# Patient Record
Sex: Female | Born: 1945
Health system: Southern US, Community
[De-identification: ages and names within clinical notes are randomized; demographics above are authoritative.]

## PROBLEM LIST (undated history)

## (undated) DIAGNOSIS — H04129 Dry eye syndrome of unspecified lacrimal gland: Secondary | ICD-10-CM

## (undated) DIAGNOSIS — E063 Autoimmune thyroiditis: Secondary | ICD-10-CM

## (undated) DIAGNOSIS — M19012 Primary osteoarthritis, left shoulder: Secondary | ICD-10-CM

## (undated) DIAGNOSIS — T8859XA Other complications of anesthesia, initial encounter: Secondary | ICD-10-CM

## (undated) DIAGNOSIS — Z5189 Encounter for other specified aftercare: Secondary | ICD-10-CM

## (undated) DIAGNOSIS — Q211 Atrial septal defect: Secondary | ICD-10-CM

## (undated) DIAGNOSIS — Z8679 Personal history of other diseases of the circulatory system: Secondary | ICD-10-CM

## (undated) DIAGNOSIS — F329 Major depressive disorder, single episode, unspecified: Secondary | ICD-10-CM

## (undated) DIAGNOSIS — H04123 Dry eye syndrome of bilateral lacrimal glands: Secondary | ICD-10-CM

## (undated) DIAGNOSIS — K5730 Diverticulosis of large intestine without perforation or abscess without bleeding: Secondary | ICD-10-CM

## (undated) DIAGNOSIS — F411 Generalized anxiety disorder: Secondary | ICD-10-CM

## (undated) DIAGNOSIS — Q219 Congenital malformation of cardiac septum, unspecified: Secondary | ICD-10-CM

## (undated) DIAGNOSIS — M5010 Cervical disc disorder with radiculopathy, unspecified cervical region: Secondary | ICD-10-CM

## (undated) DIAGNOSIS — Z973 Presence of spectacles and contact lenses: Secondary | ICD-10-CM

## (undated) DIAGNOSIS — J3089 Other allergic rhinitis: Secondary | ICD-10-CM

## (undated) DIAGNOSIS — Q2112 Patent foramen ovale: Secondary | ICD-10-CM

## (undated) DIAGNOSIS — R6889 Other general symptoms and signs: Principal | ICD-10-CM

## (undated) DIAGNOSIS — M7542 Impingement syndrome of left shoulder: Secondary | ICD-10-CM

## (undated) DIAGNOSIS — K635 Polyp of colon: Secondary | ICD-10-CM

## (undated) DIAGNOSIS — R21 Rash and other nonspecific skin eruption: Secondary | ICD-10-CM

## (undated) DIAGNOSIS — R7303 Prediabetes: Secondary | ICD-10-CM

## (undated) DIAGNOSIS — T7840XA Allergy, unspecified, initial encounter: Secondary | ICD-10-CM

## (undated) DIAGNOSIS — E039 Hypothyroidism, unspecified: Secondary | ICD-10-CM

## (undated) DIAGNOSIS — M75102 Unspecified rotator cuff tear or rupture of left shoulder, not specified as traumatic: Secondary | ICD-10-CM

## (undated) DIAGNOSIS — J45909 Unspecified asthma, uncomplicated: Secondary | ICD-10-CM

## (undated) DIAGNOSIS — E161 Other hypoglycemia: Secondary | ICD-10-CM

## (undated) DIAGNOSIS — M797 Fibromyalgia: Secondary | ICD-10-CM

## (undated) DIAGNOSIS — G43809 Other migraine, not intractable, without status migrainosus: Secondary | ICD-10-CM

## (undated) DIAGNOSIS — E785 Hyperlipidemia, unspecified: Secondary | ICD-10-CM

## (undated) DIAGNOSIS — M419 Scoliosis, unspecified: Secondary | ICD-10-CM

## (undated) DIAGNOSIS — M25812 Other specified joint disorders, left shoulder: Secondary | ICD-10-CM

## (undated) DIAGNOSIS — F32A Depression, unspecified: Secondary | ICD-10-CM

## (undated) DIAGNOSIS — I10 Essential (primary) hypertension: Secondary | ICD-10-CM

## (undated) DIAGNOSIS — K219 Gastro-esophageal reflux disease without esophagitis: Secondary | ICD-10-CM

## (undated) DIAGNOSIS — Z8711 Personal history of peptic ulcer disease: Secondary | ICD-10-CM

## (undated) DIAGNOSIS — G8929 Other chronic pain: Secondary | ICD-10-CM

## (undated) DIAGNOSIS — F988 Other specified behavioral and emotional disorders with onset usually occurring in childhood and adolescence: Secondary | ICD-10-CM

## (undated) DIAGNOSIS — K439 Ventral hernia without obstruction or gangrene: Secondary | ICD-10-CM

## (undated) HISTORY — PX: APPENDECTOMY: SHX54

## (undated) HISTORY — PX: CHOLECYSTECTOMY: SHX55

## (undated) HISTORY — DX: Encounter for other specified aftercare: Z51.89

## (undated) HISTORY — PX: KNEE ARTHROSCOPY W/ ACL RECONSTRUCTION: SHX1858

## (undated) HISTORY — DX: Fibromyalgia: M79.7

## (undated) HISTORY — DX: Essential (primary) hypertension: I10

## (undated) HISTORY — PX: OTHER SURGICAL HISTORY: SHX169

## (undated) HISTORY — PX: COLONOSCOPY: SHX174

## (undated) HISTORY — DX: Unspecified asthma, uncomplicated: J45.909

## (undated) HISTORY — DX: Allergy, unspecified, initial encounter: T78.40XA

## (undated) HISTORY — PX: UPPER GASTROINTESTINAL ENDOSCOPY: SHX188

## (undated) HISTORY — DX: Hyperlipidemia, unspecified: E78.5

## (undated) HISTORY — DX: Scoliosis, unspecified: M41.9

## (undated) HISTORY — DX: Dry eye syndrome of unspecified lacrimal gland: H04.129

## (undated) HISTORY — PX: JOINT REPLACEMENT: SHX530

## (undated) HISTORY — DX: Other general symptoms and signs: R68.89

## (undated) HISTORY — DX: Diverticulosis of large intestine without perforation or abscess without bleeding: K57.30

## (undated) HISTORY — DX: Gastro-esophageal reflux disease without esophagitis: K21.9

## (undated) HISTORY — PX: ABDOMINAL HYSTERECTOMY: SHX81

## (undated) HISTORY — DX: Polyp of colon: K63.5

---

## 1986-04-11 HISTORY — PX: TOTAL ABDOMINAL HYSTERECTOMY W/ BILATERAL SALPINGOOPHORECTOMY: SHX83

## 1990-04-11 HISTORY — PX: CHOLECYSTECTOMY, LAPAROSCOPIC: SHX56

## 2001-04-11 HISTORY — PX: KNEE ARTHROSCOPY W/ ACL RECONSTRUCTION: SHX1858

## 2004-03-25 ENCOUNTER — Ambulatory Visit: Payer: Self-pay | Admitting: Gastroenterology

## 2004-03-26 ENCOUNTER — Ambulatory Visit: Payer: Self-pay | Admitting: Gastroenterology

## 2004-03-29 ENCOUNTER — Ambulatory Visit: Payer: Self-pay | Admitting: Gastroenterology

## 2004-04-28 ENCOUNTER — Ambulatory Visit (HOSPITAL_COMMUNITY): Admission: RE | Admit: 2004-04-28 | Discharge: 2004-04-28 | Payer: Self-pay | Admitting: "Endocrinology

## 2004-11-08 ENCOUNTER — Other Ambulatory Visit: Admission: RE | Admit: 2004-11-08 | Discharge: 2004-11-08 | Payer: Self-pay | Admitting: Obstetrics and Gynecology

## 2004-11-22 ENCOUNTER — Encounter: Admission: RE | Admit: 2004-11-22 | Discharge: 2004-11-22 | Payer: Self-pay | Admitting: Obstetrics and Gynecology

## 2005-04-28 ENCOUNTER — Emergency Department (HOSPITAL_COMMUNITY): Admission: EM | Admit: 2005-04-28 | Discharge: 2005-04-28 | Payer: Self-pay | Admitting: Family Medicine

## 2005-08-26 ENCOUNTER — Encounter: Admission: RE | Admit: 2005-08-26 | Discharge: 2005-08-26 | Payer: Self-pay | Admitting: Neurology

## 2005-11-24 ENCOUNTER — Other Ambulatory Visit: Admission: RE | Admit: 2005-11-24 | Discharge: 2005-11-24 | Payer: Self-pay | Admitting: Obstetrics and Gynecology

## 2005-11-25 ENCOUNTER — Encounter: Admission: RE | Admit: 2005-11-25 | Discharge: 2005-11-25 | Payer: Self-pay | Admitting: Neurology

## 2005-12-06 ENCOUNTER — Encounter: Admission: RE | Admit: 2005-12-06 | Discharge: 2005-12-06 | Payer: Self-pay | Admitting: "Endocrinology

## 2005-12-15 ENCOUNTER — Encounter: Admission: RE | Admit: 2005-12-15 | Discharge: 2005-12-15 | Payer: Self-pay | Admitting: "Endocrinology

## 2006-02-11 ENCOUNTER — Encounter: Admission: RE | Admit: 2006-02-11 | Discharge: 2006-02-11 | Payer: Self-pay | Admitting: Orthopedic Surgery

## 2006-04-17 ENCOUNTER — Ambulatory Visit (HOSPITAL_BASED_OUTPATIENT_CLINIC_OR_DEPARTMENT_OTHER): Admission: RE | Admit: 2006-04-17 | Discharge: 2006-04-17 | Payer: Self-pay | Admitting: Orthopedic Surgery

## 2006-04-17 HISTORY — PX: KNEE ARTHROSCOPY W/ MENISCAL REPAIR: SHX1877

## 2006-04-25 ENCOUNTER — Ambulatory Visit: Payer: Self-pay

## 2006-08-17 ENCOUNTER — Ambulatory Visit: Payer: Self-pay | Admitting: Internal Medicine

## 2006-12-07 ENCOUNTER — Other Ambulatory Visit: Admission: RE | Admit: 2006-12-07 | Discharge: 2006-12-07 | Payer: Self-pay | Admitting: Obstetrics and Gynecology

## 2007-02-14 ENCOUNTER — Encounter: Admission: RE | Admit: 2007-02-14 | Discharge: 2007-02-14 | Payer: Self-pay | Admitting: "Endocrinology

## 2007-08-30 ENCOUNTER — Encounter: Admission: RE | Admit: 2007-08-30 | Discharge: 2007-08-30 | Payer: Self-pay | Admitting: Endocrinology

## 2007-10-26 ENCOUNTER — Ambulatory Visit: Payer: Self-pay | Admitting: Internal Medicine

## 2007-10-29 ENCOUNTER — Encounter: Admission: RE | Admit: 2007-10-29 | Discharge: 2007-10-29 | Payer: Self-pay | Admitting: "Endocrinology

## 2007-11-08 ENCOUNTER — Telehealth: Payer: Self-pay | Admitting: Internal Medicine

## 2007-11-09 ENCOUNTER — Encounter: Payer: Self-pay | Admitting: Internal Medicine

## 2007-11-09 ENCOUNTER — Ambulatory Visit: Payer: Self-pay | Admitting: Internal Medicine

## 2007-11-09 DIAGNOSIS — K5730 Diverticulosis of large intestine without perforation or abscess without bleeding: Secondary | ICD-10-CM

## 2007-11-09 DIAGNOSIS — K635 Polyp of colon: Secondary | ICD-10-CM

## 2007-11-09 HISTORY — DX: Diverticulosis of large intestine without perforation or abscess without bleeding: K57.30

## 2007-11-09 HISTORY — DX: Polyp of colon: K63.5

## 2007-11-18 ENCOUNTER — Encounter: Payer: Self-pay | Admitting: Internal Medicine

## 2007-12-11 ENCOUNTER — Other Ambulatory Visit: Admission: RE | Admit: 2007-12-11 | Discharge: 2007-12-11 | Payer: Self-pay | Admitting: Obstetrics and Gynecology

## 2008-03-27 ENCOUNTER — Ambulatory Visit: Payer: Self-pay | Admitting: Sports Medicine

## 2008-03-27 DIAGNOSIS — Q6650 Congenital pes planus, unspecified foot: Secondary | ICD-10-CM | POA: Insufficient documentation

## 2008-03-27 DIAGNOSIS — M21619 Bunion of unspecified foot: Secondary | ICD-10-CM | POA: Insufficient documentation

## 2008-03-27 DIAGNOSIS — M1710 Unilateral primary osteoarthritis, unspecified knee: Secondary | ICD-10-CM | POA: Insufficient documentation

## 2008-03-27 DIAGNOSIS — M79609 Pain in unspecified limb: Secondary | ICD-10-CM | POA: Insufficient documentation

## 2008-03-27 DIAGNOSIS — IMO0002 Reserved for concepts with insufficient information to code with codable children: Secondary | ICD-10-CM

## 2008-03-27 DIAGNOSIS — M2170 Unequal limb length (acquired), unspecified site: Secondary | ICD-10-CM | POA: Insufficient documentation

## 2008-03-27 DIAGNOSIS — IMO0001 Reserved for inherently not codable concepts without codable children: Secondary | ICD-10-CM | POA: Insufficient documentation

## 2008-03-28 ENCOUNTER — Encounter: Payer: Self-pay | Admitting: Sports Medicine

## 2008-07-01 ENCOUNTER — Encounter: Payer: Self-pay | Admitting: Internal Medicine

## 2008-07-02 ENCOUNTER — Encounter: Payer: Self-pay | Admitting: Internal Medicine

## 2008-08-15 ENCOUNTER — Encounter (HOSPITAL_COMMUNITY): Admission: RE | Admit: 2008-08-15 | Discharge: 2008-11-12 | Payer: Self-pay | Admitting: Internal Medicine

## 2008-08-19 ENCOUNTER — Encounter: Admission: RE | Admit: 2008-08-19 | Discharge: 2008-08-19 | Payer: Self-pay | Admitting: Orthopedic Surgery

## 2008-08-22 ENCOUNTER — Encounter: Payer: Self-pay | Admitting: Internal Medicine

## 2008-08-30 ENCOUNTER — Encounter: Admission: RE | Admit: 2008-08-30 | Discharge: 2008-08-30 | Payer: Self-pay | Admitting: Neurological Surgery

## 2008-11-28 ENCOUNTER — Encounter: Payer: Self-pay | Admitting: Internal Medicine

## 2008-12-04 ENCOUNTER — Encounter: Payer: Self-pay | Admitting: Internal Medicine

## 2008-12-19 ENCOUNTER — Encounter: Admission: RE | Admit: 2008-12-19 | Discharge: 2008-12-19 | Payer: Self-pay | Admitting: "Endocrinology

## 2008-12-22 ENCOUNTER — Inpatient Hospital Stay (HOSPITAL_COMMUNITY): Admission: RE | Admit: 2008-12-22 | Discharge: 2008-12-25 | Payer: Self-pay | Admitting: Orthopedic Surgery

## 2008-12-22 ENCOUNTER — Ambulatory Visit: Payer: Self-pay | Admitting: Vascular Surgery

## 2008-12-22 HISTORY — PX: TOTAL KNEE ARTHROPLASTY: SHX125

## 2009-04-11 HISTORY — PX: OTHER SURGICAL HISTORY: SHX169

## 2009-04-18 ENCOUNTER — Encounter: Admission: RE | Admit: 2009-04-18 | Discharge: 2009-04-18 | Payer: Self-pay | Admitting: Orthopedic Surgery

## 2009-04-23 ENCOUNTER — Other Ambulatory Visit: Admission: RE | Admit: 2009-04-23 | Discharge: 2009-04-23 | Payer: Self-pay | Admitting: Obstetrics and Gynecology

## 2009-05-04 ENCOUNTER — Ambulatory Visit (HOSPITAL_COMMUNITY)
Admission: RE | Admit: 2009-05-04 | Discharge: 2009-05-04 | Payer: Self-pay | Admitting: Physical Medicine & Rehabilitation

## 2009-05-27 ENCOUNTER — Encounter: Admission: RE | Admit: 2009-05-27 | Discharge: 2009-07-03 | Payer: Self-pay | Admitting: Orthopedic Surgery

## 2009-06-02 ENCOUNTER — Encounter: Admission: RE | Admit: 2009-06-02 | Discharge: 2009-06-02 | Payer: Self-pay | Admitting: Neurological Surgery

## 2009-06-05 ENCOUNTER — Ambulatory Visit (HOSPITAL_BASED_OUTPATIENT_CLINIC_OR_DEPARTMENT_OTHER): Admission: RE | Admit: 2009-06-05 | Discharge: 2009-06-05 | Payer: Self-pay | Admitting: Orthopedic Surgery

## 2009-06-05 HISTORY — PX: SHOULDER ARTHROSCOPY WITH ROTATOR CUFF REPAIR: SHX5685

## 2009-06-10 ENCOUNTER — Emergency Department (HOSPITAL_COMMUNITY): Admission: EM | Admit: 2009-06-10 | Discharge: 2009-06-10 | Payer: Self-pay | Admitting: Emergency Medicine

## 2009-11-10 ENCOUNTER — Encounter: Payer: Self-pay | Admitting: Internal Medicine

## 2009-12-24 ENCOUNTER — Encounter: Admission: RE | Admit: 2009-12-24 | Discharge: 2009-12-24 | Payer: Self-pay | Admitting: Endocrinology

## 2010-04-29 ENCOUNTER — Encounter (HOSPITAL_COMMUNITY)
Admission: RE | Admit: 2010-04-29 | Discharge: 2010-05-11 | Payer: Self-pay | Source: Home / Self Care | Attending: Interventional Cardiology | Admitting: Interventional Cardiology

## 2010-05-02 ENCOUNTER — Encounter: Payer: Self-pay | Admitting: Obstetrics and Gynecology

## 2010-05-03 ENCOUNTER — Other Ambulatory Visit: Payer: Self-pay | Admitting: Obstetrics and Gynecology

## 2010-05-03 ENCOUNTER — Other Ambulatory Visit
Admission: RE | Admit: 2010-05-03 | Discharge: 2010-05-03 | Payer: Self-pay | Source: Home / Self Care | Admitting: Obstetrics and Gynecology

## 2010-05-11 NOTE — Letter (Signed)
Summary: [facility] Endocrinology & Diabetes  [facility] Endocrinology & Diabetes   Imported By: Sherian Rein 11/26/2009 09:45:52  _____________________________________________________________________  External Attachment:    Type:   Image     Comment:   External Document

## 2010-06-30 LAB — POCT HEMOGLOBIN-HEMACUE: Hemoglobin: 12 g/dL (ref 12.0–15.0)

## 2010-07-05 LAB — DIFFERENTIAL
Basophils Absolute: 0 10*3/uL (ref 0.0–0.1)
Basophils Relative: 0 % (ref 0–1)
Eosinophils Absolute: 0 10*3/uL (ref 0.0–0.7)
Eosinophils Relative: 1 % (ref 0–5)
Lymphocytes Relative: 16 % (ref 12–46)
Lymphs Abs: 1.4 10*3/uL (ref 0.7–4.0)
Monocytes Absolute: 0.6 10*3/uL (ref 0.1–1.0)
Monocytes Relative: 7 % (ref 3–12)
Neutro Abs: 6.6 10*3/uL (ref 1.7–7.7)
Neutrophils Relative %: 76 % (ref 43–77)

## 2010-07-05 LAB — CBC
HCT: 37.3 % (ref 36.0–46.0)
Hemoglobin: 12.6 g/dL (ref 12.0–15.0)
MCHC: 33.9 g/dL (ref 30.0–36.0)
MCV: 87 fL (ref 78.0–100.0)
Platelets: 394 10*3/uL (ref 150–400)
RBC: 4.29 MIL/uL (ref 3.87–5.11)
RDW: 14.2 % (ref 11.5–15.5)
WBC: 8.7 10*3/uL (ref 4.0–10.5)

## 2010-07-05 LAB — COMPREHENSIVE METABOLIC PANEL
ALT: 14 U/L (ref 0–35)
AST: 16 U/L (ref 0–37)
Albumin: 3.3 g/dL — ABNORMAL LOW (ref 3.5–5.2)
Alkaline Phosphatase: 67 U/L (ref 39–117)
BUN: 13 mg/dL (ref 6–23)
CO2: 28 mEq/L (ref 19–32)
Calcium: 9.1 mg/dL (ref 8.4–10.5)
Chloride: 98 mEq/L (ref 96–112)
Creatinine, Ser: 0.75 mg/dL (ref 0.4–1.2)
GFR calc Af Amer: >60 mL/min (ref >60)
GFR calc non Af Amer: >60 mL/min (ref >60)
Glucose, Bld: 119 mg/dL — ABNORMAL HIGH (ref 70–99)
Potassium: 3.8 mEq/L (ref 3.5–5.1)
Sodium: 134 mEq/L — ABNORMAL LOW (ref 135–145)
Total Bilirubin: 0.4 mg/dL (ref 0.3–1.2)
Total Protein: 7.6 g/dL (ref 6.0–8.3)

## 2010-07-05 LAB — URINALYSIS, ROUTINE W REFLEX MICROSCOPIC
Glucose, UA: NEGATIVE mg/dL
Hgb urine dipstick: NEGATIVE
Ketones, ur: 15 mg/dL — AB
Nitrite: NEGATIVE
Protein, ur: NEGATIVE mg/dL
Specific Gravity, Urine: 1.037 — ABNORMAL HIGH (ref 1.005–1.030)
Urobilinogen, UA: 1 mg/dL (ref 0.0–1.0)
pH: 5 (ref 5.0–8.0)

## 2010-07-05 LAB — URINE MICROSCOPIC-ADD ON

## 2010-07-05 LAB — LIPASE, BLOOD: Lipase: 24 U/L (ref 11–59)

## 2010-07-16 LAB — BASIC METABOLIC PANEL
BUN: 7 mg/dL (ref 6–23)
BUN: 5 mg/dL — ABNORMAL LOW (ref 6–23)
BUN: 9 mg/dL (ref 6–23)
CO2: 27 mEq/L (ref 19–32)
CO2: 29 mEq/L (ref 19–32)
CO2: 29 mEq/L (ref 19–32)
Calcium: 7.9 mg/dL — ABNORMAL LOW (ref 8.4–10.5)
Calcium: 6.8 mg/dL — ABNORMAL LOW (ref 8.4–10.5)
Calcium: 7.6 mg/dL — ABNORMAL LOW (ref 8.4–10.5)
Chloride: 103 mEq/L (ref 96–112)
Chloride: 105 mEq/L (ref 96–112)
Chloride: 98 mEq/L (ref 96–112)
Creatinine, Ser: 0.66 mg/dL (ref 0.4–1.2)
Creatinine, Ser: 0.66 mg/dL (ref 0.4–1.2)
Creatinine, Ser: 0.76 mg/dL (ref 0.4–1.2)
GFR calc Af Amer: >60 mL/min (ref >60)
GFR calc Af Amer: >60 mL/min (ref >60)
GFR calc Af Amer: >60 mL/min (ref >60)
GFR calc non Af Amer: >60 mL/min (ref >60)
GFR calc non Af Amer: >60 mL/min (ref >60)
GFR calc non Af Amer: >60 mL/min (ref >60)
Glucose, Bld: 127 mg/dL — ABNORMAL HIGH (ref 70–99)
Glucose, Bld: 117 mg/dL — ABNORMAL HIGH (ref 70–99)
Glucose, Bld: 137 mg/dL — ABNORMAL HIGH (ref 70–99)
Potassium: 3.9 mEq/L (ref 3.5–5.1)
Potassium: 3.4 mEq/L — ABNORMAL LOW (ref 3.5–5.1)
Potassium: 4.5 mEq/L (ref 3.5–5.1)
Sodium: 135 mEq/L (ref 135–145)
Sodium: 132 mEq/L — ABNORMAL LOW (ref 135–145)
Sodium: 137 mEq/L (ref 135–145)

## 2010-07-16 LAB — DIFFERENTIAL
Basophils Absolute: 0 10*3/uL (ref 0.0–0.1)
Basophils Absolute: 0 10*3/uL (ref 0.0–0.1)
Basophils Absolute: 0.1 10*3/uL (ref 0.0–0.1)
Basophils Absolute: 0.1 10*3/uL (ref 0.0–0.1)
Basophils Relative: 0 % (ref 0–1)
Basophils Relative: 0 % (ref 0–1)
Basophils Relative: 0 % (ref 0–1)
Basophils Relative: 1 % (ref 0–1)
Eosinophils Absolute: 0.1 10*3/uL (ref 0.0–0.7)
Eosinophils Absolute: 0 10*3/uL (ref 0.0–0.7)
Eosinophils Absolute: 0 10*3/uL (ref 0.0–0.7)
Eosinophils Absolute: 0.2 10*3/uL (ref 0.0–0.7)
Eosinophils Relative: 1 % (ref 0–5)
Eosinophils Relative: 0 % (ref 0–5)
Eosinophils Relative: 0 % (ref 0–5)
Eosinophils Relative: 2 % (ref 0–5)
Lymphocytes Relative: 20 % (ref 12–46)
Lymphocytes Relative: 18 % (ref 12–46)
Lymphocytes Relative: 26 % (ref 12–46)
Lymphocytes Relative: 30 % (ref 12–46)
Lymphs Abs: 2.2 10*3/uL (ref 0.7–4.0)
Lymphs Abs: 2.1 10*3/uL (ref 0.7–4.0)
Lymphs Abs: 2.4 10*3/uL (ref 0.7–4.0)
Lymphs Abs: 3 10*3/uL (ref 0.7–4.0)
Monocytes Absolute: 0.6 10*3/uL (ref 0.1–1.0)
Monocytes Absolute: 0.8 10*3/uL (ref 0.1–1.0)
Monocytes Absolute: 0.9 10*3/uL (ref 0.1–1.0)
Monocytes Absolute: 0.9 10*3/uL (ref 0.1–1.0)
Monocytes Relative: 5 % (ref 3–12)
Monocytes Relative: 7 % (ref 3–12)
Monocytes Relative: 8 % (ref 3–12)
Monocytes Relative: 9 % (ref 3–12)
Neutro Abs: 8.2 10*3/uL — ABNORMAL HIGH (ref 1.7–7.7)
Neutro Abs: 6 10*3/uL (ref 1.7–7.7)
Neutro Abs: 6.1 10*3/uL (ref 1.7–7.7)
Neutro Abs: 9 10*3/uL — ABNORMAL HIGH (ref 1.7–7.7)
Neutrophils Relative %: 74 % (ref 43–77)
Neutrophils Relative %: 60 % (ref 43–77)
Neutrophils Relative %: 65 % (ref 43–77)
Neutrophils Relative %: 75 % (ref 43–77)

## 2010-07-16 LAB — GLUCOSE, CAPILLARY
Glucose-Capillary: 121 mg/dL — ABNORMAL HIGH (ref 70–99)
Glucose-Capillary: 138 mg/dL — ABNORMAL HIGH (ref 70–99)
Glucose-Capillary: 139 mg/dL — ABNORMAL HIGH (ref 70–99)
Glucose-Capillary: 145 mg/dL — ABNORMAL HIGH (ref 70–99)

## 2010-07-16 LAB — CBC
HCT: 26.8 % — ABNORMAL LOW (ref 36.0–46.0)
HCT: 21.8 % — ABNORMAL LOW (ref 36.0–46.0)
HCT: 29.1 % — ABNORMAL LOW (ref 36.0–46.0)
HCT: 39.8 % (ref 36.0–46.0)
Hemoglobin: 9.2 g/dL — ABNORMAL LOW (ref 12.0–15.0)
Hemoglobin: 10 g/dL — ABNORMAL LOW (ref 12.0–15.0)
Hemoglobin: 13.4 g/dL (ref 12.0–15.0)
Hemoglobin: 7.4 g/dL — CL (ref 12.0–15.0)
MCHC: 34.4 g/dL (ref 30.0–36.0)
MCHC: 33.6 g/dL (ref 30.0–36.0)
MCHC: 34.1 g/dL (ref 30.0–36.0)
MCHC: 34.3 g/dL (ref 30.0–36.0)
MCV: 88.8 fL (ref 78.0–100.0)
MCV: 87.5 fL (ref 78.0–100.0)
MCV: 88.3 fL (ref 78.0–100.0)
MCV: 89.5 fL (ref 78.0–100.0)
Platelets: 179 10*3/uL (ref 150–400)
Platelets: 181 10*3/uL (ref 150–400)
Platelets: 197 10*3/uL (ref 150–400)
Platelets: 390 10*3/uL (ref 150–400)
RBC: 3.02 MIL/uL — ABNORMAL LOW (ref 3.87–5.11)
RBC: 2.46 MIL/uL — ABNORMAL LOW (ref 3.87–5.11)
RBC: 3.32 MIL/uL — ABNORMAL LOW (ref 3.87–5.11)
RBC: 4.44 MIL/uL (ref 3.87–5.11)
RDW: 14.6 % (ref 11.5–15.5)
RDW: 13.4 % (ref 11.5–15.5)
RDW: 13.7 % (ref 11.5–15.5)
RDW: 14 % (ref 11.5–15.5)
WBC: 11.1 10*3/uL — ABNORMAL HIGH (ref 4.0–10.5)
WBC: 10.1 10*3/uL (ref 4.0–10.5)
WBC: 12.1 10*3/uL — ABNORMAL HIGH (ref 4.0–10.5)
WBC: 9.3 10*3/uL (ref 4.0–10.5)

## 2010-07-16 LAB — POCT I-STAT 4, (NA,K, GLUC, HGB,HCT)
Glucose, Bld: 159 mg/dL — ABNORMAL HIGH (ref 70–99)
HCT: 27 % — ABNORMAL LOW (ref 36.0–46.0)
Hemoglobin: 9.2 g/dL — ABNORMAL LOW (ref 12.0–15.0)
Potassium: 4 mEq/L (ref 3.5–5.1)
Sodium: 136 mEq/L (ref 135–145)

## 2010-07-16 LAB — COMPREHENSIVE METABOLIC PANEL
ALT: 18 U/L (ref 0–35)
AST: 26 U/L (ref 0–37)
Albumin: 4.3 g/dL (ref 3.5–5.2)
Alkaline Phosphatase: 52 U/L (ref 39–117)
BUN: 22 mg/dL (ref 6–23)
CO2: 29 mEq/L (ref 19–32)
Calcium: 9.9 mg/dL (ref 8.4–10.5)
Chloride: 101 mEq/L (ref 96–112)
Creatinine, Ser: 0.9 mg/dL (ref 0.4–1.2)
GFR calc Af Amer: >60 mL/min (ref >60)
GFR calc non Af Amer: >60 mL/min (ref >60)
Glucose, Bld: 92 mg/dL (ref 70–99)
Potassium: 4.1 mEq/L (ref 3.5–5.1)
Sodium: 138 mEq/L (ref 135–145)
Total Bilirubin: 0.7 mg/dL (ref 0.3–1.2)
Total Protein: 7.5 g/dL (ref 6.0–8.3)

## 2010-07-16 LAB — URINE CULTURE: Colony Count: 50000

## 2010-07-16 LAB — URINALYSIS, ROUTINE W REFLEX MICROSCOPIC
Bilirubin Urine: NEGATIVE
Glucose, UA: NEGATIVE mg/dL
Hgb urine dipstick: NEGATIVE
Ketones, ur: NEGATIVE mg/dL
Nitrite: NEGATIVE
Protein, ur: NEGATIVE mg/dL
Specific Gravity, Urine: 1.013 (ref 1.005–1.030)
Urobilinogen, UA: 0.2 mg/dL (ref 0.0–1.0)
pH: 5.5 (ref 5.0–8.0)

## 2010-07-16 LAB — PREPARE RBC (CROSSMATCH)

## 2010-07-16 LAB — PROTIME-INR
INR: 2 — ABNORMAL HIGH (ref 0.00–1.49)
INR: 0.9 (ref 0.00–1.49)
INR: 1.2 (ref 0.00–1.49)
INR: 2.1 — ABNORMAL HIGH (ref 0.00–1.49)
Prothrombin Time: 22.9 seconds — ABNORMAL HIGH (ref 11.6–15.2)
Prothrombin Time: 12.1 seconds (ref 11.6–15.2)
Prothrombin Time: 15.3 seconds — ABNORMAL HIGH (ref 11.6–15.2)
Prothrombin Time: 23.7 seconds — ABNORMAL HIGH (ref 11.6–15.2)

## 2010-07-16 LAB — TYPE AND SCREEN
ABO/RH(D): O POS
Antibody Screen: NEGATIVE

## 2010-07-16 LAB — APTT: aPTT: 28 seconds (ref 24–37)

## 2010-07-16 LAB — ABO/RH: ABO/RH(D): O POS

## 2010-08-24 NOTE — Assessment & Plan Note (Signed)
[facility]                         GASTROENTEROLOGY OFFICE NOTE   NAME:BRENNANShanay, Woolman                     MRN:          [id number]  DATE:08/17/2006                            DOB:          04-Feb-1946    Patient is self-referred.   REASON FOR CONSULTATION:  Epigastric discomfort and nausea.   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  I know her personally  in her assistance with my daughter.  She presents now regarding the  above-stated issues.  Her problems date back at least five years.  She  was evaluated by Dr. Sheryn Bison in our office, March 25, 2004,  for nausea, epigastric pain, gas, bloating and constipation.  I have  reviewed that encounter.  At that time, testing for celiac disease  returned normal.  An abdominal ultrasound was normal, post-  cholecystectomy.  Upper endoscopy revealed a small sliding hiatal hernia  only.  Biopsies of the normal-appearing gastric mucosa revealed  histologic changes of chronic gastritis with numerous Helicobacter  pylori.  For this, she was treated with PrevPak.  Patient reports to me  a remote history of gastric polyps in either 1999 or 2000.  She has had  chronic intermittent problems with constipation, which persists.  She  describes her stools as dry with a dog-food-like consistency.   She carries the diagnoses of adult attention deficit disorder,  dyslipidemia, and fibromyalgia.  In short, she has had chronic nausea  for years.  This seems to be worse at the end of the day.  No vomiting,  though she does have difficulty swallowing vitamins, which may lead to  regurgitation.  She has been under a great deal of stress at work and  feels this may be a factor.  She did have transient  weight-loss of 8-10  pounds, though has been able to gain back 6 pounds more recently.  There  is a significant component of bloating.  Her symptoms have been  refractory to multiple courses of proton pump inhibitor therapy,  including Nexium.  No fevers, jaundice, melena or hematochezia.  No  diarrhea.   She is on a number of medications, some of which she has been on long-  term.  More recently, Daytrana patch.   PAST MEDICAL HISTORY:  1. Adult ADD.  2. Fibromyalgia.  3. Osteoarthritis.   PAST SURGICAL HISTORY:  1. Cholecystectomy.  2. Hysterectomy.  3. Tubal ligation.  4. Appendectomy.  5. Right-knee arthroscopy this past January.   ALLERGIES:  Multiple allergies or drug intolerances listed, including  MORPHINE, FENTANYL, PRILOSEC, DEMEROL, PERCOCET, CODEINE, WELLBUTRIN,  LAMICTAL and TOPAMAX.   CURRENT MEDICATIONS:  1. Premarin 0.3 mg p.o. daily.  2. Pravachol 40 mg p.o. daily.  3. Tricor 145 mg p.o. daily.  4. Trazodone 150 mg p.o. q.h.s.  5. Tramadol 50 mg 4-6 per day.  6. Aspirin 81 mg daily.  7. Nexium 40 mg daily.  8. Daytrana patch  15 mg daily, as well as 10 mg p.r.n.  9. Restasis b.i.d.  10.Mobic.  11.Synthroid 25 micrograms daily.  12.Gaviscon p.r.n.  13.Tylenol Arthritis p.r.n.  14.Imitrex p.r.n.   FAMILY HISTORY:  As previously outlined, is remarkable for colon cancer  in two grand-uncles, as well as colon polyps in another, as well as her  father.  Also prostate cancer in an uncle and ovarian cancer in her  mother.   SOCIAL HISTORY:  Patient is married to Dr. Molli Knock, with whom  she lives.  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  She  does not smoke and uses alcohol socially.   PHYSICAL EXAM:  Well-appearing female, in no acute distress.  Blood  pressure is 102/64, heart rate 70 and regular, weight is 132.4 pounds  (no significant change from December  2005).  HEENT:  Sclerae are anicteric, conjunctivae are pink.  Oral mucosa is  intact.  There is no adenopathy.  Thyroid is normal.  LUNGS:  Clear.  ABDOMEN:  Soft, without significant tenderness.  There is no mass, no  organomegaly.  Good bowel sounds heard, no succussion splash.  EXTREMITIES:  Without edema.   IMPRESSION:  1. Chronic nausea and epigastric discomfort:  This is almost certainly      functional or possibly medication-related.  In terms of her      medications, Tramadol would be the most likely offender.  In terms      of functional causes, she is certainly under a great deal of      stress.  She did inquire about the possibility of Helicobacter      pylori contributing.  I explained that Helicobacter as a cause for      chronic nausea is quite questionable.  Furthermore, with her prior      course of treatment, she has approximately 95-plus percent chance      of bacterium eradication.  2. Chronic constipation, ongoing, possibly exacerbated by medications.      Again, Tramadol most likely.  3. History of adult ADD.  4. Work-related stress with possible anxiety and depression.  5. History of fibromyalgia.   RECOMMENDATIONS:  1. Consider alternative medication to Tramadol.  2. Encouraged to work with her psychiatrist regarding issues of stress      and possible anxiety with depression.  3. Schedule colonoscopy, given remote history of colon polyps and      family history of colorectal neoplasia.  4. Schedule upper endoscopy concurrently to evaluate nausea and      epigastric discomfort; as well, assure eradication of Helicobacter      pylori.  The nature of both procedures, as well as the risks and      alternatives, have been reviewed.  She understood and agreed to      proceed.  Patient will call back at her convenience, after she      knows her schedule, regarding the scheduling of her examinations. 5. Obtain outside laboratories from earlier this year for  review.   ADDENDUM:  Outside laboratories, obtained April 13, 2006, revealed a  normal CBC, sedimentation rate, comprehensive metabolic panel, and  thyroid testing.     Wilhemina Bonito. Marina Goodell, MD     JNP/MedQ  DD: 08/25/2006  DT: 08/25/2006  Job #: [id number]   cc:   Veverly Fells. [place], M.D.  David Stall, M.D.

## 2010-08-25 ENCOUNTER — Other Ambulatory Visit (HOSPITAL_COMMUNITY): Payer: Self-pay | Admitting: Neurological Surgery

## 2010-08-25 DIAGNOSIS — M4306 Spondylolysis, lumbar region: Secondary | ICD-10-CM

## 2010-08-27 NOTE — Op Note (Signed)
Rita Gregory            ACCOUNT NO.:  [id number]   MEDICAL RECORD NO.:  [id number]          PATIENT TYPE:  AMB   LOCATION:  DSC                          FACILITY:  [facility]   PHYSICIAN:  Robert A. Thurston Hole, M.D. DATE OF BIRTH:  1946/02/05   DATE OF PROCEDURE:  04/17/2006  DATE OF DISCHARGE:                               OPERATIVE REPORT   PREOPERATIVE DIAGNOSES:  1. Right knee medial and lateral meniscal tears.  2. Right knee medial compartment degenerative joint disease.  3. Right knee anterior cruciate ligament graft tear/deficiency.   POSTOPERATIVE DIAGNOSES:  1. Right knee medial and lateral meniscal tears.  2. Right knee medial compartment degenerative joint disease.  3. Right knee anterior cruciate ligament graft tear/deficiency.   PROCEDURES:  1. Right knee examination under anesthesia, followed by arthroscopic      partial medial and lateral meniscectomies.  2. Right knee chondroplasty with partial synovectomy.   SURGEON:  Elana Alm. Thurston Hole, M.D.   ASSISTANT:  Julien Girt, P.A.   ANESTHESIA:  Local with MAC.   OPERATIVE TIME:  30 minutes.   COMPLICATIONS:  None.   INDICATIONS FOR PROCEDURES:  Rita Gregory is a 65 year old woman, who  has had significant right knee pain for the past  6 to 9 months,  increasing in nature.  She had undergone an ACL reconstruction  approximately 4 years ago.  She has had persistent pain, more so than  instability, and exams and MRI have revealed varus degenerative changes,  as well as meniscal tearing and possible ACL graft tear.  She is now to  undergo arthroscopy.   DESCRIPTION:  Rita Gregory was brought to the operating room on April 17, 2006, after a knee block was placed in the holding room by  Anesthesia.  She was placed on the operating table in a supine position.  Her right knee was examined.  She had a full range of motion.  She had  2+ Lachman.  The knee was stable to varus, valgus and posterior stress,  with normal patellar tracking.  The right leg was prepped using sterile  DuraPrep and draped using sterile technique.  Originally, through an  anterolateral portal, the arthroscope with a pump attached was placed,  and through an anteromedial portal an arthroscopic probe was placed.  On  initial inspection of the medial compartment, she had 50% grade 4, and  the rest grade 3 chondromalacia and DJD, which was debrided.  She had a  medial meniscal remnant, of which 50% remained and another 50% of this  was torn, and this was resected back to a stable rim.  The intercondylar  notch was inspected, and her ACL graft showed only minimal fibers still  in position, with 4 to 5 mm of anterior laxity, and this was partially  debrided.  Her PCL was intact and stable.  Her lateral compartment  showed minimal degenerative changes, only grade 1 to 2 chondromalacia.  The lateral meniscus showed a partial tear of 15% of the posterolateral  corner, which was resected back to a stable rim.  The patellofemoral  joint showed mostly grade 1 and  2 chondromalacia, 10 to 15% grade 3  changes, and this was debrided.  The patella tracked normally.  Moderate  synovitis of the medial and lateral gutters was debrided, otherwise,  they were free of pathology.  After this was done, it was felt that all  pathology had been satisfactorily addressed.  The instruments were  removed.  The portals were closed with 3-0 nylon suture and injected  with 0.25% Marcaine with epinephrine and 4 mg of morphine.  Sterile  dressings were applied and the patient awakened and taken to the  recovery room in stable condition.   FOLLOWUP CARE:  Rita Gregory will be followed as an outpatient on Lortab  for pain, and I will see her back in the office in a week for sutures  out and followup.      Robert A. Thurston Hole, M.D.  Electronically Signed     RAW/MEDQ  D:  04/17/2006  T:  04/17/2006  Job:  [id number]

## 2010-10-08 ENCOUNTER — Other Ambulatory Visit (HOSPITAL_COMMUNITY): Payer: Self-pay

## 2010-10-08 ENCOUNTER — Inpatient Hospital Stay (HOSPITAL_COMMUNITY): Admission: RE | Admit: 2010-10-08 | Payer: Self-pay | Source: Ambulatory Visit

## 2011-04-20 DIAGNOSIS — F068 Other specified mental disorders due to known physiological condition: Secondary | ICD-10-CM | POA: Diagnosis not present

## 2011-04-20 DIAGNOSIS — F329 Major depressive disorder, single episode, unspecified: Secondary | ICD-10-CM | POA: Diagnosis not present

## 2011-04-20 DIAGNOSIS — F3289 Other specified depressive episodes: Secondary | ICD-10-CM | POA: Diagnosis not present

## 2011-04-20 DIAGNOSIS — IMO0001 Reserved for inherently not codable concepts without codable children: Secondary | ICD-10-CM | POA: Diagnosis not present

## 2011-04-20 DIAGNOSIS — F909 Attention-deficit hyperactivity disorder, unspecified type: Secondary | ICD-10-CM | POA: Diagnosis not present

## 2011-05-26 ENCOUNTER — Other Ambulatory Visit: Payer: Self-pay | Admitting: Obstetrics and Gynecology

## 2011-05-26 ENCOUNTER — Other Ambulatory Visit (HOSPITAL_COMMUNITY)
Admission: RE | Admit: 2011-05-26 | Discharge: 2011-05-26 | Disposition: A | Discharge status: Home / Self Care | Payer: 59 | Source: Ambulatory Visit | Attending: Obstetrics and Gynecology | Admitting: Obstetrics and Gynecology

## 2011-05-26 DIAGNOSIS — R6882 Decreased libido: Secondary | ICD-10-CM | POA: Diagnosis not present

## 2011-05-26 DIAGNOSIS — Z01419 Encounter for gynecological examination (general) (routine) without abnormal findings: Secondary | ICD-10-CM | POA: Diagnosis not present

## 2011-05-26 DIAGNOSIS — N951 Menopausal and female climacteric states: Secondary | ICD-10-CM | POA: Diagnosis not present

## 2011-06-15 DIAGNOSIS — L570 Actinic keratosis: Secondary | ICD-10-CM | POA: Diagnosis not present

## 2011-06-15 DIAGNOSIS — L820 Inflamed seborrheic keratosis: Secondary | ICD-10-CM | POA: Diagnosis not present

## 2011-06-15 DIAGNOSIS — D239 Other benign neoplasm of skin, unspecified: Secondary | ICD-10-CM | POA: Diagnosis not present

## 2011-08-03 ENCOUNTER — Other Ambulatory Visit: Payer: Self-pay | Admitting: "Endocrinology

## 2011-08-03 DIAGNOSIS — R5381 Other malaise: Secondary | ICD-10-CM | POA: Diagnosis not present

## 2011-08-03 DIAGNOSIS — E782 Mixed hyperlipidemia: Secondary | ICD-10-CM | POA: Diagnosis not present

## 2011-08-03 DIAGNOSIS — R7309 Other abnormal glucose: Secondary | ICD-10-CM | POA: Diagnosis not present

## 2011-08-03 DIAGNOSIS — R29898 Other symptoms and signs involving the musculoskeletal system: Secondary | ICD-10-CM | POA: Diagnosis not present

## 2011-08-03 DIAGNOSIS — M6281 Muscle weakness (generalized): Secondary | ICD-10-CM | POA: Diagnosis not present

## 2011-08-03 DIAGNOSIS — E038 Other specified hypothyroidism: Secondary | ICD-10-CM | POA: Diagnosis not present

## 2011-08-05 DIAGNOSIS — E785 Hyperlipidemia, unspecified: Secondary | ICD-10-CM | POA: Diagnosis not present

## 2011-08-05 DIAGNOSIS — R5382 Chronic fatigue, unspecified: Secondary | ICD-10-CM | POA: Diagnosis not present

## 2011-08-05 DIAGNOSIS — G894 Chronic pain syndrome: Secondary | ICD-10-CM | POA: Diagnosis not present

## 2011-08-05 DIAGNOSIS — I10 Essential (primary) hypertension: Secondary | ICD-10-CM | POA: Diagnosis not present

## 2011-08-05 DIAGNOSIS — E559 Vitamin D deficiency, unspecified: Secondary | ICD-10-CM | POA: Diagnosis not present

## 2011-08-05 DIAGNOSIS — R7309 Other abnormal glucose: Secondary | ICD-10-CM | POA: Diagnosis not present

## 2011-08-05 DIAGNOSIS — G9332 Myalgic encephalomyelitis/chronic fatigue syndrome: Secondary | ICD-10-CM | POA: Diagnosis not present

## 2011-08-05 DIAGNOSIS — E039 Hypothyroidism, unspecified: Secondary | ICD-10-CM | POA: Diagnosis not present

## 2011-08-10 LAB — MYASTHENIA GRAVIS PANEL 2
Acetylcholine Rec Binding: <0.3 nmol/L
Acetylcholine Rec Mod Ab: 17 %
Aceytlcholine Rec Bloc Ab: <15 % of inhibition (ref <15)

## 2011-09-01 ENCOUNTER — Other Ambulatory Visit: Payer: Self-pay | Admitting: Endocrinology

## 2011-09-01 DIAGNOSIS — Z1231 Encounter for screening mammogram for malignant neoplasm of breast: Secondary | ICD-10-CM

## 2011-09-09 ENCOUNTER — Ambulatory Visit
Admission: RE | Admit: 2011-09-09 | Discharge: 2011-09-09 | Disposition: A | Discharge status: Home / Self Care | Payer: 59 | Source: Ambulatory Visit | Attending: Endocrinology | Admitting: Endocrinology

## 2011-09-09 DIAGNOSIS — Z1231 Encounter for screening mammogram for malignant neoplasm of breast: Secondary | ICD-10-CM

## 2011-09-15 DIAGNOSIS — M47817 Spondylosis without myelopathy or radiculopathy, lumbosacral region: Secondary | ICD-10-CM | POA: Diagnosis not present

## 2011-09-16 ENCOUNTER — Other Ambulatory Visit (HOSPITAL_COMMUNITY): Payer: Self-pay | Admitting: Neurological Surgery

## 2011-09-16 ENCOUNTER — Other Ambulatory Visit: Payer: Self-pay | Admitting: Neurological Surgery

## 2011-09-16 DIAGNOSIS — M542 Cervicalgia: Secondary | ICD-10-CM

## 2011-09-16 DIAGNOSIS — M47816 Spondylosis without myelopathy or radiculopathy, lumbar region: Secondary | ICD-10-CM

## 2011-09-16 DIAGNOSIS — M546 Pain in thoracic spine: Secondary | ICD-10-CM

## 2011-09-26 ENCOUNTER — Encounter (HOSPITAL_COMMUNITY): Payer: Self-pay | Admitting: Pharmacy Technician

## 2011-09-28 ENCOUNTER — Ambulatory Visit (HOSPITAL_COMMUNITY)
Admission: RE | Admit: 2011-09-28 | Discharge: 2011-09-28 | Disposition: A | Discharge status: Home / Self Care | Payer: 59 | Source: Ambulatory Visit | Attending: Neurological Surgery | Admitting: Neurological Surgery

## 2011-09-28 DIAGNOSIS — M4120 Other idiopathic scoliosis, site unspecified: Secondary | ICD-10-CM | POA: Diagnosis not present

## 2011-09-28 DIAGNOSIS — M549 Dorsalgia, unspecified: Secondary | ICD-10-CM | POA: Diagnosis not present

## 2011-09-28 DIAGNOSIS — M51379 Other intervertebral disc degeneration, lumbosacral region without mention of lumbar back pain or lower extremity pain: Secondary | ICD-10-CM | POA: Insufficient documentation

## 2011-09-28 DIAGNOSIS — M5137 Other intervertebral disc degeneration, lumbosacral region: Secondary | ICD-10-CM | POA: Insufficient documentation

## 2011-09-28 DIAGNOSIS — M48061 Spinal stenosis, lumbar region without neurogenic claudication: Secondary | ICD-10-CM | POA: Diagnosis not present

## 2011-09-28 DIAGNOSIS — M47816 Spondylosis without myelopathy or radiculopathy, lumbar region: Secondary | ICD-10-CM

## 2011-09-28 DIAGNOSIS — M47817 Spondylosis without myelopathy or radiculopathy, lumbosacral region: Secondary | ICD-10-CM | POA: Insufficient documentation

## 2011-09-28 DIAGNOSIS — IMO0002 Reserved for concepts with insufficient information to code with codable children: Secondary | ICD-10-CM | POA: Diagnosis not present

## 2011-09-28 DIAGNOSIS — M546 Pain in thoracic spine: Secondary | ICD-10-CM

## 2011-09-28 DIAGNOSIS — M47812 Spondylosis without myelopathy or radiculopathy, cervical region: Secondary | ICD-10-CM | POA: Diagnosis not present

## 2011-09-28 DIAGNOSIS — M5030 Other cervical disc degeneration, unspecified cervical region: Secondary | ICD-10-CM | POA: Diagnosis not present

## 2011-09-28 DIAGNOSIS — M542 Cervicalgia: Secondary | ICD-10-CM

## 2011-09-28 DIAGNOSIS — M5124 Other intervertebral disc displacement, thoracic region: Secondary | ICD-10-CM | POA: Diagnosis not present

## 2011-09-28 DIAGNOSIS — M25519 Pain in unspecified shoulder: Secondary | ICD-10-CM | POA: Diagnosis not present

## 2011-09-28 LAB — GLUCOSE, CAPILLARY: Glucose-Capillary: 99 mg/dL (ref 70–99)

## 2011-09-28 MED ORDER — IOHEXOL 300 MG/ML  SOLN
10.0000 mL | Freq: Once | INTRAMUSCULAR | Status: AC | PRN
  Administered 2011-09-28: 10 mL via INTRATHECAL

## 2011-09-28 MED ORDER — HYDROCODONE-ACETAMINOPHEN 5-325 MG PO TABS
ORAL_TABLET | ORAL | Status: AC
  Administered 2011-09-28: 1
  Filled 2011-09-28: qty 1

## 2011-09-28 MED ORDER — ONDANSETRON HCL 4 MG/2ML IJ SOLN: 4.0000 mg | Freq: Four times a day (QID) | INTRAMUSCULAR | Status: DC | PRN

## 2011-09-28 MED ORDER — HYDROCODONE-ACETAMINOPHEN 5-325 MG PO TABS
1.0000 | ORAL_TABLET | ORAL | Status: DC | PRN
  Administered 2011-09-28: 1 via ORAL

## 2011-09-28 MED ORDER — DIAZEPAM 5 MG PO TABS
10.0000 mg | ORAL_TABLET | Freq: Once | ORAL | Status: AC
  Administered 2011-09-28: 10 mg via ORAL

## 2011-09-28 MED ORDER — DIAZEPAM 5 MG PO TABS
ORAL_TABLET | ORAL | Status: AC
  Administered 2011-09-28: 10 mg via ORAL
  Filled 2011-09-28: qty 2

## 2011-09-28 MED ORDER — HYDROCODONE-ACETAMINOPHEN 5-325 MG PO TABS
ORAL_TABLET | ORAL | Status: AC
  Filled 2011-09-28: qty 1

## 2011-09-28 NOTE — Progress Notes (Signed)
Myelogram and medication discharge instructions given.  Patient and spouse voice understanding.

## 2011-09-28 NOTE — Procedures (Signed)
Pre op Dx: Idiopathic scoliosis with myelopathy and radiculopathy, cervical and lumbar stenosis Post op Dx: Idiopathic scoliosis with myelopathy and radiculopathy cervical and lumbar stenosis Procedure: Total myelogram Surgeon: Danielle Dess Puncture level: L2-3 Fluid color: Clear colorless Injection: 9 cc iohexol 300 Findings: Idiopathic lumbar scoliosis with lateral recess stenosis at multiple levels. Cervical portion to follow. CT scan to follow.

## 2011-09-28 NOTE — Discharge Instructions (Signed)

## 2011-10-19 DIAGNOSIS — M4120 Other idiopathic scoliosis, site unspecified: Secondary | ICD-10-CM | POA: Diagnosis not present

## 2011-10-24 DIAGNOSIS — M545 Low back pain, unspecified: Secondary | ICD-10-CM | POA: Diagnosis not present

## 2011-10-24 DIAGNOSIS — M5430 Sciatica, unspecified side: Secondary | ICD-10-CM | POA: Diagnosis not present

## 2011-10-25 ENCOUNTER — Other Ambulatory Visit: Payer: Self-pay | Admitting: *Deleted

## 2011-10-25 DIAGNOSIS — M1990 Unspecified osteoarthritis, unspecified site: Secondary | ICD-10-CM

## 2011-10-25 DIAGNOSIS — E038 Other specified hypothyroidism: Secondary | ICD-10-CM

## 2011-10-25 LAB — CK: Total CK: 69 U/L (ref 7–177)

## 2011-10-25 LAB — T3, FREE: T3, Free: 2.7 pg/mL (ref 2.3–4.2)

## 2011-10-28 DIAGNOSIS — E039 Hypothyroidism, unspecified: Secondary | ICD-10-CM | POA: Diagnosis not present

## 2011-10-28 DIAGNOSIS — E785 Hyperlipidemia, unspecified: Secondary | ICD-10-CM | POA: Diagnosis not present

## 2011-10-28 DIAGNOSIS — G894 Chronic pain syndrome: Secondary | ICD-10-CM | POA: Diagnosis not present

## 2011-10-28 DIAGNOSIS — E559 Vitamin D deficiency, unspecified: Secondary | ICD-10-CM | POA: Diagnosis not present

## 2011-10-28 DIAGNOSIS — I10 Essential (primary) hypertension: Secondary | ICD-10-CM | POA: Diagnosis not present

## 2011-10-28 DIAGNOSIS — G9332 Myalgic encephalomyelitis/chronic fatigue syndrome: Secondary | ICD-10-CM | POA: Diagnosis not present

## 2011-10-28 DIAGNOSIS — R5382 Chronic fatigue, unspecified: Secondary | ICD-10-CM | POA: Diagnosis not present

## 2011-10-28 DIAGNOSIS — R7309 Other abnormal glucose: Secondary | ICD-10-CM | POA: Diagnosis not present

## 2011-11-02 DIAGNOSIS — IMO0001 Reserved for inherently not codable concepts without codable children: Secondary | ICD-10-CM | POA: Diagnosis not present

## 2011-11-02 DIAGNOSIS — F068 Other specified mental disorders due to known physiological condition: Secondary | ICD-10-CM | POA: Diagnosis not present

## 2011-11-02 DIAGNOSIS — F909 Attention-deficit hyperactivity disorder, unspecified type: Secondary | ICD-10-CM | POA: Diagnosis not present

## 2011-11-02 DIAGNOSIS — F3289 Other specified depressive episodes: Secondary | ICD-10-CM | POA: Diagnosis not present

## 2011-11-02 DIAGNOSIS — F329 Major depressive disorder, single episode, unspecified: Secondary | ICD-10-CM | POA: Diagnosis not present

## 2011-11-04 DIAGNOSIS — M5430 Sciatica, unspecified side: Secondary | ICD-10-CM | POA: Diagnosis not present

## 2011-11-04 DIAGNOSIS — M545 Low back pain, unspecified: Secondary | ICD-10-CM | POA: Diagnosis not present

## 2011-11-11 DIAGNOSIS — M545 Low back pain, unspecified: Secondary | ICD-10-CM | POA: Diagnosis not present

## 2011-11-11 DIAGNOSIS — M5430 Sciatica, unspecified side: Secondary | ICD-10-CM | POA: Diagnosis not present

## 2011-12-14 DIAGNOSIS — M545 Low back pain, unspecified: Secondary | ICD-10-CM | POA: Diagnosis not present

## 2011-12-14 DIAGNOSIS — M5430 Sciatica, unspecified side: Secondary | ICD-10-CM | POA: Diagnosis not present

## 2011-12-21 DIAGNOSIS — L819 Disorder of pigmentation, unspecified: Secondary | ICD-10-CM | POA: Diagnosis not present

## 2011-12-21 DIAGNOSIS — D239 Other benign neoplasm of skin, unspecified: Secondary | ICD-10-CM | POA: Diagnosis not present

## 2011-12-22 DIAGNOSIS — M545 Low back pain, unspecified: Secondary | ICD-10-CM | POA: Diagnosis not present

## 2011-12-22 DIAGNOSIS — M5430 Sciatica, unspecified side: Secondary | ICD-10-CM | POA: Diagnosis not present

## 2012-02-07 DIAGNOSIS — M545 Low back pain, unspecified: Secondary | ICD-10-CM | POA: Diagnosis not present

## 2012-02-07 DIAGNOSIS — M5430 Sciatica, unspecified side: Secondary | ICD-10-CM | POA: Diagnosis not present

## 2012-02-10 ENCOUNTER — Telehealth: Payer: Self-pay | Admitting: Internal Medicine

## 2012-02-10 NOTE — Telephone Encounter (Signed)
Pt is having problems with nausea, reflux, and epigastric pain. Saw Dr. Marina Goodell in the past and had an EGD done in 2009. Pt is requesting to be seen, she returns from out of town Tuesday night. Pt scheduled to see Mike Gip PA Wednesday 02/15/12@10am . Pts husband will notify her of appt date and time.

## 2012-02-14 ENCOUNTER — Encounter: Payer: Self-pay | Admitting: *Deleted

## 2012-02-15 ENCOUNTER — Ambulatory Visit (INDEPENDENT_AMBULATORY_CARE_PROVIDER_SITE_OTHER): Payer: 59 | Admitting: Physician Assistant

## 2012-02-15 ENCOUNTER — Encounter: Payer: Self-pay | Admitting: Physician Assistant

## 2012-02-15 VITALS — Ht 59.75 in | Wt 154.6 lb

## 2012-02-15 DIAGNOSIS — R1013 Epigastric pain: Secondary | ICD-10-CM

## 2012-02-15 DIAGNOSIS — K5730 Diverticulosis of large intestine without perforation or abscess without bleeding: Secondary | ICD-10-CM

## 2012-02-15 DIAGNOSIS — K219 Gastro-esophageal reflux disease without esophagitis: Secondary | ICD-10-CM | POA: Diagnosis not present

## 2012-02-15 DIAGNOSIS — Z860100 Personal history of colon polyps, unspecified: Secondary | ICD-10-CM

## 2012-02-15 DIAGNOSIS — R110 Nausea: Secondary | ICD-10-CM | POA: Diagnosis not present

## 2012-02-15 DIAGNOSIS — K5790 Diverticulosis of intestine, part unspecified, without perforation or abscess without bleeding: Secondary | ICD-10-CM

## 2012-02-15 MED ORDER — SUCRALFATE 1 GM/10ML PO SUSP: ORAL | Status: DC

## 2012-02-15 NOTE — Patient Instructions (Addendum)
Per patient request, patient will call us back when she is able to schedule an upper endoscopy with Dr. Marina Goodell   We have sent the following medication to your pharmacy for you to pick up at your convenience: Carafate. Please take three times a day between meals   Please stay on your Prevacid one capsule twice daily   Per patient request would like to get blood drawn where she works with [employer]. Orders given.

## 2012-02-16 ENCOUNTER — Telehealth: Payer: Self-pay | Admitting: Internal Medicine

## 2012-02-16 ENCOUNTER — Other Ambulatory Visit: Payer: Self-pay | Admitting: *Deleted

## 2012-02-16 ENCOUNTER — Encounter: Payer: Self-pay | Admitting: Physician Assistant

## 2012-02-16 DIAGNOSIS — K219 Gastro-esophageal reflux disease without esophagitis: Secondary | ICD-10-CM | POA: Insufficient documentation

## 2012-02-16 DIAGNOSIS — K5790 Diverticulosis of intestine, part unspecified, without perforation or abscess without bleeding: Secondary | ICD-10-CM | POA: Insufficient documentation

## 2012-02-16 DIAGNOSIS — Z860100 Personal history of colon polyps, unspecified: Secondary | ICD-10-CM | POA: Insufficient documentation

## 2012-02-16 MED ORDER — ONDANSETRON HCL 4 MG PO TABS: ORAL_TABLET | ORAL | Status: DC

## 2012-02-16 NOTE — Telephone Encounter (Signed)
Called Cathlyn Parsons to review the case before I call the pt; he will let me know.

## 2012-02-16 NOTE — Telephone Encounter (Signed)
No. We will keep the case in the [facility]. Let her know that we have an anesthesia specialist now working with [place] now and they can address her allergy issues. If, after that review with the anesthesia specialist, propofol is not an option for some reason (however for her I think it is better), then we can use the same agents as we didn't previously. We have the option to do either.

## 2012-02-16 NOTE — Telephone Encounter (Signed)
Pt saw Mike Gip, PA yesterday and it was decided she will have an EGD with Propofol with Dr Marina Goodell. She has many allergies and is scheduled with Propofol. Pt is going out of the country from 03/12/12 thru 03/26/12 so we are limited on days to schedule her. She wanted to be done prior to her trip and stated she would have it done by any doc here, but I explained our policy of changing providers. As of now,  she is scheduled for 03/27/12 in the [facility]. We discussed her allergies and the order for propofol and pt states she is uncomfortable having it done in the [facility] " without a crash cart'. She states before Dr Marina Goodell used Versed and a little Benadryl and she did fine; pt is allergic to Fentanyl. Dr Marina Goodell, would you like for me to move this to the hospital and if so, next week? Thanks,

## 2012-02-16 NOTE — Progress Notes (Signed)
Agree with initial assessment and plans 

## 2012-02-16 NOTE — Progress Notes (Signed)
Subjective:    Patient ID: Rita Gregory, female    DOB: 09-10-1945, 66 y.o.   MRN: [id number]  HPI Rita Gregory is a pleasant 66 year old female known to Dr. Marina Goodell with history of GERD. She underwent upper endoscopy in 2009 which was negative. She also had colonoscopy in July of 2009 with finding of a 12 mm hyperplastic polyp and diverticulosis. She is to have a 5 year followup. This time she comes in with complaints of 1 month history of subxiphoid burning and pressure as well as some epigastric discomfort and nausea. She says she had started having these symptoms but then after that had an intestinal virus which was associated with nausea and no abdominal discomfort diarrhea and vertigo. Without illness her nausea became worse as did her reflux symptoms. She says she had 2 out of town on a trip last week and her symptoms were terrible. She's been on Nexium 40 mg by mouth daily long-term, she added Zantac twice daily but even with that she was having ongoing burning and discomfort all day long as well as nausea. Her husband who is a physician, switched her to Prevacid 30 mg twice daily she is also been taking the Zantac. She says with that over a couple of days she started feeling a little bit better. This week she is still having problems says that she is hurting constantly and feels worse with an empty stomach. Also complains of abdominal bloating. She's not had any documented fever chills, no diarrhea melena or hematochezia. She's not been on any NSAIDs and no recent antibiotics. He does state that she has been under an increased amount of stress at that may be contributing. She carries a diagnosis of fibromyalgia, scoliosis and is status post cholecystectomy, appendectomy and hysterectomy.    Review of Systems  Constitutional: Positive for appetite change.  HENT: Negative.   Eyes: Negative.   Respiratory: Negative.   Cardiovascular: Negative.   Gastrointestinal: Positive for nausea and abdominal  pain.  Genitourinary: Negative.   Musculoskeletal: Positive for back pain.  Skin: Negative.   Neurological: Negative.   Hematological: Negative.   Psychiatric/Behavioral: Negative.    Outpatient Prescriptions Prior to Visit  Medication Sig Dispense Refill  . acetaminophen (TYLENOL) 500 MG tablet Take 500-1,000 mg by mouth 2 (two) times daily as needed. Take 1000 MG at bedtime and take 500 MG in the morning if needed for fibromyalgia.      . cycloSPORINE (RESTASIS) 0.05 % ophthalmic emulsion Place 1 drop into both eyes 2 (two) times daily.      Marland Kitchen escitalopram (LEXAPRO) 10 MG tablet Take 10 mg by mouth 2 (two) times daily.      Marland Kitchen estrogens, conjugated, (PREMARIN) 0.3 MG tablet Take 0.3 mg by mouth daily.      Marland Kitchen levothyroxine (SYNTHROID, LEVOTHROID) 25 MCG tablet Take 25 mcg by mouth at bedtime.      Marland Kitchen lisinopril (PRINIVIL,ZESTRIL) 2.5 MG tablet Take 1.25 mg by mouth daily.      . methylphenidate (DAYTRANA) 10 mg/9hr Place 1 patch onto the skin as needed. Take if extra is needed. Wear patch for 9 hours only each day      . methylphenidate (DAYTRANA) 15 mg/9hr Place 1 patch onto the skin daily. wear patch for 9 hours only each day      . pravastatin (PRAVACHOL) 40 MG tablet Take 40 mg by mouth at bedtime.      . pyridOXINE (VITAMIN B-6) 100 MG tablet Take 200 mg by mouth every  other day. Rotate with 200 MG      . testosterone (ANDROGEL) 50 MG/5GM GEL Place 5 g onto the skin daily.      . traZODone (DESYREL) 50 MG tablet Take 75-100 mg by mouth at bedtime.      Marland Kitchen esomeprazole (NEXIUM) 40 MG capsule Take 40 mg by mouth at bedtime.       . pyridOXINE (VITAMIN B-6) 100 MG tablet Take 100 mg by mouth every other day.       Allergies  Allergen Reactions  . Adhesive (Tape)   . Bupropion Hcl   . Codeine   . Concerta (Methylphenidate)   . Demerol (Meperidine) Nausea And Vomiting  . Dextromethorphan   . Fentanyl   . Guaifenesin & Derivatives   . Lipitor (Atorvastatin)   . Lovaza (Omega-3-Acid  Ethyl Esters)   . Lyrica (Pregabalin)   . Morphine And Related   . Naltrexone   . Nickel   . Oxycodone-Acetaminophen Nausea And Vomiting  . Percodan (Oxycodone-Aspirin) Nausea And Vomiting  . Prilosec (Omeprazole)   . Tegaderm Ag Mesh (Silver)   . Topamax (Topiramate)   . Valium (Diazepam)     Small doses are OK but large doses are not. Long-term use causes depression.   . Versed (Midazolam)   . Lamotrigine Rash  . Percocet (Oxycodone-Acetaminophen) Rash   Patient Active Problem List  Diagnosis  . ARTHRITIS, RIGHT KNEE  . BUNIONS, BILATERAL  . FIBROMYALGIA  . FOOT PAIN, BILATERAL  . UNEQUAL LEG LENGTH  . CONGENITAL PES PLANUS  . GERD (gastroesophageal reflux disease)  . Diverticulosis  . Personal history of colonic polyps   History  Substance Use Topics  . Smoking status: Passive Smoke Exposure - Never Smoker  . Smokeless tobacco: Not on file     Comment: Flight attentent for 7 years. [id number] Passive smoker  . Alcohol Use: Yes     Comment: Social- wine       Objective:   Physical Exam well-developed white female in no acute distress, pleasant. HEENT; nontraumatic normocephalic EOMI PERRLA sclera anicteric, Neck; supple no JVD, Cardiovascular; regular rate and rhythm with S1-S2 no murmur or gallop, Pulmonary; clear bilaterally, Abdomen; soft she is tender in the epigastrium no guarding, or rebound, no palpable mass or hepatosplenomegaly bowel sounds are present, Rectal; exam not Done, Ext; no clubbing cyanosis or edema skin warm and dry, Psych; mood and affect normal and appropriate.        Assessment & Plan:  #37 66 year old white female with chronic GERD, status post cholecystectomy now presenting with 2-3 week history of persistent subxiphoid and epigastric burning pain associated with nausea and abdominal bloating, currently refractory to twice daily PPI therapy. Rule out possible bile gastritis, peptic ulcer disease. #2 fibromyalgia #3 history of hyperplastic  colon polyps-last colonoscopy July 2009 due for followup in 2014 #4 ADD  Plan; continue Prevacid 30 mg by mouth twice daily Add Carafate suspension 1 g between meals and at bedtime CBC ,CMET,and CRP Schedule for upper endoscopy with Dr. Marina Goodell, procedure discussed in detail with the patient and she is agreeable to proceed. Patient has multiple medication allergies including allergy to fentanyl. Her procedure is scheduled with propofol.

## 2012-02-20 ENCOUNTER — Other Ambulatory Visit: Payer: Self-pay | Admitting: *Deleted

## 2012-02-20 ENCOUNTER — Telehealth: Payer: Self-pay | Admitting: *Deleted

## 2012-02-20 ENCOUNTER — Encounter: Payer: Self-pay | Admitting: *Deleted

## 2012-02-20 DIAGNOSIS — K219 Gastro-esophageal reflux disease without esophagitis: Secondary | ICD-10-CM

## 2012-02-20 DIAGNOSIS — R110 Nausea: Secondary | ICD-10-CM

## 2012-02-20 NOTE — Telephone Encounter (Signed)
Opened in error

## 2012-02-20 NOTE — Telephone Encounter (Signed)
Spoke with pt who states she is sick with a temp of 101 and doesn't think she will be well by 02/23/12. She asked that we keep the 03/27/12 appt even it's at [facility]

## 2012-02-21 ENCOUNTER — Encounter (HOSPITAL_COMMUNITY): Payer: Self-pay | Admitting: *Deleted

## 2012-02-21 ENCOUNTER — Other Ambulatory Visit: Payer: Self-pay | Admitting: *Deleted

## 2012-02-21 DIAGNOSIS — R110 Nausea: Secondary | ICD-10-CM

## 2012-02-21 DIAGNOSIS — K219 Gastro-esophageal reflux disease without esophagitis: Secondary | ICD-10-CM

## 2012-02-21 NOTE — Progress Notes (Signed)
02-21-12 Instructed with teach back method used. Pt. To Fax medication allergy list with reactions on 02-22-12(multiple medication allergies). Rita Gregory

## 2012-02-22 ENCOUNTER — Encounter (HOSPITAL_COMMUNITY): Payer: Self-pay | Admitting: Pharmacy Technician

## 2012-02-22 ENCOUNTER — Telehealth: Payer: Self-pay | Admitting: *Deleted

## 2012-02-22 NOTE — Progress Notes (Signed)
lov note and ekg  dr Verdis Prime cardi0logy 03-21-2010 on chart. Stress test 04-29-2010 mc on chart

## 2012-02-22 NOTE — Telephone Encounter (Signed)
Informed pt we heard from Dr Marina Goodell and he can do her procedure tomorrow. Gave pt prep instructions, diet and NPO status for the EGD; pt stated understanding.

## 2012-02-23 ENCOUNTER — Ambulatory Visit (HOSPITAL_COMMUNITY): Payer: 59 | Admitting: Anesthesiology

## 2012-02-23 ENCOUNTER — Encounter (HOSPITAL_COMMUNITY)
Admission: RE | Disposition: A | Discharge status: Home / Self Care | Payer: Self-pay | Source: Ambulatory Visit | Attending: Internal Medicine

## 2012-02-23 ENCOUNTER — Encounter (HOSPITAL_COMMUNITY): Payer: Self-pay | Admitting: Anesthesiology

## 2012-02-23 ENCOUNTER — Ambulatory Visit (HOSPITAL_COMMUNITY)
Admission: RE | Admit: 2012-02-23 | Discharge: 2012-02-23 | Disposition: A | Discharge status: Home / Self Care | Payer: 59 | Source: Ambulatory Visit | Attending: Internal Medicine | Admitting: Internal Medicine

## 2012-02-23 ENCOUNTER — Encounter (HOSPITAL_COMMUNITY): Payer: Self-pay | Admitting: Gastroenterology

## 2012-02-23 ENCOUNTER — Ambulatory Visit (HOSPITAL_COMMUNITY): Admission: RE | Admit: 2012-02-23 | Payer: 59 | Source: Ambulatory Visit | Admitting: Internal Medicine

## 2012-02-23 ENCOUNTER — Encounter (HOSPITAL_COMMUNITY): Admission: RE | Payer: Self-pay | Source: Ambulatory Visit

## 2012-02-23 DIAGNOSIS — R110 Nausea: Secondary | ICD-10-CM

## 2012-02-23 DIAGNOSIS — R112 Nausea with vomiting, unspecified: Secondary | ICD-10-CM | POA: Insufficient documentation

## 2012-02-23 DIAGNOSIS — K219 Gastro-esophageal reflux disease without esophagitis: Secondary | ICD-10-CM | POA: Diagnosis not present

## 2012-02-23 DIAGNOSIS — IMO0001 Reserved for inherently not codable concepts without codable children: Secondary | ICD-10-CM | POA: Insufficient documentation

## 2012-02-23 DIAGNOSIS — K449 Diaphragmatic hernia without obstruction or gangrene: Secondary | ICD-10-CM | POA: Insufficient documentation

## 2012-02-23 DIAGNOSIS — R1013 Epigastric pain: Secondary | ICD-10-CM | POA: Insufficient documentation

## 2012-02-23 DIAGNOSIS — I10 Essential (primary) hypertension: Secondary | ICD-10-CM | POA: Insufficient documentation

## 2012-02-23 HISTORY — DX: Atrial septal defect: Q21.1

## 2012-02-23 HISTORY — DX: Other chronic pain: G89.29

## 2012-02-23 HISTORY — PX: ESOPHAGOGASTRODUODENOSCOPY: SHX5428

## 2012-02-23 HISTORY — DX: Patent foramen ovale: Q21.12

## 2012-02-23 SURGERY — EGD (ESOPHAGOGASTRODUODENOSCOPY)
Anesthesia: Monitor Anesthesia Care

## 2012-02-23 MED ORDER — LACTATED RINGERS IV SOLN
INTRAVENOUS | Status: DC
  Administered 2012-02-23: 1000 mL via INTRAVENOUS

## 2012-02-23 MED ORDER — BUTAMBEN-TETRACAINE-BENZOCAINE 2-2-14 % EX AERO
INHALATION_SPRAY | CUTANEOUS | Status: DC | PRN
  Administered 2012-02-23: 1 via TOPICAL

## 2012-02-23 MED ORDER — SODIUM CHLORIDE 0.9 % IV SOLN: INTRAVENOUS | Status: DC

## 2012-02-23 MED ORDER — PROPOFOL 10 MG/ML IV EMUL
INTRAVENOUS | Status: DC | PRN
  Administered 2012-02-23: 140 ug/kg/min via INTRAVENOUS

## 2012-02-23 NOTE — H&P (View-Only) (Signed)
Agree with initial assessment and plans 

## 2012-02-23 NOTE — Op Note (Signed)
[facility] [address] [place], [address]   ENDOSCOPY PROCEDURE REPORT  PATIENT: [name], [name]  MR#: [id number] BIRTHDATE: [birth year] , 66  yrs. old GENDER: Female ENDOSCOPIST: Roxy Cedar, MD REFERRED BY:  .  Self / Office PROCEDURE DATE:  02/23/2012 PROCEDURE:  EGD, diagnostic ASA CLASS:     Class II INDICATIONS:  epigastric pain. MEDICATIONS: MAC sedation, administered by CRNA and See Anesthesia Report. TOPICAL ANESTHETIC: Cetacaine Spray  DESCRIPTION OF PROCEDURE: After the risks benefits and alternatives of the procedure were thoroughly explained, informed consent was obtained.  The Pentax Gastroscope [id number] endoscope was introduced through the mouth and advanced to the second portion of the duodenum. Without limitations.  The instrument was slowly withdrawn as the mucosa was fully examined.      The upper, middle and distal third of the esophagus were carefully inspected and no abnormalities were noted.  The z-line was well seen at the GEJ.  The endoscope was pushed into the fundus which was normal including a retroflexed view.  The antrum, gastric body, first and second part of the duodenum were unremarkable. Retroflexed views revealed a hiatal hernia.     The scope was then withdrawn from the patient and the procedure completed.  COMPLICATIONS: There were no complications. ENDOSCOPIC IMPRESSION: 1. Normal EGD  RECOMMENDATIONS: 1. continue current medications for GERD  REPEAT EXAM:  eSigned:  Roxy Cedar, MD 02/23/2012 3:13 PM   CC:The Patient and Hali Marry, MD

## 2012-02-23 NOTE — Interval H&P Note (Signed)
History and Physical Interval Note:  02/23/2012 2:15 PM  Rita Gregory  has presented today for surgery, with the diagnosis of nausea vomiting  The various methods of treatment have been discussed with the patient and family. After consideration of risks, benefits and other options for treatment, the patient has consented to  Procedure(s) (LRB) with comments: ESOPHAGOGASTRODUODENOSCOPY (EGD) (N/A) as a surgical intervention .  The patient's history has been reviewed, patient examined, no change in status, stable for surgery.  I have reviewed the patient's chart and labs.  Questions were answered to the patient's satisfaction.    Recently seen in the office. Symptoms somewhat better on Carafate. Still with significant burning discomfort. Significant stress. Did not have laboratories performed. Plan upper endoscopy to further evaluate pain and rule out organic basis.The nature of the procedure, as well as the risks, benefits, and alternatives were carefully and thoroughly reviewed with the patient. Ample time for discussion and questions allowed. The patient understood, was satisfied, and agreed to proceed.    Wilhemina Bonito. Eda Keys., M.D. [facility] Division of Gastroenterology

## 2012-02-23 NOTE — Anesthesia Preprocedure Evaluation (Addendum)
Anesthesia Evaluation  Patient identified by MRN, date of birth, ID band Patient awake    Reviewed: Allergy & Precautions, H&P , NPO status , Patient's Chart, lab work & pertinent test results  Airway Mallampati: I TM Distance: >3 FB Neck ROM: Full    Dental  (+) Dental Advisory Given and Teeth Intact   Pulmonary neg pulmonary ROS,  breath sounds clear to auscultation  Pulmonary exam normal       Cardiovascular hypertension, Pt. on medications Rhythm:Regular Rate:Normal  PFO   Neuro/Psych  Neuromuscular disease negative psych ROS   GI/Hepatic Neg liver ROS, GERD-  ,  Endo/Other  negative endocrine ROS  Renal/GU negative Renal ROS     Musculoskeletal  (+) Fibromyalgia -  Abdominal   Peds  Hematology negative hematology ROS (+)   Anesthesia Other Findings   Reproductive/Obstetrics                         Anesthesia Physical Anesthesia Plan  ASA: II  Anesthesia Plan: MAC   Post-op Pain Management:    Induction: Intravenous  Airway Management Planned: Simple Face Mask  Additional Equipment:   Intra-op Plan:   Post-operative Plan:   Informed Consent: I have reviewed the patients History and Physical, chart, labs and discussed the procedure including the risks, benefits and alternatives for the proposed anesthesia with the patient or authorized representative who has indicated his/her understanding and acceptance.   Dental advisory given  Plan Discussed with: CRNA  Anesthesia Plan Comments: (Pt with hx of PFO)        Anesthesia Quick Evaluation

## 2012-02-23 NOTE — Transfer of Care (Signed)
Immediate Anesthesia Transfer of Care Note  Patient: Rita Gregory  Procedure(s) Performed: Procedure(s) (LRB) with comments: ESOPHAGOGASTRODUODENOSCOPY (EGD) (N/A)  Patient Location: PACU and Endoscopy Unit  Anesthesia Type:MAC  Level of Consciousness: oriented, sedated and patient cooperative  Airway & Oxygen Therapy: Patient Spontanous Breathing and Patient connected to nasal cannula oxygen  Post-op Assessment: Report given to PACU RN, Post -op Vital signs reviewed and stable and Patient moving all extremities  Post vital signs: Reviewed and stable  Complications: No apparent anesthesia complications

## 2012-02-23 NOTE — Transfer of Care (Signed)
Immediate Anesthesia Transfer of Care Note  Patient: Rita Gregory  Procedure(s) Performed: Procedure(s) (LRB) with comments: ESOPHAGOGASTRODUODENOSCOPY (EGD) (N/A)  Patient Location: PACU and Endoscopy Unit  Anesthesia Type:MAC  Level of Consciousness: oriented, sedated and patient cooperative  Airway & Oxygen Therapy: Patient Spontanous Breathing and Patient connected to nasal cannula oxygen  Post-op Assessment: Report given to PACU RN and Post -op Vital signs reviewed and stable  Post vital signs: Reviewed and stable  Complications: No apparent anesthesia complications

## 2012-02-24 ENCOUNTER — Encounter (HOSPITAL_COMMUNITY): Payer: Self-pay | Admitting: Internal Medicine

## 2012-02-29 DIAGNOSIS — M545 Low back pain, unspecified: Secondary | ICD-10-CM | POA: Diagnosis not present

## 2012-02-29 DIAGNOSIS — M5430 Sciatica, unspecified side: Secondary | ICD-10-CM | POA: Diagnosis not present

## 2012-03-06 DIAGNOSIS — M545 Low back pain, unspecified: Secondary | ICD-10-CM | POA: Diagnosis not present

## 2012-03-06 DIAGNOSIS — M5430 Sciatica, unspecified side: Secondary | ICD-10-CM | POA: Diagnosis not present

## 2012-03-27 ENCOUNTER — Encounter: Payer: 59 | Admitting: Internal Medicine

## 2012-04-10 ENCOUNTER — Encounter (HOSPITAL_COMMUNITY): Payer: Self-pay | Admitting: Family Medicine

## 2012-04-10 ENCOUNTER — Emergency Department (HOSPITAL_COMMUNITY): Payer: No Typology Code available for payment source

## 2012-04-10 ENCOUNTER — Emergency Department (HOSPITAL_COMMUNITY)
Admission: EM | Admit: 2012-04-10 | Discharge: 2012-04-11 | Disposition: A | Discharge status: Home / Self Care | Payer: No Typology Code available for payment source | Attending: Emergency Medicine | Admitting: Emergency Medicine

## 2012-04-10 DIAGNOSIS — K219 Gastro-esophageal reflux disease without esophagitis: Secondary | ICD-10-CM | POA: Insufficient documentation

## 2012-04-10 DIAGNOSIS — S3981XA Other specified injuries of abdomen, initial encounter: Secondary | ICD-10-CM | POA: Insufficient documentation

## 2012-04-10 DIAGNOSIS — G8929 Other chronic pain: Secondary | ICD-10-CM | POA: Insufficient documentation

## 2012-04-10 DIAGNOSIS — S239XXA Sprain of unspecified parts of thorax, initial encounter: Secondary | ICD-10-CM | POA: Insufficient documentation

## 2012-04-10 DIAGNOSIS — Y9389 Activity, other specified: Secondary | ICD-10-CM | POA: Insufficient documentation

## 2012-04-10 DIAGNOSIS — S335XXA Sprain of ligaments of lumbar spine, initial encounter: Secondary | ICD-10-CM | POA: Insufficient documentation

## 2012-04-10 DIAGNOSIS — Z860100 Personal history of colon polyps, unspecified: Secondary | ICD-10-CM | POA: Insufficient documentation

## 2012-04-10 DIAGNOSIS — R404 Transient alteration of awareness: Secondary | ICD-10-CM | POA: Insufficient documentation

## 2012-04-10 DIAGNOSIS — R209 Unspecified disturbances of skin sensation: Secondary | ICD-10-CM | POA: Insufficient documentation

## 2012-04-10 DIAGNOSIS — S139XXA Sprain of joints and ligaments of unspecified parts of neck, initial encounter: Secondary | ICD-10-CM | POA: Insufficient documentation

## 2012-04-10 DIAGNOSIS — Z8739 Personal history of other diseases of the musculoskeletal system and connective tissue: Secondary | ICD-10-CM | POA: Insufficient documentation

## 2012-04-10 DIAGNOSIS — S3991XA Unspecified injury of abdomen, initial encounter: Secondary | ICD-10-CM

## 2012-04-10 DIAGNOSIS — Z90710 Acquired absence of both cervix and uterus: Secondary | ICD-10-CM | POA: Insufficient documentation

## 2012-04-10 DIAGNOSIS — I10 Essential (primary) hypertension: Secondary | ICD-10-CM | POA: Insufficient documentation

## 2012-04-10 DIAGNOSIS — S161XXA Strain of muscle, fascia and tendon at neck level, initial encounter: Secondary | ICD-10-CM

## 2012-04-10 DIAGNOSIS — S20219A Contusion of unspecified front wall of thorax, initial encounter: Secondary | ICD-10-CM

## 2012-04-10 DIAGNOSIS — Q211 Atrial septal defect: Secondary | ICD-10-CM | POA: Insufficient documentation

## 2012-04-10 DIAGNOSIS — S39012A Strain of muscle, fascia and tendon of lower back, initial encounter: Secondary | ICD-10-CM

## 2012-04-10 DIAGNOSIS — Y92410 Unspecified street and highway as the place of occurrence of the external cause: Secondary | ICD-10-CM | POA: Insufficient documentation

## 2012-04-10 DIAGNOSIS — Z79899 Other long term (current) drug therapy: Secondary | ICD-10-CM | POA: Insufficient documentation

## 2012-04-10 DIAGNOSIS — S8990XA Unspecified injury of unspecified lower leg, initial encounter: Secondary | ICD-10-CM | POA: Insufficient documentation

## 2012-04-10 DIAGNOSIS — Q2111 Secundum atrial septal defect: Secondary | ICD-10-CM | POA: Insufficient documentation

## 2012-04-10 DIAGNOSIS — S46912A Strain of unspecified muscle, fascia and tendon at shoulder and upper arm level, left arm, initial encounter: Secondary | ICD-10-CM

## 2012-04-10 DIAGNOSIS — S4490XA Injury of unspecified nerve at shoulder and upper arm level, unspecified arm, initial encounter: Secondary | ICD-10-CM | POA: Insufficient documentation

## 2012-04-10 DIAGNOSIS — Z9089 Acquired absence of other organs: Secondary | ICD-10-CM | POA: Insufficient documentation

## 2012-04-10 DIAGNOSIS — IMO0002 Reserved for concepts with insufficient information to code with codable children: Secondary | ICD-10-CM | POA: Insufficient documentation

## 2012-04-10 DIAGNOSIS — M4120 Other idiopathic scoliosis, site unspecified: Secondary | ICD-10-CM | POA: Insufficient documentation

## 2012-04-10 LAB — CBC WITH DIFFERENTIAL/PLATELET
Basophils Absolute: 0 10*3/uL (ref 0.0–0.1)
Basophils Relative: 1 % (ref 0–1)
Eosinophils Absolute: 0.1 10*3/uL (ref 0.0–0.7)
Eosinophils Relative: 1 % (ref 0–5)
HCT: 40.6 % (ref 36.0–46.0)
Hemoglobin: 13 g/dL (ref 12.0–15.0)
Lymphocytes Relative: 36 % (ref 12–46)
Lymphs Abs: 3 10*3/uL (ref 0.7–4.0)
MCH: 28.4 pg (ref 26.0–34.0)
MCHC: 32 g/dL (ref 30.0–36.0)
MCV: 88.8 fL (ref 78.0–100.0)
Monocytes Absolute: 0.5 10*3/uL (ref 0.1–1.0)
Monocytes Relative: 6 % (ref 3–12)
Neutro Abs: 4.7 10*3/uL (ref 1.7–7.7)
Neutrophils Relative %: 56 % (ref 43–77)
Platelets: 340 10*3/uL (ref 150–400)
RBC: 4.57 MIL/uL (ref 3.87–5.11)
RDW: 13.9 % (ref 11.5–15.5)
WBC: 8.4 10*3/uL (ref 4.0–10.5)

## 2012-04-10 LAB — COMPREHENSIVE METABOLIC PANEL
ALT: 26 U/L (ref 0–35)
AST: 32 U/L (ref 0–37)
Albumin: 4 g/dL (ref 3.5–5.2)
Alkaline Phosphatase: 51 U/L (ref 39–117)
BUN: 23 mg/dL (ref 6–23)
CO2: 31 mEq/L (ref 19–32)
Calcium: 10 mg/dL (ref 8.4–10.5)
Chloride: 100 mEq/L (ref 96–112)
Creatinine, Ser: 0.95 mg/dL (ref 0.50–1.10)
GFR calc Af Amer: 70 mL/min — ABNORMAL LOW (ref >90)
GFR calc non Af Amer: 61 mL/min — ABNORMAL LOW (ref >90)
Glucose, Bld: 104 mg/dL — ABNORMAL HIGH (ref 70–99)
Potassium: 3.7 mEq/L (ref 3.5–5.1)
Sodium: 140 mEq/L (ref 135–145)
Total Bilirubin: 0.2 mg/dL — ABNORMAL LOW (ref 0.3–1.2)
Total Protein: 7.5 g/dL (ref 6.0–8.3)

## 2012-04-10 LAB — TYPE AND SCREEN
ABO/RH(D): O POS
Antibody Screen: NEGATIVE

## 2012-04-10 MED ORDER — ACETAMINOPHEN 325 MG PO TABS
650.0000 mg | ORAL_TABLET | Freq: Once | ORAL | Status: AC
  Administered 2012-04-10: 650 mg via ORAL
  Filled 2012-04-10: qty 2

## 2012-04-10 MED ORDER — SODIUM CHLORIDE 0.9 % IV SOLN
INTRAVENOUS | Status: DC
  Administered 2012-04-10: 23:00:00 via INTRAVENOUS

## 2012-04-10 MED ORDER — SODIUM CHLORIDE 0.9 % IV BOLUS (SEPSIS)
500.0000 mL | Freq: Once | INTRAVENOUS | Status: AC
  Administered 2012-04-10: 500 mL via INTRAVENOUS
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
## 2012-04-11 ENCOUNTER — Emergency Department (HOSPITAL_COMMUNITY): Payer: No Typology Code available for payment source

## 2012-04-11 ENCOUNTER — Encounter (HOSPITAL_COMMUNITY): Payer: Self-pay | Admitting: Radiology

## 2012-04-11 DIAGNOSIS — M25519 Pain in unspecified shoulder: Secondary | ICD-10-CM | POA: Diagnosis not present

## 2012-04-11 DIAGNOSIS — S4980XA Other specified injuries of shoulder and upper arm, unspecified arm, initial encounter: Secondary | ICD-10-CM | POA: Diagnosis not present

## 2012-04-11 DIAGNOSIS — S46909A Unspecified injury of unspecified muscle, fascia and tendon at shoulder and upper arm level, unspecified arm, initial encounter: Secondary | ICD-10-CM | POA: Diagnosis not present

## 2012-04-11 LAB — URINALYSIS, ROUTINE W REFLEX MICROSCOPIC
Bilirubin Urine: NEGATIVE
Glucose, UA: NEGATIVE mg/dL
Hgb urine dipstick: NEGATIVE
Ketones, ur: NEGATIVE mg/dL
Leukocytes, UA: NEGATIVE
Nitrite: NEGATIVE
Protein, ur: NEGATIVE mg/dL
Specific Gravity, Urine: 1.024 (ref 1.005–1.030)
Urobilinogen, UA: 0.2 mg/dL (ref 0.0–1.0)
pH: 7 (ref 5.0–8.0)

## 2012-04-11 MED ORDER — IOHEXOL 300 MG/ML  SOLN
100.0000 mL | Freq: Once | INTRAMUSCULAR | Status: AC | PRN
  Administered 2012-04-11: 100 mL via INTRAVENOUS

## 2012-04-11 MED ORDER — HYDROCODONE-ACETAMINOPHEN 5-325 MG PO TABS: 2.0000 | ORAL_TABLET | Freq: Four times a day (QID) | ORAL | Status: DC | PRN
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
## 2012-04-17 DIAGNOSIS — Z96659 Presence of unspecified artificial knee joint: Secondary | ICD-10-CM | POA: Diagnosis not present

## 2012-04-17 DIAGNOSIS — Z471 Aftercare following joint replacement surgery: Secondary | ICD-10-CM | POA: Diagnosis not present

## 2012-04-18 DIAGNOSIS — L259 Unspecified contact dermatitis, unspecified cause: Secondary | ICD-10-CM | POA: Diagnosis not present

## 2012-05-15 ENCOUNTER — Other Ambulatory Visit: Payer: Self-pay | Admitting: Orthopedic Surgery

## 2012-05-15 DIAGNOSIS — S43429A Sprain of unspecified rotator cuff capsule, initial encounter: Secondary | ICD-10-CM | POA: Diagnosis not present

## 2012-05-15 DIAGNOSIS — M25512 Pain in left shoulder: Secondary | ICD-10-CM

## 2012-05-22 DIAGNOSIS — F3289 Other specified depressive episodes: Secondary | ICD-10-CM | POA: Diagnosis not present

## 2012-05-22 DIAGNOSIS — IMO0001 Reserved for inherently not codable concepts without codable children: Secondary | ICD-10-CM | POA: Diagnosis not present

## 2012-05-22 DIAGNOSIS — F329 Major depressive disorder, single episode, unspecified: Secondary | ICD-10-CM | POA: Diagnosis not present

## 2012-05-22 DIAGNOSIS — F068 Other specified mental disorders due to known physiological condition: Secondary | ICD-10-CM | POA: Diagnosis not present

## 2012-05-22 DIAGNOSIS — F909 Attention-deficit hyperactivity disorder, unspecified type: Secondary | ICD-10-CM | POA: Diagnosis not present

## 2012-05-24 ENCOUNTER — Other Ambulatory Visit: Payer: 59

## 2012-05-28 DIAGNOSIS — M4120 Other idiopathic scoliosis, site unspecified: Secondary | ICD-10-CM | POA: Diagnosis not present

## 2012-06-15 ENCOUNTER — Other Ambulatory Visit: Payer: 59

## 2012-07-10 DIAGNOSIS — E785 Hyperlipidemia, unspecified: Secondary | ICD-10-CM | POA: Diagnosis not present

## 2012-07-10 DIAGNOSIS — E559 Vitamin D deficiency, unspecified: Secondary | ICD-10-CM | POA: Diagnosis not present

## 2012-07-10 DIAGNOSIS — R5382 Chronic fatigue, unspecified: Secondary | ICD-10-CM | POA: Diagnosis not present

## 2012-07-10 DIAGNOSIS — G9332 Myalgic encephalomyelitis/chronic fatigue syndrome: Secondary | ICD-10-CM | POA: Diagnosis not present

## 2012-07-10 DIAGNOSIS — I10 Essential (primary) hypertension: Secondary | ICD-10-CM | POA: Diagnosis not present

## 2012-07-10 DIAGNOSIS — R7309 Other abnormal glucose: Secondary | ICD-10-CM | POA: Diagnosis not present

## 2012-07-10 DIAGNOSIS — E039 Hypothyroidism, unspecified: Secondary | ICD-10-CM | POA: Diagnosis not present

## 2012-07-13 ENCOUNTER — Ambulatory Visit
Admission: RE | Admit: 2012-07-13 | Discharge: 2012-07-13 | Disposition: A | Discharge status: Home / Self Care | Payer: 59 | Source: Ambulatory Visit | Attending: Orthopedic Surgery | Admitting: Orthopedic Surgery

## 2012-07-13 DIAGNOSIS — G9332 Myalgic encephalomyelitis/chronic fatigue syndrome: Secondary | ICD-10-CM | POA: Diagnosis not present

## 2012-07-13 DIAGNOSIS — R5382 Chronic fatigue, unspecified: Secondary | ICD-10-CM | POA: Diagnosis not present

## 2012-07-13 DIAGNOSIS — M25512 Pain in left shoulder: Secondary | ICD-10-CM

## 2012-07-13 DIAGNOSIS — R7309 Other abnormal glucose: Secondary | ICD-10-CM | POA: Diagnosis not present

## 2012-07-13 DIAGNOSIS — E559 Vitamin D deficiency, unspecified: Secondary | ICD-10-CM | POA: Diagnosis not present

## 2012-07-13 DIAGNOSIS — G894 Chronic pain syndrome: Secondary | ICD-10-CM | POA: Diagnosis not present

## 2012-07-13 DIAGNOSIS — I10 Essential (primary) hypertension: Secondary | ICD-10-CM | POA: Diagnosis not present

## 2012-07-13 DIAGNOSIS — E785 Hyperlipidemia, unspecified: Secondary | ICD-10-CM | POA: Diagnosis not present

## 2012-07-13 DIAGNOSIS — E039 Hypothyroidism, unspecified: Secondary | ICD-10-CM | POA: Diagnosis not present

## 2012-07-19 ENCOUNTER — Other Ambulatory Visit: Payer: Self-pay | Admitting: *Deleted

## 2012-09-12 DIAGNOSIS — IMO0002 Reserved for concepts with insufficient information to code with codable children: Secondary | ICD-10-CM | POA: Diagnosis not present

## 2012-09-12 DIAGNOSIS — M545 Low back pain, unspecified: Secondary | ICD-10-CM | POA: Diagnosis not present

## 2012-09-27 DIAGNOSIS — M545 Low back pain, unspecified: Secondary | ICD-10-CM | POA: Diagnosis not present

## 2012-09-27 DIAGNOSIS — M5430 Sciatica, unspecified side: Secondary | ICD-10-CM | POA: Diagnosis not present

## 2012-10-01 ENCOUNTER — Other Ambulatory Visit: Payer: Self-pay | Admitting: Obstetrics and Gynecology

## 2012-10-01 ENCOUNTER — Other Ambulatory Visit (HOSPITAL_COMMUNITY)
Admission: RE | Admit: 2012-10-01 | Discharge: 2012-10-01 | Disposition: A | Discharge status: Home / Self Care | Payer: 59 | Source: Ambulatory Visit | Attending: Obstetrics and Gynecology | Admitting: Obstetrics and Gynecology

## 2012-10-01 DIAGNOSIS — Z01419 Encounter for gynecological examination (general) (routine) without abnormal findings: Secondary | ICD-10-CM | POA: Insufficient documentation

## 2012-10-01 DIAGNOSIS — Z1151 Encounter for screening for human papillomavirus (HPV): Secondary | ICD-10-CM | POA: Insufficient documentation

## 2012-10-04 ENCOUNTER — Other Ambulatory Visit: Payer: Self-pay | Admitting: "Endocrinology

## 2012-10-04 DIAGNOSIS — I10 Essential (primary) hypertension: Secondary | ICD-10-CM | POA: Diagnosis not present

## 2012-10-04 DIAGNOSIS — E785 Hyperlipidemia, unspecified: Secondary | ICD-10-CM | POA: Diagnosis not present

## 2012-10-04 DIAGNOSIS — R7309 Other abnormal glucose: Secondary | ICD-10-CM | POA: Diagnosis not present

## 2012-10-04 DIAGNOSIS — E039 Hypothyroidism, unspecified: Secondary | ICD-10-CM | POA: Diagnosis not present

## 2012-10-05 DIAGNOSIS — R5382 Chronic fatigue, unspecified: Secondary | ICD-10-CM | POA: Diagnosis not present

## 2012-10-05 DIAGNOSIS — E039 Hypothyroidism, unspecified: Secondary | ICD-10-CM | POA: Diagnosis not present

## 2012-10-05 DIAGNOSIS — I10 Essential (primary) hypertension: Secondary | ICD-10-CM | POA: Diagnosis not present

## 2012-10-05 DIAGNOSIS — G894 Chronic pain syndrome: Secondary | ICD-10-CM | POA: Diagnosis not present

## 2012-10-05 DIAGNOSIS — E785 Hyperlipidemia, unspecified: Secondary | ICD-10-CM | POA: Diagnosis not present

## 2012-10-05 DIAGNOSIS — R7309 Other abnormal glucose: Secondary | ICD-10-CM | POA: Diagnosis not present

## 2012-10-05 DIAGNOSIS — E559 Vitamin D deficiency, unspecified: Secondary | ICD-10-CM | POA: Diagnosis not present

## 2012-10-05 DIAGNOSIS — G9332 Myalgic encephalomyelitis/chronic fatigue syndrome: Secondary | ICD-10-CM | POA: Diagnosis not present

## 2012-10-24 ENCOUNTER — Encounter: Payer: Self-pay | Admitting: Internal Medicine

## 2013-01-10 DIAGNOSIS — I10 Essential (primary) hypertension: Secondary | ICD-10-CM | POA: Diagnosis not present

## 2013-01-10 DIAGNOSIS — E785 Hyperlipidemia, unspecified: Secondary | ICD-10-CM | POA: Diagnosis not present

## 2013-01-10 DIAGNOSIS — R5382 Chronic fatigue, unspecified: Secondary | ICD-10-CM | POA: Diagnosis not present

## 2013-01-10 DIAGNOSIS — G9332 Myalgic encephalomyelitis/chronic fatigue syndrome: Secondary | ICD-10-CM | POA: Diagnosis not present

## 2013-01-10 DIAGNOSIS — E039 Hypothyroidism, unspecified: Secondary | ICD-10-CM | POA: Diagnosis not present

## 2013-01-10 DIAGNOSIS — R7309 Other abnormal glucose: Secondary | ICD-10-CM | POA: Diagnosis not present

## 2013-01-10 DIAGNOSIS — E559 Vitamin D deficiency, unspecified: Secondary | ICD-10-CM | POA: Diagnosis not present

## 2013-01-11 DIAGNOSIS — R5381 Other malaise: Secondary | ICD-10-CM | POA: Diagnosis not present

## 2013-01-11 DIAGNOSIS — E039 Hypothyroidism, unspecified: Secondary | ICD-10-CM | POA: Diagnosis not present

## 2013-01-11 DIAGNOSIS — E785 Hyperlipidemia, unspecified: Secondary | ICD-10-CM | POA: Diagnosis not present

## 2013-01-11 DIAGNOSIS — G894 Chronic pain syndrome: Secondary | ICD-10-CM | POA: Diagnosis not present

## 2013-01-11 DIAGNOSIS — R7309 Other abnormal glucose: Secondary | ICD-10-CM | POA: Diagnosis not present

## 2013-01-11 DIAGNOSIS — I10 Essential (primary) hypertension: Secondary | ICD-10-CM | POA: Diagnosis not present

## 2013-01-11 DIAGNOSIS — E559 Vitamin D deficiency, unspecified: Secondary | ICD-10-CM | POA: Diagnosis not present

## 2013-01-15 DIAGNOSIS — M4120 Other idiopathic scoliosis, site unspecified: Secondary | ICD-10-CM | POA: Diagnosis not present

## 2013-02-13 ENCOUNTER — Encounter: Payer: Self-pay | Admitting: Neurology

## 2013-02-13 ENCOUNTER — Ambulatory Visit (INDEPENDENT_AMBULATORY_CARE_PROVIDER_SITE_OTHER): Payer: 59 | Admitting: Neurology

## 2013-02-13 ENCOUNTER — Encounter (INDEPENDENT_AMBULATORY_CARE_PROVIDER_SITE_OTHER): Payer: Self-pay

## 2013-02-13 VITALS — BP 108/75 | HR 75 | Resp 16 | Ht 59.0 in | Wt 159.0 lb

## 2013-02-13 DIAGNOSIS — R569 Unspecified convulsions: Secondary | ICD-10-CM

## 2013-02-13 DIAGNOSIS — H539 Unspecified visual disturbance: Secondary | ICD-10-CM

## 2013-02-13 DIAGNOSIS — IMO0001 Reserved for inherently not codable concepts without codable children: Secondary | ICD-10-CM

## 2013-02-13 HISTORY — DX: Reserved for inherently not codable concepts without codable children: IMO0001

## 2013-02-13 MED ORDER — GABAPENTIN 100 MG PO CAPS: 100.0000 mg | ORAL_CAPSULE | Freq: Two times a day (BID) | ORAL | Status: DC

## 2013-02-13 NOTE — Progress Notes (Addendum)
[facility]  Provider:  Melvyn Novas, M D  Referring Provider: Altheimer, Veverly Fells, MD Primary Care Physician:  Junious Silk, MD  Chief Complaint  Patient presents with  . Advice Only    Weekness    HPI:  Rita Gregory is a 67 y.o. female  Is seen here as a referral from her endocrinologist , Dr. Leslie Dales , for  unexplained weakness.  She has been  seen since 2006 by Dr. Hassie Bruce  at the [facility] in [place] . He has diagnosed her with fibromyalgia and found this is a possible cause for the weakness.   The patient described very paroxysmal,  episodic "waves ' of weakness, of becoming paralyzed, "floppy".  Spells  Were infrequent in the past, but at times progressing to a near collapse.  She has not found any triggers or precipitating factor , as she does not feel lightheaded , nauseated or dizzy she has no aura - and neither  associated pain or pressure sensation.  The pressure sensation is described as rising from the lower back and descending through the pelvis, hips and thighs and down to the feet. This last  a minute long,  The area of L4-5  Central lower back is the originating locus . Per her description, there is always progression- the pressure sensation will not stay "there" . She  states that she  Experienced  about 10-15 minutes of "post- ictum", and  it takes this long to be able to stand up and walk . She may have even taken longer to feel fully restored to her strength.  During the time that it takes to restore her she has not is that her full peripheral vision capacity- vision  is blurred -but it is not a tunneled vision or a scotoma that she describes. She has never lost awareness.  The patient has no associated change in appetite, sleep habits, she was diagnosed as prediabetic already in 2002 but for the first time had an elevated HbA1c and 2008 after any surgery. She has noticed that most of the spells happened in the  morning often not exclusively,  2 of the  6 spells happened when she left the shower.   Her hypo-thyroidism is well treated. She has undergone a myelogram with Dr. Danielle Dess revealed a lateral right bending scoliosis, no stenosis.  She has a remote history of migrainous headaches, with aura.   On occasion she had vertigo, and experienced peripheral vision changes with that. This was thought to reflect a migraine as well.  She lives in chronic pain , Intensity 5 out of 10 level, any day, any time of day and attributed this pain to fibromyalgia for which she follows a specialist.  She is on Methylphenidate patches ( Ritalin) for " energy"  and feels this helps with pain, too.    Review of Systems: Out of a complete 14 system review, the patient complains of only the following symptoms, and all other reviewed systems are negative.  type A personality, fibromyalgia, ADD.  History   Social History  . Marital Status: Married    Spouse Name: Casimiro Needle    Number of Children: N/A  . Years of Education: N/A   Occupational History  . RN  [employer]   Social History Main Topics  . Smoking status: Passive Smoke Exposure - Never Smoker  . Smokeless tobacco: Not on file     Comment: Flight attentent for 7 years. [id number] Passive smoker  .  Alcohol Use: .5 - 1.5 oz/week    1-3 drink(s) per week     Comment: Social- wine  . Drug Use: No  . Sexual Activity: Yes    Partners: Male   Other Topics Concern  . Not on file   Social History Narrative   Husband is Dr. Molli Knock     Family History  Problem Relation Age of Onset  . Ovarian cancer Mother     passed at age 62  . Heart disease Father   . Kidney disease Father     Kidney cancer  . Stomach cancer Paternal Uncle   . Diabetes type II Father     passed at 28 yrs old  . Colon cancer Father     cancereous polyps  . Bipolar disorder Sister     Osteopenia  . Bipolar disorder Son     Past Medical History  Diagnosis Date  .  Diverticulosis of colon (without mention of hemorrhage) 11/09/2007  . Colon polyp 11/09/2007    Hyperplastic   . Esophageal reflux   . Fibromyalgia   . Hypertension   . Kyphoscoliosis     40 degree bend ro right side  . Chronic pain     Dr. Danielle Dess follows  . Patent foramen ovale     hx.- Dr. Rexene Edison. Smith,cardiology- follows  . Migraine     Past Surgical History  Procedure Laterality Date  . Appendectomy    . Cholecystectomy    . Abdominal hysterectomy    . Joint replacement      RTKA '05  . Knee arthroscopy w/ acl reconstruction      Rt. Knee  . Dry eyes      uses Restasis daily  . Cesarean section      x2  . Right rotator cuff  2011    sx  . Esophagogastroduodenoscopy  02/23/2012    Procedure: ESOPHAGOGASTRODUODENOSCOPY (EGD);  Surgeon: Hilarie Fredrickson, MD;  Location: [facility];  Service: Endoscopy;  Laterality: N/A;    Current Outpatient Prescriptions  Medication Sig Dispense Refill  . acetaminophen (TYLENOL) 500 MG tablet Take 500-1,000 mg by mouth 2 (two) times daily as needed. Take 1000 MG at bedtime and take 500 MG in the morning if needed for fibromyalgia.      Marland Kitchen albuterol (PROVENTIL HFA;VENTOLIN HFA) 108 (90 BASE) MCG/ACT inhaler Inhale 2 puffs into the lungs every 6 (six) hours as needed. For shortness of breath      . Choline Fenofibrate (TRILIPIX) 135 MG capsule Take 135 mg by mouth at bedtime.      . cycloSPORINE (RESTASIS) 0.05 % ophthalmic emulsion Place 1 drop into both eyes 2 (two) times daily.      Marland Kitchen escitalopram (LEXAPRO) 10 MG tablet Take 10 mg by mouth 2 (two) times daily.      Marland Kitchen estrogens, conjugated, (PREMARIN) 0.3 MG tablet Take 0.3 mg by mouth daily.      . lansoprazole (PREVACID) 30 MG capsule Take 30 mg by mouth 2 (two) times daily before a meal.      . levothyroxine (SYNTHROID, LEVOTHROID) 25 MCG tablet Take 25 mcg by mouth at bedtime.      Marland Kitchen lisinopril (PRINIVIL,ZESTRIL) 2.5 MG tablet Take 1.25 mg by mouth every morning.       . methylphenidate  (DAYTRANA) 10 mg/9hr Place 1 patch onto the skin daily as needed (15mg  daily/10 mg as a prn). Take if extra if needed for ADHD. Wear patch for 9 hours only each  day      . methylphenidate (DAYTRANA) 15 mg/9hr Place 1 patch onto the skin daily. wear patch for 9 hours only each day      . pravastatin (PRAVACHOL) 40 MG tablet Take 40 mg by mouth at bedtime.      . pyridOXINE (VITAMIN B-6) 100 MG tablet Take 200 mg by mouth every other day. Rotate with 200 MG      . ranitidine (ZANTAC) 150 MG capsule Take 150 mg by mouth 2 (two) times daily as needed. For GERD      . testosterone (ANDROGEL) 50 MG/5GM GEL Place 5 g onto the skin daily. 3 times a week to skin area      . traZODone (DESYREL) 50 MG tablet Take 75-100 mg by mouth at bedtime.      Marland Kitchen EPINEPHrine (EPIPEN 2-PAK) 0.3 mg/0.3 mL DEVI Inject 0.3 mg into the muscle once. To medications, touch, or inhaltions      . ketoconazole (NIZORAL) 2 % cream Apply 1 application topically 2 (two) times daily.       No current facility-administered medications for this visit.    Allergies as of 02/13/2013 - Review Complete 02/13/2013  Allergen Reaction Noted  . Fentanyl Anaphylaxis 03/27/2008  . Morphine and related Anaphylaxis 09/26/2011  . Bupropion hcl  03/27/2008  . Codeine Nausea And Vomiting 03/27/2008  . Demerol [meperidine] Nausea And Vomiting 09/26/2011  . Dextromethorphan Other (See Comments) 09/26/2011  . Guaifenesin & derivatives  09/26/2011  . Lipitor [atorvastatin]  09/26/2011  . Lovaza [omega-3-acid ethyl esters] Diarrhea 09/26/2011  . Lyrica [pregabalin]  09/26/2011  . Naltrexone  09/26/2011  . Percodan [oxycodone-aspirin] Nausea And Vomiting 09/26/2011  . Prilosec [omeprazole]  09/26/2011  . Topamax [topiramate]  09/26/2011  . Valium [diazepam]  09/26/2011  . Versed [midazolam]  09/26/2011  . Adhesive [tape] Rash 09/26/2011  . Concerta [methylphenidate] Palpitations 09/26/2011  . Lamotrigine Rash 03/27/2008  . Nickel Rash  09/26/2011  . Oxycodone-acetaminophen Nausea And Vomiting and Rash 03/27/2008  . Percocet [oxycodone-acetaminophen] Nausea And Vomiting and Rash 09/26/2011  . Tegaderm ag mesh [silver] Rash 09/26/2011    Vitals: BP 108/75  Pulse 75  Resp 16  Ht 4\' 11"  (1.499 m)  Wt 159 lb (72.122 kg)  BMI 32.10 kg/m2 Last Weight:  Wt Readings from Last 1 Encounters:  02/13/13 159 lb (72.122 kg)   Last Height:   Ht Readings from Last 1 Encounters:  02/13/13 4\' 11"  (1.499 m)    Physical exam:  General: The patient is awake, alert and appears not in acute distress. The patient is well groomed. Head: Normocephalic, atraumatic. Neck is supple. Mallampati 2 , neck circumference: 13.25  Cardiovascular:  Regular rate and rhythm , without  murmurs or carotid bruit, and without distended neck veins.  Respiratory: Lungs are clear to auscultation. Skin:  Without evidence of edema, or rash Trunk:This patient  has a normal posture.  Neurologic exam : The patient is awake and alert, oriented to place and time.  Memory subjective  described as intact.  There is a normal attention span & concentration ability during our visit interview.  Speech is fluent without dysarthria, dysphonia or aphasia. Mood and affect are appropriate.  Cranial nerves: Pupils are equal and briskly reactive to light. Funduscopic exam without   evidence of pallor or edema. Extraocular movements  in vertical and horizontal planes intact and without nystagmus.  Visual fields by finger perimetry are intact. Hearing to finger rub intact.  Facial sensation intact to fine  touch. Facial motor strength is symmetric and tongue and uvula move midline.  Motor exam:    Normal tone and normal muscle bulk and symmetric normal strength in all extremities.  Sensory:  Fine touch, pinprick and vibration were tested in all extremities. Proprioception is decreased , while vibration is preserved.  Coordination: Rapid alternating movements in the  fingers/hands is tested and normal. Finger-to-nose maneuver with evidence of  dysmetria. No drift.   Gait and station: Patient walks without assistive device , but limping, her right leg rotated a little outwards, the pelvis is tilted slightly , higher on the left.   Strength within normal limits. Stance is stable and normal.  Tandem gait is fragmented, she is very unsteady .  Romberg testing is normal.  Deep tendon reflexes: in the upper and lower extremities are attenuated , but symmetric - Babinski maneuver downgoing.   Assessment:  After physical and neurologic examination, review of extensive laboratory studies,   neurophysiology testing and pre-existing records, assessment is that of a possible complicated Migraine, AURA without headaches to follow.  The second possibility is a seizure type. This would fit her experience of fatigue following the spells.   I explained my diagnosis and answered the patient's questions about MS, vascular disease, spinal diseases.      Plan:  Treatment plan and additional workup :  I suggest an antiepileptic medication , which has the potential to treat both - migraines and seizures.  EEG will not yield a high chance of abnormal results,   MRI brain with and without contrast ordered to establish a baseline and evaluate for hippocampal changes, frontal lobe changes .  BMET added later - 02-28-13 .

## 2013-02-21 DIAGNOSIS — M4120 Other idiopathic scoliosis, site unspecified: Secondary | ICD-10-CM | POA: Diagnosis not present

## 2013-02-28 ENCOUNTER — Telehealth: Payer: Self-pay | Admitting: Neurology

## 2013-02-28 NOTE — Addendum Note (Signed)
Addended by: Melvyn Novas on: 02/28/2013 11:12 AM   Modules accepted: Orders

## 2013-02-28 NOTE — Telephone Encounter (Signed)
Gave message to Dr Vickey Huger to put order in for labs(creatine level ck) before MRI, Called Tasha to inform

## 2013-03-04 ENCOUNTER — Ambulatory Visit (HOSPITAL_COMMUNITY): Admission: RE | Admit: 2013-03-04 | Payer: 59 | Source: Ambulatory Visit

## 2013-03-15 ENCOUNTER — Ambulatory Visit (HOSPITAL_COMMUNITY)
Admission: RE | Admit: 2013-03-15 | Discharge: 2013-03-15 | Disposition: A | Discharge status: Home / Self Care | Payer: 59 | Source: Ambulatory Visit | Attending: Neurology | Admitting: Neurology

## 2013-03-15 DIAGNOSIS — R5381 Other malaise: Secondary | ICD-10-CM | POA: Diagnosis not present

## 2013-03-15 DIAGNOSIS — R42 Dizziness and giddiness: Secondary | ICD-10-CM | POA: Diagnosis not present

## 2013-03-15 DIAGNOSIS — H539 Unspecified visual disturbance: Secondary | ICD-10-CM

## 2013-03-15 DIAGNOSIS — IMO0001 Reserved for inherently not codable concepts without codable children: Secondary | ICD-10-CM

## 2013-03-15 DIAGNOSIS — R5383 Other fatigue: Secondary | ICD-10-CM | POA: Insufficient documentation

## 2013-03-15 LAB — CREATININE, SERUM
Creatinine, Ser: 0.9 mg/dL (ref 0.50–1.10)
GFR calc Af Amer: 74 mL/min — ABNORMAL LOW (ref >90)
GFR calc non Af Amer: 64 mL/min — ABNORMAL LOW (ref >90)

## 2013-03-15 LAB — BUN: BUN: 23 mg/dL (ref 6–23)

## 2013-03-15 MED ORDER — GADOBENATE DIMEGLUMINE 529 MG/ML IV SOLN
15.0000 mL | Freq: Once | INTRAVENOUS | Status: AC
  Administered 2013-03-15: 15 mL via INTRAVENOUS

## 2013-03-18 ENCOUNTER — Telehealth: Payer: Self-pay | Admitting: Neurology

## 2013-03-18 NOTE — Telephone Encounter (Signed)
Dear Mrs. Savin, your recent creatinine clearance and BUN have been in normal range, but the estimated renal clearance appeared lower than desired.   I thank you for undergoing the MRI study with gadolinium , this will be your last with contrast agent for a long time. Please note that you have a normal unchanged MRI brain image , according to the neuro - radiologist.  No lesions suggestive of CADASIL or MS. !!

## 2013-03-20 NOTE — Progress Notes (Signed)
Quick Note:  Spoke to patient and relayed information, but she had already had MRI. She would like Dr. Vickey Huger to know that she has not had another incident since she was seen. Also she hasn't started the Neurontin yet because she was away and then in the office seeing patient's, she has allergies to medicines so will start over the holidays. Patient requested labs and MRI report, which I will mail.  ______

## 2013-03-29 ENCOUNTER — Ambulatory Visit: Payer: 59 | Admitting: Nurse Practitioner

## 2013-05-09 ENCOUNTER — Other Ambulatory Visit: Payer: Self-pay

## 2013-05-09 DIAGNOSIS — Z1231 Encounter for screening mammogram for malignant neoplasm of breast: Secondary | ICD-10-CM

## 2013-05-10 DIAGNOSIS — D235 Other benign neoplasm of skin of trunk: Secondary | ICD-10-CM | POA: Diagnosis not present

## 2013-05-10 DIAGNOSIS — R21 Rash and other nonspecific skin eruption: Secondary | ICD-10-CM | POA: Diagnosis not present

## 2013-05-10 DIAGNOSIS — D239 Other benign neoplasm of skin, unspecified: Secondary | ICD-10-CM | POA: Diagnosis not present

## 2013-05-10 DIAGNOSIS — L820 Inflamed seborrheic keratosis: Secondary | ICD-10-CM | POA: Diagnosis not present

## 2013-05-10 DIAGNOSIS — L819 Disorder of pigmentation, unspecified: Secondary | ICD-10-CM | POA: Diagnosis not present

## 2013-05-10 DIAGNOSIS — L821 Other seborrheic keratosis: Secondary | ICD-10-CM | POA: Diagnosis not present

## 2013-05-13 DIAGNOSIS — H04129 Dry eye syndrome of unspecified lacrimal gland: Secondary | ICD-10-CM | POA: Diagnosis not present

## 2013-05-13 DIAGNOSIS — H5310 Unspecified subjective visual disturbances: Secondary | ICD-10-CM | POA: Diagnosis not present

## 2013-05-13 DIAGNOSIS — H3530 Unspecified macular degeneration: Secondary | ICD-10-CM | POA: Diagnosis not present

## 2013-06-07 ENCOUNTER — Ambulatory Visit: Payer: 59

## 2013-07-07 ENCOUNTER — Encounter: Payer: Self-pay | Admitting: *Deleted

## 2013-09-18 ENCOUNTER — Encounter: Payer: Self-pay | Admitting: Internal Medicine

## 2013-09-24 ENCOUNTER — Ambulatory Visit
Admission: RE | Admit: 2013-09-24 | Discharge: 2013-09-24 | Disposition: A | Discharge status: Home / Self Care | Payer: 59 | Source: Ambulatory Visit

## 2013-09-24 DIAGNOSIS — Z1231 Encounter for screening mammogram for malignant neoplasm of breast: Secondary | ICD-10-CM

## 2013-09-25 ENCOUNTER — Ambulatory Visit: Payer: 59

## 2013-09-25 DIAGNOSIS — IMO0001 Reserved for inherently not codable concepts without codable children: Secondary | ICD-10-CM | POA: Diagnosis not present

## 2013-09-25 DIAGNOSIS — F988 Other specified behavioral and emotional disorders with onset usually occurring in childhood and adolescence: Secondary | ICD-10-CM | POA: Diagnosis not present

## 2013-09-25 DIAGNOSIS — F909 Attention-deficit hyperactivity disorder, unspecified type: Secondary | ICD-10-CM | POA: Diagnosis not present

## 2013-09-25 DIAGNOSIS — F329 Major depressive disorder, single episode, unspecified: Secondary | ICD-10-CM | POA: Diagnosis not present

## 2013-09-25 DIAGNOSIS — F068 Other specified mental disorders due to known physiological condition: Secondary | ICD-10-CM | POA: Diagnosis not present

## 2013-09-25 DIAGNOSIS — F3289 Other specified depressive episodes: Secondary | ICD-10-CM | POA: Diagnosis not present

## 2013-09-27 DIAGNOSIS — I10 Essential (primary) hypertension: Secondary | ICD-10-CM | POA: Diagnosis not present

## 2013-09-27 DIAGNOSIS — E039 Hypothyroidism, unspecified: Secondary | ICD-10-CM | POA: Diagnosis not present

## 2013-09-27 DIAGNOSIS — G894 Chronic pain syndrome: Secondary | ICD-10-CM | POA: Diagnosis not present

## 2013-09-27 DIAGNOSIS — R7309 Other abnormal glucose: Secondary | ICD-10-CM | POA: Diagnosis not present

## 2013-09-27 DIAGNOSIS — E559 Vitamin D deficiency, unspecified: Secondary | ICD-10-CM | POA: Diagnosis not present

## 2013-09-27 DIAGNOSIS — R5381 Other malaise: Secondary | ICD-10-CM | POA: Diagnosis not present

## 2013-09-27 DIAGNOSIS — E785 Hyperlipidemia, unspecified: Secondary | ICD-10-CM | POA: Diagnosis not present

## 2013-09-27 DIAGNOSIS — R5383 Other fatigue: Secondary | ICD-10-CM | POA: Diagnosis not present

## 2014-01-03 DIAGNOSIS — I10 Essential (primary) hypertension: Secondary | ICD-10-CM | POA: Diagnosis not present

## 2014-01-03 DIAGNOSIS — E559 Vitamin D deficiency, unspecified: Secondary | ICD-10-CM | POA: Diagnosis not present

## 2014-01-03 DIAGNOSIS — R5383 Other fatigue: Secondary | ICD-10-CM | POA: Diagnosis not present

## 2014-01-03 DIAGNOSIS — E039 Hypothyroidism, unspecified: Secondary | ICD-10-CM | POA: Diagnosis not present

## 2014-01-03 DIAGNOSIS — R7309 Other abnormal glucose: Secondary | ICD-10-CM | POA: Diagnosis not present

## 2014-01-03 DIAGNOSIS — G894 Chronic pain syndrome: Secondary | ICD-10-CM | POA: Diagnosis not present

## 2014-01-03 DIAGNOSIS — E785 Hyperlipidemia, unspecified: Secondary | ICD-10-CM | POA: Diagnosis not present

## 2014-01-03 DIAGNOSIS — R5381 Other malaise: Secondary | ICD-10-CM | POA: Diagnosis not present

## 2014-01-08 DIAGNOSIS — E785 Hyperlipidemia, unspecified: Secondary | ICD-10-CM | POA: Diagnosis not present

## 2014-01-08 DIAGNOSIS — I10 Essential (primary) hypertension: Secondary | ICD-10-CM | POA: Diagnosis not present

## 2014-01-08 DIAGNOSIS — E039 Hypothyroidism, unspecified: Secondary | ICD-10-CM | POA: Diagnosis not present

## 2014-01-08 DIAGNOSIS — E559 Vitamin D deficiency, unspecified: Secondary | ICD-10-CM | POA: Diagnosis not present

## 2014-02-26 ENCOUNTER — Other Ambulatory Visit: Payer: Self-pay | Admitting: Obstetrics and Gynecology

## 2014-02-26 ENCOUNTER — Other Ambulatory Visit (HOSPITAL_COMMUNITY)
Admission: RE | Admit: 2014-02-26 | Discharge: 2014-02-26 | Disposition: A | Discharge status: Home / Self Care | Payer: 59 | Source: Ambulatory Visit | Attending: Obstetrics and Gynecology | Admitting: Obstetrics and Gynecology

## 2014-02-26 DIAGNOSIS — Z124 Encounter for screening for malignant neoplasm of cervix: Secondary | ICD-10-CM | POA: Insufficient documentation

## 2014-02-27 LAB — CYTOLOGY - PAP

## 2014-05-23 DIAGNOSIS — D2271 Melanocytic nevi of right lower limb, including hip: Secondary | ICD-10-CM | POA: Diagnosis not present

## 2014-05-23 DIAGNOSIS — D2272 Melanocytic nevi of left lower limb, including hip: Secondary | ICD-10-CM | POA: Diagnosis not present

## 2014-05-23 DIAGNOSIS — L821 Other seborrheic keratosis: Secondary | ICD-10-CM | POA: Diagnosis not present

## 2014-05-23 DIAGNOSIS — L814 Other melanin hyperpigmentation: Secondary | ICD-10-CM | POA: Diagnosis not present

## 2014-05-23 DIAGNOSIS — L820 Inflamed seborrheic keratosis: Secondary | ICD-10-CM | POA: Diagnosis not present

## 2014-05-23 DIAGNOSIS — D1801 Hemangioma of skin and subcutaneous tissue: Secondary | ICD-10-CM | POA: Diagnosis not present

## 2014-10-17 ENCOUNTER — Encounter: Payer: Self-pay | Admitting: Gastroenterology

## 2014-10-17 ENCOUNTER — Encounter: Payer: Self-pay | Admitting: Internal Medicine

## 2014-11-04 ENCOUNTER — Ambulatory Visit: Payer: 59 | Admitting: Physical Therapy

## 2014-11-18 ENCOUNTER — Ambulatory Visit: Payer: 59

## 2015-02-18 DIAGNOSIS — M533 Sacrococcygeal disorders, not elsewhere classified: Secondary | ICD-10-CM | POA: Diagnosis not present

## 2015-02-18 DIAGNOSIS — M5441 Lumbago with sciatica, right side: Secondary | ICD-10-CM | POA: Diagnosis not present

## 2015-02-18 DIAGNOSIS — M4120 Other idiopathic scoliosis, site unspecified: Secondary | ICD-10-CM | POA: Diagnosis not present

## 2015-02-18 DIAGNOSIS — G8929 Other chronic pain: Secondary | ICD-10-CM | POA: Diagnosis not present

## 2015-02-18 DIAGNOSIS — M797 Fibromyalgia: Secondary | ICD-10-CM | POA: Diagnosis not present

## 2015-03-03 ENCOUNTER — Other Ambulatory Visit (HOSPITAL_COMMUNITY)
Admission: RE | Admit: 2015-03-03 | Discharge: 2015-03-03 | Disposition: A | Discharge status: Home / Self Care | Payer: 59 | Source: Ambulatory Visit | Attending: Obstetrics and Gynecology | Admitting: Obstetrics and Gynecology

## 2015-03-03 ENCOUNTER — Other Ambulatory Visit: Payer: Self-pay | Admitting: Obstetrics and Gynecology

## 2015-03-03 DIAGNOSIS — Z01419 Encounter for gynecological examination (general) (routine) without abnormal findings: Secondary | ICD-10-CM | POA: Insufficient documentation

## 2015-03-09 ENCOUNTER — Other Ambulatory Visit: Payer: Self-pay | Admitting: Obstetrics and Gynecology

## 2015-03-09 DIAGNOSIS — N644 Mastodynia: Secondary | ICD-10-CM

## 2015-03-27 LAB — CYTOLOGY - PAP

## 2015-04-14 DIAGNOSIS — E2839 Other primary ovarian failure: Secondary | ICD-10-CM | POA: Diagnosis not present

## 2015-04-14 DIAGNOSIS — M8589 Other specified disorders of bone density and structure, multiple sites: Secondary | ICD-10-CM | POA: Diagnosis not present

## 2015-04-27 MED FILL — RESTASIS 0.05% EYE EMULSION: 0.05 | 30 days supply | Qty: 60 | Fill #0

## 2015-04-29 DIAGNOSIS — M797 Fibromyalgia: Secondary | ICD-10-CM | POA: Diagnosis not present

## 2015-04-29 DIAGNOSIS — M359 Systemic involvement of connective tissue, unspecified: Secondary | ICD-10-CM | POA: Diagnosis not present

## 2015-04-29 DIAGNOSIS — M791 Myalgia: Secondary | ICD-10-CM | POA: Diagnosis not present

## 2015-04-29 DIAGNOSIS — M2550 Pain in unspecified joint: Secondary | ICD-10-CM | POA: Diagnosis not present

## 2015-05-13 DIAGNOSIS — E782 Mixed hyperlipidemia: Secondary | ICD-10-CM | POA: Diagnosis not present

## 2015-05-13 DIAGNOSIS — R7302 Impaired glucose tolerance (oral): Secondary | ICD-10-CM | POA: Diagnosis not present

## 2015-05-13 DIAGNOSIS — E559 Vitamin D deficiency, unspecified: Secondary | ICD-10-CM | POA: Diagnosis not present

## 2015-05-13 DIAGNOSIS — E038 Other specified hypothyroidism: Secondary | ICD-10-CM | POA: Diagnosis not present

## 2015-05-15 DIAGNOSIS — E038 Other specified hypothyroidism: Secondary | ICD-10-CM | POA: Diagnosis not present

## 2015-05-15 DIAGNOSIS — E782 Mixed hyperlipidemia: Secondary | ICD-10-CM | POA: Diagnosis not present

## 2015-05-15 DIAGNOSIS — R7302 Impaired glucose tolerance (oral): Secondary | ICD-10-CM | POA: Diagnosis not present

## 2015-05-15 DIAGNOSIS — S29012A Strain of muscle and tendon of back wall of thorax, initial encounter: Secondary | ICD-10-CM | POA: Diagnosis not present

## 2015-05-15 DIAGNOSIS — I10 Essential (primary) hypertension: Secondary | ICD-10-CM | POA: Diagnosis not present

## 2015-05-15 DIAGNOSIS — G894 Chronic pain syndrome: Secondary | ICD-10-CM | POA: Diagnosis not present

## 2015-05-15 DIAGNOSIS — R5382 Chronic fatigue, unspecified: Secondary | ICD-10-CM | POA: Diagnosis not present

## 2015-05-15 DIAGNOSIS — E559 Vitamin D deficiency, unspecified: Secondary | ICD-10-CM | POA: Diagnosis not present

## 2015-05-15 MED FILL — SYNTHROID 25 MCG TABLET: 25 | 90 days supply | Qty: 90 | Fill #0

## 2015-05-19 MED FILL — DEPLIN-ALGAL OIL 15 MG CAP: 15-90.314 | 90 days supply | Qty: 90 | Fill #0

## 2015-06-12 ENCOUNTER — Inpatient Hospital Stay (HOSPITAL_COMMUNITY)
Admission: AC | Admit: 2015-06-12 | Discharge: 2015-06-22 | DRG: 454 | Disposition: A | Discharge status: Rehabilitation Facility | Payer: 59 | Source: Ambulatory Visit | Attending: Neurological Surgery | Admitting: Neurological Surgery

## 2015-06-12 DIAGNOSIS — G894 Chronic pain syndrome: Secondary | ICD-10-CM | POA: Insufficient documentation

## 2015-06-12 DIAGNOSIS — D72829 Elevated white blood cell count, unspecified: Secondary | ICD-10-CM | POA: Insufficient documentation

## 2015-06-12 DIAGNOSIS — K219 Gastro-esophageal reflux disease without esophagitis: Secondary | ICD-10-CM | POA: Diagnosis present

## 2015-06-12 DIAGNOSIS — I9581 Postprocedural hypotension: Secondary | ICD-10-CM

## 2015-06-12 DIAGNOSIS — K5900 Constipation, unspecified: Secondary | ICD-10-CM | POA: Diagnosis not present

## 2015-06-12 DIAGNOSIS — Z419 Encounter for procedure for purposes other than remedying health state, unspecified: Secondary | ICD-10-CM

## 2015-06-12 DIAGNOSIS — D62 Acute posthemorrhagic anemia: Secondary | ICD-10-CM | POA: Diagnosis not present

## 2015-06-12 DIAGNOSIS — I10 Essential (primary) hypertension: Secondary | ICD-10-CM | POA: Diagnosis not present

## 2015-06-12 DIAGNOSIS — E611 Iron deficiency: Secondary | ICD-10-CM | POA: Diagnosis not present

## 2015-06-12 DIAGNOSIS — F419 Anxiety disorder, unspecified: Secondary | ICD-10-CM | POA: Diagnosis not present

## 2015-06-12 DIAGNOSIS — T380X5A Adverse effect of glucocorticoids and synthetic analogues, initial encounter: Secondary | ICD-10-CM | POA: Diagnosis not present

## 2015-06-12 DIAGNOSIS — R34 Anuria and oliguria: Secondary | ICD-10-CM | POA: Diagnosis not present

## 2015-06-12 DIAGNOSIS — Q211 Atrial septal defect: Secondary | ICD-10-CM

## 2015-06-12 DIAGNOSIS — Z09 Encounter for follow-up examination after completed treatment for conditions other than malignant neoplasm: Secondary | ICD-10-CM

## 2015-06-12 DIAGNOSIS — M25551 Pain in right hip: Secondary | ICD-10-CM

## 2015-06-12 DIAGNOSIS — R0689 Other abnormalities of breathing: Secondary | ICD-10-CM | POA: Insufficient documentation

## 2015-06-12 DIAGNOSIS — M4126 Other idiopathic scoliosis, lumbar region: Secondary | ICD-10-CM | POA: Diagnosis not present

## 2015-06-12 DIAGNOSIS — M549 Dorsalgia, unspecified: Secondary | ICD-10-CM | POA: Diagnosis present

## 2015-06-12 DIAGNOSIS — R0600 Dyspnea, unspecified: Secondary | ICD-10-CM

## 2015-06-12 DIAGNOSIS — R5383 Other fatigue: Secondary | ICD-10-CM | POA: Insufficient documentation

## 2015-06-12 DIAGNOSIS — Z981 Arthrodesis status: Secondary | ICD-10-CM | POA: Insufficient documentation

## 2015-06-12 DIAGNOSIS — M4185 Other forms of scoliosis, thoracolumbar region: Secondary | ICD-10-CM | POA: Diagnosis not present

## 2015-06-12 DIAGNOSIS — R739 Hyperglycemia, unspecified: Secondary | ICD-10-CM

## 2015-06-12 DIAGNOSIS — M419 Scoliosis, unspecified: Secondary | ICD-10-CM | POA: Diagnosis present

## 2015-06-12 DIAGNOSIS — M4306 Spondylolysis, lumbar region: Secondary | ICD-10-CM | POA: Diagnosis not present

## 2015-06-12 DIAGNOSIS — M4186 Other forms of scoliosis, lumbar region: Principal | ICD-10-CM | POA: Diagnosis present

## 2015-06-12 DIAGNOSIS — Z01818 Encounter for other preprocedural examination: Secondary | ICD-10-CM

## 2015-06-12 DIAGNOSIS — G8918 Other acute postprocedural pain: Secondary | ICD-10-CM | POA: Insufficient documentation

## 2015-06-12 DIAGNOSIS — M797 Fibromyalgia: Secondary | ICD-10-CM | POA: Diagnosis present

## 2015-06-12 DIAGNOSIS — Z96651 Presence of right artificial knee joint: Secondary | ICD-10-CM | POA: Diagnosis present

## 2015-06-12 DIAGNOSIS — M5416 Radiculopathy, lumbar region: Secondary | ICD-10-CM | POA: Diagnosis not present

## 2015-06-12 DIAGNOSIS — Z7722 Contact with and (suspected) exposure to environmental tobacco smoke (acute) (chronic): Secondary | ICD-10-CM | POA: Diagnosis present

## 2015-06-12 DIAGNOSIS — M545 Low back pain: Secondary | ICD-10-CM | POA: Diagnosis not present

## 2015-06-12 DIAGNOSIS — Z79899 Other long term (current) drug therapy: Secondary | ICD-10-CM

## 2015-06-12 MED ORDER — MORPHINE SULFATE (PF) 2 MG/ML IV SOLN: 1.0000 mg | INTRAVENOUS | Status: DC | PRN

## 2015-06-12 MED ORDER — LEVOTHYROXINE SODIUM 25 MCG PO TABS
25.0000 ug | ORAL_TABLET | Freq: Every day | ORAL | Status: DC
  Administered 2015-06-12 – 2015-06-21 (×9): 25 ug via ORAL
  Filled 2015-06-12 (×11): qty 1

## 2015-06-12 MED ORDER — FLEET ENEMA 7-19 GM/118ML RE ENEM: 1.0000 | ENEMA | Freq: Once | RECTAL | Status: DC | PRN

## 2015-06-12 MED ORDER — KETOCONAZOLE 2 % EX CREA
1.0000 "application " | TOPICAL_CREAM | Freq: Two times a day (BID) | CUTANEOUS | Status: DC
  Administered 2015-06-13 – 2015-06-22 (×7): 1 via TOPICAL
  Filled 2015-06-12 (×2): qty 15

## 2015-06-12 MED ORDER — GABAPENTIN 100 MG PO CAPS
100.0000 mg | ORAL_CAPSULE | Freq: Two times a day (BID) | ORAL | Status: DC
  Administered 2015-06-12 – 2015-06-22 (×18): 100 mg via ORAL
  Filled 2015-06-12 (×21): qty 1

## 2015-06-12 MED ORDER — LISINOPRIL 2.5 MG PO TABS
1.2500 mg | ORAL_TABLET | Freq: Every morning | ORAL | Status: DC
  Administered 2015-06-13 – 2015-06-18 (×4): 1.25 mg via ORAL
  Filled 2015-06-12 (×3): qty 1

## 2015-06-12 MED ORDER — METHYLPHENIDATE 10 MG/9HR TD PTCH: 10.0000 mg | MEDICATED_PATCH | Freq: Every day | TRANSDERMAL | Status: DC | PRN

## 2015-06-12 MED ORDER — SENNA 8.6 MG PO TABS
1.0000 | ORAL_TABLET | Freq: Two times a day (BID) | ORAL | Status: DC
Start: 2015-06-12 — End: 2015-06-22
  Administered 2015-06-12 – 2015-06-22 (×18): 8.6 mg via ORAL
  Filled 2015-06-12 (×19): qty 1

## 2015-06-12 MED ORDER — ESTROGENS CONJUGATED 0.3 MG PO TABS
0.3000 mg | ORAL_TABLET | Freq: Every day | ORAL | Status: DC
  Administered 2015-06-13 – 2015-06-22 (×9): 0.3 mg via ORAL
  Filled 2015-06-12 (×19): qty 1

## 2015-06-12 MED ORDER — CYCLOSPORINE 0.05 % OP EMUL
1.0000 [drp] | Freq: Two times a day (BID) | OPHTHALMIC | Status: DC
  Administered 2015-06-13 – 2015-06-22 (×16): 1 [drp] via OPHTHALMIC
  Filled 2015-06-12 (×19): qty 1

## 2015-06-12 MED ORDER — ENOXAPARIN SODIUM 40 MG/0.4ML ~~LOC~~ SOLN
40.0000 mg | SUBCUTANEOUS | Status: DC
  Administered 2015-06-13 – 2015-06-18 (×5): 40 mg via SUBCUTANEOUS
  Filled 2015-06-12 (×6): qty 0.4

## 2015-06-12 MED ORDER — PRAVASTATIN SODIUM 40 MG PO TABS
40.0000 mg | ORAL_TABLET | Freq: Every day | ORAL | Status: DC
  Administered 2015-06-12 – 2015-06-21 (×9): 40 mg via ORAL
  Filled 2015-06-12 (×10): qty 1

## 2015-06-12 MED ORDER — FAMOTIDINE 20 MG PO TABS
20.0000 mg | ORAL_TABLET | Freq: Every day | ORAL | Status: DC
  Administered 2015-06-13 – 2015-06-21 (×8): 20 mg via ORAL
  Filled 2015-06-12 (×10): qty 1

## 2015-06-12 MED ORDER — PANTOPRAZOLE SODIUM 20 MG PO TBEC
20.0000 mg | DELAYED_RELEASE_TABLET | Freq: Every day | ORAL | Status: DC
  Administered 2015-06-13 – 2015-06-22 (×9): 20 mg via ORAL
  Filled 2015-06-12 (×19): qty 1

## 2015-06-12 MED ORDER — METHYLPHENIDATE 15 MG/9HR TD PTCH: 15.0000 mg | MEDICATED_PATCH | Freq: Every day | TRANSDERMAL | Status: DC

## 2015-06-12 MED ORDER — BISACODYL 10 MG RE SUPP
10.0000 mg | Freq: Every day | RECTAL | Status: DC | PRN
  Filled 2015-06-12: qty 1

## 2015-06-12 MED ORDER — DIAZEPAM 2 MG PO TABS
2.0000 mg | ORAL_TABLET | Freq: Four times a day (QID) | ORAL | Status: DC | PRN
  Administered 2015-06-12 – 2015-06-22 (×17): 2 mg via ORAL
  Filled 2015-06-12 (×19): qty 1

## 2015-06-12 MED ORDER — POLYETHYLENE GLYCOL 3350 17 G PO PACK: 17.0000 g | PACK | Freq: Every day | ORAL | Status: DC | PRN

## 2015-06-12 MED ORDER — ALUM & MAG HYDROXIDE-SIMETH 200-200-20 MG/5ML PO SUSP: 30.0000 mL | Freq: Four times a day (QID) | ORAL | Status: DC | PRN

## 2015-06-12 MED ORDER — ALBUTEROL SULFATE (2.5 MG/3ML) 0.083% IN NEBU: 3.0000 mL | INHALATION_SOLUTION | Freq: Four times a day (QID) | RESPIRATORY_TRACT | Status: DC | PRN

## 2015-06-12 MED ORDER — DOCUSATE SODIUM 100 MG PO CAPS
100.0000 mg | ORAL_CAPSULE | Freq: Two times a day (BID) | ORAL | Status: DC
  Administered 2015-06-12 – 2015-06-22 (×18): 100 mg via ORAL
  Filled 2015-06-12 (×19): qty 1

## 2015-06-12 MED ORDER — VITAMIN B-6 100 MG PO TABS
200.0000 mg | ORAL_TABLET | ORAL | Status: DC
  Administered 2015-06-13 – 2015-06-21 (×5): 200 mg via ORAL
  Filled 2015-06-12 (×10): qty 2

## 2015-06-12 MED ORDER — HYDROCODONE-ACETAMINOPHEN 5-325 MG PO TABS
1.0000 | ORAL_TABLET | ORAL | Status: DC | PRN
  Administered 2015-06-12 (×2): 1 via ORAL
  Administered 2015-06-13 – 2015-06-20 (×29): 2 via ORAL
  Filled 2015-06-12 (×2): qty 2
  Filled 2015-06-12: qty 1
  Filled 2015-06-12: qty 2
  Filled 2015-06-12: qty 1
  Filled 2015-06-12 (×23): qty 2
  Filled 2015-06-12: qty 1
  Filled 2015-06-12 (×3): qty 2

## 2015-06-12 MED ORDER — FENOFIBRATE 54 MG PO TABS
54.0000 mg | ORAL_TABLET | Freq: Every day | ORAL | Status: DC
  Administered 2015-06-13 – 2015-06-22 (×9): 54 mg via ORAL
  Filled 2015-06-12 (×19): qty 1

## 2015-06-12 MED ORDER — ESCITALOPRAM OXALATE 10 MG PO TABS
10.0000 mg | ORAL_TABLET | Freq: Two times a day (BID) | ORAL | Status: DC
  Administered 2015-06-12 – 2015-06-22 (×18): 10 mg via ORAL
  Filled 2015-06-12 (×19): qty 1

## 2015-06-12 MED ORDER — DIAZEPAM 5 MG/ML IJ SOLN
2.5000 mg | INTRAMUSCULAR | Status: DC | PRN
  Administered 2015-06-14 – 2015-06-17 (×10): 2.5 mg via INTRAVENOUS
  Filled 2015-06-12 (×10): qty 2

## 2015-06-12 NOTE — Progress Notes (Signed)
Patient arrived to 5M18 AAOx4 and in discomfort. Patient missing many of her medications in her chart. She is requesting ice and pain meds. Waiting on Dr. Ellene Route to arrive to see the patient. Will continue to monitor. Cleola Perryman, Rande Brunt, RN

## 2015-06-12 NOTE — H&P (Signed)
Rita Gregory is an 70 y.o. female.   Chief Complaint: Intractable back pain right hip and leg pain HPI: Patient is 70 year old individual whom I been following for degenerative scoliosis of lumbar spine for number of years now. She has had progressively deteriorating level of function but in the past week or 2 the pain has become severe and intractable. Her husband contacted me yesterday indicating that she was not able to get out of the bed very easily requiring help with simple activities of daily living. She had been scheduled for an epidural steroid injection which was performed this morning. After the injection she notes that she had very little relief is difficult to mobilize her out of bed to the chair. She is now being admitted to the hospital for further evaluation and treatment of her pain. An MRI will be ordered urgently.  Past Medical History  Diagnosis Date  . Diverticulosis of colon (without mention of hemorrhage) 11/09/2007  . Colon polyp 11/09/2007    Hyperplastic   . Esophageal reflux   . Fibromyalgia   . Hypertension   . Kyphoscoliosis     40 degree bend ro right side  . Chronic pain     Dr. Ellene Route follows  . Patent foramen ovale     hx.- Dr. Lemmie Evens. Smith,cardiology- follows  . Migraine   . Paroxysmal spells 02/13/2013    Past Surgical History  Procedure Laterality Date  . Appendectomy    . Cholecystectomy    . Abdominal hysterectomy    . Joint replacement      RTKA '05  . Knee arthroscopy w/ acl reconstruction      Rt. Knee  . Dry eyes      uses Restasis daily  . Cesarean section      x2  . Right rotator cuff  2011    sx  . Esophagogastroduodenoscopy  02/23/2012    Procedure: ESOPHAGOGASTRODUODENOSCOPY (EGD);  Surgeon: Irene Shipper, MD;  Location: [facility];  Service: Endoscopy;  Laterality: N/A;    Family History  Problem Relation Age of Onset  . Ovarian cancer Mother     passed at age 67  . Heart disease Father   . Kidney disease Father      Kidney cancer  . Stomach cancer Paternal Uncle   . Diabetes type II Father     passed at 67 yrs old  . Colon cancer Father     cancereous polyps  . Bipolar disorder Sister     Osteopenia  . Bipolar disorder Son    Social History:  reports that she has been passively smoking.  She does not have any smokeless tobacco history on file. She reports that she drinks about 0.5 - 1.5 oz of alcohol per week. She reports that she does not use illicit drugs.  Allergies:  Allergies  Allergen Reactions  . Fentanyl Anaphylaxis  . Morphine And Related Anaphylaxis    respiratory depression   . Bupropion Hcl     Dizzy, fog  . Codeine Nausea And Vomiting  . Demerol [Meperidine] Nausea And Vomiting  . Dextromethorphan Other (See Comments)    Unknown   . Guaifenesin & Derivatives     Other, unknown  . Lipitor [Atorvastatin]   . Lovaza [Omega-3-Acid Ethyl Esters] Diarrhea  . Lyrica [Pregabalin]     Fatigue and depression  . Naltrexone     depression  . Percodan [Oxycodone-Aspirin] Nausea And Vomiting  . Prilosec [Omeprazole]     Iliacs    .  Topamax [Topiramate]     disorientation   . Valium [Diazepam]     Small doses are OK but large doses are not. Long-term use causes depression.   . Versed [Midazolam]     In moderate in large doses shortness of breath, and respiratory depression   . Adhesive [Tape] Rash    Skin irritation   . Concerta [Methylphenidate] Palpitations  . Lamotrigine Rash  . Nickel Rash    Skin irritation   . Oxycodone-Acetaminophen Nausea And Vomiting and Rash    depression  . Percocet [Oxycodone-Acetaminophen] Nausea And Vomiting and Rash  . Tegaderm Ag Mesh [Silver] Rash    No prescriptions prior to admission    No results found for this or any previous visit (from the past 48 hour(s)). No results found.  Review of Systems  HENT: Negative.   Eyes: Negative.   Respiratory: Negative.   Cardiovascular: Negative.   Gastrointestinal: Negative.   Genitourinary:  Negative.   Musculoskeletal: Positive for back pain.  Skin: Negative.   Neurological: Positive for weakness.  Psychiatric/Behavioral: Negative.     There were no vitals taken for this visit. Physical Exam  Constitutional: She is oriented to person, place, and time. She appears well-developed and well-nourished.  HENT:  Head: Normocephalic and atraumatic.  Eyes: Conjunctivae and EOM are normal. Pupils are equal, round, and reactive to light.  Neck: Normal range of motion. Neck supple.  Cardiovascular: Normal rate and regular rhythm.   Respiratory: Effort normal and breath sounds normal.  GI: Soft. Bowel sounds are normal.  Musculoskeletal:  Wound moderate lumbar scoliotic curve observable with patient standing. Markedly tender throughout the lumbar spine. Positive straight leg raising on right at 15. Tibialis anterior and gastroc strength is intact bilaterally.  Neurological: She is alert and oriented to person, place, and time.  Cranial nerve and cerebellar exam is within the limits of normal. Absent deep 10 reflexes in the patellae and the Achilles are noted.  Skin: Skin is warm and dry.  Psychiatric: She has a normal mood and affect. Her behavior is normal. Judgment and thought content normal.     Assessment/Plan Severe intractable back pain with right lumbar radiculopathy. Patient has multiple allergies to various pain medications.  Parenteral pain control and obtain MRI of lumbar spine.  Earleen Newport, MD 06/12/2015, 7:42 PM

## 2015-06-12 NOTE — Progress Notes (Addendum)
Spoke with Dr. Christella Noa who updated patient's med list. Pt aware Dr. Ellene Route will not be back tonight. IV team attempted several times to get an IV but were unsuccessful. Will need to try again tomorrow morning. Patient refusing Lovenox this evening. MD is aware. Anivea Velasques, Rande Brunt, RN

## 2015-06-13 LAB — CBC
HCT: 43 % (ref 36.0–46.0)
Hemoglobin: 13.9 g/dL (ref 12.0–15.0)
MCH: 28.3 pg (ref 26.0–34.0)
MCHC: 32.3 g/dL (ref 30.0–36.0)
MCV: 87.6 fL (ref 78.0–100.0)
Platelets: 312 10*3/uL (ref 150–400)
RBC: 4.91 MIL/uL (ref 3.87–5.11)
RDW: 13.3 % (ref 11.5–15.5)
WBC: 8 10*3/uL (ref 4.0–10.5)

## 2015-06-13 LAB — CREATININE, SERUM
Creatinine, Ser: 0.55 mg/dL (ref 0.44–1.00)
GFR calc Af Amer: >60 mL/min (ref >60)
GFR calc non Af Amer: >60 mL/min (ref >60)

## 2015-06-13 MED ORDER — KETOROLAC TROMETHAMINE 15 MG/ML IJ SOLN
15.0000 mg | Freq: Four times a day (QID) | INTRAMUSCULAR | Status: AC | PRN
  Administered 2015-06-13 – 2015-06-14 (×3): 15 mg via INTRAVENOUS
  Filled 2015-06-13 (×3): qty 1

## 2015-06-13 NOTE — Progress Notes (Signed)
No issues overnight. Pt unable to go for MRI due to severe anxiety/claustraphobia. Cont to c/o severe back pain.  EXAM:  BP 137/84 mmHg  Pulse 72  Temp(Src) 97.1 F (36.2 C) (Oral)  Resp 16  SpO2 97%  Awake, alert, oriented  Speech fluent, appropriate  CN grossly intact  5/5 BUE/BLE   IMPRESSION:  70 y.o. female with degenerative scoliosis, intractable pain.  Anxiety/claustraophbia prevents MRI without anesthesia  PLAN: - Will get IV team for access - Anesthesia consult for MRI

## 2015-06-13 NOTE — Progress Notes (Signed)
During rounds, pt noted resting quietly. At this time, pt requests to use bedpan for voiding at this time. Pt verbalizes unable to use BSC d/t back pain radiates to legs. Ice pack given per requests. PT then decline bedpan and VS at this time until pain subside.RN offered to place SCDs, pt decline once again. NA at bedside. Will continue to monitor.

## 2015-06-13 NOTE — Progress Notes (Signed)
Patient decline MRI at this time verbalizing that she has to have an open MRI, and prefers to have her husband at bedside to for consult a this time if no open MRI available. Will consult MD and husband (if available) in AM.  Ave Filter, RN

## 2015-06-13 NOTE — Progress Notes (Signed)
SCDs at bedside, RN explained the function and SCDs. Pt declined. RN explained risks and benefits. PT still declined. Will continue to monitor.  Ave Filter, RN

## 2015-06-14 ENCOUNTER — Inpatient Hospital Stay (HOSPITAL_COMMUNITY): Payer: 59

## 2015-06-14 MED ORDER — WHITE PETROLATUM GEL
Status: AC
  Administered 2015-06-14: 22:00:00
  Filled 2015-06-14: qty 1

## 2015-06-14 MED ORDER — KETOROLAC TROMETHAMINE 15 MG/ML IJ SOLN
15.0000 mg | Freq: Four times a day (QID) | INTRAMUSCULAR | Status: AC | PRN
  Administered 2015-06-14 – 2015-06-16 (×5): 15 mg via INTRAVENOUS
  Filled 2015-06-14 (×5): qty 1

## 2015-06-14 NOTE — Progress Notes (Signed)
Patient has been getting ice packs filled via every hour and placed underneath her.....she stated that this is 50% of her pain control.  After putting patient on bedpan around 1am I noticed her skin was beat red, very cold to touch, and ice packs were on skin.  At this point she had a total of 5 ice packs underneath her.  I removed the ice packs and told her we would not replace them until the skin returned to normal color and was not cold to the touch.  We restarted the icepacks around 4:30pm and gave her two and placed them under the draw mat.

## 2015-06-14 NOTE — Progress Notes (Signed)
BP 134/78 mmHg  Pulse 61  Temp(Src) 98.1 F (36.7 C) (Oral)  Resp 18  SpO2 100% Alert and oriented x 4, speech is clear and fluent Moving all extremities well Has responded seemingly well to the toradol Awaiting mri No neurologic changes

## 2015-06-14 NOTE — Progress Notes (Signed)
I have had discussions with [name] pt's RN and the covering CRNA this am concerning Rita Gregory's MRI Lumbar with GA. Pt has been offered alternatives to GA by [employer], ie iv medications to help endure MRI. Pt is refusing our MRI ; is requesting an open MRI which we do not have in house.  Our larger bore has been explained to pt by [facility] AND pending husband's clearing for MRI safety if safe he is welcome to be in room with pt from MRI's end(as pt is requesting as well). Pt continues to request GA. Per [name] pt's husband is a Field seismologist and he went and viewed the Anesthesia schedule sat night and per him was told by the Anesthesiologist that they were called when needed. To avoid need for service recovery and ensure what was told to family happened I called and spoke with a CRNA this am. A discussion between today's Anesthesiologist and Dr Diamantina Monks took place as a result. Due to weekend staffing with Anesthesia being to accommodate emergent cases only this has been decided to beheld till Monday by these to physicians. Scheduling will be called Monday am to be placed to the anesthesia schedule by the MRI Dept. Pt's RN [name] aware. If we can be of further assistance to this pt prior to this please call [phone].

## 2015-06-14 NOTE — Progress Notes (Signed)
Pt awake at 0730 asking for prn pain medication. Advised pain medication not yet due. Was given at 0530 and updated patient white board on wall indicating next med due at 0930. Discussed with patient that pain control continues to be a priority issue.  Pt asking for more ice packs. Discussed pros and cons of cold therapy. Advised patient could refresh ice packs but will need another alternative between now and lunch. Pt agreeable. Wendee Copp

## 2015-06-15 ENCOUNTER — Inpatient Hospital Stay (HOSPITAL_COMMUNITY): Payer: 59

## 2015-06-15 ENCOUNTER — Encounter (HOSPITAL_COMMUNITY): Payer: Self-pay | Admitting: *Deleted

## 2015-06-15 ENCOUNTER — Encounter (HOSPITAL_COMMUNITY)
Admission: AC | Disposition: A | Discharge status: Rehabilitation Facility | Payer: Self-pay | Source: Ambulatory Visit | Attending: Neurological Surgery

## 2015-06-15 ENCOUNTER — Inpatient Hospital Stay (HOSPITAL_COMMUNITY): Payer: 59 | Admitting: Anesthesiology

## 2015-06-15 DIAGNOSIS — E611 Iron deficiency: Secondary | ICD-10-CM | POA: Diagnosis not present

## 2015-06-15 DIAGNOSIS — I10 Essential (primary) hypertension: Secondary | ICD-10-CM | POA: Diagnosis not present

## 2015-06-15 DIAGNOSIS — M4126 Other idiopathic scoliosis, lumbar region: Secondary | ICD-10-CM | POA: Diagnosis not present

## 2015-06-15 DIAGNOSIS — M545 Low back pain: Secondary | ICD-10-CM | POA: Diagnosis not present

## 2015-06-15 DIAGNOSIS — Z96651 Presence of right artificial knee joint: Secondary | ICD-10-CM | POA: Diagnosis present

## 2015-06-15 DIAGNOSIS — R34 Anuria and oliguria: Secondary | ICD-10-CM | POA: Diagnosis not present

## 2015-06-15 DIAGNOSIS — R5383 Other fatigue: Secondary | ICD-10-CM | POA: Diagnosis not present

## 2015-06-15 DIAGNOSIS — K5900 Constipation, unspecified: Secondary | ICD-10-CM | POA: Diagnosis not present

## 2015-06-15 DIAGNOSIS — E46 Unspecified protein-calorie malnutrition: Secondary | ICD-10-CM | POA: Diagnosis not present

## 2015-06-15 DIAGNOSIS — M549 Dorsalgia, unspecified: Secondary | ICD-10-CM | POA: Diagnosis not present

## 2015-06-15 DIAGNOSIS — R270 Ataxia, unspecified: Secondary | ICD-10-CM | POA: Diagnosis not present

## 2015-06-15 DIAGNOSIS — F419 Anxiety disorder, unspecified: Secondary | ICD-10-CM | POA: Diagnosis not present

## 2015-06-15 DIAGNOSIS — J9811 Atelectasis: Secondary | ICD-10-CM | POA: Diagnosis not present

## 2015-06-15 DIAGNOSIS — N390 Urinary tract infection, site not specified: Secondary | ICD-10-CM | POA: Diagnosis not present

## 2015-06-15 DIAGNOSIS — I9581 Postprocedural hypotension: Secondary | ICD-10-CM | POA: Diagnosis not present

## 2015-06-15 DIAGNOSIS — M25551 Pain in right hip: Secondary | ICD-10-CM | POA: Diagnosis not present

## 2015-06-15 DIAGNOSIS — M4806 Spinal stenosis, lumbar region: Secondary | ICD-10-CM | POA: Diagnosis not present

## 2015-06-15 DIAGNOSIS — M4324 Fusion of spine, thoracic region: Secondary | ICD-10-CM | POA: Diagnosis not present

## 2015-06-15 DIAGNOSIS — M4807 Spinal stenosis, lumbosacral region: Secondary | ICD-10-CM | POA: Diagnosis not present

## 2015-06-15 DIAGNOSIS — M4306 Spondylolysis, lumbar region: Secondary | ICD-10-CM | POA: Diagnosis not present

## 2015-06-15 DIAGNOSIS — K219 Gastro-esophageal reflux disease without esophagitis: Secondary | ICD-10-CM | POA: Diagnosis not present

## 2015-06-15 DIAGNOSIS — G894 Chronic pain syndrome: Secondary | ICD-10-CM | POA: Diagnosis not present

## 2015-06-15 DIAGNOSIS — Z79899 Other long term (current) drug therapy: Secondary | ICD-10-CM | POA: Diagnosis not present

## 2015-06-15 DIAGNOSIS — E8809 Other disorders of plasma-protein metabolism, not elsewhere classified: Secondary | ICD-10-CM | POA: Diagnosis not present

## 2015-06-15 DIAGNOSIS — Z981 Arthrodesis status: Secondary | ICD-10-CM | POA: Diagnosis not present

## 2015-06-15 DIAGNOSIS — M419 Scoliosis, unspecified: Secondary | ICD-10-CM | POA: Diagnosis not present

## 2015-06-15 DIAGNOSIS — Z01818 Encounter for other preprocedural examination: Secondary | ICD-10-CM | POA: Diagnosis not present

## 2015-06-15 DIAGNOSIS — Q211 Atrial septal defect: Secondary | ICD-10-CM | POA: Diagnosis not present

## 2015-06-15 DIAGNOSIS — R739 Hyperglycemia, unspecified: Secondary | ICD-10-CM | POA: Diagnosis not present

## 2015-06-15 DIAGNOSIS — G8929 Other chronic pain: Secondary | ICD-10-CM | POA: Diagnosis not present

## 2015-06-15 DIAGNOSIS — M4186 Other forms of scoliosis, lumbar region: Secondary | ICD-10-CM | POA: Diagnosis not present

## 2015-06-15 DIAGNOSIS — T380X5A Adverse effect of glucocorticoids and synthetic analogues, initial encounter: Secondary | ICD-10-CM | POA: Diagnosis not present

## 2015-06-15 DIAGNOSIS — R29898 Other symptoms and signs involving the musculoskeletal system: Secondary | ICD-10-CM | POA: Diagnosis not present

## 2015-06-15 DIAGNOSIS — K5903 Drug induced constipation: Secondary | ICD-10-CM | POA: Diagnosis not present

## 2015-06-15 DIAGNOSIS — M5416 Radiculopathy, lumbar region: Secondary | ICD-10-CM | POA: Diagnosis not present

## 2015-06-15 DIAGNOSIS — M797 Fibromyalgia: Secondary | ICD-10-CM | POA: Diagnosis not present

## 2015-06-15 DIAGNOSIS — Z7722 Contact with and (suspected) exposure to environmental tobacco smoke (acute) (chronic): Secondary | ICD-10-CM | POA: Diagnosis present

## 2015-06-15 DIAGNOSIS — N319 Neuromuscular dysfunction of bladder, unspecified: Secondary | ICD-10-CM | POA: Diagnosis not present

## 2015-06-15 DIAGNOSIS — D62 Acute posthemorrhagic anemia: Secondary | ICD-10-CM | POA: Diagnosis not present

## 2015-06-15 HISTORY — PX: RADIOLOGY WITH ANESTHESIA: SHX6223

## 2015-06-15 LAB — SURGICAL PCR SCREEN
MRSA, PCR: NEGATIVE
Staphylococcus aureus: NEGATIVE

## 2015-06-15 SURGERY — RADIOLOGY WITH ANESTHESIA
Anesthesia: General

## 2015-06-15 MED ORDER — LACTATED RINGERS IV SOLN
INTRAVENOUS | Status: DC
  Administered 2015-06-15: 13:00:00 via INTRAVENOUS

## 2015-06-15 MED ORDER — DIAZEPAM 5 MG/ML IJ SOLN
INTRAMUSCULAR | Status: AC
  Administered 2015-06-15: 2.5 mg via INTRAVENOUS
  Filled 2015-06-15: qty 2

## 2015-06-15 MED ORDER — HYDROCODONE-ACETAMINOPHEN 7.5-325 MG/15ML PO SOLN: 10.0000 mL | Freq: Four times a day (QID) | ORAL | Status: DC | PRN

## 2015-06-15 MED ORDER — HYDROCODONE-ACETAMINOPHEN 5-325 MG PO TABS
ORAL_TABLET | ORAL | Status: AC
  Administered 2015-06-15: 2 via ORAL
  Filled 2015-06-15: qty 2

## 2015-06-15 MED ORDER — HYDROCODONE-ACETAMINOPHEN 5-325 MG PO TABS
1.0000 | ORAL_TABLET | Freq: Once | ORAL | Status: AC
  Administered 2015-06-15: 2 via ORAL

## 2015-06-15 MED ORDER — KETOROLAC TROMETHAMINE 30 MG/ML IJ SOLN
INTRAMUSCULAR | Status: AC
  Administered 2015-06-15: 15 mg via INTRAVENOUS
  Filled 2015-06-15: qty 1

## 2015-06-15 MED ORDER — SODIUM CHLORIDE 0.9 % IV SOLN
INTRAVENOUS | Status: DC
  Administered 2015-06-16: 01:00:00 via INTRAVENOUS

## 2015-06-15 MED ORDER — DIAZEPAM 5 MG/ML IJ SOLN
2.5000 mg | Freq: Once | INTRAMUSCULAR | Status: AC
  Administered 2015-06-15: 2.5 mg via INTRAVENOUS

## 2015-06-15 MED ORDER — KETOROLAC TROMETHAMINE 30 MG/ML IJ SOLN
15.0000 mg | INTRAMUSCULAR | Status: AC
  Administered 2015-06-15 (×2): 15 mg via INTRAVENOUS

## 2015-06-15 MED ORDER — SODIUM CHLORIDE 0.9 % IV SOLN
0.0125 ug/kg/min | INTRAVENOUS | Status: DC
  Filled 2015-06-15: qty 2000

## 2015-06-15 NOTE — Progress Notes (Signed)
Patient ID: Rita Gregory, female   DOB: [birth year], 70 y.o.   MRN: [id number] Patient has been having significant and persistent pain in the right buttock hip and leg all weekend long. She would not tolerate a closed MRI with sedation. Pain management has been difficult because she has so much drug intolerance.  Today we obtained an MRI with general anesthesia. The study demonstrates that she has advancing scoliotic stenosis on the right side at L4-5 and L5-S1 and also at L1-L2 on the left side at L2-3 and L3-4 she has significant lateral recess stenosis. These problems appear chronic and worsening but no acute disc herniation is appreciated on the studies.  [name] has been having continued worsening over time with a deterioration that started some 6 months ago when I had seen her in August. Her husband notes that even before that she was having difficulties and was canceling trips and not getting out of the house nearly as much as she used to. This process seems to been evolving and worsening over the past number of months.  I believe that this time that if any surgical intervention is warranted it would require a decompression of the levels involved from T12 down to the sacrum. I discussed the nature of the surgery that we considered previously but given her poor functional status now I believe it's time to proceed with the surgical intervention.  I have requested that we do a CT scan of lumbar spine this evening in addition to a singular AP view of the pelvis. We'll plan on proceeding with surgery tomorrow. I will review the x-ray films this evening later.

## 2015-06-15 NOTE — Transfer of Care (Signed)
Immediate Anesthesia Transfer of Care Note  Patient: Rita Gregory  Procedure(s) Performed: Procedure(s): RADIOLOGY WITH ANESTHESIA (N/A)  Patient Location: PACU  Anesthesia Type:General  Level of Consciousness: awake, alert , oriented and patient cooperative  Airway & Oxygen Therapy: Patient Spontanous Breathing  Post-op Assessment: Report given to RN, Post -op Vital signs reviewed and stable, Patient moving all extremities and Patient moving all extremities X 4  Post vital signs: Reviewed and stable  Last Vitals:  Filed Vitals:   06/15/15 0911 06/15/15 1554  BP: 138/81   Pulse: 65   Temp: 36.7 C 36.5 C  Resp: 16     Complications: No apparent anesthesia complications

## 2015-06-15 NOTE — Anesthesia Preprocedure Evaluation (Addendum)
Anesthesia Evaluation  Patient identified by MRN, date of birth, ID band Patient awake    Reviewed: Allergy & Precautions, NPO status , Patient's Chart, lab work & pertinent test results  Airway Mallampati: I  TM Distance: >3 FB Neck ROM: Limited    Dental no notable dental hx. (+) Teeth Intact   Pulmonary asthma ,    Pulmonary exam normal breath sounds clear to auscultation       Cardiovascular hypertension, Pt. on medications Normal cardiovascular exam Rhythm:Regular Rate:Normal     Neuro/Psych  Headaches,  Neuromuscular disease negative psych ROS   GI/Hepatic Neg liver ROS, GERD  Medicated and Controlled,Diverticulosis   Endo/Other  Hypothyroidism   Renal/GU negative Renal ROS  negative genitourinary   Musculoskeletal  (+) Fibromyalgia -Intractable LBP Kyphoscoliosis   Abdominal   Peds  Hematology negative hematology ROS (+)   Anesthesia Other Findings   Reproductive/Obstetrics                            Anesthesia Physical Anesthesia Plan  ASA: III  Anesthesia Plan: General   Post-op Pain Management:    Induction: Intravenous  Airway Management Planned: LMA  Additional Equipment:   Intra-op Plan:   Post-operative Plan: Extubation in OR  Informed Consent: I have reviewed the patients History and Physical, chart, labs and discussed the procedure including the risks, benefits and alternatives for the proposed anesthesia with the patient or authorized representative who has indicated his/her understanding and acceptance.   Dental advisory given  Plan Discussed with: CRNA, Anesthesiologist and Surgeon  Anesthesia Plan Comments: (Remifentanyl drip for procedure)        Anesthesia Quick Evaluation

## 2015-06-16 ENCOUNTER — Observation Stay (HOSPITAL_COMMUNITY): Payer: 59 | Admitting: Certified Registered Nurse Anesthetist

## 2015-06-16 ENCOUNTER — Inpatient Hospital Stay (HOSPITAL_COMMUNITY): Payer: 59

## 2015-06-16 ENCOUNTER — Observation Stay (HOSPITAL_COMMUNITY): Payer: 59

## 2015-06-16 ENCOUNTER — Encounter (HOSPITAL_COMMUNITY): Payer: Self-pay | Admitting: Certified Registered Nurse Anesthetist

## 2015-06-16 ENCOUNTER — Encounter (HOSPITAL_COMMUNITY)
Admission: AC | Disposition: A | Discharge status: Rehabilitation Facility | Payer: Self-pay | Source: Ambulatory Visit | Attending: Neurological Surgery

## 2015-06-16 DIAGNOSIS — M4306 Spondylolysis, lumbar region: Secondary | ICD-10-CM | POA: Insufficient documentation

## 2015-06-16 HISTORY — PX: POSTERIOR LUMBAR FUSION 4 LEVEL: SHX6037

## 2015-06-16 HISTORY — PX: ANTERIOR LATERAL LUMBAR FUSION 4 LEVELS: SHX5552

## 2015-06-16 LAB — GLUCOSE, CAPILLARY: Glucose-Capillary: 212 mg/dL — ABNORMAL HIGH (ref 65–99)

## 2015-06-16 SURGERY — ANTERIOR LATERAL LUMBAR FUSION 4 LEVELS
Anesthesia: General | Site: Spine Lumbar

## 2015-06-16 MED ORDER — METHOCARBAMOL 1000 MG/10ML IJ SOLN
500.0000 mg | Freq: Four times a day (QID) | INTRAVENOUS | Status: DC | PRN
  Administered 2015-06-17: 500 mg via INTRAVENOUS
  Filled 2015-06-16 (×3): qty 5

## 2015-06-16 MED ORDER — SUGAMMADEX SODIUM 200 MG/2ML IV SOLN
INTRAVENOUS | Status: AC
  Filled 2015-06-16: qty 2

## 2015-06-16 MED ORDER — CEFAZOLIN SODIUM-DEXTROSE 2-3 GM-% IV SOLR
INTRAVENOUS | Status: DC | PRN
  Administered 2015-06-16 (×3): 2 g via INTRAVENOUS

## 2015-06-16 MED ORDER — LACTATED RINGERS IV SOLN
INTRAVENOUS | Status: DC | PRN
  Administered 2015-06-16 (×4): via INTRAVENOUS

## 2015-06-16 MED ORDER — ARTIFICIAL TEARS OP OINT
TOPICAL_OINTMENT | OPHTHALMIC | Status: DC | PRN
  Administered 2015-06-16: 1 via OPHTHALMIC

## 2015-06-16 MED ORDER — BUPIVACAINE HCL (PF) 0.5 % IJ SOLN
INTRAMUSCULAR | Status: DC | PRN
  Administered 2015-06-16: 18 mL
  Administered 2015-06-16: 4.5 mL
  Administered 2015-06-16: 5 mL

## 2015-06-16 MED ORDER — METHOCARBAMOL 500 MG PO TABS
500.0000 mg | ORAL_TABLET | Freq: Four times a day (QID) | ORAL | Status: DC | PRN
  Administered 2015-06-19 – 2015-06-22 (×6): 500 mg via ORAL
  Filled 2015-06-16 (×6): qty 1

## 2015-06-16 MED ORDER — SODIUM CHLORIDE 0.9 % IR SOLN
Status: DC | PRN
  Administered 2015-06-16: 500 mL

## 2015-06-16 MED ORDER — PROPOFOL 10 MG/ML IV BOLUS
INTRAVENOUS | Status: AC
  Filled 2015-06-16: qty 20

## 2015-06-16 MED ORDER — LACTATED RINGERS IV SOLN: INTRAVENOUS | Status: DC

## 2015-06-16 MED ORDER — ROCURONIUM BROMIDE 100 MG/10ML IV SOLN
INTRAVENOUS | Status: DC | PRN
  Administered 2015-06-16: 50 mg via INTRAVENOUS
  Administered 2015-06-16: 10 mg via INTRAVENOUS
  Administered 2015-06-16: 20 mg via INTRAVENOUS
  Administered 2015-06-16 (×5): 10 mg via INTRAVENOUS

## 2015-06-16 MED ORDER — SODIUM CHLORIDE 0.9 % IV SOLN: 250.0000 mL | INTRAVENOUS | Status: DC

## 2015-06-16 MED ORDER — LIDOCAINE HCL (CARDIAC) 20 MG/ML IV SOLN
INTRAVENOUS | Status: DC | PRN
  Administered 2015-06-16: 50 mg via INTRAVENOUS

## 2015-06-16 MED ORDER — MENTHOL 3 MG MT LOZG: 1.0000 | LOZENGE | OROMUCOSAL | Status: DC | PRN

## 2015-06-16 MED ORDER — FENTANYL CITRATE (PF) 250 MCG/5ML IJ SOLN
INTRAMUSCULAR | Status: AC
  Filled 2015-06-16: qty 5

## 2015-06-16 MED ORDER — LACTATED RINGERS IV SOLN
INTRAVENOUS | Status: DC | PRN
  Administered 2015-06-16: 10:00:00 via INTRAVENOUS

## 2015-06-16 MED ORDER — MAGNESIUM CITRATE PO SOLN
1.0000 | Freq: Once | ORAL | Status: DC | PRN
  Filled 2015-06-16: qty 296

## 2015-06-16 MED ORDER — ACETAMINOPHEN 325 MG PO TABS: 650.0000 mg | ORAL_TABLET | ORAL | Status: DC | PRN

## 2015-06-16 MED ORDER — PHENYLEPHRINE HCL 10 MG/ML IJ SOLN
10.0000 mg | INTRAVENOUS | Status: DC | PRN
  Administered 2015-06-16: 15 ug/min via INTRAVENOUS

## 2015-06-16 MED ORDER — ROCURONIUM BROMIDE 50 MG/5ML IV SOLN
INTRAVENOUS | Status: AC
  Filled 2015-06-16: qty 1

## 2015-06-16 MED ORDER — FENTANYL CITRATE (PF) 100 MCG/2ML IJ SOLN: 12.5000 ug | INTRAMUSCULAR | Status: DC | PRN

## 2015-06-16 MED ORDER — POLYETHYLENE GLYCOL 3350 17 G PO PACK: 17.0000 g | PACK | Freq: Every day | ORAL | Status: DC | PRN

## 2015-06-16 MED ORDER — THROMBIN 20000 UNITS EX SOLR
CUTANEOUS | Status: DC | PRN
  Administered 2015-06-16: 20 mL via TOPICAL

## 2015-06-16 MED ORDER — BISACODYL 10 MG RE SUPP: 10.0000 mg | Freq: Every day | RECTAL | Status: DC | PRN

## 2015-06-16 MED ORDER — PHENOL 1.4 % MT LIQD
1.0000 | OROMUCOSAL | Status: DC | PRN
Start: 2015-06-16 — End: 2015-06-22

## 2015-06-16 MED ORDER — KETOROLAC TROMETHAMINE 30 MG/ML IJ SOLN
INTRAMUSCULAR | Status: AC
  Filled 2015-06-16: qty 1

## 2015-06-16 MED ORDER — ALBUMIN HUMAN 5 % IV SOLN
INTRAVENOUS | Status: DC | PRN
  Administered 2015-06-16: 20:00:00 via INTRAVENOUS

## 2015-06-16 MED ORDER — 0.9 % SODIUM CHLORIDE (POUR BTL) OPTIME
TOPICAL | Status: DC | PRN
  Administered 2015-06-16 (×2): 1000 mL

## 2015-06-16 MED ORDER — DEXAMETHASONE SODIUM PHOSPHATE 10 MG/ML IJ SOLN
INTRAMUSCULAR | Status: DC | PRN
  Administered 2015-06-16: 10 mg via INTRAVENOUS

## 2015-06-16 MED ORDER — ONDANSETRON HCL 4 MG/2ML IJ SOLN
INTRAMUSCULAR | Status: AC
  Filled 2015-06-16: qty 2

## 2015-06-16 MED ORDER — LIDOCAINE-EPINEPHRINE 1 %-1:100000 IJ SOLN
INTRAMUSCULAR | Status: DC | PRN
  Administered 2015-06-16: 4.5 mL
  Administered 2015-06-16: 5 mL

## 2015-06-16 MED ORDER — ACETAMINOPHEN 650 MG RE SUPP: 650.0000 mg | RECTAL | Status: DC | PRN

## 2015-06-16 MED ORDER — FENTANYL CITRATE (PF) 100 MCG/2ML IJ SOLN
INTRAMUSCULAR | Status: DC | PRN
  Administered 2015-06-16: 100 ug via INTRAVENOUS
  Administered 2015-06-16 (×9): 50 ug via INTRAVENOUS

## 2015-06-16 MED ORDER — SODIUM CHLORIDE 0.9 % IV SOLN
INTRAVENOUS | Status: DC | PRN
  Administered 2015-06-16: 21:00:00 via INTRAVENOUS

## 2015-06-16 MED ORDER — SUCCINYLCHOLINE CHLORIDE 20 MG/ML IJ SOLN
INTRAMUSCULAR | Status: DC | PRN
  Administered 2015-06-16: 120 mg via INTRAVENOUS

## 2015-06-16 MED ORDER — EPHEDRINE SULFATE 50 MG/ML IJ SOLN
INTRAMUSCULAR | Status: DC | PRN
  Administered 2015-06-16: 10 mg via INTRAVENOUS
  Administered 2015-06-16: 5 mg via INTRAVENOUS
  Administered 2015-06-16: 10 mg via INTRAVENOUS
  Administered 2015-06-16: 5 mg via INTRAVENOUS

## 2015-06-16 MED ORDER — THROMBIN 5000 UNITS EX SOLR
CUTANEOUS | Status: DC | PRN
  Administered 2015-06-16 (×2): 5 mL via TOPICAL

## 2015-06-16 MED ORDER — FENTANYL CITRATE (PF) 100 MCG/2ML IJ SOLN
12.5000 ug | INTRAMUSCULAR | Status: DC | PRN
  Administered 2015-06-16 – 2015-06-17 (×2): 12.5 ug via INTRAVENOUS
  Filled 2015-06-16 (×2): qty 2

## 2015-06-16 MED ORDER — SODIUM CHLORIDE 0.9% FLUSH
3.0000 mL | Freq: Two times a day (BID) | INTRAVENOUS | Status: DC
  Administered 2015-06-16 – 2015-06-22 (×10): 3 mL via INTRAVENOUS

## 2015-06-16 MED ORDER — DEXAMETHASONE SODIUM PHOSPHATE 10 MG/ML IJ SOLN
INTRAMUSCULAR | Status: AC
  Filled 2015-06-16: qty 1

## 2015-06-16 MED ORDER — SODIUM CHLORIDE 0.9 % IV SOLN
INTRAVENOUS | Status: DC
  Administered 2015-06-16 – 2015-06-18 (×4): via INTRAVENOUS

## 2015-06-16 MED ORDER — SENNA 8.6 MG PO TABS
1.0000 | ORAL_TABLET | Freq: Two times a day (BID) | ORAL | Status: DC
Start: 2015-06-16 — End: 2015-06-16

## 2015-06-16 MED ORDER — ROCURONIUM BROMIDE 50 MG/5ML IV SOLN
INTRAVENOUS | Status: AC
  Filled 2015-06-16: qty 2

## 2015-06-16 MED ORDER — FENTANYL CITRATE (PF) 100 MCG/2ML IJ SOLN
50.0000 ug | INTRAMUSCULAR | Status: DC | PRN
  Administered 2015-06-16: 25 ug via INTRAVENOUS

## 2015-06-16 MED ORDER — FENTANYL CITRATE (PF) 100 MCG/2ML IJ SOLN
INTRAMUSCULAR | Status: AC
  Filled 2015-06-16: qty 2

## 2015-06-16 MED ORDER — HYDRALAZINE HCL 20 MG/ML IJ SOLN: 10.0000 mg | INTRAMUSCULAR | Status: DC | PRN

## 2015-06-16 MED ORDER — CEFAZOLIN SODIUM 1-5 GM-% IV SOLN
1.0000 g | Freq: Three times a day (TID) | INTRAVENOUS | Status: AC
  Administered 2015-06-17 (×2): 1 g via INTRAVENOUS
  Filled 2015-06-16 (×2): qty 50

## 2015-06-16 MED ORDER — ALUM & MAG HYDROXIDE-SIMETH 200-200-20 MG/5ML PO SUSP
30.0000 mL | Freq: Four times a day (QID) | ORAL | Status: DC | PRN
Start: 2015-06-16 — End: 2015-06-16

## 2015-06-16 MED ORDER — SODIUM CHLORIDE 0.9% FLUSH: 3.0000 mL | INTRAVENOUS | Status: DC | PRN

## 2015-06-16 MED ORDER — DOCUSATE SODIUM 100 MG PO CAPS: 100.0000 mg | ORAL_CAPSULE | Freq: Two times a day (BID) | ORAL | Status: DC

## 2015-06-16 MED ORDER — ONDANSETRON HCL 4 MG/2ML IJ SOLN
4.0000 mg | INTRAMUSCULAR | Status: DC | PRN
  Filled 2015-06-16: qty 2

## 2015-06-16 MED ORDER — ONDANSETRON HCL 4 MG/2ML IJ SOLN
INTRAMUSCULAR | Status: DC | PRN
  Administered 2015-06-16: 4 mg via INTRAVENOUS

## 2015-06-16 MED ORDER — CEFAZOLIN SODIUM-DEXTROSE 2-3 GM-% IV SOLR
INTRAVENOUS | Status: AC
  Filled 2015-06-16: qty 50

## 2015-06-16 MED ORDER — PROPOFOL 10 MG/ML IV BOLUS
INTRAVENOUS | Status: DC | PRN
  Administered 2015-06-16: 150 mg via INTRAVENOUS
  Administered 2015-06-16: 30 mg via INTRAVENOUS
  Administered 2015-06-16: 20 mg via INTRAVENOUS

## 2015-06-16 MED ORDER — SUGAMMADEX SODIUM 200 MG/2ML IV SOLN
INTRAVENOUS | Status: DC | PRN
  Administered 2015-06-16: 200 mg via INTRAVENOUS

## 2015-06-16 SURGICAL SUPPLY — 94 items
ADH SKN CLS APL DERMABOND .7 (GAUZE/BANDAGES/DRESSINGS) ×4
BAG DECANTER FOR FLEXI CONT (MISCELLANEOUS) ×3 IMPLANT
BLADE CLIPPER SURG (BLADE) IMPLANT
BONE CANC CHIPS 20CC PCAN1/4 (Bone Implant) ×3 IMPLANT
BONE MATRIX OSTEOCEL PRO MED (Bone Implant) ×9 IMPLANT
BUR MATCHSTICK NEURO 3.0 LAGG (BURR) ×3 IMPLANT
BUR ROUND FLUTED 5 RND (BURR) ×3 IMPLANT
CAGE COROENT MP 8X23 (Cage) ×3 IMPLANT
CANISTER SUCT 3000ML PPV (MISCELLANEOUS) ×3 IMPLANT
CHIPS CANC BONE 20CC PCAN1/4 (Bone Implant) ×2 IMPLANT
CONNECTOR RELINE 20MM OPEN OFF (Connector) ×6 IMPLANT
CONT SPEC 4OZ CLIKSEAL STRL BL (MISCELLANEOUS) ×3 IMPLANT
COVER BACK TABLE 24X17X13 BIG (DRAPES) IMPLANT
COVER BACK TABLE 60X90IN (DRAPES) ×3 IMPLANT
DECANTER SPIKE VIAL GLASS SM (MISCELLANEOUS) ×3 IMPLANT
DERMABOND ADVANCED (GAUZE/BANDAGES/DRESSINGS) ×2
DERMABOND ADVANCED .7 DNX12 (GAUZE/BANDAGES/DRESSINGS) ×4 IMPLANT
DEVICE DISSECT PLASMABLAD 3.0S (MISCELLANEOUS) ×2 IMPLANT
DIGITIZER BENDINI (MISCELLANEOUS) ×3 IMPLANT
DRAPE C-ARM 42X72 X-RAY (DRAPES) ×9 IMPLANT
DRAPE C-ARMOR (DRAPES) ×3 IMPLANT
DRAPE LAPAROTOMY 100X72X124 (DRAPES) ×6 IMPLANT
DRAPE POUCH INSTRU U-SHP 10X18 (DRAPES) ×6 IMPLANT
DRAPE PROXIMA HALF (DRAPES) ×3 IMPLANT
DRSG OPSITE POSTOP 4X10 (GAUZE/BANDAGES/DRESSINGS) ×3 IMPLANT
DRSG OPSITE POSTOP 4X6 (GAUZE/BANDAGES/DRESSINGS) ×3 IMPLANT
DURAPREP 26ML APPLICATOR (WOUND CARE) ×6 IMPLANT
ELECT REM PT RETURN 9FT ADLT (ELECTROSURGICAL) ×6
ELECTRODE REM PT RTRN 9FT ADLT (ELECTROSURGICAL) ×4 IMPLANT
EVACUATOR 3/16  PVC DRAIN (DRAIN) ×1
EVACUATOR 3/16 PVC DRAIN (DRAIN) ×2 IMPLANT
GAUZE SPONGE 4X4 16PLY XRAY LF (GAUZE/BANDAGES/DRESSINGS) ×3 IMPLANT
GLOVE BIOGEL PI IND STRL 7.0 (GLOVE) ×6 IMPLANT
GLOVE BIOGEL PI IND STRL 7.5 (GLOVE) ×4 IMPLANT
GLOVE BIOGEL PI IND STRL 8 (GLOVE) ×6 IMPLANT
GLOVE BIOGEL PI IND STRL 8.5 (GLOVE) ×4 IMPLANT
GLOVE BIOGEL PI INDICATOR 7.0 (GLOVE) ×3
GLOVE BIOGEL PI INDICATOR 7.5 (GLOVE) ×2
GLOVE BIOGEL PI INDICATOR 8 (GLOVE) ×3
GLOVE BIOGEL PI INDICATOR 8.5 (GLOVE) ×2
GLOVE ECLIPSE 7.5 STRL STRAW (GLOVE) ×12 IMPLANT
GLOVE ECLIPSE 8.5 STRL (GLOVE) ×12 IMPLANT
GLOVE ECLIPSE 9.0 STRL (GLOVE) ×3 IMPLANT
GLOVE EXAM NITRILE LRG STRL (GLOVE) IMPLANT
GLOVE EXAM NITRILE MD LF STRL (GLOVE) IMPLANT
GLOVE EXAM NITRILE XL STR (GLOVE) IMPLANT
GLOVE EXAM NITRILE XS STR PU (GLOVE) IMPLANT
GLOVE SURG SS PI 6.5 STRL IVOR (GLOVE) ×9 IMPLANT
GLOVE SURG SS PI 7.0 STRL IVOR (GLOVE) ×9 IMPLANT
GOWN STRL REUS W/ TWL LRG LVL3 (GOWN DISPOSABLE) IMPLANT
GOWN STRL REUS W/ TWL XL LVL3 (GOWN DISPOSABLE) ×6 IMPLANT
GOWN STRL REUS W/TWL 2XL LVL3 (GOWN DISPOSABLE) ×15 IMPLANT
GOWN STRL REUS W/TWL LRG LVL3 (GOWN DISPOSABLE)
GOWN STRL REUS W/TWL XL LVL3 (GOWN DISPOSABLE) ×9
HEMOSTAT POWDER KIT SURGIFOAM (HEMOSTASIS) ×6 IMPLANT
IMPL COROENT XL 8X18X50 (Intraocular Lens) ×4 IMPLANT
IMPL COROENT XL 8X8X45 (Intraocular Lens) ×2 IMPLANT
IMPLANT COROENT XL 8X18X50 (Intraocular Lens) ×6 IMPLANT
IMPLANT COROENT XL 8X8X45 (Intraocular Lens) ×3 IMPLANT
KIT BASIN OR (CUSTOM PROCEDURE TRAY) ×3 IMPLANT
KIT DILATOR XLIF 5 (KITS) ×2 IMPLANT
KIT INFUSE LRG II (Orthopedic Implant) ×3 IMPLANT
KIT ROOM TURNOVER OR (KITS) ×3 IMPLANT
KIT SURGICAL ACCESS MAXCESS 4 (KITS) ×3 IMPLANT
KIT XLIF (KITS) ×1
MILL MEDIUM DISP (BLADE) ×3 IMPLANT
MODULE NVM5 NEXT GEN EMG (NEEDLE) ×3 IMPLANT
MODULE POWER NUVASIVE (MISCELLANEOUS) ×2 IMPLANT
NEEDLE HYPO 25X1 1.5 SAFETY (NEEDLE) ×3 IMPLANT
NS IRRIG 1000ML POUR BTL (IV SOLUTION) ×3 IMPLANT
PACK LAMINECTOMY NEURO (CUSTOM PROCEDURE TRAY) ×6 IMPLANT
PAD ARMBOARD 7.5X6 YLW CONV (MISCELLANEOUS) ×24 IMPLANT
PAD SHARPS MAGNETIC DISPOSAL (MISCELLANEOUS) ×3 IMPLANT
PLASMABLADE 3.0S (MISCELLANEOUS) ×3
POWER MODULE NUVASIVE (MISCELLANEOUS) ×3
ROD RELINE-O 5.5X300 STRT NS (Rod) ×4 IMPLANT
ROD RELINE-O 5.5X300MM STRT (Rod) ×6 IMPLANT
SCREW LOCK RELINE CLOSED TULIP (Screw) ×48 IMPLANT
SCREW RELINE-O 7.5X80MM CLSD (Screw) ×6 IMPLANT
SCREW RELINE-O POLY 5.5X45MM (Screw) ×6 IMPLANT
SCREW RELINE-O POLY 6.5X40 (Screw) ×6 IMPLANT
SCREW RELINE-O POLY 6.5X45 (Screw) ×30 IMPLANT
SPONGE LAP 4X18 X RAY DECT (DISPOSABLE) IMPLANT
SPONGE SURGIFOAM ABS GEL 100 (HEMOSTASIS) ×3 IMPLANT
SUT VIC AB 1 CT1 18XBRD ANBCTR (SUTURE) ×8 IMPLANT
SUT VIC AB 1 CT1 8-18 (SUTURE) ×12
SUT VIC AB 2-0 CP2 18 (SUTURE) ×12 IMPLANT
SUT VIC AB 3-0 SH 8-18 (SUTURE) ×12 IMPLANT
TAPE CLOTH 3X10 TAN LF (GAUZE/BANDAGES/DRESSINGS) ×9 IMPLANT
TOWEL OR 17X24 6PK STRL BLUE (TOWEL DISPOSABLE) ×6 IMPLANT
TOWEL OR 17X26 10 PK STRL BLUE (TOWEL DISPOSABLE) ×12 IMPLANT
TRAP SPECIMEN MUCOUS 40CC (MISCELLANEOUS) ×3 IMPLANT
TRAY FOLEY W/METER SILVER 14FR (SET/KITS/TRAYS/PACK) ×3 IMPLANT
WATER STERILE IRR 1000ML POUR (IV SOLUTION) ×3 IMPLANT

## 2015-06-16 NOTE — Consult Note (Signed)
Name: Rita Gregory MRN: [id number] DOB: 1946-01-21    ADMISSION DATE:  06/12/2015 CONSULTATION DATE:  06/16/15  REFERRING MD :  Ellene Route  CHIEF COMPLAINT:  Post op management   HISTORY OF PRESENT ILLNESS: Pt is somnolent post anesthesia; therefore, this HPI is obtained from chart review.   Rita Gregory is a 70 y.o. female with a PMH as outlined below including progressive degenerative scoliosis for which she has been followed by Dr. Ellene Route for several years now.  She had been having progressive decline and deterioration in her level of function and in the past week or 2, had intractable pain.  Of note, she has multiple allergies to various narcotics making pain control difficult.  She had epidural steroid injection 06/12/15 without much relief.  After much discussion on various options, decision was made to take her to OR on 06/16/15 for decompression and fusion of L12, L2-3, L3-4, L4-5 right, posterior lateral arthrodesis from T12 to sacrum.  She was extubated post operatively and PCCM was asked to assist with management.  PAST MEDICAL HISTORY :   has a past medical history of Diverticulosis of colon (without mention of hemorrhage) (11/09/2007); Colon polyp (11/09/2007); Esophageal reflux; Fibromyalgia; Hypertension; Kyphoscoliosis; Chronic pain; Patent foramen ovale; Migraine; and Paroxysmal spells ([place]) (02/13/2013).  has past surgical history that includes Appendectomy; Cholecystectomy; Abdominal hysterectomy; Joint replacement; Knee arthroscopy w/ ACL reconstruction; dry eyes; Cesarean section; right rotator cuff (2011); Esophagogastroduodenoscopy (02/23/2012); and Radiology with anesthesia (N/A, 06/15/2015). Prior to Admission medications   Medication Sig Start Date End Date Taking? Authorizing Provider  acetaminophen (TYLENOL) 650 MG CR tablet Take 650 mg by mouth every 8 (eight) hours as needed for pain.   Yes Historical Provider, MD  albuterol (PROVENTIL HFA;VENTOLIN HFA) 108  (90 BASE) MCG/ACT inhaler Inhale 2 puffs into the lungs every 6 (six) hours as needed. For shortness of breath   Yes Historical Provider, MD  Choline Fenofibrate (TRILIPIX) 135 MG capsule Take 135 mg by mouth at bedtime.   Yes Historical Provider, MD  cycloSPORINE (RESTASIS) 0.05 % ophthalmic emulsion Place 1 drop into both eyes 2 (two) times daily.   Yes Historical Provider, MD  EPINEPHrine (EPIPEN 2-PAK) 0.3 mg/0.3 mL DEVI Inject 0.3 mg into the muscle once. To medications, touch, or inhaltions   Yes Historical Provider, MD  escitalopram (LEXAPRO) 10 MG tablet Take 10 mg by mouth 2 (two) times daily.   Yes Historical Provider, MD  estrogens, conjugated, (PREMARIN) 0.3 MG tablet Take 0.3 mg by mouth daily.   Yes Historical Provider, MD  gabapentin (NEURONTIN) 100 MG capsule Take 1 capsule (100 mg total) by mouth 2 (two) times daily. 02/13/13  Yes Carmen Dohmeier, MD  HYDROcodone-acetaminophen (NORCO/VICODIN) 5-325 MG tablet Take 1-2 tablets by mouth every 6 (six) hours as needed. pain 06/08/15  Yes Historical Provider, MD  ketoconazole (NIZORAL) 2 % cream Apply 1 application topically 2 (two) times daily as needed for irritation.    Yes Historical Provider, MD  L-Methylfolate-Algae (DEPLIN 15) 15-90.314 MG CAPS Take 1 capsule by mouth daily. 05/19/15  Yes Historical Provider, MD  lansoprazole (PREVACID) 30 MG capsule Take 30 mg by mouth 2 (two) times daily before a meal.   Yes Historical Provider, MD  levothyroxine (SYNTHROID, LEVOTHROID) 25 MCG tablet Take 25 mcg by mouth at bedtime.   Yes Historical Provider, MD  lisinopril (PRINIVIL,ZESTRIL) 2.5 MG tablet Take 1.25 mg by mouth every morning.    Yes Historical Provider, MD  methylphenidate [facility]) 10 mg/9hr Place 1  patch onto the skin daily as needed (15mg  daily/10 mg as a prn). Take if extra if needed for ADHD. Wear patch for 9 hours only each day   Yes Historical Provider, MD  methylphenidate [facility]) 15 mg/9hr Place 1 patch onto the skin daily.  wear patch for 9 hours only each day   Yes Historical Provider, MD  pravastatin (PRAVACHOL) 40 MG tablet Take 40 mg by mouth at bedtime.   Yes Historical Provider, MD  pyridOXINE (VITAMIN B-6) 100 MG tablet Take 100 mg by mouth daily.    Yes Historical Provider, MD  ranitidine (ZANTAC) 150 MG capsule Take 150 mg by mouth 2 (two) times daily as needed. For GERD   Yes Historical Provider, MD  testosterone (ANDROGEL) 50 MG/5GM GEL Place 5 g onto the skin See admin instructions. Three times weekly as needed for hormone replacement   Yes Historical Provider, MD  traZODone (DESYREL) 50 MG tablet Take 50 mg by mouth at bedtime.    Yes Historical Provider, MD   Allergies  Allergen Reactions  . Fentanyl Anaphylaxis  . Morphine And Related Anaphylaxis    respiratory depression   . Bupropion Hcl     Dizzy, fog  . Codeine Nausea And Vomiting  . Demerol [Meperidine] Nausea And Vomiting  . Dextromethorphan Other (See Comments)    Unknown   . Guaifenesin & Derivatives     Other, unknown  . Lipitor [Atorvastatin]   . Lovaza [Omega-3-Acid Ethyl Esters] Diarrhea  . Lyrica [Pregabalin]     Fatigue and depression  . Naltrexone     depression  . Percodan [Oxycodone-Aspirin] Nausea And Vomiting  . Prilosec [Omeprazole]     Iliacs    . Topamax [Topiramate]     disorientation   . Valium [Diazepam]     Small doses are OK but large doses are not. Long-term use causes depression.   . Versed [Midazolam]     In moderate in large doses shortness of breath, and respiratory depression   . Adhesive [Tape] Rash    Skin irritation   . Concerta [Methylphenidate] Palpitations  . Lamotrigine Rash  . Nickel Rash    Skin irritation   . Oxycodone-Acetaminophen Nausea And Vomiting and Rash    depression  . Percocet [Oxycodone-Acetaminophen] Nausea And Vomiting and Rash  . Tegaderm Ag Mesh [Silver] Rash    FAMILY HISTORY:  family history includes Bipolar disorder in her sister and son; Colon cancer in her  father; Diabetes type II in her father; Heart disease in her father; Kidney disease in her father; Ovarian cancer in her mother; Stomach cancer in her paternal uncle. SOCIAL HISTORY:  reports that she has been passively smoking.  She does not have any smokeless tobacco history on file. She reports that she drinks about 0.5 - 1.5 oz of alcohol per week. She reports that she does not use illicit drugs.  REVIEW OF SYSTEMS:  Unable to obtain as pt is somnolent post anesthesia.   SUBJECTIVE:  Pain is controlled.  VITAL SIGNS: Temp:  [97.6 F (36.4 C)-98.3 F (36.8 C)] 97.6 F (36.4 C) (03/07 2255) Pulse Rate:  [61-112] 94 (03/07 2255) Resp:  [7-18] 7 (03/07 2255) BP: (93-147)/(63-83) 106/81 mmHg (03/07 2255) SpO2:  [95 %-98 %] 98 % (03/07 2255) Weight:  [72.4 kg (159 lb 9.8 oz)] 72.4 kg (159 lb 9.8 oz) (03/07 2255)  PHYSICAL EXAMINATION: General: Adult female, resting in bed, in NAD. Neuro: Somnolent.  Opens eyes to voice, feels pain is controlled. HEENT: [place]/AT.  PERRL, sclerae anicteric. Cardiovascular: RRR, no M/R/G.  Lungs: Respirations even and unlabored.  CTA bilaterally, No W/R/R.  Abdomen: BS x 4, soft, NT/ND.  Musculoskeletal: No gross deformities, no edema.  Skin: Intact, warm, no rashes.     Recent Labs Lab 06/12/15 2239  CREATININE 0.55    Recent Labs Lab 06/12/15 2239  HGB 13.9  HCT 43.0  WBC 8.0  PLT 312   Dg Lumbar Spine 2-3 Views  06/16/2015  CLINICAL DATA:  T12-S1 lumbar fusion EXAM: DG C-ARM GT 120 MIN; LUMBAR SPINE - 2-3 VIEW FLUOROSCOPY TIME:  Fluoroscopy Time (in minutes and seconds): 1 minutes 15 seconds COMPARISON:  Lumbar spine CT dated 06/16/2015 FINDINGS: Intraoperative fluoroscopic images during T12-S1 posterior lumbar fusion. Two frontal and three lateral images were obtained, demonstrating surgical hardware in satisfactory position. IMPRESSION: Intraoperative fluoroscopic images, as above. Electronically Signed   By: Julian Hy M.D.   On:  06/16/2015 21:32   Dg Pelvis 1-2 Views  06/15/2015  CLINICAL DATA:  Preoperative examination.  Right hip pain. EXAM: PELVIS - 1-2 VIEW COMPARISON:  None. FINDINGS: There is no evidence of pelvic fracture or diastasis. No pelvic bone lesions are seen. Visualized hips appear intact. IMPRESSION: Negative. Electronically Signed   By: Lucienne Capers M.D.   On: 06/15/2015 21:21   Ct Lumbar Spine Wo Contrast  06/16/2015  CLINICAL DATA:  Initial evaluation for right hip pain. Patient scheduled for spinal surgery tomorrow. EXAM: CT LUMBAR SPINE WITHOUT CONTRAST TECHNIQUE: Multidetector CT imaging of the lumbar spine was performed without intravenous contrast administration. Multiplanar CT image reconstructions were also generated. COMPARISON:  Prior MRI of the lumbar spine performed on 06/15/2015. FINDINGS: Severe dextroscoliosis of the lumbar spine with apex at L3 again seen. Vertebral body heights are maintained without evidence of fracture. Trace retrolisthesis of L2 on L3. No other significant listhesis. Paraspinous soft tissues demonstrate no acute abnormality. Mild fatty atrophy within the posterior paraspinous musculature. Visualized visceral structures within normal limits. 13 mm cyst present within the lower pole left kidney. Cholecystectomy clips noted. Mild scattered atheromatous plaque within the infrarenal aorta. No aneurysm. No retroperitoneal adenopathy. T12-L1:  Mild disc bulge without significant stenosis. L1-2: Degenerative intervertebral disc space narrowing with disc desiccation. Reactive endplate sclerosis with prominent endplate anterior osteophytic spurring, predominantly at the right anterolateral aspect of the L2-3 disc space. Mild bilateral facet arthrosis, slightly worse on the right. Associated right lateral recess stenosis at this level better evaluated on previous MRI. L2-3: Degenerative intervertebral disc space narrowing. Reactive endplate changes, predominantly at the left aspect of the  L2-3 disc space. Bulky endplate osteophytic spurring from the superior endplate of L3, and to a lesser extent the inferior endplate of L2. Disc bulging, asymmetric to the left, better evaluated on prior MRI. Bilateral facet arthrosis, slightly worse on the left. Associated left lateral recess stenosis at this level better evaluated on prior MRI. L3-4: Degenerative intervertebral disc space narrowing with disc desiccation. Reactive endplate changes with endplate osteophytic spurring, predominantly at the left aspect of the L3-4 disc space. Disc bulging, asymmetric to the left, better evaluated on MRI. Left greater than right facet disease. Gas within the epidural space at this level likely degenerative in nature. Associated left lateral recess stenosis at this level better evaluated on prior MRI. L4-5: Degenerative disc bulge with disc desiccation. Moderate to severe right-sided facet disease. More moderate left-sided facet arthrosis. Broad-based disc protrusion better evaluated on prior MRI. Mild to moderate right lateral recess stenosis better seen on previous  MRI. Moderate right foraminal narrowing related to bony osteophytic spurring from the right L4-5 S at. L5-S1: Degenerative intervertebral disc space narrowing with reactive endplate changes. Prominent endplate osteophytic spurring at the right aspect of the L5-S1 disc space. Severe facet arthrosis on the right. Resultant moderate right foraminal narrowing P no significant canal stenosis. IMPRESSION: 1. Severe dextroscoliosis with apex at L3. 2. Multilevel degenerative intervertebral disc space narrowing, disc desiccation and reactive endplate sclerosis and osteophytosis, with facet disease as above. There is right-sided lateral recess stenosis at L1-2, L4-5, and L5-S1, with left lateral recess stenosis at L2-3 and L3-4. Moderate right foraminal stenosis at L4-5 and L5-S1. These stenoses are somewhat better appreciated on recent MRI from 06/15/2015. This study  will be used for surgical planning purposes. Electronically Signed   By: Jeannine Boga M.D.   On: 06/16/2015 03:27   Mr Lumbar Spine Wo Contrast  06/15/2015  CLINICAL DATA:  Severe intractable back pain with right lumbar radiculopathy. Scoliosis. EXAM: MRI LUMBAR SPINE WITHOUT CONTRAST TECHNIQUE: Multiplanar, multisequence MR imaging of the lumbar spine was performed. No intravenous contrast was administered. COMPARISON:  Lumbar spine radiographs 11/12/2014. FINDINGS: Normal signal is present in the conus medullaris which terminates at T12-L1. Severe rightward curvature of the lumbar spine is centered at L2-3. There is asymmetric left-sided endplate marrow change at L2-3 and right-sided endplate marrow change at L1-2. Vertebral body heights maintained. There is slight retrolisthesis at L2-3. AP alignment is otherwise anatomic. Limited imaging of the abdomen demonstrates a benign cyst in the right kidney posteriorly. No other focal lesions are evident. T12-L1:  Negative. L1-2: A broad-based disc protrusion is present. There is mild facet hypertrophy bilaterally. Mild foraminal narrowing is present on the right. L2-3: A broad-based disc protrusion is asymmetric to the left. This results in moderate left subarticular stenosis. Asymmetric left-sided facet hypertrophy contributes. Moderate to severe left foraminal narrowing is present. There is mild right foraminal stenosis. L3-4: A broad-based disc protrusion is asymmetric to the left. Mild to moderate left subarticular narrowing is present. Moderate left and mild right foraminal narrowing is present as well. L4-5: Moderate facet hypertrophy is present bilaterally. There is a broad-based disc protrusion. This results in mild to moderate subarticular and foraminal narrowing bilaterally, right greater than left. L5-S1: Asymmetric moderate facet hypertrophy is present. The central canal is patent. Moderate right foraminal stenosis is present. IMPRESSION: 1. Severe  dextro convex scoliosis is centered at L2-3. 2. Mild foraminal narrowing on the right at L1-2. 3. Moderate left subarticular and moderate to severe left foraminal narrowing at L2-3. 4. Mild to moderate left subarticular stenosis at L3-4. 5. Moderate left and mild right foraminal narrowing at L3-4. 6. Mild to moderate subarticular and foraminal narrowing bilaterally at L4-5 is worse on the right. 7. Moderate right foraminal stenosis at L5-S1. Electronically Signed   By: San Morelle M.D.   On: 06/15/2015 15:32   Dg C-arm Gt 120 Min  06/16/2015  CLINICAL DATA:  T12-S1 lumbar fusion EXAM: DG C-ARM GT 120 MIN; LUMBAR SPINE - 2-3 VIEW FLUOROSCOPY TIME:  Fluoroscopy Time (in minutes and seconds): 1 minutes 15 seconds COMPARISON:  Lumbar spine CT dated 06/16/2015 FINDINGS: Intraoperative fluoroscopic images during T12-S1 posterior lumbar fusion. Two frontal and three lateral images were obtained, demonstrating surgical hardware in satisfactory position. IMPRESSION: Intraoperative fluoroscopic images, as above. Electronically Signed   By: Julian Hy M.D.   On: 06/16/2015 21:32    STUDIES:  Lumbar MRI 03/06 > severe dextro convex scoliosis L2-3.  Mild foraminal narrowing on the right at L1-2.  Mod-severe left foraminal narrowing at L2-3.  Mild - mod left subarticular stenosis at L3-4.  Mod left and mild right foraminal narrowing at L3-4.  Mild - mod subarticular and foraminal narrowing bilaterally L4-5 worse on right.  Mod right foraminal stenosis at L5-S1.  SIGNIFICANT EVENTS  03/03 > admitted with intractable pain. 03/07 > to OR for decompression and fusion of L12, L2-3, L3-4, L4-5 right, posterior lateral arthrodesis from T12 to sacrum.   ASSESSMENT / PLAN:  Progressive degenerative scoliosis with intractable pain - s/p decompression and fusion of L12, L2-3, L3-4, L4-5 right, posterior lateral arthrodesis from T12 to sacrum 06/16/15. Fibromyalgia. Plan:  Post op care and pain control per  neurosurgery.  Esophageal reflux. Plan: Continue protonix, ranitidine.  Hypertension. Plan: Monitor hemodynamics. Hydralazine PRN.   Montey Hora, [facility] Pager: [phone]  or [phone] 06/16/2015, 11:25 PM

## 2015-06-16 NOTE — Care Management Note (Signed)
Case Management Note  Patient Details  Name: Rita Gregory MRN: [id number] Date of Birth: 12/20/1945  Subjective/Objective:                    Action/Plan: Patient presented with Intractable back pain right hip and leg pain.  Lives at home with spouse.  Patient completed an MRI under general anesthesia on 06/15/15.  Plan is for surgery today, 06/26/15.  Will follow for discharge needs pending post-op PT/OT evals and physician orders.  Expected Discharge Date:                  Expected Discharge Plan:     In-House Referral:     Discharge planning Services     Post Acute Care Choice:    Choice offered to:     DME Arranged:    DME Agency:     HH Arranged:    HH Agency:     Status of Service:  In process, will continue to follow  Medicare Important Message Given:    Date Medicare IM Given:    Medicare IM give by:    Date Additional Medicare IM Given:    Additional Medicare Important Message give by:     If discussed at [place] of Stay Meetings, dates discussed:    Additional Comments:  Rolm Baptise, RN 06/16/2015, 10:58 AM [phone]

## 2015-06-16 NOTE — Op Note (Signed)
Date of surgery: 06/16/2014 Preoperative diagnosis: Idiopathic scoliosis with degenerative features, right lumbar radiculopathy, intractable Postoperative diagnosis: Idiopathic scoliosis with degenerative features, right lumbar radiculopathy, intractable Procedure: Stage I: Anterolateral decompression and fusion with anterior interbody technique L12 L2-3 and L3-4, X lift spacer. Stage II posterior decompression of right sided L4 and L5 nerve roots with transforaminal interbody fusion L4-5 right. Segmental fixation from T12 to the ilium with pedicle screws and rods, posterior lateral arthrodesis from T12 to the sacrum with local autograft and allograft, infuse Surgeon: Kristeen Miss First assistant: Deri Fuelling M.D. Anesthesia: Gen. endotracheal Indications: Rita Gregory is a 70 year old individual who's had idiopathic scoliosis over the years she's developed degenerative features an increase in the curve to 40. Curve goes between L2 and L4. She has good sagittal balance her coronal balance is adequate also however she is now having severe right lumbar radicular pain. She's been advised regarding surgery.  Procedure: Patient was brought to the operating room supine on a stretcher. After the smooth induction of general endotracheal anesthesia and placement of EMG monitoring electrodes in the dermatomes from L1-S1 she was turned into the right lateral decubitus position. She is placed over the table break and with stable positioning the lateral aspects of the vertebrae were aligned to allow for fluoroscopic visualization of the interspaces at L1 to L2-3 L3-4 and T12-L1. The procedure was started after prepping and draping the patient in lateral position. This started L3-4. Initial attempts to place a probe and lateral body of L3 for more met with significant EMG activity. His then decided to start at L2-3 and here as of lateral vertebral body could be entered without difficulty. The retractor was placed  over the interspace and the shim was placed at the L2-3 space posteriorly. The psoas muscle was carefully resected from the lateral aspect of the vertebral body and then the disc space was entered using a 15 blade. A series of curettes and rongeurs were then passed through the disc space to remove a significant quantity of severely degenerated disc material then a series of respiratory is reviews to remove the endplate material. Then a series of sizers were used to size and interbody spacer which distracted the interspace. His found then 8 mm tall 18 mm anterior posterior by 50 mm spacer would fit best. This was filled with osseous cell and placed into the interspace. Attention was then turned to L3-4 again where this time using a different trajectory were able to enter and neck disc space. The L3 nerve root was seen running he has a posterior of placement. Here an 18 mm x 8 mm tall 50 mm spacer was placed into the interspace after cleaning the disc space out completely. At L1-2 similar process was achieved with placement of a 45 mm spacer across the L1-L2 disc space. We then attempted to place a spacer at the T12-L1 interspace however the ribs were significantly in the way and this made achieving a lateral access to this interspace very difficult. Is felt that we would be best to limit the fixation and fusion to the posterior aspect of the interspace at the T12-L1 level. With this the probes were removed the wounds were closed with 20 and 3-0 Vicryl and Dermabond was applied to the skin. Patient was then turned prone and the second part of the procedure was started. Allograft the back was then prepped with alcohol and DuraPrep and draped in a sterile fashion. A midline incision was created and carried down to the lumbar  dorsal fascia over the lower lumbar spine. Dissection was carried out to expose the spinous processes of the sacrum L5 L4 and L3. Dissection was carried out laterally to expose the transverse  processes and the sacral alar. These were decorticated and packed off for later use. On the right side hemilaminectomy of L4 was completed and this exposed the region of the facet. The inferior facet of L4 was removed in total. The lateral aspect of the dural tube was then decompressed using a 2 and 3 mm Kerrison punch. The L4 nerve root was decompressed in its pathway at the foramen. The disc space was noted be markedly degenerated it was isolated and then entered from the right side. A significant quantity of severely degenerated and desiccated disc material was removed. The endplate was prepared for fusion and a series of dilators were used to increase the size of the space to an 8 mm height with 4 of lordosis. A T lift spacer was then placed into this lateral aperture and additional bone graft was placed into the space also. Once this portion was completed the L5 nerve root was decompressed by performing hemilaminectomy of L5 and exposing the entire path of the pedicle around L5 and the L5 nerve root far into the foramen. The dissection was carried out laterally until it is felt the nerve root was well decompressed. Here the disc space was noted be severely degenerated and desiccated and though there was a slight amount of movement the disc space could not be entered at the L5-S1 space. It is felt at this point that a posterior lateral decompression and fusion would be the safest procedure to perform.  Next pedicle screws were placed starting at the sacrum placing 6.5 x 40 mm and S1 6.5 x 45 mm in L5 L4 L3 L2 and L1 each under fluoroscopic guidance. 5.5 x 45 mm screws were placed in T12. Next iliac screws were placed by decorticating these posterior superior iliac crest at its sacral prominence and removing a bit of bone in this region. 80 mm iliac screws were placed these were 7.5 mm diameter screws. Once these screws were placed SideArm connectors were placed on the iliac screws and then the bending was  used to create 2 rods to fix the back in the neutral position. The rods were then placed individually and locked in a neutral construct. Lateral gutters were then decorticated from the T12 level always down to the sacrum on the left and from T12-L4. The previously deep corticated transverse processes and alar on the left side with then packed with strips of infuse and auto and allograft. This was done from T 12 to S1 bilaterally. Hemostasis was then checked and soft tissues and the pads of the L4 and L5 nerve root were again checked to make sure that there are freeing clear. The system was torqued down in a neutral construct and final radiographs were obtained. In the wound was closed over large Hemovac drains were brought out through separate stab incisions. One Vicryl was used to close the lumbar dorsal fascia 2-0 Vicryl closed the subcutaneous tissues and 3-0 Vicryl closed subcuticular skin. Blood loss was estimated at approximately 850 mL and some 200 mL of Cell Saver blood was returned to the patient.

## 2015-06-16 NOTE — Transfer of Care (Deleted)
Immediate Anesthesia Transfer of Care Note  Patient: Rita Gregory  Procedure(s) Performed: Procedure(s) with comments: ANTERIOR LATERAL LUMBAR FUSION THORACIC TWELVE-LUMBAR FOUR (Left) - Thoracolumbar spine POSTERIOR LUMBAR FUSION LUMBAR FOUR-FIVE LUMBAR FIVE-SACRAL ONE (N/A)  Patient Location: PACU and ICU  Anesthesia Type:General  Level of Consciousness: awake, alert  and patient cooperative  Airway & Oxygen Therapy: Patient Spontanous Breathing and Patient connected to nasal cannula oxygen  Post-op Assessment: Report given to RN and Post -op Vital signs reviewed and stable  Post vital signs: Reviewed and stable  Last Vitals:  Filed Vitals:   06/16/15 0515 06/16/15 2207  BP: 147/83   Pulse: 65   Temp: 36.7 C 36.8 C  Resp: 18     Complications: No apparent anesthesia complications

## 2015-06-16 NOTE — Anesthesia Procedure Notes (Signed)
Procedure Name: Intubation Date/Time: 06/16/2015 12:16 PM Performed by: Tressia Miners LEFFEW Pre-anesthesia Checklist: Patient identified, Patient being monitored, Timeout performed, Emergency Drugs available and Suction available Patient Re-evaluated:Patient Re-evaluated prior to inductionOxygen Delivery Method: Circle System Utilized Preoxygenation: Pre-oxygenation with 100% oxygen Intubation Type: IV induction Ventilation: Mask ventilation without difficulty Laryngoscope Size: Mac and 3 Grade View: Grade I Tube type: Oral Tube size: 7.5 mm Number of attempts: 1 Airway Equipment and Method: Stylet Placement Confirmation: ETT inserted through vocal cords under direct vision,  positive ETCO2 and breath sounds checked- equal and bilateral Secured at: 20 cm Tube secured with: Tape Dental Injury: Teeth and Oropharynx as per pre-operative assessment

## 2015-06-16 NOTE — Anesthesia Postprocedure Evaluation (Signed)
Anesthesia Post Note  Patient: Rita Gregory  Procedure(s) Performed: Procedure(s) (LRB): RADIOLOGY WITH ANESTHESIA (N/A)  Patient location during evaluation: PACU Anesthesia Type: General Level of consciousness: awake and alert Pain management: pain level controlled Vital Signs Assessment: post-procedure vital signs reviewed and stable Respiratory status: spontaneous breathing, nonlabored ventilation, respiratory function stable and patient connected to nasal cannula oxygen Cardiovascular status: blood pressure returned to baseline and stable Postop Assessment: no signs of nausea or vomiting Anesthetic complications: no    Last Vitals:  Filed Vitals:   06/16/15 0103 06/16/15 0515  BP: 118/64 147/83  Pulse: 61 65  Temp: 36.7 C 36.7 C  Resp: 18 18    Last Pain:  Filed Vitals:   06/16/15 0732  PainSc: 8                  Keelen Quevedo S

## 2015-06-16 NOTE — Transfer of Care (Signed)
Immediate Anesthesia Transfer of Care Note  Patient: Rita Gregory  Procedure(s) Performed: Procedure(s) with comments: ANTERIOR LATERAL LUMBAR FUSION THORACIC TWELVE-LUMBAR FOUR (Left) - Thoracolumbar spine POSTERIOR LUMBAR FUSION LUMBAR FOUR-FIVE LUMBAR FIVE-SACRAL ONE (N/A)  Patient Location: PACU and ICU  Anesthesia Type:General  Level of Consciousness: awake, alert  and patient cooperative  Airway & Oxygen Therapy: Patient Spontanous Breathing and Patient connected to nasal cannula oxygen  Post-op Assessment: Report given to RN, Post -op Vital signs reviewed and stable and Patient moving all extremities X 4  Post vital signs: Reviewed and stable  Last Vitals:  Filed Vitals:   06/16/15 0515 06/16/15 2207  BP: 147/83   Pulse: 65   Temp: 36.7 C 36.8 C  Resp: 18     Complications: No apparent anesthesia complications

## 2015-06-16 NOTE — Anesthesia Preprocedure Evaluation (Addendum)
Anesthesia Evaluation  Patient identified by MRN, date of birth, ID band Patient awake    Reviewed: Allergy & Precautions, H&P , NPO status , Patient's Chart, lab work & pertinent test results  Airway Mallampati: II  TM Distance: >3 FB Neck ROM: full    Dental no notable dental hx. (+) Teeth Intact, Dental Advisory Given   Pulmonary asthma ,    Pulmonary exam normal breath sounds clear to auscultation       Cardiovascular Exercise Tolerance: Poor hypertension, Pt. on medications Normal cardiovascular exam Rhythm:regular Rate:Normal  Patent foramen ovale   Neuro/Psych  Headaches, Anxiety Depression scoliosis  Neuromuscular disease negative psych ROS   GI/Hepatic Neg liver ROS, GERD  Medicated and Controlled,Diverticulosis   Endo/Other  Hypothyroidism   Renal/GU negative Renal ROS  negative genitourinary   Musculoskeletal  (+) Fibromyalgia -Intractable LBP Kyphoscoliosis   Abdominal   Peds  Hematology negative hematology ROS (+)   Anesthesia Other Findings   Reproductive/Obstetrics negative OB ROS                            Anesthesia Physical Anesthesia Plan  ASA: III  Anesthesia Plan: General   Post-op Pain Management:    Induction: Intravenous  Airway Management Planned: Oral ETT  Additional Equipment:   Intra-op Plan:   Post-operative Plan: Extubation in OR  Informed Consent: I have reviewed the patients History and Physical, chart, labs and discussed the procedure including the risks, benefits and alternatives for the proposed anesthesia with the patient or authorized representative who has indicated his/her understanding and acceptance.   Dental Advisory Given  Plan Discussed with: CRNA and Surgeon  Anesthesia Plan Comments: ( A history of anaphylaxis with IV narcotics is documented in chart but from talking to the patient and her husband it sounds like the actual  event was respiratory depression.  Probably she is just sensitive to IV narcotics.  She takes oral narcotics without anaphylaxis.  It will be impossible to control her pain post op without narcotics and this was explained to her. We agreed that fentanyl would be the best choice with supplemental Toradol or IV tylenol.  We will be conservative with the IV fentanyl to avoid respiratory depression.   Leandrea Ackley MD)       Anesthesia Quick Evaluation

## 2015-06-17 ENCOUNTER — Observation Stay (HOSPITAL_COMMUNITY): Payer: 59

## 2015-06-17 ENCOUNTER — Encounter (HOSPITAL_COMMUNITY): Payer: Self-pay | Admitting: Neurological Surgery

## 2015-06-17 DIAGNOSIS — R0689 Other abnormalities of breathing: Secondary | ICD-10-CM | POA: Insufficient documentation

## 2015-06-17 DIAGNOSIS — I9581 Postprocedural hypotension: Secondary | ICD-10-CM | POA: Insufficient documentation

## 2015-06-17 DIAGNOSIS — M549 Dorsalgia, unspecified: Secondary | ICD-10-CM

## 2015-06-17 DIAGNOSIS — D72829 Elevated white blood cell count, unspecified: Secondary | ICD-10-CM | POA: Insufficient documentation

## 2015-06-17 DIAGNOSIS — J9811 Atelectasis: Secondary | ICD-10-CM | POA: Diagnosis not present

## 2015-06-17 DIAGNOSIS — M4306 Spondylolysis, lumbar region: Secondary | ICD-10-CM

## 2015-06-17 DIAGNOSIS — M797 Fibromyalgia: Secondary | ICD-10-CM | POA: Insufficient documentation

## 2015-06-17 DIAGNOSIS — G894 Chronic pain syndrome: Secondary | ICD-10-CM

## 2015-06-17 DIAGNOSIS — D62 Acute posthemorrhagic anemia: Secondary | ICD-10-CM | POA: Insufficient documentation

## 2015-06-17 DIAGNOSIS — Z981 Arthrodesis status: Secondary | ICD-10-CM | POA: Insufficient documentation

## 2015-06-17 DIAGNOSIS — G8918 Other acute postprocedural pain: Secondary | ICD-10-CM | POA: Insufficient documentation

## 2015-06-17 DIAGNOSIS — I10 Essential (primary) hypertension: Secondary | ICD-10-CM | POA: Insufficient documentation

## 2015-06-17 DIAGNOSIS — R5383 Other fatigue: Secondary | ICD-10-CM | POA: Insufficient documentation

## 2015-06-17 LAB — BASIC METABOLIC PANEL
Anion gap: 11 (ref 5–15)
BUN: 18 mg/dL (ref 6–20)
CO2: 21 mmol/L — ABNORMAL LOW (ref 22–32)
Calcium: 7.7 mg/dL — ABNORMAL LOW (ref 8.9–10.3)
Chloride: 105 mmol/L (ref 101–111)
Creatinine, Ser: 0.93 mg/dL (ref 0.44–1.00)
GFR calc Af Amer: >60 mL/min (ref >60)
GFR calc non Af Amer: >60 mL/min (ref >60)
Glucose, Bld: 214 mg/dL — ABNORMAL HIGH (ref 65–99)
Potassium: 4.3 mmol/L (ref 3.5–5.1)
Sodium: 137 mmol/L (ref 135–145)

## 2015-06-17 LAB — CBC
HCT: 31.7 % — ABNORMAL LOW (ref 36.0–46.0)
Hemoglobin: 10.1 g/dL — ABNORMAL LOW (ref 12.0–15.0)
MCH: 28.9 pg (ref 26.0–34.0)
MCHC: 31.9 g/dL (ref 30.0–36.0)
MCV: 90.6 fL (ref 78.0–100.0)
Platelets: 237 10*3/uL (ref 150–400)
RBC: 3.5 MIL/uL — ABNORMAL LOW (ref 3.87–5.11)
RDW: 14 % (ref 11.5–15.5)
WBC: 15.9 10*3/uL — ABNORMAL HIGH (ref 4.0–10.5)

## 2015-06-17 LAB — GLUCOSE, CAPILLARY
Glucose-Capillary: 117 mg/dL — ABNORMAL HIGH (ref 65–99)
Glucose-Capillary: 126 mg/dL — ABNORMAL HIGH (ref 65–99)
Glucose-Capillary: 123 mg/dL — ABNORMAL HIGH (ref 65–99)
Glucose-Capillary: 121 mg/dL — ABNORMAL HIGH (ref 65–99)

## 2015-06-17 LAB — MAGNESIUM: Magnesium: 1.4 mg/dL — ABNORMAL LOW (ref 1.7–2.4)

## 2015-06-17 LAB — LACTIC ACID, PLASMA: Lactic Acid, Venous: 3.8 mmol/L (ref 0.5–2.0)

## 2015-06-17 LAB — PHOSPHORUS: Phosphorus: 5 mg/dL — ABNORMAL HIGH (ref 2.5–4.6)

## 2015-06-17 MED ORDER — KETOROLAC TROMETHAMINE 60 MG/2ML IM SOLN
15.0000 mg | Freq: Four times a day (QID) | INTRAMUSCULAR | Status: DC
  Administered 2015-06-17: 15 mg via INTRAMUSCULAR
  Filled 2015-06-17 (×2): qty 2

## 2015-06-17 MED ORDER — KETOROLAC TROMETHAMINE 15 MG/ML IJ SOLN
15.0000 mg | Freq: Four times a day (QID) | INTRAMUSCULAR | Status: DC
  Administered 2015-06-17 – 2015-06-19 (×7): 15 mg via INTRAVENOUS
  Filled 2015-06-17 (×7): qty 1

## 2015-06-17 MED ORDER — CETYLPYRIDINIUM CHLORIDE 0.05 % MT LIQD
7.0000 mL | Freq: Two times a day (BID) | OROMUCOSAL | Status: DC
  Administered 2015-06-17 – 2015-06-22 (×11): 7 mL via OROMUCOSAL

## 2015-06-17 MED ORDER — BUDESONIDE 0.5 MG/2ML IN SUSP
0.5000 mg | Freq: Two times a day (BID) | RESPIRATORY_TRACT | Status: DC
  Administered 2015-06-17 – 2015-06-21 (×9): 0.5 mg via RESPIRATORY_TRACT
  Filled 2015-06-17 (×10): qty 2

## 2015-06-17 MED ORDER — FENTANYL CITRATE (PF) 100 MCG/2ML IJ SOLN
12.5000 ug | INTRAMUSCULAR | Status: DC | PRN
  Administered 2015-06-17 – 2015-06-19 (×23): 25 ug via INTRAVENOUS
  Administered 2015-06-20: 12.5 ug via INTRAVENOUS
  Administered 2015-06-20 (×3): 25 ug via INTRAVENOUS
  Administered 2015-06-21: 12.5 ug via INTRAVENOUS
  Filled 2015-06-17 (×28): qty 2

## 2015-06-17 MED ORDER — DEXAMETHASONE SODIUM PHOSPHATE 4 MG/ML IJ SOLN
4.0000 mg | Freq: Two times a day (BID) | INTRAMUSCULAR | Status: DC
  Administered 2015-06-17 – 2015-06-19 (×5): 4 mg via INTRAVENOUS
  Filled 2015-06-17 (×5): qty 1

## 2015-06-17 MED ORDER — INSULIN ASPART 100 UNIT/ML ~~LOC~~ SOLN: 0.0000 [IU] | SUBCUTANEOUS | Status: DC

## 2015-06-17 MED FILL — Heparin Sodium (Porcine) Inj 1000 Unit/ML: INTRAMUSCULAR | Qty: 30 | Status: AC

## 2015-06-17 MED FILL — Sodium Chloride IV Soln 0.9%: INTRAVENOUS | Qty: 2000 | Status: AC

## 2015-06-17 NOTE — Evaluation (Signed)
Occupational Therapy Evaluation Patient Details Name: Rita Gregory MRN: [id number] DOB: 1945/11/13 Today's Date: 06/17/2015    History of Present Illness This 70 y.o. female admitted for intractable back, hip and leg pain.  She underwent Anterolateral decompression and fusion L1-2, L2-3, L3-4, x lift spacer.  Stage II posterior decompression or Rt sided L4 and L5 nerve roots with transforaminal interbody fusion L4-5 on Rt.  Segmental fixation from T12 to ilium with pedicle screws and rods, posterior lateral arthodesis from T12 to sacram with local autograft.  PMH includes:  degenerative scoliosis lumbar spine, diverticulosis of colon, fibromyalgia, HTN, PFO, chronic pain, migraine, parozysmal spells, s/p appendectomy, cholycystectomy, Rt TKA '05, Rt rotator cuff repair    Clinical Impression   Pt admitted with above. She demonstrates the below listed deficits and will benefit from continued OT to maximize safety and independence with BADLs.  Pt was lethargic during eval (possibly due to meds), she was limited by orthostasis (see below for details).  Currently, she requires max - total A for ADLs.  Will follow.         Follow Up Recommendations  CIR;Supervision/Assistance - 24 hour    Equipment Recommendations  None recommended by OT    Recommendations for Other Services       Precautions / Restrictions Precautions Precautions: Fall;Back Precaution Booklet Issued: No Precaution Comments: instructed pt in back precautions  Restrictions Weight Bearing Restrictions: No      Mobility Bed Mobility Overal bed mobility: Needs Assistance Bed Mobility: Rolling;Sidelying to Sit;Sit to Sidelying Rolling: Max assist Sidelying to sit: Max assist;+2 for physical assistance     Sit to sidelying: Max assist;+2 for physical assistance General bed mobility comments: Pt instructed in log rolling technique.  She requires assist for all aspects of bed mobility   Transfers                  General transfer comment: unable to attempt due to orthostasis    Balance Overall balance assessment: Needs assistance Sitting-balance support: Feet supported Sitting balance-Leahy Scale: Poor Sitting balance - Comments: Pt sits EOB with min guard to min A.  Posterior lean.  Cues to right posture  Postural control: Posterior lean                                  ADL Overall ADL's : Needs assistance/impaired Eating/Feeding: Maximal assistance;Sitting   Grooming: Maximal assistance;Bed level Grooming Details (indicate cue type and reason): Pt requires max encouragement to use bil. UEs  Upper Body Bathing: Total assistance;Bed level   Lower Body Bathing: Total assistance;Bed level   Upper Body Dressing : Total assistance;Bed level   Lower Body Dressing: Total assistance;Bed level   Toilet Transfer: Total assistance Toilet Transfer Details (indicate cue type and reason): unable to assess due to orthostasis Toileting- Clothing Manipulation and Hygiene: Total assistance;Bed level       Functional mobility during ADLs: Maximal assistance;+2 for physical assistance General ADL Comments: Pt moves slowly, requires encouragement to fully participate as she is very passive and tentative with her movement      Vision     Perception     Praxis      Pertinent Vitals/Pain Pain Assessment: 0-10 Pain Score: 7  Pain Location: Back  Pain Descriptors / Indicators: Burning;Other (Comment) (pinching ) Pain Intervention(s): Monitored during session;Premedicated before session;Repositioned     Hand Dominance     Extremity/Trunk Assessment Upper Extremity  Assessment Upper Extremity Assessment: Generalized weakness (Pt reports pain limits movement bil. )       Cervical / Trunk Assessment Cervical / Trunk Assessment: Other exceptions Cervical / Trunk Exceptions: multi level fusion    Communication Communication Communication: No difficulties   Cognition  Arousal/Alertness: Lethargic;Awake/alert Behavior During Therapy: Flat affect Overall Cognitive Status: Impaired/Different from baseline (likely due to meds.  Delayed responses )                     General Comments       Exercises       Shoulder Instructions      Home Living Family/patient expects to be discharged to:: Inpatient rehab Living Arrangements: Spouse/significant other                               Additional Comments: spouse is MD and is currently working full time       Prior Functioning/Environment Level of Independence: Needs assistance  Gait / Transfers Assistance Needed: amb          OT Diagnosis: Acute pain;Generalized weakness   OT Problem List: Decreased strength;Decreased activity tolerance;Impaired balance (sitting and/or standing);Decreased safety awareness;Decreased knowledge of use of DME or AE;Decreased knowledge of precautions;Pain   OT Treatment/Interventions: Self-care/ADL training;DME and/or AE instruction;Therapeutic activities;Patient/family education;Balance training    OT Goals(Current goals can be found in the care plan section) Acute Rehab OT Goals Patient Stated Goal: To regain independence  OT Goal Formulation: With patient/family Time For Goal Achievement: 07/01/15 Potential to Achieve Goals: Good ADL Goals Pt Will Perform Eating: with modified independence;sitting Pt Will Perform Grooming: with modified independence;sitting Pt Will Perform Upper Body Bathing: with supervision;sitting Pt Will Perform Lower Body Bathing: with min assist;with adaptive equipment;sit to/from stand Pt Will Perform Upper Body Dressing: with supervision;sitting Pt Will Perform Lower Body Dressing: with min assist;with adaptive equipment;sit to/from stand Pt Will Transfer to Toilet: with min assist;ambulating;regular height toilet;bedside commode;grab bars Pt Will Perform Toileting - Clothing Manipulation and hygiene: with min  assist;sit to/from stand  OT Frequency: Min 2X/week   Barriers to D/C: Decreased caregiver support          Co-evaluation PT/OT/SLP Co-Evaluation/Treatment: Yes Reason for Co-Treatment: For patient/therapist safety          End of Session Equipment Utilized During Treatment: Back brace Nurse Communication: Mobility status;Other (comment) (BP)  Activity Tolerance: Other (comment) (orthostasis) Patient left: in bed;with call bell/phone within reach;with family/visitor present   Time: [id number] OT Time Calculation (min): 28 min Charges:  OT General Charges $OT Visit: 1 Procedure OT Evaluation $OT Eval Moderate Complexity: 1 Procedure G-Codes:    Celita Aron M 2015/06/21, 1:37 PM

## 2015-06-17 NOTE — Consult Note (Signed)
Physical Medicine and Rehabilitation Consult   Reason for Consult: Progressive Kyphoscoliosis with lumbar radiculopathy Referring Physician: Dr. Ellene Route.    HPI: Rita Gregory is a 70 y.o. female with history for HTN, fibromyalgia, chronic pain, kyphoscoliosis with increase in curve to 40 degrees who was admitted with intractible right buttock and back pain.and inability to ambulate on 06/12/15. Patient lives with her husband in [place], [place]. Her husband is a practicing physician for [employer].  She does not have 24/7 support at discharge.  Two-level home with bedroom downstairs. She used a walker prior to admission however the past month she has been very sedentary. No local family to assist. She was admitted for pain management and required sedation for MRI which revealed progression in scoliotic stenosis on right with L4-5 and L5-S1 and L2/3 and L3/4 on left. She elected to undergo two stage procedure--anterolateral decompression with fusion from L1 to L4 followed by posterior decompression of right side L4 and L5 nerve roots with fusion of L4/5 and segmental fixation from T12 to illium with pedicle screws and rods on 06/16/15. Post op with hypotension and lethargy affecting mobility. Pt with limitted participation in exam.  PT/OT evaluations done and CIR recommended for follow up therapy.    Review of Systems  Constitutional: Negative for fever and chills.  HENT: Negative for hearing loss.   Eyes: Negative for blurred vision and double vision.  Respiratory: Negative for cough and shortness of breath.   Cardiovascular: Negative for chest pain, palpitations and leg swelling.  Gastrointestinal: Positive for constipation. Negative for nausea and vomiting.       GERD  Musculoskeletal: Positive for myalgias, back pain and joint pain.  Skin: Negative for rash.  Neurological: Positive for dizziness, weakness and headaches. Negative for seizures.    Psychiatric/Behavioral: Positive for depression. The patient has insomnia.   All other systems reviewed and are negative.    Past Medical History  Diagnosis Date  . Diverticulosis of colon (without mention of hemorrhage) 11/09/2007  . Colon polyp 11/09/2007    Hyperplastic   . Esophageal reflux   . Fibromyalgia   . Hypertension   . Kyphoscoliosis     40 degree bend ro right side  . Chronic pain     Dr. Ellene Route follows  . Patent foramen ovale     hx.- Dr. Lemmie Evens. Smith,cardiology- follows  . Migraine   . Paroxysmal spells ([place]) 02/13/2013    Past Surgical History  Procedure Laterality Date  . Appendectomy    . Cholecystectomy    . Abdominal hysterectomy    . Joint replacement      RTKA '05  . Knee arthroscopy w/ acl reconstruction      Rt. Knee  . Dry eyes      uses Restasis daily  . Cesarean section      x2  . Right rotator cuff  2011    sx  . Esophagogastroduodenoscopy  02/23/2012    Procedure: ESOPHAGOGASTRODUODENOSCOPY (EGD);  Surgeon: Irene Shipper, MD;  Location: [facility];  Service: Endoscopy;  Laterality: N/A;  . Radiology with anesthesia N/A 06/15/2015    Procedure: RADIOLOGY WITH ANESTHESIA;  Surgeon: Medication Radiologist, MD;  Location: [facility];  Service: Radiology;  Laterality: N/A;  . Anterior lateral lumbar fusion 4 levels Left 06/16/2015    Procedure: ANTERIOR LATERAL LUMBAR FUSION THORACIC TWELVE-LUMBAR FOUR;  Surgeon: Kristeen Miss, MD;  Location: [facility];  Service: Neurosurgery;  Laterality: Left;  Thoracolumbar spine  .  Posterior lumbar fusion 4 level N/A 06/16/2015    Procedure: POSTERIOR LUMBAR FUSION LUMBAR FOUR-FIVE LUMBAR FIVE-SACRAL ONE;  Surgeon: Kristeen Miss, MD;  Location: [place];  Service: Neurosurgery;  Laterality: N/A;    Family History  Problem Relation Age of Onset  . Ovarian cancer Mother     passed at age 60  . Heart disease Father   . Kidney disease Father     Kidney cancer  . Stomach cancer Paternal Uncle   . Diabetes type II  Father     passed at 12 yrs old  . Colon cancer Father     cancereous polyps  . Bipolar disorder Sister     Osteopenia  . Bipolar disorder Son     Social History:  Married--was ambulating with a cane PTA. Husband is full time physician.  reports that she has been passively smoking.  She does not have any smokeless tobacco history on file. She reports that she drinks about 0.5 - 1.5 oz of alcohol per week. She reports that she does not use illicit drugs.   Allergies  Allergen Reactions  . Fentanyl Shortness Of Breath    Pt has tolerated this medication 06/2015 pre and during operation. MD ok giving.  . Morphine And Related Anaphylaxis    respiratory depression   . Bupropion Hcl     Dizzy, fog  . Codeine Nausea And Vomiting  . Demerol [Meperidine] Nausea And Vomiting  . Dextromethorphan Other (See Comments)    Unknown   . Guaifenesin & Derivatives     Other, unknown  . Lipitor [Atorvastatin]   . Lovaza [Omega-3-Acid Ethyl Esters] Diarrhea  . Lyrica [Pregabalin]     Fatigue and depression  . Naltrexone     depression  . Percodan [Oxycodone-Aspirin] Nausea And Vomiting  . Prilosec [Omeprazole]     Iliacs    . Topamax [Topiramate]     disorientation   . Valium [Diazepam]     Small doses are OK but large doses are not. Long-term use causes depression.   . Versed [Midazolam]     In moderate in large doses shortness of breath, and respiratory depression   . Adhesive [Tape] Rash    Skin irritation   . Concerta [Methylphenidate] Palpitations  . Lamotrigine Rash  . Nickel Rash    Skin irritation   . Oxycodone-Acetaminophen Nausea And Vomiting and Rash    depression  . Percocet [Oxycodone-Acetaminophen] Nausea And Vomiting and Rash  . Tegaderm Ag Mesh [Silver] Rash   Medications Prior to Admission  Medication Sig Dispense Refill  . acetaminophen (TYLENOL) 650 MG CR tablet Take 650 mg by mouth every 8 (eight) hours as needed for pain.    Marland Kitchen albuterol (PROVENTIL HFA;VENTOLIN  HFA) 108 (90 BASE) MCG/ACT inhaler Inhale 2 puffs into the lungs every 6 (six) hours as needed. For shortness of breath    . Choline Fenofibrate (TRILIPIX) 135 MG capsule Take 135 mg by mouth at bedtime.    . cycloSPORINE (RESTASIS) 0.05 % ophthalmic emulsion Place 1 drop into both eyes 2 (two) times daily.    Marland Kitchen EPINEPHrine (EPIPEN 2-PAK) 0.3 mg/0.3 mL DEVI Inject 0.3 mg into the muscle once. To medications, touch, or inhaltions    . escitalopram (LEXAPRO) 10 MG tablet Take 10 mg by mouth 2 (two) times daily.    Marland Kitchen estrogens, conjugated, (PREMARIN) 0.3 MG tablet Take 0.3 mg by mouth daily.    Marland Kitchen gabapentin (NEURONTIN) 100 MG capsule Take 1 capsule (100 mg total)  by mouth 2 (two) times daily. 60 capsule 3  . HYDROcodone-acetaminophen (NORCO/VICODIN) 5-325 MG tablet Take 1-2 tablets by mouth every 6 (six) hours as needed. pain  0  . ketoconazole (NIZORAL) 2 % cream Apply 1 application topically 2 (two) times daily as needed for irritation.     Marland Kitchen L-Methylfolate-Algae (DEPLIN 15) 15-90.314 MG CAPS Take 1 capsule by mouth daily.  1  . lansoprazole (PREVACID) 30 MG capsule Take 30 mg by mouth 2 (two) times daily before a meal.    . levothyroxine (SYNTHROID, LEVOTHROID) 25 MCG tablet Take 25 mcg by mouth at bedtime.    Marland Kitchen lisinopril (PRINIVIL,ZESTRIL) 2.5 MG tablet Take 1.25 mg by mouth every morning.     . methylphenidate (DAYTRANA) 10 mg/9hr Place 1 patch onto the skin daily as needed (15mg  daily/10 mg as a prn). Take if extra if needed for ADHD. Wear patch for 9 hours only each day    . methylphenidate (DAYTRANA) 15 mg/9hr Place 1 patch onto the skin daily. wear patch for 9 hours only each day    . pravastatin (PRAVACHOL) 40 MG tablet Take 40 mg by mouth at bedtime.    . pyridOXINE (VITAMIN B-6) 100 MG tablet Take 100 mg by mouth daily.     . ranitidine (ZANTAC) 150 MG capsule Take 150 mg by mouth 2 (two) times daily as needed. For GERD    . testosterone (ANDROGEL) 50 MG/5GM GEL Place 5 g onto the skin  See admin instructions. Three times weekly as needed for hormone replacement    . traZODone (DESYREL) 50 MG tablet Take 50 mg by mouth at bedtime.       Home: Home Living Family/patient expects to be discharged to:: Inpatient rehab Living Arrangements: Spouse/significant other Additional Comments: spouse is MD and is currently working full time   Functional History: Prior Function Level of Independence: Needs assistance Gait / Transfers Assistance Needed: Ambulates with a cane until pain became worse PTA. Functional Status:  Mobility: Bed Mobility Overal bed mobility: Needs Assistance Bed Mobility: Rolling, Sidelying to Sit, Sit to Sidelying Rolling: Max assist Sidelying to sit: Max assist, +2 for physical assistance Sit to sidelying: Max assist, +2 for physical assistance General bed mobility comments: Pt instructed in log rolling technique.  She requires assist for all aspects of bed mobility   pt Orthostatic in sitting with BP decreasing from 90/50 to 66/37. Transfers General transfer comment: unable to attempt due to orthostasis      ADL: ADL Overall ADL's : Needs assistance/impaired Eating/Feeding: Maximal assistance, Sitting Grooming: Maximal assistance, Bed level Grooming Details (indicate cue type and reason): Pt requires max encouragement to use bil. UEs  Upper Body Bathing: Total assistance, Bed level Lower Body Bathing: Total assistance, Bed level Upper Body Dressing : Total assistance, Bed level Lower Body Dressing: Total assistance, Bed level Toilet Transfer: Total assistance Toilet Transfer Details (indicate cue type and reason): unable to assess due to orthostasis Toileting- Clothing Manipulation and Hygiene: Total assistance, Bed level Functional mobility during ADLs: Maximal assistance, +2 for physical assistance General ADL Comments: Pt moves slowly, requires encouragement to fully participate as she is very passive and tentative with her movement    Cognition: Cognition Overall Cognitive Status: Impaired/Different from baseline (Likely due to meds.  Delayed responses.) Orientation Level: Oriented X4 Cognition Arousal/Alertness: Lethargic, Suspect due to medications (pt and spouse indicate pain meds make her drowsy.) Behavior During Therapy: Flat affect Overall Cognitive Status: Impaired/Different from baseline (Likely due to meds.  Delayed  responses.)   Blood pressure 87/62, pulse 86, temperature 97.1 F (36.2 C), temperature source Axillary, resp. rate 11, height 5\' 4"  (1.626 m), weight 72.4 kg (159 lb 9.8 oz), SpO2 95 %. Physical Exam  Vitals reviewed. Constitutional: She is oriented to person, place, and time. She appears well-developed and well-nourished.  HENT:  Head: Normocephalic and atraumatic.  Eyes: Conjunctivae and EOM are normal. No scleral icterus.  Neck: Normal range of motion. Neck supple. No thyromegaly present.  Cardiovascular: Normal rate and regular rhythm.   Respiratory: Effort normal and breath sounds normal. No respiratory distress.  GI: Soft. Bowel sounds are normal. She exhibits no distension.  Musculoskeletal: She exhibits tenderness (Back). She exhibits no edema.  Neurological: She is oriented to person, place, and time.  Lethargic.  Follows simple commands answers basic questions. Sensation intact to light touch Motor: limitted by participation, B/l UE: 3-/5 proximal to distal B/l LE 2+/5 proximal to distal DTRs symmetric   Skin: Skin is warm and dry.  Back incision is dressed  Psychiatric: Her affect is blunt. Her speech is delayed. She is slowed.    Results for orders placed or performed during the hospital encounter of 06/12/15 (from the past 24 hour(s))  Glucose, capillary     Status: Abnormal   Collection Time: 06/16/15 10:53 PM  Result Value Ref Range   Glucose-Capillary 212 (H) 65 - 99 mg/dL  CBC     Status: Abnormal   Collection Time: 06/17/15  3:00 AM  Result Value Ref Range    WBC 15.9 (H) 4.0 - 10.5 K/uL   RBC 3.50 (L) 3.87 - 5.11 MIL/uL   Hemoglobin 10.1 (L) 12.0 - 15.0 g/dL   HCT 31.7 (L) 36.0 - 46.0 %   MCV 90.6 78.0 - 100.0 fL   MCH 28.9 26.0 - 34.0 pg   MCHC 31.9 30.0 - 36.0 g/dL   RDW 14.0 11.5 - 15.5 %   Platelets 237 150 - 400 K/uL  Basic Metabolic Panel     Status: Abnormal   Collection Time: 06/17/15  3:00 AM  Result Value Ref Range   Sodium 137 135 - 145 mmol/L   Potassium 4.3 3.5 - 5.1 mmol/L   Chloride 105 101 - 111 mmol/L   CO2 21 (L) 22 - 32 mmol/L   Glucose, Bld 214 (H) 65 - 99 mg/dL   BUN 18 6 - 20 mg/dL   Creatinine, Ser 0.93 0.44 - 1.00 mg/dL   Calcium 7.7 (L) 8.9 - 10.3 mg/dL   GFR calc non Af Amer >60 >60 mL/min   GFR calc Af Amer >60 >60 mL/min   Anion gap 11 5 - 15  Lactic acid, plasma     Status: Abnormal   Collection Time: 06/17/15  8:39 AM  Result Value Ref Range   Lactic Acid, Venous 3.8 (HH) 0.5 - 2.0 mmol/L  Magnesium     Status: Abnormal   Collection Time: 06/17/15  8:39 AM  Result Value Ref Range   Magnesium 1.4 (L) 1.7 - 2.4 mg/dL  Phosphorus     Status: Abnormal   Collection Time: 06/17/15  8:39 AM  Result Value Ref Range   Phosphorus 5.0 (H) 2.5 - 4.6 mg/dL  Glucose, capillary     Status: Abnormal   Collection Time: 06/17/15 10:12 AM  Result Value Ref Range   Glucose-Capillary 117 (H) 65 - 99 mg/dL   Comment 1 Notify RN    Comment 2 Document in Chart   Glucose, capillary  Status: Abnormal   Collection Time: 06/17/15 11:53 AM  Result Value Ref Range   Glucose-Capillary 126 (H) 65 - 99 mg/dL   Comment 1 Notify RN    Comment 2 Document in Chart    Dg Chest 1 View  06/17/2015  CLINICAL DATA:  70 year old female with dyspnea EXAM: CHEST 1 VIEW COMPARISON:  Prior chest x-ray 03/13/2012 FINDINGS: Stable cardiac contours. Significant widening of the mediastinum compared to prior imaging. Inspiratory volumes are low. There is mild bibasilar atelectasis. Incompletely imaged posterior lumbar fixation hardware. No  acute osseous abnormality. IMPRESSION: 1. Significant widening of the mediastinum compared to the prior chest x-ray from December of 2013. This may be artifactual and related to a combination of portable frontal technique and low inspiratory volumes. However, interval development of an ascending aortic aneurysm, mediastinal hematoma, or mediastinal mass is difficult to exclude entirely. Recommend dedicated PA and lateral chest x-ray. If the mediastinum again appears widened, further evaluation with CTA of the chest may be warranted. 2. Low inspiratory volumes with mild bibasilar atelectasis. These results will be called to the ordering clinician or representative by the Radiologist Assistant, and communication documented in the PACS or zVision Dashboard. Electronically Signed   By: Jacqulynn Cadet M.D.   On: 06/17/2015 08:04   Dg Lumbar Spine 2-3 Views  06/16/2015  CLINICAL DATA:  T12-S1 lumbar fusion EXAM: DG C-ARM GT 120 MIN; LUMBAR SPINE - 2-3 VIEW FLUOROSCOPY TIME:  Fluoroscopy Time (in minutes and seconds): 1 minutes 15 seconds COMPARISON:  Lumbar spine CT dated 06/16/2015 FINDINGS: Intraoperative fluoroscopic images during T12-S1 posterior lumbar fusion. Two frontal and three lateral images were obtained, demonstrating surgical hardware in satisfactory position. IMPRESSION: Intraoperative fluoroscopic images, as above. Electronically Signed   By: Julian Hy M.D.   On: 06/16/2015 21:32   Dg Pelvis 1-2 Views  06/15/2015  CLINICAL DATA:  Preoperative examination.  Right hip pain. EXAM: PELVIS - 1-2 VIEW COMPARISON:  None. FINDINGS: There is no evidence of pelvic fracture or diastasis. No pelvic bone lesions are seen. Visualized hips appear intact. IMPRESSION: Negative. Electronically Signed   By: Lucienne Capers M.D.   On: 06/15/2015 21:21   Ct Lumbar Spine Wo Contrast  06/16/2015  CLINICAL DATA:  Initial evaluation for right hip pain. Patient scheduled for spinal surgery tomorrow. EXAM: CT LUMBAR  SPINE WITHOUT CONTRAST TECHNIQUE: Multidetector CT imaging of the lumbar spine was performed without intravenous contrast administration. Multiplanar CT image reconstructions were also generated. COMPARISON:  Prior MRI of the lumbar spine performed on 06/15/2015. FINDINGS: Severe dextroscoliosis of the lumbar spine with apex at L3 again seen. Vertebral body heights are maintained without evidence of fracture. Trace retrolisthesis of L2 on L3. No other significant listhesis. Paraspinous soft tissues demonstrate no acute abnormality. Mild fatty atrophy within the posterior paraspinous musculature. Visualized visceral structures within normal limits. 13 mm cyst present within the lower pole left kidney. Cholecystectomy clips noted. Mild scattered atheromatous plaque within the infrarenal aorta. No aneurysm. No retroperitoneal adenopathy. T12-L1:  Mild disc bulge without significant stenosis. L1-2: Degenerative intervertebral disc space narrowing with disc desiccation. Reactive endplate sclerosis with prominent endplate anterior osteophytic spurring, predominantly at the right anterolateral aspect of the L2-3 disc space. Mild bilateral facet arthrosis, slightly worse on the right. Associated right lateral recess stenosis at this level better evaluated on previous MRI. L2-3: Degenerative intervertebral disc space narrowing. Reactive endplate changes, predominantly at the left aspect of the L2-3 disc space. Bulky endplate osteophytic spurring from the superior endplate of  L3, and to a lesser extent the inferior endplate of L2. Disc bulging, asymmetric to the left, better evaluated on prior MRI. Bilateral facet arthrosis, slightly worse on the left. Associated left lateral recess stenosis at this level better evaluated on prior MRI. L3-4: Degenerative intervertebral disc space narrowing with disc desiccation. Reactive endplate changes with endplate osteophytic spurring, predominantly at the left aspect of the L3-4 disc  space. Disc bulging, asymmetric to the left, better evaluated on MRI. Left greater than right facet disease. Gas within the epidural space at this level likely degenerative in nature. Associated left lateral recess stenosis at this level better evaluated on prior MRI. L4-5: Degenerative disc bulge with disc desiccation. Moderate to severe right-sided facet disease. More moderate left-sided facet arthrosis. Broad-based disc protrusion better evaluated on prior MRI. Mild to moderate right lateral recess stenosis better seen on previous MRI. Moderate right foraminal narrowing related to bony osteophytic spurring from the right L4-5 S at. L5-S1: Degenerative intervertebral disc space narrowing with reactive endplate changes. Prominent endplate osteophytic spurring at the right aspect of the L5-S1 disc space. Severe facet arthrosis on the right. Resultant moderate right foraminal narrowing P no significant canal stenosis. IMPRESSION: 1. Severe dextroscoliosis with apex at L3. 2. Multilevel degenerative intervertebral disc space narrowing, disc desiccation and reactive endplate sclerosis and osteophytosis, with facet disease as above. There is right-sided lateral recess stenosis at L1-2, L4-5, and L5-S1, with left lateral recess stenosis at L2-3 and L3-4. Moderate right foraminal stenosis at L4-5 and L5-S1. These stenoses are somewhat better appreciated on recent MRI from 06/15/2015. This study will be used for surgical planning purposes. Electronically Signed   By: Jeannine Boga M.D.   On: 06/16/2015 03:27   Mr Lumbar Spine Wo Contrast  06/15/2015  CLINICAL DATA:  Severe intractable back pain with right lumbar radiculopathy. Scoliosis. EXAM: MRI LUMBAR SPINE WITHOUT CONTRAST TECHNIQUE: Multiplanar, multisequence MR imaging of the lumbar spine was performed. No intravenous contrast was administered. COMPARISON:  Lumbar spine radiographs 11/12/2014. FINDINGS: Normal signal is present in the conus medullaris which  terminates at T12-L1. Severe rightward curvature of the lumbar spine is centered at L2-3. There is asymmetric left-sided endplate marrow change at L2-3 and right-sided endplate marrow change at L1-2. Vertebral body heights maintained. There is slight retrolisthesis at L2-3. AP alignment is otherwise anatomic. Limited imaging of the abdomen demonstrates a benign cyst in the right kidney posteriorly. No other focal lesions are evident. T12-L1:  Negative. L1-2: A broad-based disc protrusion is present. There is mild facet hypertrophy bilaterally. Mild foraminal narrowing is present on the right. L2-3: A broad-based disc protrusion is asymmetric to the left. This results in moderate left subarticular stenosis. Asymmetric left-sided facet hypertrophy contributes. Moderate to severe left foraminal narrowing is present. There is mild right foraminal stenosis. L3-4: A broad-based disc protrusion is asymmetric to the left. Mild to moderate left subarticular narrowing is present. Moderate left and mild right foraminal narrowing is present as well. L4-5: Moderate facet hypertrophy is present bilaterally. There is a broad-based disc protrusion. This results in mild to moderate subarticular and foraminal narrowing bilaterally, right greater than left. L5-S1: Asymmetric moderate facet hypertrophy is present. The central canal is patent. Moderate right foraminal stenosis is present. IMPRESSION: 1. Severe dextro convex scoliosis is centered at L2-3. 2. Mild foraminal narrowing on the right at L1-2. 3. Moderate left subarticular and moderate to severe left foraminal narrowing at L2-3. 4. Mild to moderate left subarticular stenosis at L3-4. 5. Moderate left and mild  right foraminal narrowing at L3-4. 6. Mild to moderate subarticular and foraminal narrowing bilaterally at L4-5 is worse on the right. 7. Moderate right foraminal stenosis at L5-S1. Electronically Signed   By: San Morelle M.D.   On: 06/15/2015 15:32   Dg C-arm  Gt 120 Min  06/16/2015  CLINICAL DATA:  T12-S1 lumbar fusion EXAM: DG C-ARM GT 120 MIN; LUMBAR SPINE - 2-3 VIEW FLUOROSCOPY TIME:  Fluoroscopy Time (in minutes and seconds): 1 minutes 15 seconds COMPARISON:  Lumbar spine CT dated 06/16/2015 FINDINGS: Intraoperative fluoroscopic images during T12-S1 posterior lumbar fusion. Two frontal and three lateral images were obtained, demonstrating surgical hardware in satisfactory position. IMPRESSION: Intraoperative fluoroscopic images, as above. Electronically Signed   By: Julian Hy M.D.   On: 06/16/2015 21:32    Assessment/Plan: Diagnosis: Progressive Kyphoscoliosis with lumbar radiculopathy  Labs and images independently reviewed.  Records reviewed and summated above.  1. Does the need for close, 24 hr/day medical supervision in concert with the patient's rehab needs make it unreasonable for this patient to be served in a less intensive setting? Yes  2. Co-Morbidities requiring supervision/potential complications: lethargy (wean sedating meds, encourage sleepy hygiene), hypotension (monitor BP with increased activity), HTN (monitor and provide prns in accordance with increased physical exertion and pain), fibromyalgia (ensure pain does not limit functional progress), chronic and post-op pain (Biofeedback training with therapies to help reduce reliance on opiate pain medications, monitor pain control during therapies, and sedation at rest and titrate to maximum efficacy to ensure participation and gains in therapies), respiratory depression (wean respiratory depressing meds as tolerated), ABLA (transfuse if necessary to ensure appropriate perfusion for increased activity tolerance), leukocytosis (prior to steroids - cont to monitor for signs and symptoms of infection, further workup if indicated), hypomagnesemia (cont to monitor and replete as necessary) 3. Due to bladder management, safety, skin/wound care, disease management, medication administration,  pain management and patient education, does the patient require 24 hr/day rehab nursing? Yes 4. Does the patient require coordinated care of a physician, rehab nurse, PT (1-2 hrs/day, 5 days/week) and OT (1-2 hrs/day, 5 days/week) to address physical and functional deficits in the context of the above medical diagnosis(es)? Yes Addressing deficits in the following areas: balance, endurance, locomotion, strength, transferring, bathing, dressing, grooming, toileting and psychosocial support 5. Can the patient actively participate in an intensive therapy program of at least 3 hrs of therapy per day at least 5 days per week? Not at present 6. The potential for patient to make measurable gains while on inpatient rehab is excellent 7. Anticipated functional outcomes upon discharge from inpatient rehab are min assist and mod assist  with PT, min assist and mod assist with OT, n/a with SLP. 8. Estimated rehab length of stay to reach the above functional goals is: 18-22 days. 9. Does the patient have adequate social supports and living environment to accommodate these discharge functional goals? No 10. Anticipated D/C setting: Other 11. Anticipated post D/C treatments: HH therapy and Home excercise program 12. Overall Rehab/Functional Prognosis: excellent  RECOMMENDATIONS: This patient's condition is appropriate for continued rehabilitative care in the following setting: Based on pt's current level of functioning, it is unlikely she will be able to obtain an independent level of functioning after a short IRF stay.  Further, she is unable to tolerate 3 hours therapy/day.  Based on current status, SNF would be more appropriate discharge disposition.  Will cont to follow once more medically stable. Patient has agreed to participate in recommended program.  Potentially Note that insurance prior authorization may be required for reimbursement for recommended care.  Comment: Rehab Admissions Coordinator to follow  up.  Delice Lesch, MD 06/17/2015

## 2015-06-17 NOTE — Progress Notes (Signed)
Orthopedic Tech Progress Note Patient Details:  Rita Gregory 1945-11-07 [id number]  Patient ID: Rita Gregory, female   DOB: 1946/01/24, 70 y.o.   MRN: [id number] Called in [place] order; spoke with [name], [name] 06/17/2015, 9:40 AM

## 2015-06-17 NOTE — Progress Notes (Signed)
Inpatient Diabetes Program Recommendations  AACE/ADA: New Consensus Statement on Inpatient Glycemic Control (2015)  Target Ranges:  Prepandial:   less than 140 mg/dL      Peak postprandial:   less than 180 mg/dL (1-2 hours)      Critically ill patients:  140 - 180 mg/dL   Review of Glycemic Control:  Results for ALIS, SIEMENS (MRN [id number]) as of 06/17/2015 09:15  Ref. Range 06/16/2015 22:53  Glucose-Capillary Latest Ref Range: 65-99 mg/dL 212 (H)  Results for WILMER, FLANNIGAN (MRN [id number]) as of 06/17/2015 09:15  Ref. Range 06/17/2015 03:00  Glucose Latest Ref Range: 65-99 mg/dL 214 (H)   Diabetes history: None  Inpatient Diabetes Program Recommendations:   Note that lab glucose and capillary glucose elevated.  May consider checking blood sugars q 4 hours and add Novolog correction if blood sugars greater than 150 mg/dL.  Thanks, Adah Perl, RN, BC-ADM Inpatient Diabetes Coordinator Pager [phone] (8a-5p)

## 2015-06-17 NOTE — Progress Notes (Signed)
Patient ID: Rita Gregory, female   DOB: Jan 01, 1946, 70 y.o.   MRN: [id number] Vital signs are stable Motor function appears intact in lower extremities She notes pain throughout She is receiving hourly doses of Sublimaze and appears to be tolerating this well Urine output down to about 40 mL/h Drain has 200 mL of output Plan on mobilizing today when brace arrives Report of chest x-ray noted with evidence for widened mediastinum on singular AP view Will obtain better x-ray when patient is mobilizing more fully.

## 2015-06-17 NOTE — Evaluation (Signed)
Physical Therapy Evaluation Patient Details Name: PROMISE CREA MRN: [id number] DOB: Oct 16, 1945 Today's Date: 06/17/2015   History of Present Illness  This 70 y.o. female admitted for intractable back, hip and leg pain.  She underwent Anterolateral decompression and fusion L1-2, L2-3, L3-4, x lift spacer.  Stage II posterior decompression or Rt sided L4 and L5 nerve roots with transforaminal interbody fusion L4-5 on Rt.  Segmental fixation from T12 to ilium with pedicle screws and rods, posterior lateral arthodesis from T12 to sacram with local autograft.  PMH includes:  degenerative scoliosis lumbar spine, diverticulosis of colon, fibromyalgia, HTN, PFO, chronic pain, migraine, parozysmal spells, s/p appendectomy, cholycystectomy, Rt TKA '05, Rt rotator cuff repair   Clinical Impression  Pt drowsy from pain meds and overall mobility was limited by Orthostatic BPs.  In Supine 88/60, initially sitting EOB 90/50, after sitting 56mins 66/37, and after returning to supine 87/65.  Feel pt will need continued therapy to maximize independence and decrease burden of care prior to returning to home.  Will continue to follow.      Follow Up Recommendations CIR    Equipment Recommendations  None recommended by PT    Recommendations for Other Services Rehab consult     Precautions / Restrictions Precautions Precautions: Fall;Back Precaution Booklet Issued: No Precaution Comments: instructed pt in back precautions  Restrictions Weight Bearing Restrictions: No      Mobility  Bed Mobility Overal bed mobility: Needs Assistance Bed Mobility: Rolling;Sidelying to Sit;Sit to Sidelying Rolling: Max assist Sidelying to sit: Max assist;+2 for physical assistance     Sit to sidelying: Max assist;+2 for physical assistance General bed mobility comments: Pt instructed in log rolling technique.  She requires assist for all aspects of bed mobility   pt Orthostatic in sitting with BP decreasing from 90/50  to 66/37.  Transfers                 General transfer comment: unable to attempt due to orthostasis  Ambulation/Gait                Stairs            Wheelchair Mobility    Modified Rankin (Stroke Patients Only)       Balance Overall balance assessment: Needs assistance Sitting-balance support: Bilateral upper extremity supported;Feet supported Sitting balance-Leahy Scale: Poor Sitting balance - Comments: pt requires MinG to [facility] to maintain balance and minimally uses her UEs to A.  pt leans posteriorly despite cueing. Postural control: Posterior lean                                   Pertinent Vitals/Pain Pain Assessment: 0-10 Pain Score: 7  Pain Location: Back Pain Descriptors / Indicators: Burning (Pinching) Pain Intervention(s): Monitored during session;Repositioned;Premedicated before session    [place] expects to be discharged to:: Inpatient rehab Living Arrangements: Spouse/significant other               Additional Comments: spouse is MD and is currently working full time     Prior Function Level of Independence: Needs assistance   Gait / Transfers Assistance Needed: Ambulates with a cane until pain became worse PTA.           Hand Dominance        Extremity/Trunk Assessment   Upper Extremity Assessment: Defer to OT evaluation           Lower  Extremity Assessment: Generalized weakness;RLE deficits/detail;LLE deficits/detail RLE Deficits / Details: Minimal active movement noted, which pt indicates is due to pain in back.  Sensation intact. LLE Deficits / Details: Minmal Active movement noted, which pt indicates is due to pain.  Sensation intact.  Cervical / Trunk Assessment: Other exceptions  Communication   Communication: No difficulties  Cognition Arousal/Alertness: Lethargic;Suspect due to medications (pt and spouse indicate pain meds make her drowsy.) Behavior During Therapy: Flat  affect Overall Cognitive Status: Impaired/Different from baseline (Likely due to meds.  Delayed responses.)                      General Comments General comments (skin integrity, edema, etc.): BP supine 88/60, seated 90/50, sitting EOB 66/37 (symptomatic), 87/65 supine.   spouse present during eval .     Exercises        Assessment/Plan    PT Assessment Patient needs continued PT services  PT Diagnosis Difficulty walking;Generalized weakness;Acute pain   PT Problem List Decreased strength;Decreased activity tolerance;Decreased balance;Decreased mobility;Decreased coordination;Decreased cognition;Decreased knowledge of use of DME;Decreased safety awareness;Decreased knowledge of precautions;Cardiopulmonary status limiting activity;Pain  PT Treatment Interventions DME instruction;Stair training;Gait training;Functional mobility training;Therapeutic activities;Therapeutic exercise;Balance training;Neuromuscular re-education;Cognitive remediation;Patient/family education   PT Goals (Current goals can be found in the Care Plan section) Acute Rehab PT Goals Patient Stated Goal: To regain independence  PT Goal Formulation: With patient/family Time For Goal Achievement: 07/01/15 Potential to Achieve Goals: Good    Frequency Min 5X/week   Barriers to discharge Decreased caregiver support Husband works    Co-evaluation PT/OT/SLP Co-Evaluation/Treatment: Yes Reason for Co-Treatment: For patient/therapist safety PT goals addressed during session: Mobility/safety with mobility;Balance         End of Session Equipment Utilized During Treatment: Back brace;Oxygen Activity Tolerance: Treatment limited secondary to medical complications (Comment) (Orthostatic BP) Patient left: in bed;with call bell/phone within reach;with family/visitor present Nurse Communication: Mobility status (BPs)         Time: [id number] PT Time Calculation (min) (ACUTE ONLY): 26 min   Charges:   PT  Evaluation $PT Eval Moderate Complexity: 1 Procedure     PT G CodesCatarina Hartshorn, [place] [phone] 06/17/2015, 2:15 PM

## 2015-06-17 NOTE — Progress Notes (Signed)
Rehab Admissions Coordinator Note:  Patient was screened by Cleatrice Burke for appropriateness for an Inpatient Acute Rehab Consult per OT recommendation.  At this time, we are recommending Inpatient Rehab consult. Please place order if you would like [place] to consider pt for a possible inpt rehab admission.    Cleatrice Burke 06/17/2015, 2:16 PM  I can be reached at [phone].

## 2015-06-17 NOTE — Progress Notes (Signed)
Name: Rita Gregory MRN: [id number] DOB: 1945/08/28    ADMISSION DATE:  06/12/2015 CONSULTATION DATE:  06/16/15  REFERRING MD :  Ellene Route  CHIEF COMPLAINT:  Post op management   HISTORY OF PRESENT ILLNESS: Pt is somnolent post anesthesia; therefore, this HPI is obtained from chart review.   Rita Gregory is a 70 y.o. female with a PMH as outlined below including progressive degenerative scoliosis for which she has been followed by Dr. Ellene Route for several years now.  She had been having progressive decline and deterioration in her level of function and in the past week or 2, had intractable pain.  Of note, she has multiple allergies to various narcotics making pain control difficult.  She had epidural steroid injection 06/12/15 without much relief.  After much discussion on various options, decision was made to take her to OR on 06/16/15 for decompression and fusion of L12, L2-3, L3-4, L4-5 right, posterior lateral arthrodesis from T12 to sacrum.  She was extubated post operatively and PCCM was asked to assist with management.    SUBJECTIVE: c/o  Pain  UO about 350/last 12 h  VITAL SIGNS: Temp:  [97.6 F (36.4 C)-98.3 F (36.8 C)] 97.6 F (36.4 C) (03/08 0800) Pulse Rate:  [83-112] 83 (03/08 0800) Resp:  [7-19] 11 (03/08 0800) BP: (70-111)/(55-83) 83/61 mmHg (03/08 0800) SpO2:  [97 %-100 %] 100 % (03/08 0800) Weight:  [159 lb 9.8 oz (72.4 kg)] 159 lb 9.8 oz (72.4 kg) (03/07 2255)  PHYSICAL EXAMINATION: General: Adult female, resting in bed Neuro: alert, interactive HEENT: [place]/AT. PERRL, sclerae anicteric. Cardiovascular: RRR, no M/R/G.  Lungs: Respirations even and unlabored.  CTA bilaterally, No W/R/R.  Abdomen: BS x 4, soft, NT/ND.  Musculoskeletal: No gross deformities, no edema.  Skin: Intact, warm, no rashes.     Recent Labs Lab 06/12/15 2239 06/17/15 0300  NA  --  137  K  --  4.3  CL  --  105  CO2  --  21*  BUN  --  18  CREATININE 0.55 0.93  GLUCOSE   --  214*    Recent Labs Lab 06/12/15 2239 06/17/15 0300  HGB 13.9 10.1*  HCT 43.0 31.7*  WBC 8.0 15.9*  PLT 312 237   Dg Chest 1 View  06/17/2015  CLINICAL DATA:  70 year old female with dyspnea EXAM: CHEST 1 VIEW COMPARISON:  Prior chest x-ray 03/13/2012 FINDINGS: Stable cardiac contours. Significant widening of the mediastinum compared to prior imaging. Inspiratory volumes are low. There is mild bibasilar atelectasis. Incompletely imaged posterior lumbar fixation hardware. No acute osseous abnormality. IMPRESSION: 1. Significant widening of the mediastinum compared to the prior chest x-ray from December of 2013. This may be artifactual and related to a combination of portable frontal technique and low inspiratory volumes. However, interval development of an ascending aortic aneurysm, mediastinal hematoma, or mediastinal mass is difficult to exclude entirely. Recommend dedicated PA and lateral chest x-ray. If the mediastinum again appears widened, further evaluation with CTA of the chest may be warranted. 2. Low inspiratory volumes with mild bibasilar atelectasis. These results will be called to the ordering clinician or representative by the Radiologist Assistant, and communication documented in the PACS or zVision Dashboard. Electronically Signed   By: Jacqulynn Cadet M.D.   On: 06/17/2015 08:04   Dg Lumbar Spine 2-3 Views  06/16/2015  CLINICAL DATA:  T12-S1 lumbar fusion EXAM: DG C-ARM GT 120 MIN; LUMBAR SPINE - 2-3 VIEW FLUOROSCOPY TIME:  Fluoroscopy Time (in minutes and seconds):  1 minutes 15 seconds COMPARISON:  Lumbar spine CT dated 06/16/2015 FINDINGS: Intraoperative fluoroscopic images during T12-S1 posterior lumbar fusion. Two frontal and three lateral images were obtained, demonstrating surgical hardware in satisfactory position. IMPRESSION: Intraoperative fluoroscopic images, as above. Electronically Signed   By: Julian Hy M.D.   On: 06/16/2015 21:32   Dg Pelvis 1-2  Views  06/15/2015  CLINICAL DATA:  Preoperative examination.  Right hip pain. EXAM: PELVIS - 1-2 VIEW COMPARISON:  None. FINDINGS: There is no evidence of pelvic fracture or diastasis. No pelvic bone lesions are seen. Visualized hips appear intact. IMPRESSION: Negative. Electronically Signed   By: Lucienne Capers M.D.   On: 06/15/2015 21:21   Ct Lumbar Spine Wo Contrast  06/16/2015  CLINICAL DATA:  Initial evaluation for right hip pain. Patient scheduled for spinal surgery tomorrow. EXAM: CT LUMBAR SPINE WITHOUT CONTRAST TECHNIQUE: Multidetector CT imaging of the lumbar spine was performed without intravenous contrast administration. Multiplanar CT image reconstructions were also generated. COMPARISON:  Prior MRI of the lumbar spine performed on 06/15/2015. FINDINGS: Severe dextroscoliosis of the lumbar spine with apex at L3 again seen. Vertebral body heights are maintained without evidence of fracture. Trace retrolisthesis of L2 on L3. No other significant listhesis. Paraspinous soft tissues demonstrate no acute abnormality. Mild fatty atrophy within the posterior paraspinous musculature. Visualized visceral structures within normal limits. 13 mm cyst present within the lower pole left kidney. Cholecystectomy clips noted. Mild scattered atheromatous plaque within the infrarenal aorta. No aneurysm. No retroperitoneal adenopathy. T12-L1:  Mild disc bulge without significant stenosis. L1-2: Degenerative intervertebral disc space narrowing with disc desiccation. Reactive endplate sclerosis with prominent endplate anterior osteophytic spurring, predominantly at the right anterolateral aspect of the L2-3 disc space. Mild bilateral facet arthrosis, slightly worse on the right. Associated right lateral recess stenosis at this level better evaluated on previous MRI. L2-3: Degenerative intervertebral disc space narrowing. Reactive endplate changes, predominantly at the left aspect of the L2-3 disc space. Bulky endplate  osteophytic spurring from the superior endplate of L3, and to a lesser extent the inferior endplate of L2. Disc bulging, asymmetric to the left, better evaluated on prior MRI. Bilateral facet arthrosis, slightly worse on the left. Associated left lateral recess stenosis at this level better evaluated on prior MRI. L3-4: Degenerative intervertebral disc space narrowing with disc desiccation. Reactive endplate changes with endplate osteophytic spurring, predominantly at the left aspect of the L3-4 disc space. Disc bulging, asymmetric to the left, better evaluated on MRI. Left greater than right facet disease. Gas within the epidural space at this level likely degenerative in nature. Associated left lateral recess stenosis at this level better evaluated on prior MRI. L4-5: Degenerative disc bulge with disc desiccation. Moderate to severe right-sided facet disease. More moderate left-sided facet arthrosis. Broad-based disc protrusion better evaluated on prior MRI. Mild to moderate right lateral recess stenosis better seen on previous MRI. Moderate right foraminal narrowing related to bony osteophytic spurring from the right L4-5 S at. L5-S1: Degenerative intervertebral disc space narrowing with reactive endplate changes. Prominent endplate osteophytic spurring at the right aspect of the L5-S1 disc space. Severe facet arthrosis on the right. Resultant moderate right foraminal narrowing P no significant canal stenosis. IMPRESSION: 1. Severe dextroscoliosis with apex at L3. 2. Multilevel degenerative intervertebral disc space narrowing, disc desiccation and reactive endplate sclerosis and osteophytosis, with facet disease as above. There is right-sided lateral recess stenosis at L1-2, L4-5, and L5-S1, with left lateral recess stenosis at L2-3 and L3-4. Moderate right foraminal  stenosis at L4-5 and L5-S1. These stenoses are somewhat better appreciated on recent MRI from 06/15/2015. This study will be used for surgical  planning purposes. Electronically Signed   By: Jeannine Boga M.D.   On: 06/16/2015 03:27   Mr Lumbar Spine Wo Contrast  06/15/2015  CLINICAL DATA:  Severe intractable back pain with right lumbar radiculopathy. Scoliosis. EXAM: MRI LUMBAR SPINE WITHOUT CONTRAST TECHNIQUE: Multiplanar, multisequence MR imaging of the lumbar spine was performed. No intravenous contrast was administered. COMPARISON:  Lumbar spine radiographs 11/12/2014. FINDINGS: Normal signal is present in the conus medullaris which terminates at T12-L1. Severe rightward curvature of the lumbar spine is centered at L2-3. There is asymmetric left-sided endplate marrow change at L2-3 and right-sided endplate marrow change at L1-2. Vertebral body heights maintained. There is slight retrolisthesis at L2-3. AP alignment is otherwise anatomic. Limited imaging of the abdomen demonstrates a benign cyst in the right kidney posteriorly. No other focal lesions are evident. T12-L1:  Negative. L1-2: A broad-based disc protrusion is present. There is mild facet hypertrophy bilaterally. Mild foraminal narrowing is present on the right. L2-3: A broad-based disc protrusion is asymmetric to the left. This results in moderate left subarticular stenosis. Asymmetric left-sided facet hypertrophy contributes. Moderate to severe left foraminal narrowing is present. There is mild right foraminal stenosis. L3-4: A broad-based disc protrusion is asymmetric to the left. Mild to moderate left subarticular narrowing is present. Moderate left and mild right foraminal narrowing is present as well. L4-5: Moderate facet hypertrophy is present bilaterally. There is a broad-based disc protrusion. This results in mild to moderate subarticular and foraminal narrowing bilaterally, right greater than left. L5-S1: Asymmetric moderate facet hypertrophy is present. The central canal is patent. Moderate right foraminal stenosis is present. IMPRESSION: 1. Severe dextro convex scoliosis  is centered at L2-3. 2. Mild foraminal narrowing on the right at L1-2. 3. Moderate left subarticular and moderate to severe left foraminal narrowing at L2-3. 4. Mild to moderate left subarticular stenosis at L3-4. 5. Moderate left and mild right foraminal narrowing at L3-4. 6. Mild to moderate subarticular and foraminal narrowing bilaterally at L4-5 is worse on the right. 7. Moderate right foraminal stenosis at L5-S1. Electronically Signed   By: San Morelle M.D.   On: 06/15/2015 15:32   Dg C-arm Gt 120 Min  06/16/2015  CLINICAL DATA:  T12-S1 lumbar fusion EXAM: DG C-ARM GT 120 MIN; LUMBAR SPINE - 2-3 VIEW FLUOROSCOPY TIME:  Fluoroscopy Time (in minutes and seconds): 1 minutes 15 seconds COMPARISON:  Lumbar spine CT dated 06/16/2015 FINDINGS: Intraoperative fluoroscopic images during T12-S1 posterior lumbar fusion. Two frontal and three lateral images were obtained, demonstrating surgical hardware in satisfactory position. IMPRESSION: Intraoperative fluoroscopic images, as above. Electronically Signed   By: Julian Hy M.D.   On: 06/16/2015 21:32    STUDIES:  Lumbar MRI 03/06 > severe dextro convex scoliosis L2-3.  Mild foraminal narrowing on the right at L1-2.  Mod-severe left foraminal narrowing at L2-3.  Mild - mod left subarticular stenosis at L3-4.  Mod left and mild right foraminal narrowing at L3-4.  Mild - mod subarticular and foraminal narrowing bilaterally L4-5 worse on right.  Mod right foraminal stenosis at L5-S1.  SIGNIFICANT EVENTS  03/03 > admitted with intractable pain. 03/07 > to OR for decompression and fusion of L12, L2-3, L3-4, L4-5 right, posterior lateral arthrodesis from T12 to sacrum.   ASSESSMENT / PLAN:  Progressive degenerative scoliosis with intractable pain - s/p decompression and fusion of L12, L2-3, L3-4,  L4-5 right, posterior lateral arthrodesis from T12 to sacrum 06/16/15. Fibromyalgia. Plan:  Post op care and pain control per neurosurgery. Low dose  fent ok as BP permits Toradol added + vicodin  Esophageal reflux. Plan: Continue protonix, ranitidine.  Hypertension, now soft BP Plan: Monitor - hold off antihypertensives  Oliguria - monitor, expect to pick up Watch with toradol on board  Hyperglycemia - related to steroids SSI  Keep in ICU given all above issues  Kara Mead MD. FCCP. [facility] Pager [phone] If no response call [phone]      06/17/2015, 9:25 AM

## 2015-06-17 NOTE — Progress Notes (Signed)
Dr Elsworth Soho notified of lactic acid of 3.8. He stated we would recheck it again later, no new orders received.

## 2015-06-17 NOTE — Progress Notes (Signed)
Spoke with Dr Ellene Route confirming ok to give prn fentanyl. Pt received fentanyl prior, during, and after surgery and no suspected allergic reaction noted. Per MD Elsner, ok to give fentanyl. Will administer and continue to monitor.

## 2015-06-17 NOTE — Care Management Note (Signed)
Case Management Note  Patient Details  Name: EZRA EKE MRN: [id number] Date of Birth: 12/11/1945  Subjective/Objective:   Pt admitted to 40M s/p decompression and fusion of L12, L2-3, L3-4, L4-5 right, posterior lateral arthrodesis from T12 to sacrum.  PTA, pt independent, lives with spouse, who is a Engineer, drilling.     Action/Plan: PT/OT recommending CIR consult.  Will obtain consult order.    Expected Discharge Date:                  Expected Discharge Plan:   IP Rehab  In-House Referral:     Discharge planning Services    CM Referral Post Acute Care Choice:    Choice offered to:     DME Arranged:    DME Agency:     HH Arranged:    HH Agency:     Status of Service:  In process, will continue to follow  Medicare Important Message Given:    Date Medicare IM Given:    Medicare IM give by:    Date Additional Medicare IM Given:    Additional Medicare Important Message give by:     If discussed at [place] of Stay Meetings, dates discussed:    Additional Comments:  Reinaldo Raddle, RN, BSN  Trauma/Neuro ICU Case Manager [phone]

## 2015-06-18 LAB — GLUCOSE, CAPILLARY
Glucose-Capillary: 111 mg/dL — ABNORMAL HIGH (ref 65–99)
Glucose-Capillary: 119 mg/dL — ABNORMAL HIGH (ref 65–99)
Glucose-Capillary: 138 mg/dL — ABNORMAL HIGH (ref 65–99)
Glucose-Capillary: 152 mg/dL — ABNORMAL HIGH (ref 65–99)

## 2015-06-18 MED ORDER — MAGNESIUM SULFATE 2 GM/50ML IV SOLN
2.0000 g | Freq: Once | INTRAVENOUS | Status: AC
  Administered 2015-06-18: 2 g via INTRAVENOUS
  Filled 2015-06-18: qty 50

## 2015-06-18 MED FILL — Lactated Ringer's Solution: INTRAVENOUS | Qty: 1000 | Status: AC

## 2015-06-18 MED FILL — Propofol IV Emul 200 MG/20ML (10 MG/ML): INTRAVENOUS | Qty: 20 | Status: AC

## 2015-06-18 MED FILL — Phenylephrine-NaCl Pref Syr 0.4 MG/10ML-0.9% (40 MCG/ML): INTRAVENOUS | Qty: 10 | Status: AC

## 2015-06-18 MED FILL — Lidocaine HCl IV Inj 20 MG/ML: INTRAVENOUS | Qty: 5 | Status: AC

## 2015-06-18 NOTE — Progress Notes (Signed)
Physical Therapy Treatment Patient Details Name: Rita Gregory MRN: [id number] DOB: March 11, 1946 Today's Date: 06/18/2015    History of Present Illness This 70 y.o. female admitted for intractable back, hip and leg pain.  She underwent Anterolateral decompression and fusion L1-2, L2-3, L3-4, x lift spacer.  Stage II posterior decompression or Rt sided L4 and L5 nerve roots with transforaminal interbody fusion L4-5 on Rt.  Segmental fixation from T12 to ilium with pedicle screws and rods, posterior lateral arthodesis from T12 to sacram with local autograft.  PMH includes:  degenerative scoliosis lumbar spine, diverticulosis of colon, fibromyalgia, HTN, PFO, chronic pain, migraine, parozysmal spells, s/p appendectomy, cholycystectomy, Rt TKA '05, Rt rotator cuff repair     PT Comments    Pt able to transfer to recliner today with 2 person A.  Pt's BP remain soft and ranged from 100/66 to 76/50 during session with pt denying symptoms.  RN aware of BPs and SCDs applied while pt sitting in chair.  Continue to feel pt will need continued therapies at D/C to maximize independence and decrease burden of care.  Will continue to follow.    Follow Up Recommendations  CIR     Equipment Recommendations  None recommended by PT    Recommendations for Other Services       Precautions / Restrictions Precautions Precautions: Fall;Back Precaution Booklet Issued: No Precaution Comments: instructed pt in back precautions  Restrictions Weight Bearing Restrictions: No    Mobility  Bed Mobility Overal bed mobility: Needs Assistance Bed Mobility: Rolling;Sidelying to Sit Rolling: Mod assist Sidelying to sit: Mod assist;+2 for physical assistance       General bed mobility comments: cues for step-by-step through log roll technique.  pt needed A for each aspect of bed mobility.    Transfers Overall transfer level: Needs assistance Equipment used: 2 person hand held assist Transfers: Sit to/from  [facility] Sit to Stand: Mod assist;+2 physical assistance Stand pivot transfers: Mod assist;+2 physical assistance       General transfer comment: cues for safe use of UEs and movement through pivot to chair.  pt does A with power up to standing and taking pivotal steps.    Ambulation/Gait                 Stairs            Wheelchair Mobility    Modified Rankin (Stroke Patients Only)       Balance Overall balance assessment: Needs assistance Sitting-balance support: Bilateral upper extremity supported;Feet supported Sitting balance-Leahy Scale: Poor Sitting balance - Comments: pt requires MinG to [facility] to maintain balance and minimally uses her UEs to A.  pt leans posteriorly despite cueing. Postural control: Posterior lean Standing balance support: During functional activity Standing balance-Leahy Scale: Poor                      Cognition Arousal/Alertness: Lethargic;Suspect due to medications Behavior During Therapy: Flat affect Overall Cognitive Status: Impaired/Different from baseline (Likely due to meds.  Delayed responses.)                      Exercises      General Comments General comments (skin integrity, edema, etc.): BP supine 100/63, Sitting EOB 100/66, after sitting 4 mins 99/58, after transfer to recliner 84/55, after sitting in recliner 31mins 86/57, after sitting an additional 3 mins 82/55, after sitting an additional 3 mins 75/57, after sitting another 3 mins and  applying SCDs 76/50.        Pertinent Vitals/Pain Pain Assessment: 0-10 Pain Score: 8  Pain Location: Sciatic pain Pain Descriptors / Indicators: Burning Pain Intervention(s): Monitored during session;Premedicated before session;Repositioned    Home Living                      Prior Function            PT Goals (current goals can now be found in the care plan section) Acute Rehab PT Goals Patient Stated Goal: To regain  independence  PT Goal Formulation: With patient/family Time For Goal Achievement: 07/01/15 Potential to Achieve Goals: Good Progress towards PT goals: Progressing toward goals    Frequency  Min 5X/week    PT Plan Current plan remains appropriate    Co-evaluation             End of Session Equipment Utilized During Treatment: Gait belt;Back brace;Oxygen Activity Tolerance: Patient limited by lethargy Patient left: in chair;with call bell/phone within reach;with family/visitor present     Time: [id number] PT Time Calculation (min) (ACUTE ONLY): 43 min  Charges:  $Therapeutic Activity: 38-52 mins                    G CodesCatarina Hartshorn, [place] 06/18/2015, 3:05 PM

## 2015-06-18 NOTE — Progress Notes (Signed)
I met with pt and her spouse at bedside. I discussed the need for pt to demonstrate tolerance for more intense therapies before I can pursue an inpt rehab admission such as increased time sitting in chair for an hour or two twice per day, ability to feed self, decreased use of IV pain meds. Spouse plans to hire 24/7 assist when he is working to provide her 24/7 physical assistance at home. I also discussed SNF rehab option if pt unable to tolerate more intense therapies. I will alert SW of need to discuss SNF rehab and spouse is in agreement. I will follow up tomorrow. [phone]

## 2015-06-18 NOTE — Progress Notes (Signed)
Name: Rita Gregory MRN: [id number] DOB: 06/15/1945    ADMISSION DATE:  06/12/2015 CONSULTATION DATE:  06/16/15  REFERRING MD :  Ellene Route  CHIEF COMPLAINT:  Post op management   HISTORY OF PRESENT ILLNESS:   Rita Gregory is a 70 y.o. female with a PMH as outlined below including progressive degenerative scoliosis for which she has been followed by Dr. Ellene Route for several years now.  She had been having progressive decline and deterioration in her level of function and in the past week or 2, had intractable pain.  Of note, she has multiple allergies to various narcotics making pain control difficult.  She had epidural steroid injection 06/12/15 without much relief.  After much discussion on various options, decision was made to take her to OR on 06/16/15 for decompression and fusion of L12, L2-3, L3-4, L4-5 right, posterior lateral arthrodesis from T12 to sacrum.  She was extubated post operatively and PCCM was asked to assist with management.    SUBJECTIVE:  Pain improved oob to chair with PT Good UO afebrile  VITAL SIGNS: Temp:  [97.1 F (36.2 C)-98.4 F (36.9 C)] 97.9 F (36.6 C) (03/09 0400) Pulse Rate:  [78-94] 84 (03/09 1000) Resp:  [8-17] 11 (03/09 1000) BP: (72-110)/(46-75) 97/58 mmHg (03/09 1000) SpO2:  [87 %-100 %] 98 % (03/09 1000)  PHYSICAL EXAMINATION: General: Adult female,to chair Neuro: alert, interactive HEENT: [place]/AT. PERRL, sclerae anicteric. Cardiovascular: RRR, no M/R/G.  Lungs: Respirations even and unlabored.  CTA bilaterally, No W/R/R.  Abdomen: BS x 4, soft, NT/ND.  Musculoskeletal: No gross deformities, no edema.  Skin: Intact, warm, no rashes.     Recent Labs Lab 06/12/15 2239 06/17/15 0300  NA  --  137  K  --  4.3  CL  --  105  CO2  --  21*  BUN  --  18  CREATININE 0.55 0.93  GLUCOSE  --  214*    Recent Labs Lab 06/12/15 2239 06/17/15 0300  HGB 13.9 10.1*  HCT 43.0 31.7*  WBC 8.0 15.9*  PLT 312 237   Dg Chest 1  View  06/17/2015  CLINICAL DATA:  70 year old female with dyspnea EXAM: CHEST 1 VIEW COMPARISON:  Prior chest x-ray 03/13/2012 FINDINGS: Stable cardiac contours. Significant widening of the mediastinum compared to prior imaging. Inspiratory volumes are low. There is mild bibasilar atelectasis. Incompletely imaged posterior lumbar fixation hardware. No acute osseous abnormality. IMPRESSION: 1. Significant widening of the mediastinum compared to the prior chest x-ray from December of 2013. This may be artifactual and related to a combination of portable frontal technique and low inspiratory volumes. However, interval development of an ascending aortic aneurysm, mediastinal hematoma, or mediastinal mass is difficult to exclude entirely. Recommend dedicated PA and lateral chest x-ray. If the mediastinum again appears widened, further evaluation with CTA of the chest may be warranted. 2. Low inspiratory volumes with mild bibasilar atelectasis. These results will be called to the ordering clinician or representative by the Radiologist Assistant, and communication documented in the PACS or zVision Dashboard. Electronically Signed   By: Jacqulynn Cadet M.D.   On: 06/17/2015 08:04   Dg Lumbar Spine 2-3 Views  06/16/2015  CLINICAL DATA:  T12-S1 lumbar fusion EXAM: DG C-ARM GT 120 MIN; LUMBAR SPINE - 2-3 VIEW FLUOROSCOPY TIME:  Fluoroscopy Time (in minutes and seconds): 1 minutes 15 seconds COMPARISON:  Lumbar spine CT dated 06/16/2015 FINDINGS: Intraoperative fluoroscopic images during T12-S1 posterior lumbar fusion. Two frontal and three lateral images were obtained, demonstrating  surgical hardware in satisfactory position. IMPRESSION: Intraoperative fluoroscopic images, as above. Electronically Signed   By: Julian Hy M.D.   On: 06/16/2015 21:32   Dg C-arm Gt 120 Min  06/16/2015  CLINICAL DATA:  T12-S1 lumbar fusion EXAM: DG C-ARM GT 120 MIN; LUMBAR SPINE - 2-3 VIEW FLUOROSCOPY TIME:  Fluoroscopy Time (in  minutes and seconds): 1 minutes 15 seconds COMPARISON:  Lumbar spine CT dated 06/16/2015 FINDINGS: Intraoperative fluoroscopic images during T12-S1 posterior lumbar fusion. Two frontal and three lateral images were obtained, demonstrating surgical hardware in satisfactory position. IMPRESSION: Intraoperative fluoroscopic images, as above. Electronically Signed   By: Julian Hy M.D.   On: 06/16/2015 21:32    STUDIES:  Lumbar MRI 03/06 > severe dextro convex scoliosis L2-3.  Mild foraminal narrowing on the right at L1-2.  Mod-severe left foraminal narrowing at L2-3.  Mild - mod left subarticular stenosis at L3-4.  Mod left and mild right foraminal narrowing at L3-4.  Mild - mod subarticular and foraminal narrowing bilaterally L4-5 worse on right.  Mod right foraminal stenosis at L5-S1.  SIGNIFICANT EVENTS  03/03 > admitted with intractable pain. 03/07 > to OR for decompression and fusion of L12, L2-3, L3-4, L4-5 right, posterior lateral arthrodesis from T12 to sacrum.   ASSESSMENT / PLAN:  Progressive degenerative scoliosis with intractable pain - s/p decompression and fusion of L12, L2-3, L3-4, L4-5 right, posterior lateral arthrodesis from T12 to sacrum 06/16/15. Fibromyalgia. Plan:  Post op care and pain control per neurosurgery. Low dose fent ok as BP permits Toradol added + vicodin  Esophageal reflux. Plan: Continue protonix, ranitidine.  Hypertension, now soft BP Plan: Monitor - hold off antihypertensives  Oliguria - resolved Watch BMET with toradol on board  Hyperglycemia - related to steroids Dc SSI - d/w husband  Hypomag- replete  OK to transfer to floor, will ask [facility] to check tomorrow  Kara Mead MD. Shade Flood. [facility] Pager [phone] If no response call [phone]      06/18/2015, 11:07 AM

## 2015-06-18 NOTE — Progress Notes (Signed)
Patient ID: Rita Gregory, female   DOB: [birth year], 70 y.o.   MRN: [id number] Patient has been having some right radicular pain. Back pain managed fairly with fentanyl andhydrocodone Needs mobilization Transfer to floor today Will ask hospitalist to help manage medical issues.

## 2015-06-19 LAB — CBC WITH DIFFERENTIAL/PLATELET
Basophils Absolute: 0 10*3/uL (ref 0.0–0.1)
Basophils Relative: 0 %
Eosinophils Absolute: 0 10*3/uL (ref 0.0–0.7)
Eosinophils Relative: 0 %
HCT: 19.1 % — ABNORMAL LOW (ref 36.0–46.0)
Hemoglobin: 6.2 g/dL — CL (ref 12.0–15.0)
Lymphocytes Relative: 13 %
Lymphs Abs: 1.5 10*3/uL (ref 0.7–4.0)
MCH: 29.4 pg (ref 26.0–34.0)
MCHC: 32.5 g/dL (ref 30.0–36.0)
MCV: 90.5 fL (ref 78.0–100.0)
Monocytes Absolute: 0.9 10*3/uL (ref 0.1–1.0)
Monocytes Relative: 8 %
Neutro Abs: 9.1 10*3/uL — ABNORMAL HIGH (ref 1.7–7.7)
Neutrophils Relative %: 79 %
Platelets: 183 10*3/uL (ref 150–400)
RBC: 2.11 MIL/uL — ABNORMAL LOW (ref 3.87–5.11)
RDW: 14.6 % (ref 11.5–15.5)
WBC: 11.5 10*3/uL — ABNORMAL HIGH (ref 4.0–10.5)

## 2015-06-19 LAB — PHOSPHORUS: Phosphorus: 1.6 mg/dL — ABNORMAL LOW (ref 2.5–4.6)

## 2015-06-19 LAB — IRON AND TIBC
Iron: 14 ug/dL — ABNORMAL LOW (ref 28–170)
Saturation Ratios: 6 % — ABNORMAL LOW (ref 10.4–31.8)
TIBC: 246 ug/dL — ABNORMAL LOW (ref 250–450)
UIBC: 232 ug/dL

## 2015-06-19 LAB — BASIC METABOLIC PANEL
Anion gap: 7 (ref 5–15)
BUN: 17 mg/dL (ref 6–20)
CO2: 24 mmol/L (ref 22–32)
Calcium: 7.8 mg/dL — ABNORMAL LOW (ref 8.9–10.3)
Chloride: 109 mmol/L (ref 101–111)
Creatinine, Ser: 0.65 mg/dL (ref 0.44–1.00)
GFR calc Af Amer: >60 mL/min (ref >60)
GFR calc non Af Amer: >60 mL/min (ref >60)
Glucose, Bld: 132 mg/dL — ABNORMAL HIGH (ref 65–99)
Potassium: 5 mmol/L (ref 3.5–5.1)
Sodium: 140 mmol/L (ref 135–145)

## 2015-06-19 LAB — FERRITIN: Ferritin: 96 ng/mL (ref 11–307)

## 2015-06-19 LAB — CBC
HCT: 25.8 % — ABNORMAL LOW (ref 36.0–46.0)
Hemoglobin: 8.2 g/dL — ABNORMAL LOW (ref 12.0–15.0)
MCH: 29 pg (ref 26.0–34.0)
MCHC: 31.8 g/dL (ref 30.0–36.0)
MCV: 91.2 fL (ref 78.0–100.0)
Platelets: 210 10*3/uL (ref 150–400)
RBC: 2.83 MIL/uL — ABNORMAL LOW (ref 3.87–5.11)
RDW: 14.5 % (ref 11.5–15.5)
WBC: 12.4 10*3/uL — ABNORMAL HIGH (ref 4.0–10.5)

## 2015-06-19 LAB — MAGNESIUM: Magnesium: 2.4 mg/dL (ref 1.7–2.4)

## 2015-06-19 LAB — PREPARE RBC (CROSSMATCH)

## 2015-06-19 MED ORDER — SODIUM CHLORIDE 0.9 % IV SOLN
Freq: Once | INTRAVENOUS | Status: AC
  Administered 2015-06-19: 07:00:00 via INTRAVENOUS

## 2015-06-19 MED ORDER — DEXAMETHASONE 2 MG PO TABS
2.0000 mg | ORAL_TABLET | Freq: Two times a day (BID) | ORAL | Status: DC
  Administered 2015-06-19 – 2015-06-22 (×7): 2 mg via ORAL
  Filled 2015-06-19 (×7): qty 1

## 2015-06-19 MED ORDER — TRAZODONE HCL 50 MG PO TABS
50.0000 mg | ORAL_TABLET | Freq: Every day | ORAL | Status: DC
  Administered 2015-06-19 – 2015-06-21 (×3): 50 mg via ORAL
  Filled 2015-06-19 (×3): qty 1

## 2015-06-19 NOTE — Progress Notes (Signed)
[facility] PROGRESS NOTE  Rita Gregory [id number] DOB: [birth year] DOA: 06/12/2015 PCP: Limmie Patricia, MD  Brief narrative 70 year old female with history of hypertension, migraine, colonic diverticulosis, GERD, progressive degenerative lumbar scoliosis with significant functional decline and intractable pain without relief with pain medications (has allergies to multiple narcotics) and epidural injection was taken to or on 06/16/2015 for anterior decompression fusion of  L1-L4 and posterior decompression of right L4-L5, lateral arthrodesis from T12-sacrum. Patient was extubated postop and PC CM and was assisting with management. Transferred to medical floor and hospitalist assisting with medical management.  Assessment/Plan: Progressive lumbar degenerative scoliosis with intractable pain Status post anterior decompression of L1-L4 and posterior and posterior decompression of right-sided L4 and L5 with segmental fixation from T12-ileum, posterior lateral arthrodesis from t12-sacrum. Pain control per neurosurgery. Getting Vicodin. Toradol added postoperatively given her acute anemia I have held it.  Acute anemia Possibly operative blood loss. Significant drop in H&H.( 6.2>>10.1)? Lab error. Received 1 unit PRBC. Check repeat H&H. Iron panel suggest iron deficiency. Order stool for occult blood. Avoid NSAIDs. Continue PPI.   GERD Continue Protonix and Pepcid.  Hypertension with soft blood pressure Monitoring of antibiotics.  Hyperglycemia Related to steroid. Continue to monitor.  DVT prophylaxis: SCDs. Hold Lovenox given her acute anemia.  Diet: Regular  Code Status: Full code Family Communication: None at bedside Disposition Plan: CIR consulted. Per primary team. If Hemoglobin stable, cleared medically for discharge    HPI/Subjective: Seen and examined. Complains of low back pain. Noted for significant drop in hemoglobin this morning.  Objective: Filed Vitals:    06/19/15 1130 06/19/15 1300  BP: 116/68 128/72  Pulse: 78 92  Temp: 98 F (36.7 C) 98 F (36.7 C)  Resp: 16 16    Intake/Output Summary (Last 24 hours) at 06/19/15 1432 Last data filed at 06/19/15 0820  Gross per 24 hour  Intake      0 ml  Output    975 ml  Net   -975 ml   Filed Weights   06/15/15 1300 06/16/15 2255  Weight: 80 kg (176 lb 5.9 oz) 72.4 kg (159 lb 9.8 oz)    Exam:   General: Elderly thin built female not in distress  HEENT: Pallor present, moist mucosa  Chest: Clear bilaterally  CVS: Normal S1, no murmurs rub or gallop  GI: Soft, nondistended, nontender, bowel sounds present  Musculoskeletal: Limited mobility pain, warm, no edema  CNS: Alert and oriented   Data Reviewed: Basic Metabolic Panel:  Recent Labs Lab 06/12/15 2239 06/17/15 0300 06/17/15 0839 06/19/15 0550  NA  --  137  --  140  K  --  4.3  --  5.0  CL  --  105  --  109  CO2  --  21*  --  24  GLUCOSE  --  214*  --  132*  BUN  --  18  --  17  CREATININE 0.55 0.93  --  0.65  CALCIUM  --  7.7*  --  7.8*  MG  --   --  1.4* 2.4  PHOS  --   --  5.0* 1.6*   Liver Function Tests: No results for input(s): AST, ALT, ALKPHOS, BILITOT, PROT, ALBUMIN in the last 168 hours. No results for input(s): LIPASE, AMYLASE in the last 168 hours. No results for input(s): AMMONIA in the last 168 hours. CBC:  Recent Labs Lab 06/12/15 2239 06/17/15 0300 06/19/15 0550  WBC 8.0 15.9* 11.5*  NEUTROABS  --   --  9.1*  HGB 13.9 10.1* 6.2*  HCT 43.0 31.7* 19.1*  MCV 87.6 90.6 90.5  PLT 312 237 183   Cardiac Enzymes: No results for input(s): CKTOTAL, CKMB, CKMBINDEX, TROPONINI in the last 168 hours. BNP (last 3 results) No results for input(s): BNP in the last 8760 hours.  ProBNP (last 3 results) No results for input(s): PROBNP in the last 8760 hours.  CBG:  Recent Labs Lab 06/17/15 2206 06/18/15 0824 06/18/15 1140 06/18/15 2147 06/18/15 2149  GLUCAP 121* 111* 119* 152* 138*     Recent Results (from the past 240 hour(s))  Surgical pcr screen     Status: None   Collection Time: 06/15/15  9:04 PM  Result Value Ref Range Status   MRSA, PCR NEGATIVE NEGATIVE Final   Staphylococcus aureus NEGATIVE NEGATIVE Final    Comment:        The Xpert SA Assay (FDA approved for NASAL specimens in patients over 36 years of age), is one component of a comprehensive surveillance program.  Test performance has been validated by [facility] for patients greater than or equal to 40 year old. It is not intended to diagnose infection nor to guide or monitor treatment.      Studies: No results found.  Scheduled Meds: . antiseptic oral rinse  7 mL Mouth Rinse BID  . budesonide (PULMICORT) nebulizer solution  0.5 mg Nebulization BID  . cycloSPORINE  1 drop Both Eyes BID  . dexamethasone  2 mg Oral Q12H  . docusate sodium  100 mg Oral BID  . enoxaparin (LOVENOX) injection  40 mg Subcutaneous Q24H  . escitalopram  10 mg Oral BID  . estrogens (conjugated)  0.3 mg Oral Daily  . famotidine  20 mg Oral QHS  . fenofibrate  54 mg Oral Daily  . gabapentin  100 mg Oral BID  . ketoconazole  1 application Topical BID  . levothyroxine  25 mcg Oral QHS  . pantoprazole  20 mg Oral Daily  . pravastatin  40 mg Oral QHS  . pyridOXINE  200 mg Oral QODAY  . senna  1 tablet Oral BID  . sodium chloride flush  3 mL Intravenous Q12H   Continuous Infusions: . sodium chloride         Time spent: 25 minutes    [name], [place]  [facility] Pager [phone] If 7PM-7AM, please contact night-coverage at [email or web address], password [facility] 06/19/2015, 2:32 PM  LOS: 7 days

## 2015-06-19 NOTE — Progress Notes (Addendum)
Received call from lab Hg 44f 6.2. NP paged with new order. Report to oncoming for RBC transfusion.

## 2015-06-19 NOTE — Progress Notes (Signed)
Noted therapy progress . I will follow up on Monday to assist in determining CIR vs SNF rehab venue. [id number]

## 2015-06-19 NOTE — Progress Notes (Signed)
Occupational Therapy Treatment Patient Details Name: Rita Gregory MRN: [id number] DOB: 1945-07-13 Today's Date: 06/19/2015    History of present illness This 70 y.o. female admitted for intractable back, hip and leg pain.  She underwent Anterolateral decompression and fusion L1-2, L2-3, L3-4, x lift spacer.  Stage II posterior decompression or Rt sided L4 and L5 nerve roots with transforaminal interbody fusion L4-5 on Rt.  Segmental fixation from T12 to ilium with pedicle screws and rods, posterior lateral arthodesis from T12 to sacram with local autograft.  PMH includes:  degenerative scoliosis lumbar spine, diverticulosis of colon, fibromyalgia, HTN, PFO, chronic pain, migraine, parozysmal spells, s/p appendectomy, cholycystectomy, Rt TKA '05, Rt rotator cuff repair    OT comments  Pt reports just back to bed after sitting up in chair x 90 mins.  Pt performed manually resisted bil. UE exercises.  She requires mod encouragement.  Spouse states pt fed self ~40% of meal. Encouraged pt to perform AROM bil. UEs 2-3 reps each commercial break to increase strength   Follow Up Recommendations  CIR;Supervision/Assistance - 24 hour    Equipment Recommendations  None recommended by OT    Recommendations for Other Services      Precautions / Restrictions Precautions Precautions: Fall;Back Precaution Comments: reinforced back precautions        Mobility Bed Mobility                  Transfers                      Balance                                   ADL Overall ADL's : Needs assistance/impaired Eating/Feeding: Maximal assistance Eating/Feeding Details (indicate cue type and reason): Pt and spouse report she fed herself ~40% of meal today.                                           Vision                     Perception     Praxis      Cognition   Behavior During Therapy: [facility] for tasks assessed/performed Overall  Cognitive Status: Impaired/Different from baseline (delayed responses)                       Extremity/Trunk Assessment               Exercises Other Exercises Other Exercises: Pt performed manually resisted shoulder flex/ext, abd/add, elbow flex/ext bil. x 10 with minimal resistance provided.  Pt requires mod cues to complete.  Strength grossly 4/5 bil.    Shoulder Instructions       General Comments      Pertinent Vitals/ Pain       Pain Assessment: 0-10 Pain Score: 7  Pain Location: back  Pain Descriptors / Indicators: Aching;Grimacing Pain Intervention(s): Monitored during session;Repositioned  Home Living                                          Prior Functioning/Environment  Frequency Min 2X/week     Progress Toward Goals  OT Goals(current goals can now be found in the care plan section)  Progress towards OT goals: Progressing toward goals  ADL Goals Pt Will Perform Eating: with modified independence;sitting Pt Will Perform Grooming: with modified independence;sitting Pt Will Perform Upper Body Bathing: with supervision;sitting Pt Will Perform Lower Body Bathing: with min assist;with adaptive equipment;sit to/from stand Pt Will Perform Upper Body Dressing: with supervision;sitting Pt Will Perform Lower Body Dressing: with min assist;with adaptive equipment;sit to/from stand Pt Will Transfer to Toilet: with min assist;ambulating;regular height toilet;bedside commode;grab bars Pt Will Perform Toileting - Clothing Manipulation and hygiene: with min assist;sit to/from stand  Plan Discharge plan remains appropriate    Co-evaluation                 End of Session     Activity Tolerance Patient tolerated treatment well   Patient Left in bed;with call bell/phone within reach;with family/visitor present   Nurse Communication Mobility status        Time: [id number] OT Time Calculation (min): 26  min  Charges: OT General Charges $OT Visit: 1 Procedure OT Treatments $Therapeutic Exercise: 23-37 mins  Selam Pietsch M 06/19/2015, 2:10 PM

## 2015-06-19 NOTE — Anesthesia Postprocedure Evaluation (Signed)
Anesthesia Post Note  Patient: Rita Gregory  Procedure(s) Performed: Procedure(s) (LRB): ANTERIOR LATERAL LUMBAR FUSION THORACIC TWELVE-LUMBAR FOUR (Left) POSTERIOR LUMBAR FUSION LUMBAR FOUR-FIVE LUMBAR FIVE-SACRAL ONE (N/A)  Patient location during evaluation: PACU Anesthesia Type: General Level of consciousness: awake and alert and patient cooperative Pain management: pain level controlled Vital Signs Assessment: post-procedure vital signs reviewed and stable Respiratory status: spontaneous breathing and respiratory function stable Cardiovascular status: stable Anesthetic complications: no    Last Vitals:  Filed Vitals:   06/19/15 1300 06/19/15 1742  BP: 128/72 130/71  Pulse: 92 68  Temp: 36.7 C 36.6 C  Resp: 16 18    Last Pain:  Filed Vitals:   06/19/15 1742  PainSc: [place]

## 2015-06-19 NOTE — Progress Notes (Signed)
Pt given 50 mcg fentanyl for severe pain. Vitals stable and documented, MD notified. Pt expressed slight relief, from 8 to 7/10.

## 2015-06-19 NOTE — Progress Notes (Signed)
Physical Therapy Treatment Patient Details Name: Rita Gregory MRN: [id number] DOB: 07-Apr-1946 Today's Date: 06/19/2015    History of Present Illness This 70 y.o. female admitted for intractable back, hip and leg pain.  She underwent Anterolateral decompression and fusion L1-2, L2-3, L3-4, x lift spacer.  Stage II posterior decompression or Rt sided L4 and L5 nerve roots with transforaminal interbody fusion L4-5 on Rt.  Segmental fixation from T12 to ilium with pedicle screws and rods, posterior lateral arthodesis from T12 to sacram with local autograft.  PMH includes:  degenerative scoliosis lumbar spine, diverticulosis of colon, fibromyalgia, HTN, PFO, chronic pain, migraine, parozysmal spells, s/p appendectomy, cholycystectomy, Rt TKA '05, Rt rotator cuff repair     PT Comments    Pt is progressing slowly with mobility.  She has significant weakness in bil legs and pain limiting her ability to move better at this time.  Pt continues to be appropriate for CIR level therapies at discharge.   Follow Up Recommendations  CIR     Equipment Recommendations  None recommended by PT    Recommendations for Other Services   NA     Precautions / Restrictions Precautions Precautions: Fall;Back Precaution Comments: reinforced log roll technique.  Required Braces or Orthoses: Spinal Brace Spinal Brace: Applied in sitting position;Other (comment) Spinal Brace Comments: spinal brace in room, no orders for one, donned EOB    Mobility  Bed Mobility Overal bed mobility: Needs Assistance Bed Mobility: Rolling;Sidelying to Sit Rolling: Mod assist Sidelying to sit: Mod assist       General bed mobility comments: Mod assist to support pelvis and position knees in flexion to roll, mod assist to support trunk during transition to sit EOB.   Transfers Overall transfer level: Needs assistance Equipment used: Rolling walker (2 wheeled) Transfers: Sit to/from [facility] Sit to  Stand: +2 physical assistance;Mod assist Stand pivot transfers: +2 physical assistance;Mod assist       General transfer comment: Two person mod assist to support trunk to get to standing EOB.  Asssit needed to support trunk during pivotal steps with RW to recliner chair.  Posterior lean preference throughout, but exaggerated when backing up to chair.   Ambulation/Gait         Gait velocity: pivotal steps only to recliner chair.  Pt with flexed knee buckling posture even with support of RW.  Pt is not safe to walk further at this time.               Balance Overall balance assessment: Needs assistance Sitting-balance support: Feet supported;Bilateral upper extremity supported Sitting balance-Leahy Scale: Poor Sitting balance - Comments: min guard to supervision in sitting due to posterior preference Postural control: Posterior lean Standing balance support: Bilateral upper extremity supported Standing balance-Leahy Scale: Poor                      Cognition Arousal/Alertness: Awake/alert Behavior During Therapy: WFL for tasks assessed/performed Overall Cognitive Status: Within Functional Limits for tasks assessed                      Exercises Other Exercises Other Exercises: Pt performed manually resisted shoulder flex/ext, abd/add, elbow flex/ext bil. x 10 with minimal resistance provided.  Pt requires mod cues to complete.  Strength grossly 4/5 bil.     General Comments General comments (skin integrity, edema, etc.): BPs better today after receiving one unit of blood.  Pt is asymptomatic.  Pertinent Vitals/Pain Pain Assessment: 0-10 Pain Score: 7  Pain Location: back Pain Descriptors / Indicators: Aching;Burning Pain Intervention(s): Limited activity within patient's tolerance;Monitored during session;Repositioned           PT Goals (current goals can now be found in the care plan section) Acute Rehab PT Goals Patient Stated Goal: To  regain independence  Progress towards PT goals: Progressing toward goals    Frequency  Min 5X/week    PT Plan Current plan remains appropriate       End of Session Equipment Utilized During Treatment: Back brace Activity Tolerance: Patient limited by pain Patient left: in chair;with call bell/phone within reach;with family/visitor present     Time: [id number] PT Time Calculation (min) (ACUTE ONLY): 24 min  Charges:  $Therapeutic Activity: 23-37 mins                      Onyx Edgley B. [place], [place], DPT [phone]   06/19/2015, 3:43 PM

## 2015-06-19 NOTE — Progress Notes (Signed)
Patient ID: Rita Gregory, female   DOB: Dec 24, 1945, 70 y.o.   MRN: [id number] Vital signs are stable although blood pressure still remains a bit soft last blood pressure is 128/72. Heart rate remains under 100. Patient is status post 1 unit transfusion of PRBCs and post transfusion hemoglobin is 8.2. Function wise she seems to be doing reasonably well. I like to get a standing x-ray and I advised that we will do this tomorrow. At this point she isn't sitting for brief periods of time but she has not ambulated to any significant degree. She is weaning from the IV pain medications and has only had one dose of fentanyl IV today. Progress is slow but I'm hopeful that she will be ready for rehabilitation by the beginning of the week.

## 2015-06-20 ENCOUNTER — Inpatient Hospital Stay (HOSPITAL_COMMUNITY): Payer: 59

## 2015-06-20 LAB — TYPE AND SCREEN
ABO/RH(D): O POS
Antibody Screen: NEGATIVE
Unit division: 0

## 2015-06-20 LAB — URINALYSIS, ROUTINE W REFLEX MICROSCOPIC
Bilirubin Urine: NEGATIVE
Glucose, UA: NEGATIVE mg/dL
Hgb urine dipstick: NEGATIVE
Ketones, ur: NEGATIVE mg/dL
Leukocytes, UA: NEGATIVE
Nitrite: NEGATIVE
Protein, ur: NEGATIVE mg/dL
Specific Gravity, Urine: 1.008 (ref 1.005–1.030)
pH: 7.5 (ref 5.0–8.0)

## 2015-06-20 MED ORDER — OXYCODONE HCL 5 MG PO TABS
10.0000 mg | ORAL_TABLET | ORAL | Status: DC | PRN
  Administered 2015-06-20 – 2015-06-22 (×12): 10 mg via ORAL
  Filled 2015-06-20 (×13): qty 2

## 2015-06-20 MED ORDER — ENOXAPARIN SODIUM 40 MG/0.4ML ~~LOC~~ SOLN
40.0000 mg | SUBCUTANEOUS | Status: DC
  Administered 2015-06-20 – 2015-06-22 (×3): 40 mg via SUBCUTANEOUS
  Filled 2015-06-20 (×3): qty 0.4

## 2015-06-20 MED ORDER — FERROUS GLUCONATE 324 (38 FE) MG PO TABS
324.0000 mg | ORAL_TABLET | Freq: Two times a day (BID) | ORAL | Status: DC
  Administered 2015-06-20 – 2015-06-22 (×4): 324 mg via ORAL
  Filled 2015-06-20 (×5): qty 1

## 2015-06-20 NOTE — Progress Notes (Signed)
Resident complaining of frequently in urination , requesting a UA. Resident voiding smal amouts and PVR greater than 500cc. I and O cath done and 480cc urine obtained and sample sent for UA/Cand S

## 2015-06-20 NOTE — Progress Notes (Signed)
[facility] PROGRESS NOTE  TRINITEY RAPOZO [id number] DOB: 1945-09-10 DOA: 06/12/2015 PCP: Limmie Patricia, MD  Brief narrative 70 year old female with history of hypertension, migraine, colonic diverticulosis, GERD, progressive degenerative lumbar scoliosis with significant functional decline and intractable pain without relief with pain medications (has allergies to multiple narcotics) and epidural injection was taken to or on 06/16/2015 for anterior decompression fusion of  L1-L4 and posterior decompression of right L4-L5, lateral arthrodesis from T12-sacrum. Patient was extubated postop and PC CM and was assisting with management. Transferred to medical floor and hospitalist assisting with medical management.  Assessment/Plan: Progressive lumbar degenerative scoliosis with intractable pain Status post anterior decompression of L1-L4 and posterior and posterior decompression of right-sided L4 and L5 with segmental fixation from T12-ileum, posterior lateral arthrodesis from t12-sacrum. Pain control per neurosurgery. Getting Vicodin. Toradol added postoperatively given her acute anemia I have held it.  Acute blood loss anemia Possibly operative blood loss. Significant drop in H&H.( 6.2>>10.1). Received 1 unit PRBC and showed significant improvement. Check repeat H&H. Iron panel suggest iron deficiency. Check stool for occult blood. Avoid NSAIDs. Continue PPI. Had a normal EGD in 2013 and colonoscopy with polyp removed in 2009. Denies hematemesis, blood per rectum or passing dark stools. I  will Start her on iron supplements.   GERD Continue Protonix and Pepcid.  Hypertension with soft blood pressure Monitoring off medications  Hyperglycemia Related to steroid. Continue to monitor.  DVT prophylaxis: SCDs. Resume lovenox  Diet: Regular  We'll sign off. Thank you for the consult. Please call for any questions.  Code Status: Full code Family Communication: Husband at  bedside Disposition Plan: CIR consulted. Per primary team.       HPI/Subjective: Seen and examined. Feels much better today. Still has back pain but better.  Objective: Filed Vitals:   06/20/15 0600 06/20/15 0837  BP: 130/81 142/64  Pulse: 71 72  Temp: 97.7 F (36.5 C) 97.7 F (36.5 C)  Resp: 16 16    Intake/Output Summary (Last 24 hours) at 06/20/15 1150 Last data filed at 06/20/15 0950  Gross per 24 hour  Intake      0 ml  Output   1900 ml  Net  -1900 ml   Filed Weights   06/15/15 1300 06/16/15 2255  Weight: 80 kg (176 lb 5.9 oz) 72.4 kg (159 lb 9.8 oz)    Exam:   General: Elderly thin built female not in distress  HEENT: Pallor present, moist mucosa  Chest: Clear bilaterally  CVS: Normal S1, no murmurs rub or gallop  GI: Soft, nondistended, nontender, bowel sounds present  Musculoskeletal:, warm, no edema  CNS: Alert and oriented   Data Reviewed: Basic Metabolic Panel:  Recent Labs Lab 06/17/15 0300 06/17/15 0839 06/19/15 0550  NA 137  --  140  K 4.3  --  5.0  CL 105  --  109  CO2 21*  --  24  GLUCOSE 214*  --  132*  BUN 18  --  17  CREATININE 0.93  --  0.65  CALCIUM 7.7*  --  7.8*  MG  --  1.4* 2.4  PHOS  --  5.0* 1.6*   Liver Function Tests: No results for input(s): AST, ALT, ALKPHOS, BILITOT, PROT, ALBUMIN in the last 168 hours. No results for input(s): LIPASE, AMYLASE in the last 168 hours. No results for input(s): AMMONIA in the last 168 hours. CBC:  Recent Labs Lab 06/17/15 0300 06/19/15 0550 06/19/15 1508  WBC 15.9* 11.5* 12.4*  NEUTROABS  --  9.1*  --   HGB 10.1* 6.2* 8.2*  HCT 31.7* 19.1* 25.8*  MCV 90.6 90.5 91.2  PLT 237 183 210   Cardiac Enzymes: No results for input(s): CKTOTAL, CKMB, CKMBINDEX, TROPONINI in the last 168 hours. BNP (last 3 results) No results for input(s): BNP in the last 8760 hours.  ProBNP (last 3 results) No results for input(s): PROBNP in the last 8760 hours.  CBG:  Recent Labs Lab  06/17/15 2206 06/18/15 0824 06/18/15 1140 06/18/15 2147 06/18/15 2149  GLUCAP 121* 111* 119* 152* 138*    Recent Results (from the past 240 hour(s))  Surgical pcr screen     Status: None   Collection Time: 06/15/15  9:04 PM  Result Value Ref Range Status   MRSA, PCR NEGATIVE NEGATIVE Final   Staphylococcus aureus NEGATIVE NEGATIVE Final    Comment:        The Xpert SA Assay (FDA approved for NASAL specimens in patients over 51 years of age), is one component of a comprehensive surveillance program.  Test performance has been validated by [facility] for patients greater than or equal to 38 year old. It is not intended to diagnose infection nor to guide or monitor treatment.      Studies: No results found.  Scheduled Meds: . antiseptic oral rinse  7 mL Mouth Rinse BID  . budesonide (PULMICORT) nebulizer solution  0.5 mg Nebulization BID  . cycloSPORINE  1 drop Both Eyes BID  . dexamethasone  2 mg Oral Q12H  . docusate sodium  100 mg Oral BID  . escitalopram  10 mg Oral BID  . estrogens (conjugated)  0.3 mg Oral Daily  . famotidine  20 mg Oral QHS  . fenofibrate  54 mg Oral Daily  . ferrous gluconate  324 mg Oral BID WC  . gabapentin  100 mg Oral BID  . ketoconazole  1 application Topical BID  . levothyroxine  25 mcg Oral QHS  . pantoprazole  20 mg Oral Daily  . pravastatin  40 mg Oral QHS  . pyridOXINE  200 mg Oral QODAY  . senna  1 tablet Oral BID  . sodium chloride flush  3 mL Intravenous Q12H  . traZODone  50 mg Oral QHS   Continuous Infusions: . sodium chloride         Time spent: 25 minutes    Camaryn Lumbert, [place]  [facility] Pager [phone] If 7PM-7AM, please contact night-coverage at [email or web address], password [facility] 06/20/2015, 11:50 AM  LOS: 8 days

## 2015-06-20 NOTE — Progress Notes (Signed)
Pt unable to urinate at this time stated that she is in discomfort  like she is retaining urine.  Foley catheter 16 fr/10 cc placed, 450 ml clear, yellow urine in drainage bag. Will continue to monitor.

## 2015-06-20 NOTE — Progress Notes (Signed)
Physical Therapy Treatment Patient Details Name: Rita Gregory MRN: [id number] DOB: [birth year] Today's Date: 06/20/2015    History of Present Illness This 70 y.o. female admitted for intractable back, hip and leg pain.  She underwent Anterolateral decompression and fusion L1-2, L2-3, L3-4, x lift spacer.  Stage II posterior decompression or Rt sided L4 and L5 nerve roots with transforaminal interbody fusion L4-5 on Rt.  Segmental fixation from T12 to ilium with pedicle screws and rods, posterior lateral arthodesis from T12 to sacram with local autograft.  PMH includes:  degenerative scoliosis lumbar spine, diverticulosis of colon, fibromyalgia, HTN, PFO, chronic pain, migraine, parozysmal spells, s/p appendectomy, cholycystectomy, Rt TKA '05, Rt rotator cuff repair     PT Comments    Husband present throughout. Patient very lethargic, confused, and impulsive. Wanted to go to bathroom and assisted to [facility]. Attempted ambulation with patient very unsteady and not adhering to back precautions (requiring mod assist for balance and maintaining upright posture). Patient tried sitting in chair. Restless and reporting LLE pain 8/10. Patient at risk for fall and returned to supine in bed. RN aware.  Follow Up Recommendations  CIR     Equipment Recommendations  None recommended by PT    Recommendations for Other Services       Precautions / Restrictions Precautions Precautions: Fall;Back Precaution Comments: reinforced log roll technique.  Required Braces or Orthoses: Spinal Brace Spinal Brace: Applied in sitting position;Other (comment) Spinal Brace Comments: spinal brace in room, no orders for one, donned EOB    Mobility  Bed Mobility Overal bed mobility: Needs Assistance Bed Mobility: Rolling;Sidelying to Sit Rolling: Min assist Sidelying to sit: Mod assist     Sit to sidelying: Mod assist General bed mobility comments: HOB flat, with rail; max cues for maintaining back precautions  due to impaired cognition/restless  Transfers Overall transfer level: Needs assistance Equipment used: Rolling walker (2 wheeled) Transfers: Sit to/from Stand Sit to Stand: +2 physical assistance;Min assist Stand pivot transfers: Min assist       General transfer comment: 2 persons for safety; pt arching her back repeatedly and required hands on assist to stay upright; x3 with return to bed due to restless in chair with incr fall risk  Ambulation/Gait Ambulation/Gait assistance: Mod assist;+2 safety/equipment Ambulation Distance (Feet): 8 Feet Assistive device: Rolling walker (2 wheeled) Gait Pattern/deviations: Step-to pattern;Decreased stride length;Leaning posteriorly;Drifts right/left;Wide base of support Gait velocity: very slow Gait velocity interpretation: Below normal speed for age/gender General Gait Details: patient impulsively throwing her head and shoulders back (?in effort to stretch/relieve pain) requiring max verbal and mod assist to maintian back precautions   Stairs            Wheelchair Mobility    Modified Rankin (Stroke Patients Only)       Balance     Sitting balance-Leahy Scale: Poor Sitting balance - Comments: min guard to supervision in sitting due to posterior preference   Standing balance support: Bilateral upper extremity supported Standing balance-Leahy Scale: Poor                      Cognition Arousal/Alertness: Lethargic;Suspect due to medications Behavior During Therapy: Restless;Impulsive Overall Cognitive Status: Impaired/Different from baseline (due to meds per husband) Area of Impairment: Attention;Memory;Following commands;Safety/judgement;Awareness;Problem solving   Current Attention Level: Sustained Memory: Decreased recall of precautions;Decreased short-term memory Following Commands: Follows one step commands inconsistently Safety/Judgement: Decreased awareness of safety;Decreased awareness of deficits    Problem Solving: Difficulty sequencing;Requires verbal cues;Requires  tactile cues      Exercises      General Comments General comments (skin integrity, edema, etc.): BP supine 129/73      Pertinent Vitals/Pain Pain Assessment: 0-10 Pain Score: 8  Pain Location: back Pain Descriptors / Indicators: Grimacing;Restless Pain Intervention(s): Limited activity within patient's tolerance;Monitored during session;Premedicated before session;Repositioned;Patient requesting pain meds-RN notified    Home Living                      Prior Function            PT Goals (current goals can now be found in the care plan section) Acute Rehab PT Goals Patient Stated Goal: To regain independence  Time For Goal Achievement: 07/01/15 Progress towards PT goals: Not progressing toward goals - comment (per RN 1st time with new pain medicine; oversedated)    Frequency  Min 5X/week    PT Plan Current plan remains appropriate    Co-evaluation             End of Session Equipment Utilized During Treatment: Back brace;Gait belt Activity Tolerance: Patient limited by pain;Patient limited by lethargy;Other (comment) (confusion) Patient left: with call bell/phone within reach;with family/visitor present;in bed;with bed alarm set;with nursing/sitter in room     Time: 1451-1528 PT Time Calculation (min) (ACUTE ONLY): 37 min  Charges:  $Gait Training: 8-22 mins $Therapeutic Activity: 8-22 mins                    G Codes:      Rockland Kotarski 07/02/15, 4:31 PM Pager [phone]

## 2015-06-20 NOTE — Progress Notes (Deleted)
Pt assist x2 with gait belt transferred to the chair to sit up for lunch. Soft cervical collar on and aligned.  No noted distress. Pain decreased. Call bell within reach. Will continue to monitor.

## 2015-06-20 NOTE — Progress Notes (Signed)
Subjective: Patient reports Patient overall doing okay difficult pain control mostly back pain occasional radiculopathy when sitting  Objective: Vital signs in last 24 hours: Temp:  [97.7 F (36.5 C)-98.2 F (36.8 C)] 97.7 F (36.5 C) (03/11 0837) Pulse Rate:  [68-92] 72 (03/11 0837) Resp:  [16-18] 16 (03/11 0837) BP: (113-142)/(64-81) 142/64 mmHg (03/11 0837) SpO2:  [96 %-100 %] 99 % (03/11 0837)  Intake/Output from previous day: 03/10 0701 - 03/11 0700 In: 368.8 [Blood:368.8] Out: 1600 [Urine:1600] Intake/Output this shift: Total I/O In: -  Out: 250 [Urine:250]  Strength out of 5 wound clean dry and intact  Lab Results:  Recent Labs  06/19/15 0550 06/19/15 1508  WBC 11.5* 12.4*  HGB 6.2* 8.2*  HCT 19.1* 25.8*  PLT 183 210   BMET  Recent Labs  06/19/15 0550  NA 140  K 5.0  CL 109  CO2 24  GLUCOSE 132*  BUN 17  CREATININE 0.65  CALCIUM 7.8*    Studies/Results: No results found.  Assessment/Plan: Continue to mobilize with physical and occupational therapy. We'll bladder scan of this morning if she is retaining we'll need to place a catheter and leave it in. I well DC her hydrocodone started on oxycodone try to get her some more pain control.  LOS: 8 days     Tylan Kinn P 06/20/2015, 9:00 AM

## 2015-06-21 NOTE — Progress Notes (Signed)
Subjective: Patient reports Still condition of back pain arouse the patient from a deep sleep. No significant improvement with adjustments of pain medication. Still no leg pain all back pain  Objective: Vital signs in last 24 hours: Temp:  [98.1 F (36.7 C)-99 F (37.2 C)] 98.4 F (36.9 C) (03/12 0500) Pulse Rate:  [69-75] 74 (03/12 0500) Resp:  [16] 16 (03/12 0500) BP: (129-139)/(70-83) 136/83 mmHg (03/12 0500) SpO2:  [99 %-100 %] 99 % (03/12 0500)  Intake/Output from previous day: 03/11 0701 - 03/12 0700 In: -  Out: 2300 [Urine:2300] Intake/Output this shift:    Strength 5 out of 5 wound clean dry and intact  Lab Results:  Recent Labs  06/19/15 0550 06/19/15 1508  WBC 11.5* 12.4*  HGB 6.2* 8.2*  HCT 19.1* 25.8*  PLT 183 210   BMET  Recent Labs  06/19/15 0550  NA 140  K 5.0  CL 109  CO2 24  GLUCOSE 132*  BUN 17  CREATININE 0.65  CALCIUM 7.8*    Studies/Results: Dg Lumbar Spine 2-3 Views  06/20/2015  CLINICAL DATA:  70 year old female with history of back surgery on 06/16/2015. Back pain. EXAM: LUMBAR SPINE - 2-3 VIEW COMPARISON:  Multiple priors, most recently 06/16/2015. FINDINGS: Postoperative changes of PLIF are noted, which appear to extend from T12 to S2, likely involving bilateral sacroiliac joint arthrodesis. Dextroscoliosis on the standing frontal projection, convex to the right at the level of L3. This appears less severe than the preoperative study. Interbody grafts are noted at L1-L2, L2-L3, L3-L4 and L4-L5. IMPRESSION: 1. Postoperative changes of PLIF and bilateral sacroiliac joint arthrodesis, as above. Electronically Signed   By: Vinnie Langton M.D.   On: 06/20/2015 13:31    Assessment/Plan: Continue physical occupational therapy  LOS: 9 days     Kelda Azad P 06/21/2015, 9:43 AM

## 2015-06-21 NOTE — Progress Notes (Signed)
Physical Therapy Treatment Patient Details Name: Rita Gregory MRN: [id number] DOB: 10-12-1945 Today's Date: 06/21/2015    History of Present Illness This 70 y.o. female admitted for intractable back, hip and leg pain.  She underwent Anterolateral decompression and fusion L1-2, L2-3, L3-4, x lift spacer.  Stage II posterior decompression or Rt sided L4 and L5 nerve roots with transforaminal interbody fusion L4-5 on Rt.  Segmental fixation from T12 to ilium with pedicle screws and rods, posterior lateral arthodesis from T12 to sacram with local autograft.  PMH includes:  degenerative scoliosis lumbar spine, diverticulosis of colon, fibromyalgia, HTN, PFO, chronic pain, migraine, parozysmal spells, s/p appendectomy, cholycystectomy, Rt TKA '05, Rt rotator cuff repair     PT Comments    Significant improvement today. Ambulates up to 100 feet with intermittent assistance for balance, using a rolling walker for support. Easily distracted with poor recall of back precautions, despite reviewing several times during therapy session. She does however maintain precautions throughout. Patient will continue to benefit from skilled physical therapy services to further improve independence with functional mobility.   Follow Up Recommendations  CIR (If pt does not qualify for CIR, consider [facility])     Equipment Recommendations  None recommended by PT    Recommendations for Other Services       Precautions / Restrictions Precautions Precautions: Fall;Back Precaution Comments: reinforced log roll technique.  Required Braces or Orthoses: Spinal Brace Spinal Brace: Applied in sitting position;Other (comment) Spinal Brace Comments: spinal brace in room, no orders for one, donned EOB Restrictions Weight Bearing Restrictions: No    Mobility  Bed Mobility Overal bed mobility: Needs Assistance Bed Mobility: Rolling;Sidelying to Sit Rolling: Supervision Sidelying to sit: Supervision        General bed mobility comments: Supervision for safety with use of rail to rise to EOB. Did not require cues to perform this date  Transfers Overall transfer level: Needs assistance Equipment used: Rolling walker (2 wheeled) Transfers: Sit to/from Stand Sit to Stand: Min guard         General transfer comment: Min guard for safety. Slight posterior lean. Practiced from bed and recliner. VC for hand placement.  Ambulation/Gait Ambulation/Gait assistance: Min assist Ambulation Distance (Feet): 100 Feet Assistive device: Rolling walker (2 wheeled) Gait Pattern/deviations: Step-through pattern;Decreased stride length;Scissoring;Ataxic;Staggering left;Staggering right;Drifts right/left Gait velocity: decreased Gait velocity interpretation: Below normal speed for age/gender General Gait Details: Min assist intermittently for balance. Frequent VC for attention to gait mechanics. Grossly uncoordinated, no buckling. VC for walker placement for support. Easily distracted. Good backwards steps.   Stairs            Wheelchair Mobility    Modified Rankin (Stroke Patients Only)       Balance                                    Cognition Arousal/Alertness: Awake/alert Behavior During Therapy: WFL for tasks assessed/performed Overall Cognitive Status: Impaired/Different from baseline Area of Impairment: Attention;Memory;Following commands;Safety/judgement;Problem solving   Current Attention Level: Sustained Memory: Decreased recall of precautions;Decreased short-term memory Following Commands: Follows one step commands consistently Safety/Judgement: Decreased awareness of safety;Decreased awareness of deficits   Problem Solving: Difficulty sequencing;Requires verbal cues;Requires tactile cues      Exercises      General Comments        Pertinent Vitals/Pain Pain Assessment: Faces Faces Pain Scale: Hurts little more Pain Location: back, RLE  Pain  Descriptors / Indicators: Aching Pain Intervention(s): Monitored during session;Repositioned    Home Living                      Prior Function            PT Goals (current goals can now be found in the care plan section) Acute Rehab PT Goals Patient Stated Goal: To regain independence  PT Goal Formulation: With patient/family Time For Goal Achievement: 07/01/15 Potential to Achieve Goals: Good Progress towards PT goals: Progressing toward goals    Frequency  Min 5X/week    PT Plan Current plan remains appropriate    Co-evaluation             End of Session Equipment Utilized During Treatment: Back brace;Gait belt Activity Tolerance: Patient tolerated treatment well Patient left: with call bell/phone within reach;with family/visitor present;in chair;with chair alarm set     Time: [id number] PT Time Calculation (min) (ACUTE ONLY): 27 min  Charges:  $Gait Training: 8-22 mins $Therapeutic Activity: 8-22 mins                    G Codes:      Ellouise Newer Jul 03, 2015, 12:56 PM Camille Bal [place], [place]

## 2015-06-22 ENCOUNTER — Inpatient Hospital Stay (HOSPITAL_COMMUNITY)
Admission: AD | Admit: 2015-06-22 | Discharge: 2015-07-01 | DRG: 092 | Disposition: A | Discharge status: Home Health | Payer: 59 | Source: Intra-hospital | Attending: Physical Medicine & Rehabilitation | Admitting: Physical Medicine & Rehabilitation

## 2015-06-22 DIAGNOSIS — M48061 Spinal stenosis, lumbar region without neurogenic claudication: Secondary | ICD-10-CM

## 2015-06-22 DIAGNOSIS — E46 Unspecified protein-calorie malnutrition: Secondary | ICD-10-CM | POA: Diagnosis present

## 2015-06-22 DIAGNOSIS — M797 Fibromyalgia: Secondary | ICD-10-CM | POA: Diagnosis present

## 2015-06-22 DIAGNOSIS — G8929 Other chronic pain: Secondary | ICD-10-CM | POA: Diagnosis present

## 2015-06-22 DIAGNOSIS — I10 Essential (primary) hypertension: Secondary | ICD-10-CM | POA: Diagnosis present

## 2015-06-22 DIAGNOSIS — E611 Iron deficiency: Secondary | ICD-10-CM | POA: Diagnosis present

## 2015-06-22 DIAGNOSIS — Q211 Atrial septal defect: Secondary | ICD-10-CM

## 2015-06-22 DIAGNOSIS — D62 Acute posthemorrhagic anemia: Secondary | ICD-10-CM | POA: Diagnosis present

## 2015-06-22 DIAGNOSIS — K219 Gastro-esophageal reflux disease without esophagitis: Secondary | ICD-10-CM | POA: Diagnosis present

## 2015-06-22 DIAGNOSIS — E8809 Other disorders of plasma-protein metabolism, not elsewhere classified: Secondary | ICD-10-CM | POA: Diagnosis present

## 2015-06-22 DIAGNOSIS — R29898 Other symptoms and signs involving the musculoskeletal system: Secondary | ICD-10-CM

## 2015-06-22 DIAGNOSIS — M4806 Spinal stenosis, lumbar region: Secondary | ICD-10-CM | POA: Diagnosis not present

## 2015-06-22 DIAGNOSIS — Z981 Arthrodesis status: Secondary | ICD-10-CM | POA: Diagnosis not present

## 2015-06-22 DIAGNOSIS — T40605A Adverse effect of unspecified narcotics, initial encounter: Secondary | ICD-10-CM | POA: Diagnosis present

## 2015-06-22 DIAGNOSIS — R270 Ataxia, unspecified: Principal | ICD-10-CM | POA: Diagnosis present

## 2015-06-22 DIAGNOSIS — N319 Neuromuscular dysfunction of bladder, unspecified: Secondary | ICD-10-CM | POA: Diagnosis not present

## 2015-06-22 DIAGNOSIS — F419 Anxiety disorder, unspecified: Secondary | ICD-10-CM | POA: Diagnosis present

## 2015-06-22 DIAGNOSIS — R531 Weakness: Secondary | ICD-10-CM | POA: Diagnosis present

## 2015-06-22 DIAGNOSIS — G894 Chronic pain syndrome: Secondary | ICD-10-CM

## 2015-06-22 DIAGNOSIS — N390 Urinary tract infection, site not specified: Secondary | ICD-10-CM | POA: Diagnosis present

## 2015-06-22 DIAGNOSIS — G4700 Insomnia, unspecified: Secondary | ICD-10-CM | POA: Diagnosis present

## 2015-06-22 DIAGNOSIS — B9620 Unspecified Escherichia coli [E. coli] as the cause of diseases classified elsewhere: Secondary | ICD-10-CM | POA: Diagnosis present

## 2015-06-22 DIAGNOSIS — K5903 Drug induced constipation: Secondary | ICD-10-CM | POA: Diagnosis not present

## 2015-06-22 DIAGNOSIS — D72829 Elevated white blood cell count, unspecified: Secondary | ICD-10-CM

## 2015-06-22 MED ORDER — PANTOPRAZOLE SODIUM 20 MG PO TBEC
20.0000 mg | DELAYED_RELEASE_TABLET | Freq: Every day | ORAL | Status: DC
  Administered 2015-06-23 – 2015-07-01 (×9): 20 mg via ORAL
  Filled 2015-06-22 (×9): qty 1

## 2015-06-22 MED ORDER — DOCUSATE SODIUM 100 MG PO CAPS
100.0000 mg | ORAL_CAPSULE | Freq: Two times a day (BID) | ORAL | Status: DC
Start: 2015-06-22 — End: 2015-07-01
  Administered 2015-06-22 – 2015-07-01 (×18): 100 mg via ORAL
  Filled 2015-06-22 (×18): qty 1

## 2015-06-22 MED ORDER — TRAMADOL HCL 50 MG PO TABS
50.0000 mg | ORAL_TABLET | Freq: Four times a day (QID) | ORAL | Status: DC | PRN
  Administered 2015-06-23 – 2015-06-30 (×7): 50 mg via ORAL
  Filled 2015-06-22 (×8): qty 1

## 2015-06-22 MED ORDER — FLEET ENEMA 7-19 GM/118ML RE ENEM: 1.0000 | ENEMA | Freq: Once | RECTAL | Status: DC | PRN

## 2015-06-22 MED ORDER — BISACODYL 10 MG RE SUPP: 10.0000 mg | Freq: Every day | RECTAL | Status: DC | PRN

## 2015-06-22 MED ORDER — DIPHENHYDRAMINE HCL 12.5 MG/5ML PO ELIX: 12.5000 mg | ORAL_SOLUTION | Freq: Four times a day (QID) | ORAL | Status: DC | PRN

## 2015-06-22 MED ORDER — OXYCODONE HCL 5 MG PO TABS
10.0000 mg | ORAL_TABLET | ORAL | Status: DC | PRN
  Administered 2015-06-22 – 2015-06-30 (×32): 10 mg via ORAL
  Filled 2015-06-22 (×35): qty 2

## 2015-06-22 MED ORDER — ALUM & MAG HYDROXIDE-SIMETH 200-200-20 MG/5ML PO SUSP: 30.0000 mL | ORAL | Status: DC | PRN

## 2015-06-22 MED ORDER — PROCHLORPERAZINE MALEATE 5 MG PO TABS: 5.0000 mg | ORAL_TABLET | Freq: Four times a day (QID) | ORAL | Status: DC | PRN

## 2015-06-22 MED ORDER — PROCHLORPERAZINE EDISYLATE 5 MG/ML IJ SOLN: 5.0000 mg | Freq: Four times a day (QID) | INTRAMUSCULAR | Status: DC | PRN

## 2015-06-22 MED ORDER — ENOXAPARIN SODIUM 40 MG/0.4ML ~~LOC~~ SOLN
40.0000 mg | SUBCUTANEOUS | Status: DC
  Administered 2015-06-23 – 2015-06-30 (×8): 40 mg via SUBCUTANEOUS
  Filled 2015-06-22 (×8): qty 0.4

## 2015-06-22 MED ORDER — ESTROGENS CONJUGATED 0.3 MG PO TABS
0.3000 mg | ORAL_TABLET | Freq: Every day | ORAL | Status: DC
  Administered 2015-06-23 – 2015-07-01 (×9): 0.3 mg via ORAL
  Filled 2015-06-22 (×9): qty 1

## 2015-06-22 MED ORDER — PROCHLORPERAZINE 25 MG RE SUPP: 12.5000 mg | Freq: Four times a day (QID) | RECTAL | Status: DC | PRN

## 2015-06-22 MED ORDER — FLAVOXATE HCL 100 MG PO TABS
100.0000 mg | ORAL_TABLET | Freq: Three times a day (TID) | ORAL | Status: DC | PRN
  Filled 2015-06-22: qty 1

## 2015-06-22 MED ORDER — CYCLOSPORINE 0.05 % OP EMUL
1.0000 [drp] | Freq: Two times a day (BID) | OPHTHALMIC | Status: DC
  Administered 2015-06-22 – 2015-07-01 (×18): 1 [drp] via OPHTHALMIC
  Filled 2015-06-22 (×20): qty 1

## 2015-06-22 MED ORDER — PRAVASTATIN SODIUM 40 MG PO TABS
40.0000 mg | ORAL_TABLET | Freq: Every day | ORAL | Status: DC
  Administered 2015-06-22 – 2015-06-30 (×9): 40 mg via ORAL
  Filled 2015-06-22 (×9): qty 1

## 2015-06-22 MED ORDER — DEXAMETHASONE 2 MG PO TABS
2.0000 mg | ORAL_TABLET | Freq: Two times a day (BID) | ORAL | Status: DC
  Administered 2015-06-22 – 2015-07-01 (×18): 2 mg via ORAL
  Filled 2015-06-22 (×21): qty 1

## 2015-06-22 MED ORDER — VITAMIN B-6 100 MG PO TABS
200.0000 mg | ORAL_TABLET | ORAL | Status: DC
  Administered 2015-06-23 – 2015-07-01 (×5): 200 mg via ORAL
  Filled 2015-06-22 (×10): qty 2

## 2015-06-22 MED ORDER — FERROUS GLUCONATE 324 (38 FE) MG PO TABS
324.0000 mg | ORAL_TABLET | Freq: Two times a day (BID) | ORAL | Status: DC
  Administered 2015-06-23 – 2015-07-01 (×18): 324 mg via ORAL
  Filled 2015-06-22 (×20): qty 1

## 2015-06-22 MED ORDER — LIDOCAINE HCL 2 % EX GEL: CUTANEOUS | Status: DC | PRN

## 2015-06-22 MED ORDER — POLYETHYLENE GLYCOL 3350 17 G PO PACK
17.0000 g | PACK | Freq: Every day | ORAL | Status: DC | PRN
  Filled 2015-06-22: qty 1

## 2015-06-22 MED ORDER — LEVOTHYROXINE SODIUM 25 MCG PO TABS
25.0000 ug | ORAL_TABLET | Freq: Every day | ORAL | Status: DC
  Administered 2015-06-22 – 2015-06-30 (×7): 25 ug via ORAL
  Filled 2015-06-22 (×9): qty 1

## 2015-06-22 MED ORDER — FENOFIBRATE 54 MG PO TABS
54.0000 mg | ORAL_TABLET | Freq: Every day | ORAL | Status: DC
  Administered 2015-06-23 – 2015-07-01 (×9): 54 mg via ORAL
  Filled 2015-06-22 (×9): qty 1

## 2015-06-22 MED ORDER — ALUM & MAG HYDROXIDE-SIMETH 200-200-20 MG/5ML PO SUSP: 30.0000 mL | Freq: Four times a day (QID) | ORAL | Status: DC | PRN

## 2015-06-22 MED ORDER — GABAPENTIN 100 MG PO CAPS
100.0000 mg | ORAL_CAPSULE | Freq: Two times a day (BID) | ORAL | Status: DC
  Administered 2015-06-22 – 2015-07-01 (×18): 100 mg via ORAL
  Filled 2015-06-22 (×19): qty 1

## 2015-06-22 MED ORDER — TRAZODONE HCL 50 MG PO TABS
50.0000 mg | ORAL_TABLET | Freq: Every day | ORAL | Status: DC
  Administered 2015-06-22 – 2015-06-30 (×9): 50 mg via ORAL
  Filled 2015-06-22 (×10): qty 1

## 2015-06-22 MED ORDER — KETOCONAZOLE 2 % EX CREA
1.0000 "application " | TOPICAL_CREAM | Freq: Two times a day (BID) | CUTANEOUS | Status: DC
  Administered 2015-06-22 – 2015-07-01 (×14): 1 via TOPICAL
  Filled 2015-06-22 (×3): qty 15

## 2015-06-22 MED ORDER — GUAIFENESIN-DM 100-10 MG/5ML PO SYRP: 5.0000 mL | ORAL_SOLUTION | Freq: Four times a day (QID) | ORAL | Status: DC | PRN

## 2015-06-22 MED ORDER — ALBUTEROL SULFATE (2.5 MG/3ML) 0.083% IN NEBU: 3.0000 mL | INHALATION_SOLUTION | Freq: Four times a day (QID) | RESPIRATORY_TRACT | Status: DC | PRN

## 2015-06-22 MED ORDER — METHOCARBAMOL 500 MG PO TABS
500.0000 mg | ORAL_TABLET | Freq: Four times a day (QID) | ORAL | Status: DC | PRN
  Administered 2015-06-23 – 2015-06-30 (×10): 500 mg via ORAL
  Filled 2015-06-22 (×12): qty 1

## 2015-06-22 MED ORDER — SENNOSIDES-DOCUSATE SODIUM 8.6-50 MG PO TABS
1.0000 | ORAL_TABLET | Freq: Two times a day (BID) | ORAL | Status: DC
Start: 2015-06-22 — End: 2015-07-01
  Administered 2015-06-22 – 2015-06-30 (×17): 1 via ORAL
  Filled 2015-06-22 (×18): qty 1

## 2015-06-22 MED ORDER — ESCITALOPRAM OXALATE 10 MG PO TABS
10.0000 mg | ORAL_TABLET | Freq: Two times a day (BID) | ORAL | Status: DC
  Administered 2015-06-22 – 2015-07-01 (×18): 10 mg via ORAL
  Filled 2015-06-22 (×18): qty 1

## 2015-06-22 MED ORDER — FAMOTIDINE 20 MG PO TABS
20.0000 mg | ORAL_TABLET | Freq: Every day | ORAL | Status: DC
  Administered 2015-06-22 – 2015-06-30 (×9): 20 mg via ORAL
  Filled 2015-06-22 (×9): qty 1

## 2015-06-22 MED ORDER — BUDESONIDE 0.5 MG/2ML IN SUSP
0.5000 mg | Freq: Two times a day (BID) | RESPIRATORY_TRACT | Status: DC
  Administered 2015-06-23 – 2015-07-01 (×14): 0.5 mg via RESPIRATORY_TRACT
  Filled 2015-06-22 (×18): qty 2

## 2015-06-22 NOTE — Interval H&P Note (Signed)
Rita Gregory was admitted today to Inpatient Rehabilitation with the diagnosis of progressive Kyphoscoliosis with lumbar radiculopathy s/p anterolateral decompression with fusion from L1 to L4 and posterior decompression of right side L4 and L5 nerve roots with fusion of L4/5 and segmental fixation from T12 to illium. The patient's history has been reviewed, patient examined, and there is no change in status.  Patient continues to be appropriate for intensive inpatient rehabilitation.  I have reviewed the patient's chart and labs.  Questions were answered to the patient's satisfaction. The PAPE has been reviewed and assessment remains appropriate.  Rita Gregory Lorie Phenix 06/22/2015, 8:18 PM

## 2015-06-22 NOTE — Progress Notes (Addendum)
I met with pt at bedside. She has progressed well with therapy. I will contact [facility] for possible admission to inpt rehab today. [phone] I have insurance approval and contacted Dr. Elsner by phone. He is in agreement to d/c pt to inpt rehab today. [phone] 

## 2015-06-22 NOTE — Progress Notes (Signed)
Reason for Consult: Progressive Kyphoscoliosis with lumbar radiculopathy Referring Physician: Dr. Ellene Route.    HPI: Rita Gregory is a 70 y.o. female with history for HTN, fibromyalgia, chronic pain, kyphoscoliosis with increase in curve to 40 degrees who was admitted with intractible right buttock and back pain.and inability to ambulate on 06/12/15. Patient lives with her husband in [place], [place]. Her husband is a practicing physician for [facility] [employer]. She does not have 24/7 support at discharge. Two-level home with bedroom downstairs. She used a walker prior to admission however the past month she has been very sedentary. No local family to assist. She was admitted for pain management and required sedation for MRI which revealed progression in scoliotic stenosis on right with L4-5 and L5-S1 and L2/3 and L3/4 on left. She elected to undergo two stage procedure--anterolateral decompression with fusion from L1 to L4 followed by posterior decompression of right side L4 and L5 nerve roots with fusion of L4/5 and segmental fixation from T12 to illium with pedicle screws and rods on 06/16/15. Post op with hypotension and lethargy affecting mobility. Pt with limitted participation in exam. PT/OT evaluations done and CIR recommended for follow up therapy.   Review of Systems  Constitutional: Negative for fever and chills.  HENT: Negative for hearing loss.  Eyes: Negative for blurred vision and double vision.  Respiratory: Negative for cough and shortness of breath.  Cardiovascular: Negative for chest pain, palpitations and leg swelling.  Gastrointestinal: Positive for constipation. Negative for nausea and vomiting.   GERD  Musculoskeletal: Positive for myalgias, back pain and joint pain.  Skin: Negative for rash.  Neurological: Positive for dizziness, weakness and headaches. Negative for seizures.  Psychiatric/Behavioral: Positive for depression. The patient has  insomnia.  All other systems reviewed and are negative.    Past Medical History  Diagnosis Date  . Diverticulosis of colon (without mention of hemorrhage) 11/09/2007  . Colon polyp 11/09/2007    Hyperplastic   . Esophageal reflux   . Fibromyalgia   . Hypertension   . Kyphoscoliosis     40 degree bend ro right side  . Chronic pain     Dr. Ellene Route follows  . Patent foramen ovale     hx.- Dr. Lemmie Evens. Smith,cardiology- follows  . Migraine   . Paroxysmal spells ([place]) 02/13/2013    Past Surgical History  Procedure Laterality Date  . Appendectomy    . Cholecystectomy    . Abdominal hysterectomy    . Joint replacement      RTKA '05  . Knee arthroscopy w/ acl reconstruction      Rt. Knee  . Dry eyes      uses Restasis daily  . Cesarean section      x2  . Right rotator cuff  2011    sx  . Esophagogastroduodenoscopy  02/23/2012    Procedure: ESOPHAGOGASTRODUODENOSCOPY (EGD); Surgeon: Irene Shipper, MD; Location: [facility]; Service: Endoscopy; Laterality: N/A;  . Radiology with anesthesia N/A 06/15/2015    Procedure: RADIOLOGY WITH ANESTHESIA; Surgeon: Medication Radiologist, MD; Location: [facility]; Service: Radiology; Laterality: N/A;  . Anterior lateral lumbar fusion 4 levels Left 06/16/2015    Procedure: ANTERIOR LATERAL LUMBAR FUSION THORACIC TWELVE-LUMBAR FOUR; Surgeon: Kristeen Miss, MD; Location: [facility]; Service: Neurosurgery; Laterality: Left; Thoracolumbar spine  . Posterior lumbar fusion 4 level N/A 06/16/2015    Procedure: POSTERIOR LUMBAR FUSION LUMBAR FOUR-FIVE LUMBAR FIVE-SACRAL ONE; Surgeon: Kristeen Miss, MD; Location: [facility]; Service: Neurosurgery; Laterality: N/A;  Family History  Problem Relation Age of Onset  . Ovarian cancer Mother     passed at age 11  . Heart disease Father   . Kidney disease  Father     Kidney cancer  . Stomach cancer Paternal Uncle   . Diabetes type II Father     passed at 57 yrs old  . Colon cancer Father     cancereous polyps  . Bipolar disorder Sister     Osteopenia  . Bipolar disorder Son     Social History: Married--was ambulating with a cane PTA. Husband is full time physician. reports that she has been passively smoking. She does not have any smokeless tobacco history on file. She reports that she drinks about 0.5 - 1.5 oz of alcohol per week. She reports that she does not use illicit drugs.   Allergies  Allergen Reactions  . Fentanyl Shortness Of Breath    Pt has tolerated this medication 06/2015 pre and during operation. MD ok giving.  . Morphine And Related Anaphylaxis    respiratory depression   . Bupropion Hcl     Dizzy, fog  . Codeine Nausea And Vomiting  . Demerol [Meperidine] Nausea And Vomiting  . Dextromethorphan Other (See Comments)    Unknown   . Guaifenesin & Derivatives     Other, unknown  . Lipitor [Atorvastatin]   . Lovaza [Omega-3-Acid Ethyl Esters] Diarrhea  . Lyrica [Pregabalin]     Fatigue and depression  . Naltrexone     depression  . Percodan [Oxycodone-Aspirin] Nausea And Vomiting  . Prilosec [Omeprazole]     Iliacs   . Topamax [Topiramate]     disorientation   . Valium [Diazepam]     Small doses are OK but large doses are not. Long-term use causes depression.   . Versed [Midazolam]     In moderate in large doses shortness of breath, and respiratory depression   . Adhesive [Tape] Rash    Skin irritation   . Concerta [Methylphenidate] Palpitations  . Lamotrigine Rash  . Nickel Rash    Skin irritation   . Oxycodone-Acetaminophen Nausea And Vomiting and Rash    depression  . Percocet [Oxycodone-Acetaminophen] Nausea And Vomiting and Rash  . Tegaderm Ag Mesh  [Silver] Rash   Medications Prior to Admission  Medication Sig Dispense Refill  . acetaminophen (TYLENOL) 650 MG CR tablet Take 650 mg by mouth every 8 (eight) hours as needed for pain.    Marland Kitchen albuterol (PROVENTIL HFA;VENTOLIN HFA) 108 (90 BASE) MCG/ACT inhaler Inhale 2 puffs into the lungs every 6 (six) hours as needed. For shortness of breath    . Choline Fenofibrate (TRILIPIX) 135 MG capsule Take 135 mg by mouth at bedtime.    . cycloSPORINE (RESTASIS) 0.05 % ophthalmic emulsion Place 1 drop into both eyes 2 (two) times daily.    Marland Kitchen EPINEPHrine (EPIPEN 2-PAK) 0.3 mg/0.3 mL DEVI Inject 0.3 mg into the muscle once. To medications, touch, or inhaltions    . escitalopram (LEXAPRO) 10 MG tablet Take 10 mg by mouth 2 (two) times daily.    Marland Kitchen estrogens, conjugated, (PREMARIN) 0.3 MG tablet Take 0.3 mg by mouth daily.    Marland Kitchen gabapentin (NEURONTIN) 100 MG capsule Take 1 capsule (100 mg total) by mouth 2 (two) times daily. 60 capsule 3  . HYDROcodone-acetaminophen (NORCO/VICODIN) 5-325 MG tablet Take 1-2 tablets by mouth every 6 (six) hours as needed. pain  0  . ketoconazole (NIZORAL) 2 % cream Apply 1 application topically 2 (  two) times daily as needed for irritation.     Marland Kitchen L-Methylfolate-Algae (DEPLIN 15) 15-90.314 MG CAPS Take 1 capsule by mouth daily.  1  . lansoprazole (PREVACID) 30 MG capsule Take 30 mg by mouth 2 (two) times daily before a meal.    . levothyroxine (SYNTHROID, LEVOTHROID) 25 MCG tablet Take 25 mcg by mouth at bedtime.    Marland Kitchen lisinopril (PRINIVIL,ZESTRIL) 2.5 MG tablet Take 1.25 mg by mouth every morning.     . methylphenidate (DAYTRANA) 10 mg/9hr Place 1 patch onto the skin daily as needed (15mg  daily/10 mg as a prn). Take if extra if needed for ADHD. Wear patch for 9 hours only each day    . methylphenidate (DAYTRANA) 15 mg/9hr Place 1 patch onto the skin daily. wear patch for 9 hours only each day      . pravastatin (PRAVACHOL) 40 MG tablet Take 40 mg by mouth at bedtime.    . pyridOXINE (VITAMIN B-6) 100 MG tablet Take 100 mg by mouth daily.     . ranitidine (ZANTAC) 150 MG capsule Take 150 mg by mouth 2 (two) times daily as needed. For GERD    . testosterone (ANDROGEL) 50 MG/5GM GEL Place 5 g onto the skin See admin instructions. Three times weekly as needed for hormone replacement    . traZODone (DESYREL) 50 MG tablet Take 50 mg by mouth at bedtime.       Home: Home Living Family/patient expects to be discharged to:: Inpatient rehab Living Arrangements: Spouse/significant other Additional Comments: spouse is MD and is currently working full time   Functional History: Prior Function Level of Independence: Needs assistance Gait / Transfers Assistance Needed: Ambulates with a cane until pain became worse PTA. Functional Status:  Mobility: Bed Mobility Overal bed mobility: Needs Assistance Bed Mobility: Rolling, Sidelying to Sit, Sit to Sidelying Rolling: Max assist Sidelying to sit: Max assist, +2 for physical assistance Sit to sidelying: Max assist, +2 for physical assistance General bed mobility comments: Pt instructed in log rolling technique. She requires assist for all aspects of bed mobility pt Orthostatic in sitting with BP decreasing from 90/50 to 66/37. Transfers General transfer comment: unable to attempt due to orthostasis      ADL: ADL Overall ADL's : Needs assistance/impaired Eating/Feeding: Maximal assistance, Sitting Grooming: Maximal assistance, Bed level Grooming Details (indicate cue type and reason): Pt requires max encouragement to use bil. UEs  Upper Body Bathing: Total assistance, Bed level Lower Body Bathing: Total assistance, Bed level Upper Body Dressing : Total assistance, Bed level Lower Body Dressing: Total assistance, Bed level Toilet Transfer: Total assistance Toilet Transfer Details (indicate cue type and  reason): unable to assess due to orthostasis Toileting- Clothing Manipulation and Hygiene: Total assistance, Bed level Functional mobility during ADLs: Maximal assistance, +2 for physical assistance General ADL Comments: Pt moves slowly, requires encouragement to fully participate as she is very passive and tentative with her movement   Cognition: Cognition Overall Cognitive Status: Impaired/Different from baseline (Likely due to meds. Delayed responses.) Orientation Level: Oriented X4 Cognition Arousal/Alertness: Lethargic, Suspect due to medications (pt and spouse indicate pain meds make her drowsy.) Behavior During Therapy: Flat affect Overall Cognitive Status: Impaired/Different from baseline (Likely due to meds. Delayed responses.)   Blood pressure 87/62, pulse 86, temperature 97.1 F (36.2 C), temperature source Axillary, resp. rate 11, height 5\' 4"  (1.626 m), weight 72.4 kg (159 lb 9.8 oz), SpO2 95 %. Physical Exam  Vitals reviewed. Constitutional: She is oriented to person, place,  and time. She appears well-developed and well-nourished.  HENT:  Head: Normocephalic and atraumatic.  Eyes: Conjunctivae and EOM are normal. No scleral icterus.  Neck: Normal range of motion. Neck supple. No thyromegaly present.  Cardiovascular: Normal rate and regular rhythm.  Respiratory: Effort normal and breath sounds normal. No respiratory distress.  GI: Soft. Bowel sounds are normal. She exhibits no distension.  Musculoskeletal: She exhibits tenderness (Back). She exhibits no edema.  Neurological: She is oriented to person, place, and time.  Lethargic.  Follows simple commands answers basic questions. Sensation intact to light touch Motor: limitted by participation, B/l UE: 3-/5 proximal to distal B/l LE 2+/5 proximal to distal DTRs symmetric  Skin: Skin is warm and dry.  Back incision is dressed  Psychiatric: Her affect is blunt. Her speech is delayed. She is slowed.     Lab  Results Last 24 Hours    Results for orders placed or performed during the hospital encounter of 06/12/15 (from the past 24 hour(s))  Glucose, capillary Status: Abnormal   Collection Time: 06/16/15 10:53 PM  Result Value Ref Range   Glucose-Capillary 212 (H) 65 - 99 mg/dL  CBC Status: Abnormal   Collection Time: 06/17/15 3:00 AM  Result Value Ref Range   WBC 15.9 (H) 4.0 - 10.5 K/uL   RBC 3.50 (L) 3.87 - 5.11 MIL/uL   Hemoglobin 10.1 (L) 12.0 - 15.0 g/dL   HCT 31.7 (L) 36.0 - 46.0 %   MCV 90.6 78.0 - 100.0 fL   MCH 28.9 26.0 - 34.0 pg   MCHC 31.9 30.0 - 36.0 g/dL   RDW 14.0 11.5 - 15.5 %   Platelets 237 150 - 400 K/uL  Basic Metabolic Panel Status: Abnormal   Collection Time: 06/17/15 3:00 AM  Result Value Ref Range   Sodium 137 135 - 145 mmol/L   Potassium 4.3 3.5 - 5.1 mmol/L   Chloride 105 101 - 111 mmol/L   CO2 21 (L) 22 - 32 mmol/L   Glucose, Bld 214 (H) 65 - 99 mg/dL   BUN 18 6 - 20 mg/dL   Creatinine, Ser 0.93 0.44 - 1.00 mg/dL   Calcium 7.7 (L) 8.9 - 10.3 mg/dL   GFR calc non Af Amer >60 >60 mL/min   GFR calc Af Amer >60 >60 mL/min   Anion gap 11 5 - 15  Lactic acid, plasma Status: Abnormal   Collection Time: 06/17/15 8:39 AM  Result Value Ref Range   Lactic Acid, Venous 3.8 (HH) 0.5 - 2.0 mmol/L  Magnesium Status: Abnormal   Collection Time: 06/17/15 8:39 AM  Result Value Ref Range   Magnesium 1.4 (L) 1.7 - 2.4 mg/dL  Phosphorus Status: Abnormal   Collection Time: 06/17/15 8:39 AM  Result Value Ref Range   Phosphorus 5.0 (H) 2.5 - 4.6 mg/dL  Glucose, capillary Status: Abnormal   Collection Time: 06/17/15 10:12 AM  Result Value Ref Range   Glucose-Capillary 117 (H) 65 - 99 mg/dL   Comment 1 Notify RN    Comment 2 Document in Chart   Glucose, capillary Status: Abnormal    Collection Time: 06/17/15 11:53 AM  Result Value Ref Range   Glucose-Capillary 126 (H) 65 - 99 mg/dL   Comment 1 Notify RN    Comment 2 Document in Chart       Imaging Results (Last 48 hours)    Dg Chest 1 View  06/17/2015 CLINICAL DATA: 70 year old female with dyspnea EXAM: CHEST 1 VIEW COMPARISON: Prior chest x-ray 03/13/2012 FINDINGS:  Stable cardiac contours. Significant widening of the mediastinum compared to prior imaging. Inspiratory volumes are low. There is mild bibasilar atelectasis. Incompletely imaged posterior lumbar fixation hardware. No acute osseous abnormality. IMPRESSION: 1. Significant widening of the mediastinum compared to the prior chest x-ray from December of 2013. This may be artifactual and related to a combination of portable frontal technique and low inspiratory volumes. However, interval development of an ascending aortic aneurysm, mediastinal hematoma, or mediastinal mass is difficult to exclude entirely. Recommend dedicated PA and lateral chest x-ray. If the mediastinum again appears widened, further evaluation with CTA of the chest may be warranted. 2. Low inspiratory volumes with mild bibasilar atelectasis. These results will be called to the ordering clinician or representative by the Radiologist Assistant, and communication documented in the PACS or zVision Dashboard. Electronically Signed By: Jacqulynn Cadet M.D. On: 06/17/2015 08:04   Dg Lumbar Spine 2-3 Views  06/16/2015 CLINICAL DATA: T12-S1 lumbar fusion EXAM: DG C-ARM GT 120 MIN; LUMBAR SPINE - 2-3 VIEW FLUOROSCOPY TIME: Fluoroscopy Time (in minutes and seconds): 1 minutes 15 seconds COMPARISON: Lumbar spine CT dated 06/16/2015 FINDINGS: Intraoperative fluoroscopic images during T12-S1 posterior lumbar fusion. Two frontal and three lateral images were obtained, demonstrating surgical hardware in satisfactory position. IMPRESSION: Intraoperative fluoroscopic images, as above. Electronically  Signed By: Julian Hy M.D. On: 06/16/2015 21:32   Dg Pelvis 1-2 Views  06/15/2015 CLINICAL DATA: Preoperative examination. Right hip pain. EXAM: PELVIS - 1-2 VIEW COMPARISON: None. FINDINGS: There is no evidence of pelvic fracture or diastasis. No pelvic bone lesions are seen. Visualized hips appear intact. IMPRESSION: Negative. Electronically Signed By: Lucienne Capers M.D. On: 06/15/2015 21:21   Ct Lumbar Spine Wo Contrast  06/16/2015 CLINICAL DATA: Initial evaluation for right hip pain. Patient scheduled for spinal surgery tomorrow. EXAM: CT LUMBAR SPINE WITHOUT CONTRAST TECHNIQUE: Multidetector CT imaging of the lumbar spine was performed without intravenous contrast administration. Multiplanar CT image reconstructions were also generated. COMPARISON: Prior MRI of the lumbar spine performed on 06/15/2015. FINDINGS: Severe dextroscoliosis of the lumbar spine with apex at L3 again seen. Vertebral body heights are maintained without evidence of fracture. Trace retrolisthesis of L2 on L3. No other significant listhesis. Paraspinous soft tissues demonstrate no acute abnormality. Mild fatty atrophy within the posterior paraspinous musculature. Visualized visceral structures within normal limits. 13 mm cyst present within the lower pole left kidney. Cholecystectomy clips noted. Mild scattered atheromatous plaque within the infrarenal aorta. No aneurysm. No retroperitoneal adenopathy. T12-L1: Mild disc bulge without significant stenosis. L1-2: Degenerative intervertebral disc space narrowing with disc desiccation. Reactive endplate sclerosis with prominent endplate anterior osteophytic spurring, predominantly at the right anterolateral aspect of the L2-3 disc space. Mild bilateral facet arthrosis, slightly worse on the right. Associated right lateral recess stenosis at this level better evaluated on previous MRI. L2-3: Degenerative intervertebral disc space narrowing. Reactive endplate  changes, predominantly at the left aspect of the L2-3 disc space. Bulky endplate osteophytic spurring from the superior endplate of L3, and to a lesser extent the inferior endplate of L2. Disc bulging, asymmetric to the left, better evaluated on prior MRI. Bilateral facet arthrosis, slightly worse on the left. Associated left lateral recess stenosis at this level better evaluated on prior MRI. L3-4: Degenerative intervertebral disc space narrowing with disc desiccation. Reactive endplate changes with endplate osteophytic spurring, predominantly at the left aspect of the L3-4 disc space. Disc bulging, asymmetric to the left, better evaluated on MRI. Left greater than right facet disease. Gas within the epidural space at this  level likely degenerative in nature. Associated left lateral recess stenosis at this level better evaluated on prior MRI. L4-5: Degenerative disc bulge with disc desiccation. Moderate to severe right-sided facet disease. More moderate left-sided facet arthrosis. Broad-based disc protrusion better evaluated on prior MRI. Mild to moderate right lateral recess stenosis better seen on previous MRI. Moderate right foraminal narrowing related to bony osteophytic spurring from the right L4-5 S at. L5-S1: Degenerative intervertebral disc space narrowing with reactive endplate changes. Prominent endplate osteophytic spurring at the right aspect of the L5-S1 disc space. Severe facet arthrosis on the right. Resultant moderate right foraminal narrowing P no significant canal stenosis. IMPRESSION: 1. Severe dextroscoliosis with apex at L3. 2. Multilevel degenerative intervertebral disc space narrowing, disc desiccation and reactive endplate sclerosis and osteophytosis, with facet disease as above. There is right-sided lateral recess stenosis at L1-2, L4-5, and L5-S1, with left lateral recess stenosis at L2-3 and L3-4. Moderate right foraminal stenosis at L4-5 and L5-S1. These stenoses are somewhat better  appreciated on recent MRI from 06/15/2015. This study will be used for surgical planning purposes. Electronically Signed By: Jeannine Boga M.D. On: 06/16/2015 03:27   Mr Lumbar Spine Wo Contrast  06/15/2015 CLINICAL DATA: Severe intractable back pain with right lumbar radiculopathy. Scoliosis. EXAM: MRI LUMBAR SPINE WITHOUT CONTRAST TECHNIQUE: Multiplanar, multisequence MR imaging of the lumbar spine was performed. No intravenous contrast was administered. COMPARISON: Lumbar spine radiographs 11/12/2014. FINDINGS: Normal signal is present in the conus medullaris which terminates at T12-L1. Severe rightward curvature of the lumbar spine is centered at L2-3. There is asymmetric left-sided endplate marrow change at L2-3 and right-sided endplate marrow change at L1-2. Vertebral body heights maintained. There is slight retrolisthesis at L2-3. AP alignment is otherwise anatomic. Limited imaging of the abdomen demonstrates a benign cyst in the right kidney posteriorly. No other focal lesions are evident. T12-L1: Negative. L1-2: A broad-based disc protrusion is present. There is mild facet hypertrophy bilaterally. Mild foraminal narrowing is present on the right. L2-3: A broad-based disc protrusion is asymmetric to the left. This results in moderate left subarticular stenosis. Asymmetric left-sided facet hypertrophy contributes. Moderate to severe left foraminal narrowing is present. There is mild right foraminal stenosis. L3-4: A broad-based disc protrusion is asymmetric to the left. Mild to moderate left subarticular narrowing is present. Moderate left and mild right foraminal narrowing is present as well. L4-5: Moderate facet hypertrophy is present bilaterally. There is a broad-based disc protrusion. This results in mild to moderate subarticular and foraminal narrowing bilaterally, right greater than left. L5-S1: Asymmetric moderate facet hypertrophy is present. The central canal is patent. Moderate  right foraminal stenosis is present. IMPRESSION: 1. Severe dextro convex scoliosis is centered at L2-3. 2. Mild foraminal narrowing on the right at L1-2. 3. Moderate left subarticular and moderate to severe left foraminal narrowing at L2-3. 4. Mild to moderate left subarticular stenosis at L3-4. 5. Moderate left and mild right foraminal narrowing at L3-4. 6. Mild to moderate subarticular and foraminal narrowing bilaterally at L4-5 is worse on the right. 7. Moderate right foraminal stenosis at L5-S1. Electronically Signed By: San Morelle M.D. On: 06/15/2015 15:32   Dg C-arm Gt 120 Min  06/16/2015 CLINICAL DATA: T12-S1 lumbar fusion EXAM: DG C-ARM GT 120 MIN; LUMBAR SPINE - 2-3 VIEW FLUOROSCOPY TIME: Fluoroscopy Time (in minutes and seconds): 1 minutes 15 seconds COMPARISON: Lumbar spine CT dated 06/16/2015 FINDINGS: Intraoperative fluoroscopic images during T12-S1 posterior lumbar fusion. Two frontal and three lateral images were obtained, demonstrating surgical hardware in  satisfactory position. IMPRESSION: Intraoperative fluoroscopic images, as above. Electronically Signed By: Julian Hy M.D. On: 06/16/2015 21:32     Assessment/Plan: Diagnosis: Progressive Kyphoscoliosis with lumbar radiculopathy  Labs and images independently reviewed. Records reviewed and summated above.  1. Does the need for close, 24 hr/day medical supervision in concert with the patient's rehab needs make it unreasonable for this patient to be served in a less intensive setting? Yes  2. Co-Morbidities requiring supervision/potential complications: lethargy (wean sedating meds, encourage sleepy hygiene), hypotension (monitor BP with increased activity), HTN (monitor and provide prns in accordance with increased physical exertion and pain), fibromyalgia (ensure pain does not limit functional progress), chronic and post-op pain (Biofeedback training with therapies to help reduce reliance on opiate pain  medications, monitor pain control during therapies, and sedation at rest and titrate to maximum efficacy to ensure participation and gains in therapies), respiratory depression (wean respiratory depressing meds as tolerated), ABLA (transfuse if necessary to ensure appropriate perfusion for increased activity tolerance), leukocytosis (prior to steroids - cont to monitor for signs and symptoms of infection, further workup if indicated), hypomagnesemia (cont to monitor and replete as necessary) 3. Due to bladder management, safety, skin/wound care, disease management, medication administration, pain management and patient education, does the patient require 24 hr/day rehab nursing? Yes 4. Does the patient require coordinated care of a physician, rehab nurse, PT (1-2 hrs/day, 5 days/week) and OT (1-2 hrs/day, 5 days/week) to address physical and functional deficits in the context of the above medical diagnosis(es)? Yes Addressing deficits in the following areas: balance, endurance, locomotion, strength, transferring, bathing, dressing, grooming, toileting and psychosocial support 5. Can the patient actively participate in an intensive therapy program of at least 3 hrs of therapy per day at least 5 days per week? Not at present 6. The potential for patient to make measurable gains while on inpatient rehab is excellent 7. Anticipated functional outcomes upon discharge from inpatient rehab are min assist and mod assist with PT, min assist and mod assist with OT, n/a with SLP. 8. Estimated rehab length of stay to reach the above functional goals is: 18-22 days. 9. Does the patient have adequate social supports and living environment to accommodate these discharge functional goals? No 10. Anticipated D/C setting: Other 11. Anticipated post D/C treatments: HH therapy and Home excercise program 12. Overall Rehab/Functional Prognosis: excellent  RECOMMENDATIONS: This patient's condition is appropriate for  continued rehabilitative care in the following setting: Based on pt's current level of functioning, it is unlikely she will be able to obtain an independent level of functioning after a short IRF stay. Further, she is unable to tolerate 3 hours therapy/day. Based on current status, SNF would be more appropriate discharge disposition. Will cont to follow once more medically stable. Patient has agreed to participate in recommended program. Potentially Note that insurance prior authorization may be required for reimbursement for recommended care.  Comment: Rehab Admissions Coordinator to follow up.  Delice Lesch, MD 06/17/2015       Revision History     Date/Time User Provider Type Action   06/17/2015 4:33 PM Ankit Lorie Phenix, MD Physician Sign   06/17/2015 2:58 PM Cathlyn Parsons, PA-C Physician Assistant Pend   06/17/2015 2:45 PM Bary Leriche, PA-C Physician Assistant Pend   View Details Report       Routing History     Date/Time From To Method   06/17/2015 4:33 PM Ankit Lorie Phenix, MD Ankit Lorie Phenix, MD In [facility]   06/17/2015 4:33  PM Ankit Lorie Phenix, MD Lorne Skeens, MD Fax

## 2015-06-22 NOTE — H&P (Signed)
Physical Medicine and Rehabilitation Admission H&P    CC:  Progressive degenerative lumbar scoliosis    HPI:  Rita Gregory is a 70 y.o. female with history for HTN, fibromyalgia, chronic pain, kyphoscoliosis with increase in curve to 40 degrees who was admitted with intractible right buttock and back pain.and inability to ambulate on 06/12/15. Patient lives with her husband in [place], [place]. Her husband is a practicing physician for [employer]. She does not have 24/7 support at discharge. Two-level home with bedroom downstairs. She used a walker prior to admission however the past month she has been very sedentary. No local family to assist. She was admitted for pain management and required sedation for MRI which revealed progression in scoliotic stenosis on right with L4-5 and L5-S1 and L2/3 and L3/4 on left. She elected to undergo two stage procedure--anterolateral decompression with fusion from L1 to L4 followed by posterior decompression of right side L4 and L5 nerve roots with fusion of L4/5 and segmental fixation from T12 to illium with pedicle screws and rods on 06/16/15. Post op had issue with hypotension and lethargy and PCCM recommended  pulmicort with limitation of narcotics to avoid sedation as well as IVF for BP support. affecting mobility. As blood pressures stabilized narcotics resumed and have been titrated to help with pain management. BP medications d/c and she was transfused with 2 units PRBC for drop in H/H to 6.2/19.1.  Hyperglycemia due to steroids improving and SSI d/c per input with husband.  Therapy ongoing and patient continues to be limited by pain, BLE weakness, poor safety awareness and ataxic gait.     Review of Systems  HENT: Negative for hearing loss.   Eyes: Negative for blurred vision and double vision.  Respiratory: Negative for cough and shortness of breath.   Cardiovascular: Negative for chest pain and palpitations.    Gastrointestinal: Negative for heartburn, nausea and vomiting.  Genitourinary: Positive for frequency. Negative for dysuria and urgency.  Musculoskeletal: Positive for back pain. Negative for myalgias.  Neurological: Negative for dizziness, tingling, sensory change, speech change, focal weakness and headaches.  Psychiatric/Behavioral: The patient has insomnia.   All other systems reviewed and are negative.     Past Medical History  Diagnosis Date  . Diverticulosis of colon (without mention of hemorrhage) 11/09/2007  . Colon polyp 11/09/2007    Hyperplastic   . Esophageal reflux   . Fibromyalgia   . Hypertension   . Kyphoscoliosis     40 degree bend ro right side  . Chronic pain     Dr. Ellene Route follows  . Patent foramen ovale     hx.- Dr. Lemmie Evens. Smith,cardiology- follows  . Migraine   . Paroxysmal spells ([place]) 02/13/2013    Past Surgical History  Procedure Laterality Date  . Appendectomy    . Cholecystectomy    . Abdominal hysterectomy    . Joint replacement      RTKA '05  . Knee arthroscopy w/ acl reconstruction      Rt. Knee  . Dry eyes      uses Restasis daily  . Cesarean section      x2  . Right rotator cuff  2011    sx  . Esophagogastroduodenoscopy  02/23/2012    Procedure: ESOPHAGOGASTRODUODENOSCOPY (EGD);  Surgeon: Irene Shipper, MD;  Location: [facility];  Service: Endoscopy;  Laterality: N/A;  . Radiology with anesthesia N/A 06/15/2015    Procedure: RADIOLOGY WITH ANESTHESIA;  Surgeon: Medication Radiologist, MD;  Location: [place];  Service: Radiology;  Laterality: N/A;  . Anterior lateral lumbar fusion 4 levels Left 06/16/2015    Procedure: ANTERIOR LATERAL LUMBAR FUSION THORACIC TWELVE-LUMBAR FOUR;  Surgeon: Kristeen Miss, MD;  Location: [place];  Service: Neurosurgery;  Laterality: Left;  Thoracolumbar spine  . Posterior lumbar fusion 4 level N/A 06/16/2015    Procedure: POSTERIOR LUMBAR FUSION LUMBAR FOUR-FIVE LUMBAR FIVE-SACRAL ONE;  Surgeon: Kristeen Miss, MD;   Location: [place] [facility];  Service: Neurosurgery;  Laterality: N/A;    Family History  Problem Relation Age of Onset  . Ovarian cancer Mother     passed at age 44  . Heart disease Father   . Kidney disease Father     Kidney cancer  . Stomach cancer Paternal Uncle   . Diabetes type II Father     passed at 39 yrs old  . Colon cancer Father     cancereous polyps  . Bipolar disorder Sister     Osteopenia  . Bipolar disorder Son     Social History:  Married--was ambulating with a cane PTA. Husband is full time physician. reports that she has been passively smoking. She does not have any smokeless tobacco history on file. She reports that she drinks about 0.5 - 1.5 oz of alcohol per week. She reports that she does not use illicit drugs.   Allergies  Allergen Reactions  . Fentanyl Shortness Of Breath    Pt has tolerated this medication 06/2015 pre and during operation. MD ok giving.  . Morphine And Related Anaphylaxis    respiratory depression   . Bupropion Hcl     Dizzy, fog  . Codeine Nausea And Vomiting  . Demerol [Meperidine] Nausea And Vomiting  . Dextromethorphan Other (See Comments)    Unknown   . Guaifenesin & Derivatives     Other, unknown  . Lipitor [Atorvastatin]   . Lovaza [Omega-3-Acid Ethyl Esters] Diarrhea  . Lyrica [Pregabalin]     Fatigue and depression  . Naltrexone     depression  . Percodan [Oxycodone-Aspirin] Nausea And Vomiting  . Prilosec [Omeprazole]     Iliacs    . Topamax [Topiramate]     disorientation   . Valium [Diazepam]     Small doses are OK but large doses are not. Long-term use causes depression.   . Versed [Midazolam]     In moderate in large doses shortness of breath, and respiratory depression   . Adhesive [Tape] Rash    Skin irritation   . Concerta [Methylphenidate] Palpitations  . Lamotrigine Rash  . Nickel Rash    Skin irritation   . Oxycodone-Acetaminophen Nausea And Vomiting and Rash    depression  . Percocet  [Oxycodone-Acetaminophen] Nausea And Vomiting and Rash  . Tegaderm Ag Mesh [Silver] Rash    Medications Prior to Admission  Medication Sig Dispense Refill  . acetaminophen (TYLENOL) 650 MG CR tablet Take 650 mg by mouth every 8 (eight) hours as needed for pain.    Marland Kitchen albuterol (PROVENTIL HFA;VENTOLIN HFA) 108 (90 BASE) MCG/ACT inhaler Inhale 2 puffs into the lungs every 6 (six) hours as needed. For shortness of breath    . Choline Fenofibrate (TRILIPIX) 135 MG capsule Take 135 mg by mouth at bedtime.    . cycloSPORINE (RESTASIS) 0.05 % ophthalmic emulsion Place 1 drop into both eyes 2 (two) times daily.    Marland Kitchen EPINEPHrine (EPIPEN 2-PAK) 0.3 mg/0.3 mL DEVI Inject 0.3 mg into the muscle once. To medications, touch, or  inhaltions    . escitalopram (LEXAPRO) 10 MG tablet Take 10 mg by mouth 2 (two) times daily.    Marland Kitchen estrogens, conjugated, (PREMARIN) 0.3 MG tablet Take 0.3 mg by mouth daily.    Marland Kitchen gabapentin (NEURONTIN) 100 MG capsule Take 1 capsule (100 mg total) by mouth 2 (two) times daily. 60 capsule 3  . HYDROcodone-acetaminophen (NORCO/VICODIN) 5-325 MG tablet Take 1-2 tablets by mouth every 6 (six) hours as needed. pain  0  . ketoconazole (NIZORAL) 2 % cream Apply 1 application topically 2 (two) times daily as needed for irritation.     Marland Kitchen L-Methylfolate-Algae (DEPLIN 15) 15-90.314 MG CAPS Take 1 capsule by mouth daily.  1  . lansoprazole (PREVACID) 30 MG capsule Take 30 mg by mouth 2 (two) times daily before a meal.    . levothyroxine (SYNTHROID, LEVOTHROID) 25 MCG tablet Take 25 mcg by mouth at bedtime.    Marland Kitchen lisinopril (PRINIVIL,ZESTRIL) 2.5 MG tablet Take 1.25 mg by mouth every morning.     . methylphenidate (DAYTRANA) 10 mg/9hr Place 1 patch onto the skin daily as needed (15mg  daily/10 mg as a prn). Take if extra if needed for ADHD. Wear patch for 9 hours only each day    . methylphenidate (DAYTRANA) 15 mg/9hr Place 1 patch onto the skin daily. wear patch for 9 hours only each day    .  pravastatin (PRAVACHOL) 40 MG tablet Take 40 mg by mouth at bedtime.    . pyridOXINE (VITAMIN B-6) 100 MG tablet Take 100 mg by mouth daily.     . ranitidine (ZANTAC) 150 MG capsule Take 150 mg by mouth 2 (two) times daily as needed. For GERD    . testosterone (ANDROGEL) 50 MG/5GM GEL Place 5 g onto the skin See admin instructions. Three times weekly as needed for hormone replacement    . traZODone (DESYREL) 50 MG tablet Take 50 mg by mouth at bedtime.       Home: Home Living Family/patient expects to be discharged to:: Inpatient rehab Living Arrangements: Spouse/significant other Additional Comments: spouse is MD and is currently working full time    Functional History: Prior Function Level of Independence: Needs assistance Gait / Transfers Assistance Needed: Ambulates with a cane until pain became worse PTA.  Functional Status:  Mobility: Bed Mobility Overal bed mobility: Needs Assistance Bed Mobility: Rolling, Sidelying to Sit Rolling: Supervision Sidelying to sit: Supervision Sit to sidelying: Mod assist General bed mobility comments: Supervision for safety with use of rail to rise to EOB. Did not require cues to perform this date Transfers Overall transfer level: Needs assistance Equipment used: Rolling walker (2 wheeled) Transfers: Sit to/from Stand Sit to Stand: Min guard Stand pivot transfers: Min assist General transfer comment: Min guard for safety. Slight posterior lean. Practiced from bed and recliner. VC for hand placement. Ambulation/Gait Ambulation/Gait assistance: Min assist Ambulation Distance (Feet): 100 Feet Assistive device: Rolling walker (2 wheeled) Gait Pattern/deviations: Step-through pattern, Decreased stride length, Scissoring, Ataxic, Staggering left, Staggering right, Drifts right/left General Gait Details: Min assist intermittently for balance. Frequent VC for attention to gait mechanics. Grossly uncoordinated, no buckling. VC for walker placement  for support. Easily distracted. Good backwards steps. Gait velocity: decreased Gait velocity interpretation: Below normal speed for age/gender    ADL: ADL Overall ADL's : Needs assistance/impaired Eating/Feeding: Maximal assistance Eating/Feeding Details (indicate cue type and reason): Pt and spouse report she fed herself ~40% of meal today.  Grooming: Maximal assistance, Bed level Grooming Details (indicate cue type  and reason): Pt requires max encouragement to use bil. UEs  Upper Body Bathing: Total assistance, Bed level Lower Body Bathing: Total assistance, Bed level Upper Body Dressing : Total assistance, Bed level Lower Body Dressing: Total assistance, Bed level Toilet Transfer: Total assistance Toilet Transfer Details (indicate cue type and reason): unable to assess due to orthostasis Toileting- Clothing Manipulation and Hygiene: Total assistance, Bed level Functional mobility during ADLs: Maximal assistance, +2 for physical assistance General ADL Comments: Pt moves slowly, requires encouragement to fully participate as she is very passive and tentative with her movement   Cognition: Cognition Overall Cognitive Status: Impaired/Different from baseline Orientation Level: Oriented X4 Cognition Arousal/Alertness: Awake/alert Behavior During Therapy: WFL for tasks assessed/performed Overall Cognitive Status: Impaired/Different from baseline Area of Impairment: Attention, Memory, Following commands, Safety/judgement, Problem solving Current Attention Level: Sustained Memory: Decreased recall of precautions, Decreased short-term memory Following Commands: Follows one step commands consistently Safety/Judgement: Decreased awareness of safety, Decreased awareness of deficits Problem Solving: Difficulty sequencing, Requires verbal cues, Requires tactile cues  Physical Exam: Blood pressure 104/72, pulse 71, temperature 97.9 F (36.6 C), temperature source Oral, resp. rate 16,  height 5\' 4"  (1.626 m), weight 72.4 kg (159 lb 9.8 oz), SpO2 96 %. Physical Exam  Nursing note and vitals reviewed. Constitutional: She is oriented to person, place, and time. She appears well-developed and well-nourished.  HENT:  Head: Normocephalic and atraumatic.  Mouth/Throat: Oropharynx is clear and moist.  Eyes: Conjunctivae and EOM are normal. No scleral icterus.  Neck: Normal range of motion. Neck supple.  Cardiovascular: Normal rate and regular rhythm.   No murmur heard. Respiratory: Effort normal and breath sounds normal. No stridor. No respiratory distress. She exhibits no tenderness.  GI: Soft. Bowel sounds are normal. She exhibits no distension. There is no tenderness.  Musculoskeletal: She exhibits no edema or tenderness.  Right lower extremity (shorter) leg length discrepancy   Neurological: She is alert and oriented to person, place, and time.  Motor: Bilateral upper extremities: 4+/5 proximal distal Bilateral lower extremities: Flexion 4/5, knee extension 4+/5, ankle dorsi/plantar flexion 4+/5 Sensation intact to light touch DTRs symmetric  Skin: Skin is warm and dry.  Psychiatric: She has a normal mood and affect. Her behavior is normal. Thought content normal.    No results found for this or any previous visit (from the past 48 hour(s)). Dg Lumbar Spine 2-3 Views  06/20/2015  CLINICAL DATA:  70 year old female with history of back surgery on 06/16/2015. Back pain. EXAM: LUMBAR SPINE - 2-3 VIEW COMPARISON:  Multiple priors, most recently 06/16/2015. FINDINGS: Postoperative changes of PLIF are noted, which appear to extend from T12 to S2, likely involving bilateral sacroiliac joint arthrodesis. Dextroscoliosis on the standing frontal projection, convex to the right at the level of L3. This appears less severe than the preoperative study. Interbody grafts are noted at L1-L2, L2-L3, L3-L4 and L4-L5. IMPRESSION: 1. Postoperative changes of PLIF and bilateral sacroiliac joint  arthrodesis, as above. Electronically Signed   By: Vinnie Langton M.D.   On: 06/20/2015 13:31    Medical Problem List and Plan: 1.  BLE weakness with Ataxic gait, poor safety awareness and back pain secondary to Progressive Kyphoscoliosis with lumbar radiculopathy s/p anterolateral decompression with fusion from L1 to L4 and posterior decompression of right side L4 and L5 nerve roots with fusion of L4/5 and segmental fixation from T12 to illium. 2.  DVT Prophylaxis/Anticoagulation: Pharmaceutical: Lovenox 3. Chronic pain/Pain Management: On neurontin bid.  4. Mood: LCSW to follow for  evaluation and support.  5. Neuropsych: This patient is capable of making decisions on her own behalf. 6. Skin/Wound Care: Monitor wound for healing. Encourage po intake. Add nutritional supplements 7. Fluids/Electrolytes/Nutrition:  Monitor I/O. Check lytes in am. 8. Leucocytosis: Follow up CBC in am. Will check UA/UCS. 9. ABLA with  Iron deficiency:  Continue iron supplement 10. Fibromyalgia: On lexapro.  11. GERD: On pepcid and protonix with reasonable control  12. Constipation: Continue Senna S. Willing to try enema tomorrow if still no results.  13. GU: foley in place per patient preference. Will check UCS. Foley to be removed--start bladder training.    Post Admission Physician Evaluation: 1. Functional deficits secondary  to to Progressive Kyphoscoliosis with lumbar radiculopathy s/p anterolateral decompression with fusion from L1 to L4 and posterior decompression of right side L4 and L5 nerve roots with fusion of L4/5 and segmental fixation from T12 to illium. 2. Patient is admitted to receive collaborative, interdisciplinary care between the physiatrist, rehab nursing staff, and therapy team. 3. Patient's level of medical complexity and substantial therapy needs in context of that medical necessity cannot be provided at a lesser intensity of care such as a SNF. 4. Patient has experienced substantial  functional loss from his/her baseline which was documented above under the "Functional History" and "Functional Status" headings.  Judging by the patient's diagnosis, physical exam, and functional history, the patient has potential for functional progress which will result in measurable gains while on inpatient rehab.  These gains will be of substantial and practical use upon discharge  in facilitating mobility and self-care at the household level. 5. Physiatrist will provide 24 hour management of medical needs as well as oversight of the therapy plan/treatment and provide guidance as appropriate regarding the interaction of the two. 6. 24 hour rehab nursing will assist with safety, skin/wound care, disease management, medication administration, pain management and patient education  and help integrate therapy concepts, techniques,education, etc. 7. PT will assess and treat for/with: Lower extremity strength, range of motion, stamina, balance, functional mobility, safety, adaptive techniques and equipment, woundcare, coping skills, pain control, education.   Goals are: Mod I. 8. OT will assess and treat for/with: ADL's, functional mobility, safety, upper extremity strength, adaptive techniques and equipment, wound mgt, ego support, and community reintegration.   Goals are: Mod I. Therapy may not proceed with showering this patient. 9. Case Management and Social Worker will assess and treat for psychological issues and discharge planning. 10. Team conference will be held weekly to assess progress toward goals and to determine barriers to discharge. 11. Patient will receive at least 3 hours of therapy per day at least 5 days per week. 12. ELOS: 14-18 days.       13. Prognosis:  excellent  Delice Lesch, MD 06/22/2015

## 2015-06-22 NOTE — Progress Notes (Signed)
Patient is discharged from room 5C02 and transferred to unit 4W13 at this time. Alert and in stable condition. IV site d/c'd. Report given to receiving nurse Ed, RN. Transported via be with all belongings at side.

## 2015-06-22 NOTE — Progress Notes (Signed)
PMR Admission Coordinator Pre-Admission Assessment  Patient: Rita Gregory is an 70 y.o., female MRN: [id number] DOB: 17-May-1945 Height: 5\' 4"  (162.6 cm) Weight: 72.4 kg (159 lb 9.8 oz)  Insurance Information HMO: PPO: yes PCP: IPA: 80/20: OTHER: Pt's spouse is employee PRIMARY: Janit Bern Policy#: [id number] Subscriber: spouse CM Name: [name] Phone#: [phone] ext [phone] Fax#: n/a No follow up needed as pt is [facility] UMR Pre-Cert#: not needed due to employee UMR Employer: [employer] Benefits: Phone #: [phone] Name: 06/19/15 Eff. Date: 02/09/14 Deduct: none Out of Pocket Max: [phone] Life Max: none CIR: $500 copay per admit then covers 80% SNF: 80% 120 days per calender year Outpatient: $20 copay 24 visits combined per year  Home Health: 80% Co-Pay: 20% DME: 80% Co-Pay: 20% Providers: in network  SECONDARY: Medicare A and B Policy#: [id number] Subscriber: pt THIRD: Tricare for Life Policy: [id number] Subscriber: spouse  Medicaid Application Date: Case Manager:  Disability Application Date: Case Worker:   Emergency [place]    Name Relation Home Work Mobile   [place] Spouse [phone]     Current Medical History  Patient Admitting Diagnosis: progressive Kyphoscoliosis with lumbar radiculopathy  History of Present Illness: Rita Gregory is a 70 y.o. female with history for HTN, fibromyalgia, chronic pain, kyphoscoliosis with increase in curve to 40 degrees who was admitted with intractible right buttock and back pain.and inability to ambulate on 06/12/15. Patient lives with her husband in [place], [place]. Her husband is a practicing  physician for [employer]. She was admitted for pain management and required sedation for MRI which revealed progression in scoliotic stenosis on right with L4-5 and L5-S1 and L2/3 and L3/4 on left. She elected to undergo two stage procedure--anterolateral decompression with fusion from L1 to L4 followed by posterior decompression of right side L4 and L5 nerve roots with fusion of L4/5 and segmental fixation from T12 to illium with pedicle screws and rods on 06/16/15. Post op had issue with hypotension and lethargy and PCCM recommended pulmicort with limitation of narcotics to avoid sedation as well as IVF for BP support. affecting mobility. As blood pressures stabilized narcotics resumed and have been titrated to help with pain management. BP medications d/c and she was transfused with 2 units PRBC for drop in H/H to 6.2/19.1. Hyperglycemia due to steroids improving and SSI d/c per input with husband. Therapy ongoing and patient continues to be limited by pain, BLE weakness, poor safety awareness and ataxic gait.   Past Medical History  Past Medical History  Diagnosis Date  . Diverticulosis of colon (without mention of hemorrhage) 11/09/2007  . Colon polyp 11/09/2007    Hyperplastic   . Esophageal reflux   . Fibromyalgia   . Hypertension   . Kyphoscoliosis     40 degree bend ro right side  . Chronic pain     Dr. Ellene Route follows  . Patent foramen ovale     hx.- Dr. Lemmie Evens. Smith,cardiology- follows  . Migraine   . Paroxysmal spells ([place]) 02/13/2013    Family History  family history includes Bipolar disorder in her sister and son; Colon cancer in her father; Diabetes type II in her father; Heart disease in her father; Kidney disease in her father; Ovarian cancer in her mother; Stomach cancer in her paternal uncle.  Prior Rehab/Hospitalizations:  Has the patient had major surgery during 100 days prior to admission? No  Current Medications    Current facility-administered medications:  . 0.9 %  sodium chloride infusion, 250 mL, Intravenous, Continuous, Kristeen Miss, MD, 250 mL at 06/16/15 2351 . acetaminophen (TYLENOL) tablet 650 mg, 650 mg, Oral, Q4H PRN **OR** acetaminophen (TYLENOL) suppository 650 mg, 650 mg, Rectal, Q4H PRN, Kristeen Miss, MD . albuterol (PROVENTIL) (2.5 MG/3ML) 0.083% nebulizer solution 3 mL, 3 mL, Inhalation, Q6H PRN, Kristeen Miss, MD . alum & mag hydroxide-simeth (MAALOX/MYLANTA) 200-200-20 MG/5ML suspension 30 mL, 30 mL, Oral, Q6H PRN, Kristeen Miss, MD . antiseptic oral rinse (CPC / CETYLPYRIDINIUM CHLORIDE 0.05%) solution 7 mL, 7 mL, Mouth Rinse, BID, Kristeen Miss, MD, 7 mL at 06/22/15 0913 . bisacodyl (DULCOLAX) suppository 10 mg, 10 mg, Rectal, Daily PRN, Kristeen Miss, MD . budesonide (PULMICORT) nebulizer solution 0.5 mg, 0.5 mg, Nebulization, BID, Jose Angelo A Mildred, MD, 0.5 mg at 06/21/15 2040 . cycloSPORINE (RESTASIS) 0.05 % ophthalmic emulsion 1 drop, 1 drop, Both Eyes, BID, Kristeen Miss, MD, 1 drop at 06/22/15 0911 . dexamethasone (DECADRON) tablet 2 mg, 2 mg, Oral, Q12H, Kristeen Miss, MD, 2 mg at 06/22/15 0911 . diazepam (VALIUM) tablet 2 mg, 2 mg, Oral, Q6H PRN, Ashok Pall, MD, 2 mg at 06/22/15 0526 . docusate sodium (COLACE) capsule 100 mg, 100 mg, Oral, BID, Kristeen Miss, MD, 100 mg at 06/22/15 0911 . enoxaparin (LOVENOX) injection 40 mg, 40 mg, Subcutaneous, Q24H, Nishant Dhungel, MD, 40 mg at 06/21/15 1334 . escitalopram (LEXAPRO) tablet 10 mg, 10 mg, Oral, BID, Kristeen Miss, MD, 10 mg at 06/22/15 0911 . estrogens (conjugated) (PREMARIN) tablet 0.3 mg, 0.3 mg, Oral, Daily, Kristeen Miss, MD, 0.3 mg at 06/22/15 0912 . famotidine (PEPCID) tablet 20 mg, 20 mg, Oral, QHS, Ashok Pall, MD, 20 mg at 06/21/15 2142 . fenofibrate tablet 54 mg, 54 mg, Oral, Daily, Kristeen Miss, MD, 54 mg at 06/22/15 0912 . fentaNYL (SUBLIMAZE) injection 12.5-25 mcg, 12.5-25 mcg, Intravenous, Q1H  PRN, Earnie Larsson, MD, 12.5 mcg at 06/21/15 0716 . ferrous gluconate (FERGON) tablet 324 mg, 324 mg, Oral, BID WC, Nishant Dhungel, MD, 324 mg at 06/22/15 0900 . gabapentin (NEURONTIN) capsule 100 mg, 100 mg, Oral, BID, Kristeen Miss, MD, 100 mg at 06/22/15 0911 . ketoconazole (NIZORAL) 2 % cream 1 application, 1 application, Topical, BID, Ashok Pall, MD, 1 application at [id number] 0912 . levothyroxine (SYNTHROID, LEVOTHROID) tablet 25 mcg, 25 mcg, Oral, QHS, Kristeen Miss, MD, 25 mcg at 06/21/15 2142 . magnesium citrate solution 1 Bottle, 1 Bottle, Oral, Once PRN, Kristeen Miss, MD . menthol-cetylpyridinium (CEPACOL) lozenge 3 mg, 1 lozenge, Oral, PRN **OR** phenol (CHLORASEPTIC) mouth spray 1 spray, 1 spray, Mouth/Throat, PRN, Kristeen Miss, MD . methocarbamol (ROBAXIN) tablet 500 mg, 500 mg, Oral, Q6H PRN, 500 mg at 06/22/15 0911 **OR** methocarbamol (ROBAXIN) 500 mg in dextrose 5 % 50 mL IVPB, 500 mg, Intravenous, Q6H PRN, Kristeen Miss, MD, 500 mg at 06/17/15 0016 . ondansetron (ZOFRAN) injection 4 mg, 4 mg, Intravenous, Q4H PRN, Kristeen Miss, MD . oxyCODONE (Oxy IR/ROXICODONE) immediate release tablet 10 mg, 10 mg, Oral, Q3H PRN, Kary Kos, MD, 10 mg at 06/22/15 0911 . pantoprazole (PROTONIX) EC tablet 20 mg, 20 mg, Oral, Daily, Kristeen Miss, MD, 20 mg at 06/22/15 0912 . polyethylene glycol (MIRALAX / GLYCOLAX) packet 17 g, 17 g, Oral, Daily PRN, Kristeen Miss, MD . pravastatin (PRAVACHOL) tablet 40 mg, 40 mg, Oral, QHS, Ashok Pall, MD, 40 mg at 06/21/15 2142 . pyridOXINE (VITAMIN B-6) tablet 200 mg, 200 mg, Oral, QODAY, Ashok Pall, MD, 200 mg at 06/21/15 1008 . senna (SENOKOT) tablet 8.6 mg, 1 tablet, Oral,  BID, Kristeen Miss, MD, 8.6 mg at 06/22/15 0911 . sodium chloride flush (NS) 0.9 % injection 3 mL, 3 mL, Intravenous, Q12H, Kristeen Miss, MD, 3 mL at 06/22/15 0913 . sodium chloride flush (NS) 0.9 % injection 3 mL, 3 mL, Intravenous, PRN, Kristeen Miss, MD . sodium phosphate  (FLEET) 7-19 GM/118ML enema 1 enema, 1 enema, Rectal, Once PRN, Kristeen Miss, MD . traZODone (DESYREL) tablet 50 mg, 50 mg, Oral, QHS, Kristeen Miss, MD, 50 mg at 06/21/15 2141  Patients Current Diet: Diet regular Room service appropriate?: Yes; Fluid consistency:: Thin Diet - low sodium heart healthy  Precautions / Restrictions Precautions Precautions: Fall, Back Precaution Booklet Issued: No Precaution Comments: Reviewed precautions Spinal Brace: Applied in sitting position, Other (comment) Spinal Brace Comments: spinal brace in room, no orders for one, donned EOB Restrictions Weight Bearing Restrictions: No   Has the patient had 2 or more falls or a fall with injury in the past year?No  Prior Activity Level Community (5-7x/wk): retired June 2015. Driving and used cane pta  Blanket / Winger Devices/Equipment: Cane (specify quad or straight)  Prior Device Use: Indicate devices/aids used by the patient prior to current illness, exacerbation or injury? cane pta  Prior Functional Level Prior Function Level of Independence: Independent with assistive device(s) Gait / Transfers Assistance Needed: Ambulates with a cane until pain became worse PTA. 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 Sedentary over past month. Was driving.  Self Care: Did the patient need help bathing, dressing, using the toilet or eating? Needed some help  Indoor Mobility: Did the patient need assistance with walking from room to room (with or without device)? Independent  Stairs: Did the patient need assistance with internal or external stairs (with or without device)? Needed some help  Functional Cognition: Did the patient need help planning regular tasks such as shopping or remembering to take medications? Needed some help  Current Functional Level Cognition  Overall Cognitive Status: Impaired/Different from baseline Current Attention Level:  Alternating Orientation Level: Oriented X4 Following Commands: Follows one step commands consistently Safety/Judgement: Decreased awareness of safety, Decreased awareness of deficits   Extremity Assessment (includes Sensation/Coordination)  Upper Extremity Assessment: Defer to OT evaluation  Lower Extremity Assessment: Generalized weakness, RLE deficits/detail, LLE deficits/detail RLE Deficits / Details: Minimal active movement noted, which pt indicates is due to pain in back. Sensation intact. RLE Coordination: decreased fine motor, decreased gross motor LLE Deficits / Details: Minmal Active movement noted, which pt indicates is due to pain. Sensation intact. LLE Coordination: decreased fine motor, decreased gross motor    ADLs  Overall ADL's : Needs assistance/impaired Eating/Feeding: Maximal assistance Eating/Feeding Details (indicate cue type and reason): Pt and spouse report she fed herself ~40% of meal today.  Grooming: Maximal assistance, Bed level Grooming Details (indicate cue type and reason): Pt requires max encouragement to use bil. UEs  Upper Body Bathing: Total assistance, Bed level Lower Body Bathing: Total assistance, Bed level Upper Body Dressing : Total assistance, Bed level Lower Body Dressing: Total assistance, Bed level Toilet Transfer: Total assistance Toilet Transfer Details (indicate cue type and reason): unable to assess due to orthostasis Toileting- Clothing Manipulation and Hygiene: Total assistance, Bed level Functional mobility during ADLs: Maximal assistance, +2 for physical assistance General ADL Comments: Pt moves slowly, requires encouragement to fully participate as she is very passive and tentative with her movement     Mobility  Overal bed mobility: Needs Assistance Bed Mobility: Rolling, Sidelying to  Sit Rolling: Supervision Sidelying to sit: Supervision Sit to sidelying: Mod assist General bed mobility comments: Required cues  today to maintain back precautions, attempted to flex forward.    Transfers  Overall transfer level: Needs assistance Equipment used: Rolling walker (2 wheeled) Transfers: Sit to/from Stand Sit to Stand: Min assist Stand pivot transfers: Min assist General transfer comment: Min assist for balance. Good boost and power up to standing x2 from bed however loss of balance towards posterior requiring min assist to correct with cues to hold RW once standing    Ambulation / Gait / Stairs / Wheelchair Mobility  Ambulation/Gait Ambulation/Gait assistance: Museum/gallery curator (Feet): 115 Feet Assistive device: Rolling walker (2 wheeled) Gait Pattern/deviations: Step-through pattern, Decreased stride length, Scissoring, Ataxic, Staggering left, Staggering right, Drifts right/left General Gait Details: Focused on symmetry of gait and coordination of LEs. Intermittent min assist for loss of balance and staggering. VC to maintain UE support on RW. Scissoring with forward and backwards stepping, cues to widen base of support. Cues for safety awareness. Gait velocity: decreased Gait velocity interpretation: Below normal speed for age/gender    Posture / Balance Dynamic Sitting Balance Sitting balance - Comments: min guard to supervision in sitting due to posterior preference Balance Overall balance assessment: Needs assistance Sitting-balance support: Feet supported, Bilateral upper extremity supported Sitting balance-Leahy Scale: Poor Sitting balance - Comments: min guard to supervision in sitting due to posterior preference Postural control: Posterior lean Standing balance support: Bilateral upper extremity supported Standing balance-Leahy Scale: Poor    Special needs/care consideration Skin surgical incision Bowel mgmt:LBM 3/11 continent Bladder mgmt: catheter    Previous Home Environment Living Arrangements: Spouse/significant other Lives With: Spouse Available  Help at Discharge: Available 24 hours/day, Friend(s) (friends or hired Dentist ) Type of Home: House Home Layout: Two level, Full bath on main level, Able to live on main level with bedroom/bathroom (basement then two level living area of home.Typical bedroom ) Alternate Level Stairs-Rails: Right, Left, Can reach both Alternate Level Stairs-Number of Steps: flight Home Access: Stairs to enter Entrance Stairs-Rails: Right, Left Entrance Stairs-Number of Steps: 2 Bathroom Shower/Tub: Multimedia programmer: Standard Bathroom Accessibility: Yes How Accessible: Accessible via walker Home Care Services: No Additional Comments: spouse is MD and is currently working full time   Discharge Living Setting Plans for Discharge Living Setting: Patient's home, Lives with (comment) (spouse) Type of Home at Discharge: House Discharge Home Layout: Full bath on main level, Able to live on main level with bedroom/bathroom (two level and a basement.typical bed.bath upstairs but can s) Discharge Home Access: Stairs to enter Entrance Stairs-Rails: Right, Left Entrance Stairs-Number of Steps: 2 Discharge Bathroom Shower/Tub: Walk-in shower Discharge Bathroom Toilet: Standard Discharge Bathroom Accessibility: Yes How Accessible: Accessible via walker Does the patient have any problems obtaining your medications?: No  Social/Family/Support Systems Patient Roles: Spouse, Parent (adult children out of state) Contact Information: Dr. Tobe Sos, spouse Anticipated Caregiver: spouse at night and friends or hired assist during the day Anticipated Caregiver's Contact Information: see above Ability/Limitations of Caregiver: spouse works fulltime as Arts administrator Availability: 24/7 Discharge Plan Discussed with Primary Caregiver: Yes Is Caregiver In Agreement with Plan?: Yes Does Caregiver/Family have Issues with Lodging/Transportation while Pt is in Rehab?: No Spouse states he  will arrange for friends or hire assist 24/7 to provide supervision when he is not at home and working. He is aware that medical insurance will not cover that superivision to minimum assistance.  Goals/Additional Needs Patient/Family  Goal for Rehab: supervision to min assist with PT and OT Expected length of stay: [facility] 10-14 days Equipment Needs: Pt wears orthotics PTA. SHe states she has a 1 1/4" descrepancy in right and left leg Pt/Family Agrees to Admission and willing to participate: Yes  Decrease burden of Care through IP rehab admission: n/a  Possible need for SNF placement upon discharge:not anticipated, but spouse has asked if I feel pt will be able to go home with hired assist and friends or if further rehab will be needed at [facility].  Patient Condition: This patient's medical and functional status has changed since the consult dated: 06/17/2015 in which the Rehabilitation Physician determined and documented that the patient's condition is appropriate for intensive rehabilitative care in an inpatient rehabilitation facility. See "History of Present Illness" (above) for medical update. Functional changes are: min to mod assist. Patient's medical and functional status update has been discussed with the Rehabilitation physician and patient remains appropriate for inpatient rehabilitation. Will admit to inpatient rehab today.  Preadmission Screen Completed By: Cleatrice Burke, 06/22/2015 11:57 AM ______________________________________________________________________  Discussed status with Dr. Posey Pronto on 06/22/2015 at 1157 and received telephone approval for admission today.  Admission Coordinator: Cleatrice Burke, time [id number] Date 06/22/2015.          Cosigned by: Ankit Lorie Phenix, MD at 06/22/2015 12:10 PM  Revision History     Date/Time User Provider Type Action   06/22/2015 12:10 PM Ankit Lorie Phenix, MD Physician Cosign   06/22/2015 11:57 AM Cristina Gong, RN Rehab  Admission Coordinator Sign

## 2015-06-22 NOTE — Progress Notes (Signed)
Physical Therapy Treatment Patient Details Name: KINAYA MICHAELIS MRN: [id number] DOB: 1945/11/07 Today's Date: 06/22/2015    History of Present Illness This 70 y.o. female admitted for intractable back, hip and leg pain.  She underwent Anterolateral decompression and fusion L1-2, L2-3, L3-4, x lift spacer.  Stage II posterior decompression or Rt sided L4 and L5 nerve roots with transforaminal interbody fusion L4-5 on Rt.  Segmental fixation from T12 to ilium with pedicle screws and rods, posterior lateral arthodesis from T12 to sacram with local autograft.  PMH includes:  degenerative scoliosis lumbar spine, diverticulosis of colon, fibromyalgia, HTN, PFO, chronic pain, migraine, parozysmal spells, s/p appendectomy, cholycystectomy, Rt TKA '05, Rt rotator cuff repair     PT Comments    Continues to make progress towards functional goals. Still unstable with gait requiring min assist for balance and frequent VC for safety and awareness. Improved tolerance with gait distance. Eager to work with PT today. Patient will continue to benefit from skilled physical therapy services to further improve independence with functional mobility.   Follow Up Recommendations  CIR     Equipment Recommendations  None recommended by PT    Recommendations for Other Services       Precautions / Restrictions Precautions Precautions: Fall;Back Precaution Comments: Reviewed precautions Required Braces or Orthoses: Spinal Brace Spinal Brace: Applied in sitting position;Other (comment) Spinal Brace Comments: spinal brace in room, no orders for one, donned EOB Restrictions Weight Bearing Restrictions: No    Mobility  Bed Mobility Overal bed mobility: Needs Assistance Bed Mobility: Rolling;Sidelying to Sit Rolling: Supervision Sidelying to sit: Supervision       General bed mobility comments: Required cues today to maintain back precautions, attempted to flex forward.  Transfers Overall transfer  level: Needs assistance Equipment used: Rolling walker (2 wheeled) Transfers: Sit to/from Stand Sit to Stand: Min assist         General transfer comment: Min assist for balance. Good boost and power up to standing x2 from bed however loss of balance towards posterior requiring min assist to correct with cues to hold RW once standing  Ambulation/Gait Ambulation/Gait assistance: Min assist Ambulation Distance (Feet): 115 Feet Assistive device: Rolling walker (2 wheeled) Gait Pattern/deviations: Step-through pattern;Decreased stride length;Scissoring;Ataxic;Staggering left;Staggering right;Drifts right/left Gait velocity: decreased Gait velocity interpretation: Below normal speed for age/gender General Gait Details: Focused on symmetry of gait and coordination of LEs. Intermittent min assist for loss of balance and staggering. VC to maintain UE support on RW. Scissoring with forward and backwards stepping, cues to widen base of support. Cues for safety awareness.   Stairs            Wheelchair Mobility    Modified Rankin (Stroke Patients Only)       Balance                                    Cognition Arousal/Alertness: Awake/alert Behavior During Therapy: WFL for tasks assessed/performed Overall Cognitive Status: Impaired/Different from baseline Area of Impairment: Attention;Memory;Following commands;Safety/judgement;Problem solving   Current Attention Level: Alternating Memory: Decreased recall of precautions;Decreased short-term memory Following Commands: Follows one step commands consistently Safety/Judgement: Decreased awareness of safety;Decreased awareness of deficits   Problem Solving: Difficulty sequencing;Requires verbal cues;Requires tactile cues      Exercises      General Comments        Pertinent Vitals/Pain Pain Assessment: Faces Faces Pain Scale:  ("I'm narced up")  Pain Location: Back Pain Descriptors / Indicators:  ("i'm  narced up" ) Pain Intervention(s): Monitored during session;Repositioned    Home Living                      Prior Function            PT Goals (current goals can now be found in the care plan section) Acute Rehab PT Goals Patient Stated Goal: To regain independence  PT Goal Formulation: With patient/family Time For Goal Achievement: 07/01/15 Potential to Achieve Goals: Good Progress towards PT goals: Progressing toward goals    Frequency  Min 5X/week    PT Plan Current plan remains appropriate    Co-evaluation             End of Session Equipment Utilized During Treatment: Back brace;Gait belt Activity Tolerance: Patient tolerated treatment well Patient left: with call bell/phone within reach;in chair;with chair alarm set;with nursing/sitter in room     Time: 1020-1045 PT Time Calculation (min) (ACUTE ONLY): 25 min  Charges:  $Gait Training: 8-22 mins $Therapeutic Activity: 8-22 mins                    G Codes:      Ellouise Newer 06/23/15, 11:06 AM Elayne Snare, [place]

## 2015-06-22 NOTE — Discharge Summary (Signed)
Physician Discharge Summary  Patient ID: Rita Gregory MRN: [id number] DOB/AGE: [birth year] 70 y.o.  Admit date: 06/12/2015 Discharge date: 06/22/2015  Admission Diagnoses: Idiopathic scoliosis with degenerative features of the lumbar spine, right lumbar radiculopathy  Discharge Diagnoses: Idiopathic scoliosis with degenerative features of lumbar spine, right lumbar radiculopathy Active Problems:   Intractable back pain   Scoliosis of lumbar spine   Lumbar spondylolysis   S/P lumbar fusion   Lethargy   Postprocedural hypotension   Benign essential HTN   Fibromyalgia   Chronic pain syndrome   Post-operative pain   Respiratory depression   Acute blood loss anemia   Leukocytosis   Hypomagnesemia   Discharged Condition: fair  Hospital Course: Patient was admitted because of severe and unrelenting back and right leg pain. Despite efforts echo conservative management this was not improving. The patient had a scoliosis that measured over 30. She was advised regarding surgical intervention which was undertaken to reduce her scoliosis and decompress her right-sided nerve roots at L4-5 and L5-S1. He tolerated the surgery well. Her radicular pain is improved. She is to undergo inpatient rehabilitation for further improvement in her functional status.  Consults: rehabilitation medicine  Significant Diagnostic Studies: Postoperative plain radiographs that demonstrate reduction of the lumbar scoliosis to 17. These are radiographs with the patient standing.  Treatments: surgery: Decompression and stabilization from T12-S1 with posterior lumbar interbody arthrodesis at L4-5 anterolateral arthrodesis at L1-L2 3 and L3 for posterior fusion from T12-S1 iliacs group placement for additional fixation.  Discharge Exam: Blood pressure 104/72, pulse 71, temperature 97.9 F (36.6 C), temperature source Oral, resp. rate 16, height 5\' 4"  (1.626 m), weight 72.4 kg (159 lb 9.8 oz), SpO2 96 %. Incision  is clean and dry. Motor function reveals good strength in iliopsoas quadriceps tibialis anterior and gastrocs all rated at 4+ out of 5.  Disposition: 01-Home or Self Care  Discharge Instructions    Call MD for:  redness, tenderness, or signs of infection (pain, swelling, redness, odor or green/yellow discharge around incision site)    Complete by:  As directed      Call MD for:  severe uncontrolled pain    Complete by:  As directed      Call MD for:  temperature >100.4    Complete by:  As directed      Diet - low sodium heart healthy    Complete by:  As directed      Increase activity slowly    Complete by:  As directed             Medication List    TAKE these medications        acetaminophen 650 MG CR tablet  Commonly known as:  TYLENOL  Take 650 mg by mouth every 8 (eight) hours as needed for pain.     albuterol 108 (90 Base) MCG/ACT inhaler  Commonly known as:  PROVENTIL HFA;VENTOLIN HFA  Inhale 2 puffs into the lungs every 6 (six) hours as needed. For shortness of breath     cycloSPORINE 0.05 % ophthalmic emulsion  Commonly known as:  RESTASIS  Place 1 drop into both eyes 2 (two) times daily.     DEPLIN 15 15-90.314 MG Caps  Take 1 capsule by mouth daily.     EPIPEN 2-PAK 0.3 mg/0.3 mL Devi  Generic drug:  EPINEPHrine  Inject 0.3 mg into the muscle once. To medications, touch, or inhaltions     escitalopram 10 MG tablet  Commonly known as:  LEXAPRO  Take 10 mg by mouth 2 (two) times daily.     estrogens (conjugated) 0.3 MG tablet  Commonly known as:  PREMARIN  Take 0.3 mg by mouth daily.     gabapentin 100 MG capsule  Commonly known as:  NEURONTIN  Take 1 capsule (100 mg total) by mouth 2 (two) times daily.     HYDROcodone-acetaminophen 5-325 MG tablet  Commonly known as:  NORCO/VICODIN  Take 1-2 tablets by mouth every 6 (six) hours as needed. pain     ketoconazole 2 % cream  Commonly known as:  NIZORAL  Apply 1 application topically 2 (two) times  daily as needed for irritation.     lansoprazole 30 MG capsule  Commonly known as:  PREVACID  Take 30 mg by mouth 2 (two) times daily before a meal.     levothyroxine 25 MCG tablet  Commonly known as:  SYNTHROID, LEVOTHROID  Take 25 mcg by mouth at bedtime.     lisinopril 2.5 MG tablet  Commonly known as:  PRINIVIL,ZESTRIL  Take 1.25 mg by mouth every morning.     methylphenidate 15 mg/9hr  Commonly known as:  [place] 1 patch onto the skin daily. wear patch for 9 hours only each day     methylphenidate 10 mg/9hr patch  Commonly known as:  [place] 1 patch onto the skin daily as needed (15mg  daily/10 mg as a prn). Take if extra if needed for ADHD. Wear patch for 9 hours only each day     pravastatin 40 MG tablet  Commonly known as:  PRAVACHOL  Take 40 mg by mouth at bedtime.     pyridOXINE 100 MG tablet  Commonly known as:  VITAMIN B-6  Take 100 mg by mouth daily.     ranitidine 150 MG capsule  Commonly known as:  ZANTAC  Take 150 mg by mouth 2 (two) times daily as needed. For GERD     testosterone 50 MG/5GM (1%) Gel  Commonly known as:  ANDROGEL  Place 5 g onto the skin See admin instructions. Three times weekly as needed for hormone replacement     traZODone 50 MG tablet  Commonly known as:  DESYREL  Take 50 mg by mouth at bedtime.     TRILIPIX 135 MG capsule  Generic drug:  [place] Fenofibrate  Take 135 mg by mouth at bedtime.         SignedEarleen Newport 06/22/2015, 10:03 AM

## 2015-06-22 NOTE — Care Management Note (Signed)
Case Management Note  Patient Details  Name: AVERY GINGRAS MRN: [id number] Date of Birth: 07-Sep-1945  Subjective/Objective:                    Action/Plan: Patient discharging to CIR today. No further needs per CM.   Expected Discharge Date:                  Expected Discharge Plan:  [place]  In-House Referral:     Discharge planning Services  CM Consult  Post Acute Care Choice:    Choice offered to:     DME Arranged:    DME Agency:     HH Arranged:    [place] Agency:     Status of Service:  Completed, signed off  Medicare Important Message Given:    Date Medicare IM Given:    Medicare IM give by:    Date Additional Medicare IM Given:    Additional Medicare Important Message give by:     If discussed at [place] of Stay Meetings, dates discussed:    Additional Comments:  Pollie Friar, RN 06/22/2015, 10:33 AM

## 2015-06-22 NOTE — Progress Notes (Signed)
Order to d/c foley cath for today, patient refused & requesting foley to be left in for tonight. Stated she would like to sleep well tonight & if foley is out she will getting out of bed frequently & will not be able to sleep. Patient is also having a lot of pain with mov't. Explained I will d/c in the morning & she agreed.

## 2015-06-22 NOTE — PMR Pre-admission (Signed)
PMR Admission Coordinator Pre-Admission Assessment  Patient: Rita Gregory is an 70 y.o., female MRN: [id number] DOB: 26-Aug-1945 Height: 5\' 4"  (162.6 cm) Weight: 72.4 kg (159 lb 9.8 oz)              Insurance Information HMO:     PPO: yes     PCP:      IPA:      80/20:      OTHER: Pt's spouse is employee PRIMARY: Janit Bern      Policy#: [id number]      Subscriber: spouse CM Name: [name]      Phone#: [phone] ext [phone]     Fax#: n/a No follow up needed as pt is [facility] UMR Pre-Cert#: not needed due to employee UMR      Employer: [employer] Benefits:  Phone #: [phone]     Name: 06/19/15 Eff. Date: 02/09/14     Deduct: none      Out of Pocket Max: [phone]      Life Max: none CIR: $500 copay per admit then covers 80%      SNF: 80% 120 days per calender year Outpatient: $20 copay  24 visits combined per year      Home Health: 80%      Co-Pay: 20% DME: 80%     Co-Pay: 20% Providers: in network  SECONDARY: Medicare A and B      Policy#: [id number]      Subscriber: pt THIRD: Tricare for Life  Policy: [id number] Subscriber: spouse  Medicaid Application Date:       Case Manager:  Disability Application Date:       Case Worker:   Emergency [place]    Name Relation Home Work Mobile   [place] Spouse [phone]     Current Medical History  Patient Admitting Diagnosis: progressive Kyphoscoliosis with lumbar radiculopathy  History of Present Illness: Rita Gregory is a 70 y.o. female with history for HTN, fibromyalgia, chronic pain, kyphoscoliosis with increase in curve to 40 degrees who was admitted with intractible right buttock and back pain.and inability to ambulate on 06/12/15. Patient lives with her husband in [place], [place]. Her husband is a practicing physician for [employer]. She was admitted for pain management and required sedation for MRI which revealed progression in scoliotic  stenosis on right with L4-5 and L5-S1 and L2/3 and L3/4 on left. She elected to undergo two stage procedure--anterolateral decompression with fusion from L1 to L4 followed by posterior decompression of right side L4 and L5 nerve roots with fusion of L4/5 and segmental fixation from T12 to illium with pedicle screws and rods on 06/16/15. Post op had issue with hypotension and lethargy and PCCM recommended pulmicort with limitation of narcotics to avoid sedation as well as IVF for BP support. affecting mobility. As blood pressures stabilized narcotics resumed and have been titrated to help with pain management. BP medications d/c and she was transfused with 2 units PRBC for drop in H/H to 6.2/19.1. Hyperglycemia due to steroids improving and SSI d/c per input with husband. Therapy ongoing and patient continues to be limited by pain, BLE weakness, poor safety awareness and ataxic gait.   Past Medical History  Past Medical History  Diagnosis Date  . Diverticulosis of colon (without mention of hemorrhage) 11/09/2007  . Colon polyp 11/09/2007    Hyperplastic   . Esophageal reflux   . Fibromyalgia   . Hypertension   . Kyphoscoliosis  40 degree bend ro right side  . Chronic pain     Dr. Ellene Route follows  . Patent foramen ovale     hx.- Dr. Lemmie Evens. Smith,cardiology- follows  . Migraine   . Paroxysmal spells ([place]) 02/13/2013    Family History  family history includes Bipolar disorder in her sister and son; Colon cancer in her father; Diabetes type II in her father; Heart disease in her father; Kidney disease in her father; Ovarian cancer in her mother; Stomach cancer in her paternal uncle.  Prior Rehab/Hospitalizations:  Has the patient had major surgery during 100 days prior to admission? No  Current Medications   Current facility-administered medications:  .  0.9 %  sodium chloride infusion, 250 mL, Intravenous, Continuous, Kristeen Miss, MD, 250 mL at 06/16/15 2351 .  acetaminophen (TYLENOL)  tablet 650 mg, 650 mg, Oral, Q4H PRN **OR** acetaminophen (TYLENOL) suppository 650 mg, 650 mg, Rectal, Q4H PRN, Kristeen Miss, MD .  albuterol (PROVENTIL) (2.5 MG/3ML) 0.083% nebulizer solution 3 mL, 3 mL, Inhalation, Q6H PRN, Kristeen Miss, MD .  alum & mag hydroxide-simeth (MAALOX/MYLANTA) 200-200-20 MG/5ML suspension 30 mL, 30 mL, Oral, Q6H PRN, Kristeen Miss, MD .  antiseptic oral rinse (CPC / CETYLPYRIDINIUM CHLORIDE 0.05%) solution 7 mL, 7 mL, Mouth Rinse, BID, Kristeen Miss, MD, 7 mL at 06/22/15 0913 .  bisacodyl (DULCOLAX) suppository 10 mg, 10 mg, Rectal, Daily PRN, Kristeen Miss, MD .  budesonide (PULMICORT) nebulizer solution 0.5 mg, 0.5 mg, Nebulization, BID, Jose Angelo A [place], MD, 0.5 mg at 06/21/15 2040 .  cycloSPORINE (RESTASIS) 0.05 % ophthalmic emulsion 1 drop, 1 drop, Both Eyes, BID, Kristeen Miss, MD, 1 drop at 06/22/15 0911 .  dexamethasone (DECADRON) tablet 2 mg, 2 mg, Oral, Q12H, Kristeen Miss, MD, 2 mg at 06/22/15 0911 .  diazepam (VALIUM) tablet 2 mg, 2 mg, Oral, Q6H PRN, Ashok Pall, MD, 2 mg at 06/22/15 0526 .  docusate sodium (COLACE) capsule 100 mg, 100 mg, Oral, BID, Kristeen Miss, MD, 100 mg at 06/22/15 0911 .  enoxaparin (LOVENOX) injection 40 mg, 40 mg, Subcutaneous, Q24H, Nishant Dhungel, MD, 40 mg at 06/21/15 1334 .  escitalopram (LEXAPRO) tablet 10 mg, 10 mg, Oral, BID, Kristeen Miss, MD, 10 mg at 06/22/15 0911 .  estrogens (conjugated) (PREMARIN) tablet 0.3 mg, 0.3 mg, Oral, Daily, Kristeen Miss, MD, 0.3 mg at 06/22/15 0912 .  famotidine (PEPCID) tablet 20 mg, 20 mg, Oral, QHS, Ashok Pall, MD, 20 mg at 06/21/15 2142 .  fenofibrate tablet 54 mg, 54 mg, Oral, Daily, Kristeen Miss, MD, 54 mg at 06/22/15 0912 .  fentaNYL (SUBLIMAZE) injection 12.5-25 mcg, 12.5-25 mcg, Intravenous, Q1H PRN, Earnie Larsson, MD, 12.5 mcg at 06/21/15 0716 .  ferrous gluconate (FERGON) tablet 324 mg, 324 mg, Oral, BID WC, Nishant Dhungel, MD, 324 mg at 06/22/15 0900 .  gabapentin (NEURONTIN)  capsule 100 mg, 100 mg, Oral, BID, Kristeen Miss, MD, 100 mg at 06/22/15 0911 .  ketoconazole (NIZORAL) 2 % cream 1 application, 1 application, Topical, BID, Ashok Pall, MD, 1 application at [id number] 0912 .  levothyroxine (SYNTHROID, LEVOTHROID) tablet 25 mcg, 25 mcg, Oral, QHS, Kristeen Miss, MD, 25 mcg at 06/21/15 2142 .  magnesium citrate solution 1 Bottle, 1 Bottle, Oral, Once PRN, Kristeen Miss, MD .  menthol-cetylpyridinium (CEPACOL) lozenge 3 mg, 1 lozenge, Oral, PRN **OR** phenol (CHLORASEPTIC) mouth spray 1 spray, 1 spray, Mouth/Throat, PRN, Kristeen Miss, MD .  methocarbamol (ROBAXIN) tablet 500 mg, 500 mg, Oral, Q6H PRN, 500 mg at 06/22/15  0911 **OR** methocarbamol (ROBAXIN) 500 mg in dextrose 5 % 50 mL IVPB, 500 mg, Intravenous, Q6H PRN, Kristeen Miss, MD, 500 mg at 06/17/15 0016 .  ondansetron (ZOFRAN) injection 4 mg, 4 mg, Intravenous, Q4H PRN, Kristeen Miss, MD .  oxyCODONE (Oxy IR/ROXICODONE) immediate release tablet 10 mg, 10 mg, Oral, Q3H PRN, Kary Kos, MD, 10 mg at 06/22/15 0911 .  pantoprazole (PROTONIX) EC tablet 20 mg, 20 mg, Oral, Daily, Kristeen Miss, MD, 20 mg at 06/22/15 0912 .  polyethylene glycol (MIRALAX / GLYCOLAX) packet 17 g, 17 g, Oral, Daily PRN, Kristeen Miss, MD .  pravastatin (PRAVACHOL) tablet 40 mg, 40 mg, Oral, QHS, Ashok Pall, MD, 40 mg at 06/21/15 2142 .  pyridOXINE (VITAMIN B-6) tablet 200 mg, 200 mg, Oral, QODAY, Ashok Pall, MD, 200 mg at 06/21/15 1008 .  senna (SENOKOT) tablet 8.6 mg, 1 tablet, Oral, BID, Kristeen Miss, MD, 8.6 mg at 06/22/15 0911 .  sodium chloride flush (NS) 0.9 % injection 3 mL, 3 mL, Intravenous, Q12H, Kristeen Miss, MD, 3 mL at 06/22/15 0913 .  sodium chloride flush (NS) 0.9 % injection 3 mL, 3 mL, Intravenous, PRN, Kristeen Miss, MD .  sodium phosphate (FLEET) 7-19 GM/118ML enema 1 enema, 1 enema, Rectal, Once PRN, Kristeen Miss, MD .  traZODone (DESYREL) tablet 50 mg, 50 mg, Oral, QHS, Kristeen Miss, MD, 50 mg at 06/21/15 2141  Patients  Current Diet: Diet regular Room service appropriate?: Yes; Fluid consistency:: Thin Diet - low sodium heart healthy  Precautions / Restrictions Precautions Precautions: Fall, Back Precaution Booklet Issued: No Precaution Comments: Reviewed precautions Spinal Brace: Applied in sitting position, Other (comment) Spinal Brace Comments: spinal brace in room, no orders for one, donned EOB Restrictions Weight Bearing Restrictions: No   Has the patient had 2 or more falls or a fall with injury in the past year?No  Prior Activity Level Community (5-7x/wk): retired June 2015. Driving and used cane pta  [place] Devices/Equipment: Cane (specify quad or straight)  Prior Device Use: Indicate devices/aids used by the patient prior to current illness, exacerbation or injury? cane pta  Prior Functional Level Prior Function Level of Independence: Independent with assistive device(s) Gait / Transfers Assistance Needed: Ambulates with a cane until pain became worse PTA. 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 Sedentary over past month. Was driving.  Self Care: Did the patient need help bathing, dressing, using the toilet or eating?  Needed some help  Indoor Mobility: Did the patient need assistance with walking from room to room (with or without device)? Independent  Stairs: Did the patient need assistance with internal or external stairs (with or without device)? Needed some help  Functional Cognition: Did the patient need help planning regular tasks such as shopping or remembering to take medications? Needed some help  Current Functional Level Cognition  Overall Cognitive Status: Impaired/Different from baseline Current Attention Level: Alternating Orientation Level: Oriented X4 Following Commands: Follows one step commands consistently Safety/Judgement: Decreased awareness of safety, Decreased awareness of deficits     Extremity Assessment (includes Sensation/Coordination)  Upper Extremity Assessment: Defer to OT evaluation  Lower Extremity Assessment: Generalized weakness, RLE deficits/detail, LLE deficits/detail RLE Deficits / Details: Minimal active movement noted, which pt indicates is due to pain in back.  Sensation intact. RLE Coordination: decreased fine motor, decreased gross motor LLE Deficits / Details: Minmal Active movement noted, which pt indicates is due to pain.  Sensation intact. LLE Coordination: decreased fine motor,  decreased gross motor    ADLs  Overall ADL's : Needs assistance/impaired Eating/Feeding: Maximal assistance Eating/Feeding Details (indicate cue type and reason): Pt and spouse report she fed herself ~40% of meal today.  Grooming: Maximal assistance, Bed level Grooming Details (indicate cue type and reason): Pt requires max encouragement to use bil. UEs  Upper Body Bathing: Total assistance, Bed level Lower Body Bathing: Total assistance, Bed level Upper Body Dressing : Total assistance, Bed level Lower Body Dressing: Total assistance, Bed level Toilet Transfer: Total assistance Toilet Transfer Details (indicate cue type and reason): unable to assess due to orthostasis Toileting- Clothing Manipulation and Hygiene: Total assistance, Bed level Functional mobility during ADLs: Maximal assistance, +2 for physical assistance General ADL Comments: Pt moves slowly, requires encouragement to fully participate as she is very passive and tentative with her movement     Mobility  Overal bed mobility: Needs Assistance Bed Mobility: Rolling, Sidelying to Sit Rolling: Supervision Sidelying to sit: Supervision Sit to sidelying: Mod assist General bed mobility comments: Required cues today to maintain back precautions, attempted to flex forward.    Transfers  Overall transfer level: Needs assistance Equipment used: Rolling walker (2 wheeled) Transfers: Sit to/from Stand Sit to  Stand: Min assist Stand pivot transfers: Min assist General transfer comment: Min assist for balance. Good boost and power up to standing x2 from bed however loss of balance towards posterior requiring min assist to correct with cues to hold RW once standing    Ambulation / Gait / Stairs / Wheelchair Mobility  Ambulation/Gait Ambulation/Gait assistance: Museum/gallery curator (Feet): 115 Feet Assistive device: Rolling walker (2 wheeled) Gait Pattern/deviations: Step-through pattern, Decreased stride length, Scissoring, Ataxic, Staggering left, Staggering right, Drifts right/left General Gait Details: Focused on symmetry of gait and coordination of LEs. Intermittent min assist for loss of balance and staggering. VC to maintain UE support on RW. Scissoring with forward and backwards stepping, cues to widen base of support. Cues for safety awareness. Gait velocity: decreased Gait velocity interpretation: Below normal speed for age/gender    Posture / Balance Dynamic Sitting Balance Sitting balance - Comments: min guard to supervision in sitting due to posterior preference Balance Overall balance assessment: Needs assistance Sitting-balance support: Feet supported, Bilateral upper extremity supported Sitting balance-Leahy Scale: Poor Sitting balance - Comments: min guard to supervision in sitting due to posterior preference Postural control: Posterior lean Standing balance support: Bilateral upper extremity supported Standing balance-Leahy Scale: Poor    Special needs/care consideration Skin surgical incision Bowel mgmt:LBM 3/11 continent Bladder mgmt: catheter    Previous Home Environment Living Arrangements: Spouse/significant other  Lives With: Spouse Available Help at Discharge: Available 24 hours/day, Friend(s) (friends or hired Dentist ) Type of Home: House Home Layout: Two level, Full bath on main level, Able to live on main level with bedroom/bathroom (basement then  two level living area of home.Typical bedroom ) Alternate Level Stairs-Rails: Right, Left, Can reach both Alternate Level Stairs-Number of Steps: flight Home Access: Stairs to enter Entrance Stairs-Rails: Right, Left Entrance Stairs-Number of Steps: 2 Bathroom Shower/Tub: Multimedia programmer: Standard Bathroom Accessibility: Yes How Accessible: Accessible via walker Home Care Services: No Additional Comments: spouse is MD and is currently working full time   Discharge Living Setting Plans for Discharge Living Setting: Patient's home, Lives with (comment) (spouse) Type of Home at Discharge: House Discharge Home Layout: Full bath on main level, Able to live on main level with bedroom/bathroom (two level and a basement.typical bed.bath upstairs but  can s) Discharge Home Access: Stairs to enter Entrance Stairs-Rails: Right, Left Entrance Stairs-Number of Steps: 2 Discharge Bathroom Shower/Tub: Walk-in shower Discharge Bathroom Toilet: Standard Discharge Bathroom Accessibility: Yes How Accessible: Accessible via walker Does the patient have any problems obtaining your medications?: No  Social/Family/Support Systems Patient Roles: Spouse, Parent (adult children out of state) Contact Information: Dr. Tobe Sos, spouse Anticipated Caregiver: spouse at night and friends or hired assist during the day Anticipated Caregiver's Contact Information: see above Ability/Limitations of Caregiver: spouse works fulltime as Arts administrator Availability: 24/7 Discharge Plan Discussed with Primary Caregiver: Yes Is Caregiver In Agreement with Plan?: Yes Does Caregiver/Family have Issues with Lodging/Transportation while Pt is in Rehab?: No Spouse states he will arrange for friends or hire assist 24/7 to provide supervision when he is not at home and working. He is aware that medical insurance will not cover that superivision to minimum assistance.  Goals/Additional  Needs Patient/Family Goal for Rehab: supervision to min assist with PT and OT Expected length of stay: [facility] 10-14 days Equipment Needs: Pt wears orthotics PTA. SHe states she has a 1 1/4" descrepancy in right and left leg Pt/Family Agrees to Admission and willing to participate: Yes  Decrease burden of Care through IP rehab admission: n/a  Possible need for SNF placement upon discharge:not anticipated, but spouse has asked if I feel pt will be able to go home with hired assist and friends or if further rehab will be needed at [facility].  Patient Condition: This patient's medical and functional status has changed since the consult dated: 06/17/2015 in which the Rehabilitation Physician determined and documented that the patient's condition is appropriate for intensive rehabilitative care in an inpatient rehabilitation facility. See "History of Present Illness" (above) for medical update. Functional changes are: min to mod assist. Patient's medical and functional status update has been discussed with the Rehabilitation physician and patient remains appropriate for inpatient rehabilitation. Will admit to inpatient rehab today.  Preadmission Screen Completed By:  Cleatrice Burke, 06/22/2015 11:57 AM ______________________________________________________________________   Discussed status with Dr. Posey Pronto on 06/22/2015 at  1157 and received telephone approval for admission today.  Admission Coordinator:  Cleatrice Burke, time [id number] Date 06/22/2015.

## 2015-06-22 NOTE — H&P (View-Only) (Signed)
Physical Medicine and Rehabilitation Admission H&P    CC:  Progressive degenerative lumbar scoliosis    HPI:  Rita Gregory is a 70 y.o. female with history for HTN, fibromyalgia, chronic pain, kyphoscoliosis with increase in curve to 40 degrees who was admitted with intractible right buttock and back pain.and inability to ambulate on 06/12/15. Patient lives with her husband in [place], [place]. Her husband is a practicing physician for [employer]. She does not have 24/7 support at discharge. Two-level home with bedroom downstairs. She used a walker prior to admission however the past month she has been very sedentary. No local family to assist. She was admitted for pain management and required sedation for MRI which revealed progression in scoliotic stenosis on right with L4-5 and L5-S1 and L2/3 and L3/4 on left. She elected to undergo two stage procedure--anterolateral decompression with fusion from L1 to L4 followed by posterior decompression of right side L4 and L5 nerve roots with fusion of L4/5 and segmental fixation from T12 to illium with pedicle screws and rods on 06/16/15. Post op had issue with hypotension and lethargy and PCCM recommended  pulmicort with limitation of narcotics to avoid sedation as well as IVF for BP support. affecting mobility. As blood pressures stabilized narcotics resumed and have been titrated to help with pain management. BP medications d/c and she was transfused with 2 units PRBC for drop in H/H to 6.2/19.1.  Hyperglycemia due to steroids improving and SSI d/c per input with husband.  Therapy ongoing and patient continues to be limited by pain, BLE weakness, poor safety awareness and ataxic gait.     Review of Systems  HENT: Negative for hearing loss.   Eyes: Negative for blurred vision and double vision.  Respiratory: Negative for cough and shortness of breath.   Cardiovascular: Negative for chest pain and palpitations.    Gastrointestinal: Negative for heartburn, nausea and vomiting.  Genitourinary: Positive for frequency. Negative for dysuria and urgency.  Musculoskeletal: Positive for back pain. Negative for myalgias.  Neurological: Negative for dizziness, tingling, sensory change, speech change, focal weakness and headaches.  Psychiatric/Behavioral: The patient has insomnia.   All other systems reviewed and are negative.     Past Medical History  Diagnosis Date  . Diverticulosis of colon (without mention of hemorrhage) 11/09/2007  . Colon polyp 11/09/2007    Hyperplastic   . Esophageal reflux   . Fibromyalgia   . Hypertension   . Kyphoscoliosis     40 degree bend ro right side  . Chronic pain     Dr. Ellene Route follows  . Patent foramen ovale     hx.- Dr. Lemmie Evens. Smith,cardiology- follows  . Migraine   . Paroxysmal spells ([place]) 02/13/2013    Past Surgical History  Procedure Laterality Date  . Appendectomy    . Cholecystectomy    . Abdominal hysterectomy    . Joint replacement      RTKA '05  . Knee arthroscopy w/ acl reconstruction      Rt. Knee  . Dry eyes      uses Restasis daily  . Cesarean section      x2  . Right rotator cuff  2011    sx  . Esophagogastroduodenoscopy  02/23/2012    Procedure: ESOPHAGOGASTRODUODENOSCOPY (EGD);  Surgeon: Irene Shipper, MD;  Location: [facility];  Service: Endoscopy;  Laterality: N/A;  . Radiology with anesthesia N/A 06/15/2015    Procedure: RADIOLOGY WITH ANESTHESIA;  Surgeon: Medication Radiologist, MD;  Location: [facility];  Service: Radiology;  Laterality: N/A;  . Anterior lateral lumbar fusion 4 levels Left 06/16/2015    Procedure: ANTERIOR LATERAL LUMBAR FUSION THORACIC TWELVE-LUMBAR FOUR;  Surgeon: Kristeen Miss, MD;  Location: [facility];  Service: Neurosurgery;  Laterality: Left;  Thoracolumbar spine  . Posterior lumbar fusion 4 level N/A 06/16/2015    Procedure: POSTERIOR LUMBAR FUSION LUMBAR FOUR-FIVE LUMBAR FIVE-SACRAL ONE;  Surgeon: Kristeen Miss, MD;   Location: [facility];  Service: Neurosurgery;  Laterality: N/A;    Family History  Problem Relation Age of Onset  . Ovarian cancer Mother     passed at age 38  . Heart disease Father   . Kidney disease Father     Kidney cancer  . Stomach cancer Paternal Uncle   . Diabetes type II Father     passed at 6 yrs old  . Colon cancer Father     cancereous polyps  . Bipolar disorder Sister     Osteopenia  . Bipolar disorder Son     Social History:  Married--was ambulating with a cane PTA. Husband is full time physician. reports that she has been passively smoking. She does not have any smokeless tobacco history on file. She reports that she drinks about 0.5 - 1.5 oz of alcohol per week. She reports that she does not use illicit drugs.   Allergies  Allergen Reactions  . Fentanyl Shortness Of Breath    Pt has tolerated this medication 06/2015 pre and during operation. MD ok giving.  . Morphine And Related Anaphylaxis    respiratory depression   . Bupropion Hcl     Dizzy, fog  . Codeine Nausea And Vomiting  . Demerol [Meperidine] Nausea And Vomiting  . Dextromethorphan Other (See Comments)    Unknown   . Guaifenesin & Derivatives     Other, unknown  . Lipitor [Atorvastatin]   . Lovaza [Omega-3-Acid Ethyl Esters] Diarrhea  . Lyrica [Pregabalin]     Fatigue and depression  . Naltrexone     depression  . Percodan [Oxycodone-Aspirin] Nausea And Vomiting  . Prilosec [Omeprazole]     Iliacs    . Topamax [Topiramate]     disorientation   . Valium [Diazepam]     Small doses are OK but large doses are not. Long-term use causes depression.   . Versed [Midazolam]     In moderate in large doses shortness of breath, and respiratory depression   . Adhesive [Tape] Rash    Skin irritation   . Concerta [Methylphenidate] Palpitations  . Lamotrigine Rash  . Nickel Rash    Skin irritation   . Oxycodone-Acetaminophen Nausea And Vomiting and Rash    depression  . Percocet  [Oxycodone-Acetaminophen] Nausea And Vomiting and Rash  . Tegaderm Ag Mesh [Silver] Rash    Medications Prior to Admission  Medication Sig Dispense Refill  . acetaminophen (TYLENOL) 650 MG CR tablet Take 650 mg by mouth every 8 (eight) hours as needed for pain.    Marland Kitchen albuterol (PROVENTIL HFA;VENTOLIN HFA) 108 (90 BASE) MCG/ACT inhaler Inhale 2 puffs into the lungs every 6 (six) hours as needed. For shortness of breath    . Choline Fenofibrate (TRILIPIX) 135 MG capsule Take 135 mg by mouth at bedtime.    . cycloSPORINE (RESTASIS) 0.05 % ophthalmic emulsion Place 1 drop into both eyes 2 (two) times daily.    Marland Kitchen EPINEPHrine (EPIPEN 2-PAK) 0.3 mg/0.3 mL DEVI Inject 0.3 mg into the muscle once. To medications, touch, or  inhaltions    . escitalopram (LEXAPRO) 10 MG tablet Take 10 mg by mouth 2 (two) times daily.    Marland Kitchen estrogens, conjugated, (PREMARIN) 0.3 MG tablet Take 0.3 mg by mouth daily.    Marland Kitchen gabapentin (NEURONTIN) 100 MG capsule Take 1 capsule (100 mg total) by mouth 2 (two) times daily. 60 capsule 3  . HYDROcodone-acetaminophen (NORCO/VICODIN) 5-325 MG tablet Take 1-2 tablets by mouth every 6 (six) hours as needed. pain  0  . ketoconazole (NIZORAL) 2 % cream Apply 1 application topically 2 (two) times daily as needed for irritation.     Marland Kitchen L-Methylfolate-Algae (DEPLIN 15) 15-90.314 MG CAPS Take 1 capsule by mouth daily.  1  . lansoprazole (PREVACID) 30 MG capsule Take 30 mg by mouth 2 (two) times daily before a meal.    . levothyroxine (SYNTHROID, LEVOTHROID) 25 MCG tablet Take 25 mcg by mouth at bedtime.    Marland Kitchen lisinopril (PRINIVIL,ZESTRIL) 2.5 MG tablet Take 1.25 mg by mouth every morning.     . methylphenidate (DAYTRANA) 10 mg/9hr Place 1 patch onto the skin daily as needed (15mg  daily/10 mg as a prn). Take if extra if needed for ADHD. Wear patch for 9 hours only each day    . methylphenidate (DAYTRANA) 15 mg/9hr Place 1 patch onto the skin daily. wear patch for 9 hours only each day    .  pravastatin (PRAVACHOL) 40 MG tablet Take 40 mg by mouth at bedtime.    . pyridOXINE (VITAMIN B-6) 100 MG tablet Take 100 mg by mouth daily.     . ranitidine (ZANTAC) 150 MG capsule Take 150 mg by mouth 2 (two) times daily as needed. For GERD    . testosterone (ANDROGEL) 50 MG/5GM GEL Place 5 g onto the skin See admin instructions. Three times weekly as needed for hormone replacement    . traZODone (DESYREL) 50 MG tablet Take 50 mg by mouth at bedtime.       Home: Home Living Family/patient expects to be discharged to:: Inpatient rehab Living Arrangements: Spouse/significant other Additional Comments: spouse is MD and is currently working full time    Functional History: Prior Function Level of Independence: Needs assistance Gait / Transfers Assistance Needed: Ambulates with a cane until pain became worse PTA.  Functional Status:  Mobility: Bed Mobility Overal bed mobility: Needs Assistance Bed Mobility: Rolling, Sidelying to Sit Rolling: Supervision Sidelying to sit: Supervision Sit to sidelying: Mod assist General bed mobility comments: Supervision for safety with use of rail to rise to EOB. Did not require cues to perform this date Transfers Overall transfer level: Needs assistance Equipment used: Rolling walker (2 wheeled) Transfers: Sit to/from Stand Sit to Stand: Min guard Stand pivot transfers: Min assist General transfer comment: Min guard for safety. Slight posterior lean. Practiced from bed and recliner. VC for hand placement. Ambulation/Gait Ambulation/Gait assistance: Min assist Ambulation Distance (Feet): 100 Feet Assistive device: Rolling walker (2 wheeled) Gait Pattern/deviations: Step-through pattern, Decreased stride length, Scissoring, Ataxic, Staggering left, Staggering right, Drifts right/left General Gait Details: Min assist intermittently for balance. Frequent VC for attention to gait mechanics. Grossly uncoordinated, no buckling. VC for walker placement  for support. Easily distracted. Good backwards steps. Gait velocity: decreased Gait velocity interpretation: Below normal speed for age/gender    ADL: ADL Overall ADL's : Needs assistance/impaired Eating/Feeding: Maximal assistance Eating/Feeding Details (indicate cue type and reason): Pt and spouse report she fed herself ~40% of meal today.  Grooming: Maximal assistance, Bed level Grooming Details (indicate cue type  and reason): Pt requires max encouragement to use bil. UEs  Upper Body Bathing: Total assistance, Bed level Lower Body Bathing: Total assistance, Bed level Upper Body Dressing : Total assistance, Bed level Lower Body Dressing: Total assistance, Bed level Toilet Transfer: Total assistance Toilet Transfer Details (indicate cue type and reason): unable to assess due to orthostasis Toileting- Clothing Manipulation and Hygiene: Total assistance, Bed level Functional mobility during ADLs: Maximal assistance, +2 for physical assistance General ADL Comments: Pt moves slowly, requires encouragement to fully participate as she is very passive and tentative with her movement   Cognition: Cognition Overall Cognitive Status: Impaired/Different from baseline Orientation Level: Oriented X4 Cognition Arousal/Alertness: Awake/alert Behavior During Therapy: WFL for tasks assessed/performed Overall Cognitive Status: Impaired/Different from baseline Area of Impairment: Attention, Memory, Following commands, Safety/judgement, Problem solving Current Attention Level: Sustained Memory: Decreased recall of precautions, Decreased short-term memory Following Commands: Follows one step commands consistently Safety/Judgement: Decreased awareness of safety, Decreased awareness of deficits Problem Solving: Difficulty sequencing, Requires verbal cues, Requires tactile cues  Physical Exam: Blood pressure 104/72, pulse 71, temperature 97.9 F (36.6 C), temperature source Oral, resp. rate 16,  height 5\' 4"  (1.626 m), weight 72.4 kg (159 lb 9.8 oz), SpO2 96 %. Physical Exam  Nursing note and vitals reviewed. Constitutional: She is oriented to person, place, and time. She appears well-developed and well-nourished.  HENT:  Head: Normocephalic and atraumatic.  Mouth/Throat: Oropharynx is clear and moist.  Eyes: Conjunctivae and EOM are normal. No scleral icterus.  Neck: Normal range of motion. Neck supple.  Cardiovascular: Normal rate and regular rhythm.   No murmur heard. Respiratory: Effort normal and breath sounds normal. No stridor. No respiratory distress. She exhibits no tenderness.  GI: Soft. Bowel sounds are normal. She exhibits no distension. There is no tenderness.  Musculoskeletal: She exhibits no edema or tenderness.  Right lower extremity (shorter) leg length discrepancy   Neurological: She is alert and oriented to person, place, and time.  Motor: Bilateral upper extremities: 4+/5 proximal distal Bilateral lower extremities: Flexion 4/5, knee extension 4+/5, ankle dorsi/plantar flexion 4+/5 Sensation intact to light touch DTRs symmetric  Skin: Skin is warm and dry.  Psychiatric: She has a normal mood and affect. Her behavior is normal. Thought content normal.    No results found for this or any previous visit (from the past 48 hour(s)). Dg Lumbar Spine 2-3 Views  06/20/2015  CLINICAL DATA:  70 year old female with history of back surgery on 06/16/2015. Back pain. EXAM: LUMBAR SPINE - 2-3 VIEW COMPARISON:  Multiple priors, most recently 06/16/2015. FINDINGS: Postoperative changes of PLIF are noted, which appear to extend from T12 to S2, likely involving bilateral sacroiliac joint arthrodesis. Dextroscoliosis on the standing frontal projection, convex to the right at the level of L3. This appears less severe than the preoperative study. Interbody grafts are noted at L1-L2, L2-L3, L3-L4 and L4-L5. IMPRESSION: 1. Postoperative changes of PLIF and bilateral sacroiliac joint  arthrodesis, as above. Electronically Signed   By: Vinnie Langton M.D.   On: 06/20/2015 13:31    Medical Problem List and Plan: 1.  BLE weakness with Ataxic gait, poor safety awareness and back pain secondary to Progressive Kyphoscoliosis with lumbar radiculopathy s/p anterolateral decompression with fusion from L1 to L4 and posterior decompression of right side L4 and L5 nerve roots with fusion of L4/5 and segmental fixation from T12 to illium. 2.  DVT Prophylaxis/Anticoagulation: Pharmaceutical: Lovenox 3. Chronic pain/Pain Management: On neurontin bid.  4. Mood: LCSW to follow for  evaluation and support.  5. Neuropsych: This patient is capable of making decisions on her own behalf. 6. Skin/Wound Care: Monitor wound for healing. Encourage po intake. Add nutritional supplements 7. Fluids/Electrolytes/Nutrition:  Monitor I/O. Check lytes in am. 8. Leucocytosis: Follow up CBC in am. Will check UA/UCS. 9. ABLA with  Iron deficiency:  Continue iron supplement 10. Fibromyalgia: On lexapro.  11. GERD: On pepcid and protonix with reasonable control  12. Constipation: Continue Senna S. Willing to try enema tomorrow if still no results.  13. GU: foley in place per patient preference. Will check UCS. Foley to be removed--start bladder training.    Post Admission Physician Evaluation: 1. Functional deficits secondary  to to Progressive Kyphoscoliosis with lumbar radiculopathy s/p anterolateral decompression with fusion from L1 to L4 and posterior decompression of right side L4 and L5 nerve roots with fusion of L4/5 and segmental fixation from T12 to illium. 2. Patient is admitted to receive collaborative, interdisciplinary care between the physiatrist, rehab nursing staff, and therapy team. 3. Patient's level of medical complexity and substantial therapy needs in context of that medical necessity cannot be provided at a lesser intensity of care such as a SNF. 4. Patient has experienced substantial  functional loss from his/her baseline which was documented above under the "Functional History" and "Functional Status" headings.  Judging by the patient's diagnosis, physical exam, and functional history, the patient has potential for functional progress which will result in measurable gains while on inpatient rehab.  These gains will be of substantial and practical use upon discharge  in facilitating mobility and self-care at the household level. 5. Physiatrist will provide 24 hour management of medical needs as well as oversight of the therapy plan/treatment and provide guidance as appropriate regarding the interaction of the two. 6. 24 hour rehab nursing will assist with safety, skin/wound care, disease management, medication administration, pain management and patient education  and help integrate therapy concepts, techniques,education, etc. 7. PT will assess and treat for/with: Lower extremity strength, range of motion, stamina, balance, functional mobility, safety, adaptive techniques and equipment, woundcare, coping skills, pain control, education.   Goals are: Mod I. 8. OT will assess and treat for/with: ADL's, functional mobility, safety, upper extremity strength, adaptive techniques and equipment, wound mgt, ego support, and community reintegration.   Goals are: Mod I. Therapy may not proceed with showering this patient. 9. Case Management and Social Worker will assess and treat for psychological issues and discharge planning. 10. Team conference will be held weekly to assess progress toward goals and to determine barriers to discharge. 11. Patient will receive at least 3 hours of therapy per day at least 5 days per week. 12. ELOS: 14-18 days.       13. Prognosis:  excellent  Delice Lesch, MD 06/22/2015

## 2015-06-23 ENCOUNTER — Inpatient Hospital Stay (HOSPITAL_COMMUNITY): Payer: 59 | Admitting: Physical Therapy

## 2015-06-23 ENCOUNTER — Inpatient Hospital Stay (HOSPITAL_COMMUNITY): Payer: 59 | Admitting: Occupational Therapy

## 2015-06-23 DIAGNOSIS — E46 Unspecified protein-calorie malnutrition: Secondary | ICD-10-CM

## 2015-06-23 DIAGNOSIS — K5909 Other constipation: Secondary | ICD-10-CM

## 2015-06-23 DIAGNOSIS — I10 Essential (primary) hypertension: Secondary | ICD-10-CM

## 2015-06-23 DIAGNOSIS — E8809 Other disorders of plasma-protein metabolism, not elsewhere classified: Secondary | ICD-10-CM | POA: Diagnosis present

## 2015-06-23 DIAGNOSIS — R279 Unspecified lack of coordination: Secondary | ICD-10-CM

## 2015-06-23 DIAGNOSIS — G8918 Other acute postprocedural pain: Secondary | ICD-10-CM

## 2015-06-23 LAB — COMPREHENSIVE METABOLIC PANEL
ALT: 16 U/L (ref 14–54)
AST: 17 U/L (ref 15–41)
Albumin: 2.6 g/dL — ABNORMAL LOW (ref 3.5–5.0)
Alkaline Phosphatase: 49 U/L (ref 38–126)
Anion gap: 13 (ref 5–15)
BUN: 25 mg/dL — ABNORMAL HIGH (ref 6–20)
CO2: 26 mmol/L (ref 22–32)
Calcium: 9.2 mg/dL (ref 8.9–10.3)
Chloride: 99 mmol/L — ABNORMAL LOW (ref 101–111)
Creatinine, Ser: 0.71 mg/dL (ref 0.44–1.00)
GFR calc Af Amer: >60 mL/min (ref >60)
GFR calc non Af Amer: >60 mL/min (ref >60)
Glucose, Bld: 141 mg/dL — ABNORMAL HIGH (ref 65–99)
Potassium: 4.3 mmol/L (ref 3.5–5.1)
Sodium: 138 mmol/L (ref 135–145)
Total Bilirubin: 0.4 mg/dL (ref 0.3–1.2)
Total Protein: 5.8 g/dL — ABNORMAL LOW (ref 6.5–8.1)

## 2015-06-23 LAB — TYPE AND SCREEN
ABO/RH(D): O POS
Antibody Screen: NEGATIVE
Unit division: 0
Unit division: 0

## 2015-06-23 LAB — CBC WITH DIFFERENTIAL/PLATELET
Basophils Absolute: 0 10*3/uL (ref 0.0–0.1)
Basophils Relative: 0 %
Eosinophils Absolute: 0.2 10*3/uL (ref 0.0–0.7)
Eosinophils Relative: 1 %
HCT: 30.9 % — ABNORMAL LOW (ref 36.0–46.0)
Hemoglobin: 9.6 g/dL — ABNORMAL LOW (ref 12.0–15.0)
Lymphocytes Relative: 19 %
Lymphs Abs: 3.3 10*3/uL (ref 0.7–4.0)
MCH: 28.4 pg (ref 26.0–34.0)
MCHC: 31.1 g/dL (ref 30.0–36.0)
MCV: 91.4 fL (ref 78.0–100.0)
Monocytes Absolute: 1.1 10*3/uL — ABNORMAL HIGH (ref 0.1–1.0)
Monocytes Relative: 6 %
Neutro Abs: 13 10*3/uL — ABNORMAL HIGH (ref 1.7–7.7)
Neutrophils Relative %: 74 %
Platelets: 372 10*3/uL (ref 150–400)
RBC: 3.38 MIL/uL — ABNORMAL LOW (ref 3.87–5.11)
RDW: 14 % (ref 11.5–15.5)
WBC: 17.6 10*3/uL — ABNORMAL HIGH (ref 4.0–10.5)

## 2015-06-23 MED ORDER — BENEPROTEIN PO POWD
1.0000 | Freq: Three times a day (TID) | ORAL | Status: DC
  Administered 2015-06-24 – 2015-06-30 (×13): 6 g via ORAL
  Filled 2015-06-23: qty 227

## 2015-06-23 MED ORDER — TRAMADOL HCL 50 MG PO TABS
50.0000 mg | ORAL_TABLET | Freq: Two times a day (BID) | ORAL | Status: DC
  Administered 2015-06-23 – 2015-07-01 (×15): 50 mg via ORAL
  Filled 2015-06-23 (×16): qty 1

## 2015-06-23 MED ORDER — METHOCARBAMOL 500 MG PO TABS
500.0000 mg | ORAL_TABLET | Freq: Two times a day (BID) | ORAL | Status: DC
  Administered 2015-06-23 – 2015-07-01 (×16): 500 mg via ORAL
  Filled 2015-06-23 (×16): qty 1

## 2015-06-23 MED ORDER — RESOURCE INSTANT PROTEIN PO PWD PACKET
1.0000 | Freq: Three times a day (TID) | ORAL | Status: DC
  Administered 2015-06-23 (×2): 6 g via ORAL
  Filled 2015-06-23 (×3): qty 6

## 2015-06-23 NOTE — IPOC Note (Signed)
Overall Plan of Care [facility]) Patient Details Name: Rita Gregory MRN: [id number] DOB: 1945-08-05  Admitting Diagnosis: [facility] Problems: Active Problems:   Degenerative lumbar spinal stenosis   Weakness of both lower extremities   Ataxia   Gastroesophageal reflux disease without esophagitis   Constipation due to pain medication   Hypoalbuminemia due to protein-calorie malnutrition (HCC)     Functional Problem List: Nursing Bladder, Bowel, Endurance, Medication Management, Pain, Safety, Skin Integrity  PT Balance, Behavior, Edema, Endurance, Motor, Nutrition, Pain  OT Balance, Pain, Endurance, Cognition  SLP    TR         Basic ADL's: OT Grooming, Bathing, Dressing, Toileting     Advanced  ADL's: OT Simple Meal Preparation     Transfers: PT Bed Mobility, Bed to Chair, Car, Manufacturing systems engineer, Metallurgist: PT Ambulation, Emergency planning/management officer, Stairs     Additional Impairments: OT None  SLP        TR      Anticipated Outcomes Item Anticipated Outcome  Self Feeding I  Swallowing      Basic self-care  set up - supervision bathing, dressing  Toileting  mod I   Bathroom Transfers supervision to ambulate to toilet with RW  Bowel/Bladder  continent of bowel and bladder with min assist   Transfers  supervision  Locomotion  supervision  Communication     Cognition     Pain  pain less than or equal to 4/10  Safety/Judgment  Free from falls/injury and displaying sound safety judgement   Therapy Plan: PT Intensity: Minimum of 1-2 x/day ,45 to 90 minutes PT Frequency: 5 out of 7 days PT Duration Estimated Length of Stay: 10-12 days OT Intensity: Minimum of 1-2 x/day, 45 to 90 minutes OT Frequency: 5 out of 7 days OT Duration/Estimated Length of Stay: 10-12 days         Team Interventions: Nursing Interventions Patient/Family Education, Bladder Management, Bowel Management, Pain Management, Skin Care/Wound Management   PT interventions Ambulation/gait training, Training and development officer, Community reintegration, Discharge planning, Disease management/prevention, DME/adaptive equipment instruction, Functional electrical stimulation, Functional mobility training, Neuromuscular re-education, Pain management, Psychosocial support, Patient/family education, Stair training, Therapeutic Activities, Therapeutic Exercise, UE/LE Strength taining/ROM, UE/LE Coordination activities, Wheelchair propulsion/positioning  OT Interventions Training and development officer, Cognitive remediation/compensation, Discharge planning, DME/adaptive equipment instruction, Functional mobility training, Patient/family education, Self Care/advanced ADL retraining, Therapeutic Activities, Therapeutic Exercise, UE/LE Strength taining/ROM, Pain management  SLP Interventions    TR Interventions    SW/CM Interventions Discharge Planning, Psychosocial Support, Patient/Family Education    Team Discharge Planning: Destination: PT-Home ,OT-   , SLP-  Projected Follow-up: PT-Home health PT, 24 hour supervision/assistance, OT-   , SLP-  Projected Equipment Needs: PT- , OT-  , SLP-  Equipment Details: PT-pt owns RW and SPC, OT-  Patient/family involved in discharge planning: PT- Patient,  OT-Patient, SLP-   MD ELOS: 12 -14 days Medical Rehab Prognosis:  Good Assessment: 70 y.o. female with history for HTN, fibromyalgia, chronic pain, kyphoscoliosis with increase in curve to 40 degrees who was admitted with intractible right buttock and back pain.and inability to ambulate on 06/12/15. She does not have 24/7 support at discharge. She was admitted for pain management and required sedation for MRI which revealed progression in scoliotic stenosis on right with L4-5 and L5-S1 and L2/3 and L3/4 on left. She elected to undergo two stage procedure--anterolateral decompression with fusion from L1 to L4 followed by posterior decompression of right side  L4 and L5  nerve roots with fusion of L4/5 and segmental fixation from T12 to illium with pedicle screws and rods on 06/16/15. Post op had issue with hypotension and lethargy and PCCM recommended pulmicort with limitation of narcotics to avoid sedation as well as IVF for BP support. affecting mobility. She was transfused with 2 units PRBC for drop in H/H to 6.2/19.1. Hyperglycemia due to steroids improving and SSI d/c per input with husband.   See Team Conference Notes for weekly updates to the plan of care

## 2015-06-23 NOTE — Progress Notes (Signed)
[facility] Individual Statement of Services  Patient Name:  Rita Gregory  Date:  06/23/2015  Welcome to the [facility].  Our goal is to provide you with an individualized program based on your diagnosis and situation, designed to meet your specific needs.  With this comprehensive rehabilitation program, you will be expected to participate in at least 3 hours of rehabilitation therapies Monday-Friday, with modified therapy programming on the weekends.  Your rehabilitation program will include the following services:  Physical Therapy (PT), Occupational Therapy (OT), 24 hour per day rehabilitation nursing, Case Management (Social Worker), Rehabilitation Medicine, Nutrition Services and Pharmacy Services  Weekly team conferences will be held on Tuesdays to discuss your progress.  Your Social Worker will talk with you frequently to get your input and to update you on team discussions.  Team conferences with you and your family in attendance may also be held.  Expected length of stay:  9-10 days  Overall anticipated outcome:  Supervision  Depending on your progress and recovery, your program may change. Your Social Worker will coordinate services and will keep you informed of any changes. Your Social Worker's name and contact numbers are listed  below.  The following services may also be recommended but are not provided by the [facility] will be made to provide these services after discharge if needed.  Arrangements include referral to agencies that provide these services.  Your insurance has been verified to be:  [employer], Medicare, and T[employer]are Your primary doctor is:  Dr. Legrand Como [place]  Pertinent information will be shared with your doctor and your insurance company.  Social Worker:  Alfonse Alpers, LCSW  [phone] or ([phone]  Information discussed with and copy given to patient by: Trey Sailors, 06/23/2015, 1:56 PM

## 2015-06-23 NOTE — Evaluation (Signed)
Occupational Therapy Assessment and Plan  Patient Details  Name: Rita Gregory MRN: [id number] Date of Birth: 1945/06/20  OT Diagnosis: acute pain, cognitive deficits and muscle weakness (generalized) Rehab Potential: Rehab Potential (ACUTE ONLY): Good ELOS: 10-12 days   Today's Date: 06/23/2015 OT Individual Time: 1100-1206 OT Individual Time Calculation (min): 66 min     Problem List:  Patient Active Problem List   Diagnosis Date Noted  . Hypoalbuminemia due to protein-calorie malnutrition (HCC)   . Degenerative lumbar spinal stenosis 06/22/2015  . Weakness of both lower extremities   . Ataxia   . Gastroesophageal reflux disease without esophagitis   . Constipation due to pain medication   . S/P lumbar fusion   . Lethargy   . Postprocedural hypotension   . Benign essential HTN   . Fibromyalgia   . Chronic pain syndrome   . Post-operative pain   . Respiratory depression   . Acute blood loss anemia   . Leukocytosis   . Hypomagnesemia   . Lumbar spondylolysis   . Intractable back pain 06/12/2015  . Scoliosis of lumbar spine 06/12/2015  . Paroxysmal spells (HCC) 02/13/2013  . GERD (gastroesophageal reflux disease) 02/16/2012  . Diverticulosis 02/16/2012  . Personal history of colonic polyps 02/16/2012  . ARTHRITIS, RIGHT KNEE 03/27/2008  . BUNIONS, BILATERAL 03/27/2008  . FIBROMYALGIA 03/27/2008  . FOOT PAIN, BILATERAL 03/27/2008  . UNEQUAL LEG LENGTH 03/27/2008  . CONGENITAL PES PLANUS 03/27/2008    Past Medical History:  Past Medical History  Diagnosis Date  . Diverticulosis of colon (without mention of hemorrhage) 11/09/2007  . Colon polyp 11/09/2007    Hyperplastic   . Esophageal reflux   . Fibromyalgia   . Hypertension   . Kyphoscoliosis     40 degree bend ro right side  . Chronic pain     Dr. Danielle Dess follows  . Patent foramen ovale     hx.- Dr. Rexene Edison. Smith,cardiology- follows  . Migraine   . Paroxysmal spells (HCC) 02/13/2013   Past Surgical  History:  Past Surgical History  Procedure Laterality Date  . Appendectomy    . Cholecystectomy    . Abdominal hysterectomy    . Joint replacement      RTKA '05  . Knee arthroscopy w/ acl reconstruction      Rt. Knee  . Dry eyes      uses Restasis daily  . Cesarean section      x2  . Right rotator cuff  2011    sx  . Esophagogastroduodenoscopy  02/23/2012    Procedure: ESOPHAGOGASTRODUODENOSCOPY (EGD);  Surgeon: Hilarie Fredrickson, MD;  Location: [facility];  Service: Endoscopy;  Laterality: N/A;  . Radiology with anesthesia N/A 06/15/2015    Procedure: RADIOLOGY WITH ANESTHESIA;  Surgeon: Medication Radiologist, MD;  Location: [facility];  Service: Radiology;  Laterality: N/A;  . Anterior lateral lumbar fusion 4 levels Left 06/16/2015    Procedure: ANTERIOR LATERAL LUMBAR FUSION THORACIC TWELVE-LUMBAR FOUR;  Surgeon: Barnett Abu, MD;  Location: [facility];  Service: Neurosurgery;  Laterality: Left;  Thoracolumbar spine  . Posterior lumbar fusion 4 level N/A 06/16/2015    Procedure: POSTERIOR LUMBAR FUSION LUMBAR FOUR-FIVE LUMBAR FIVE-SACRAL ONE;  Surgeon: Barnett Abu, MD;  Location: [facility];  Service: Neurosurgery;  Laterality: N/A;    Assessment & Plan Clinical Impression: Rita Gregory is a 70 y.o. female with history for HTN, fibromyalgia, chronic pain, kyphoscoliosis with increase in curve to 40 degrees who was admitted with intractible right  buttock and back pain.and inability to ambulate on 06/12/15. Patient lives with her husband in [place], [place]. Her husband is a practicing physician for [employer].  She does not have 24/7 support at discharge.  Two-level home with bedroom downstairs. She used a walker prior to admission however the past month she has been very sedentary. No local family to assist. She was admitted for pain management and required sedation for MRI which revealed progression in scoliotic stenosis on right with L4-5 and L5-S1 and L2/3 and L3/4  on left. She elected to undergo two stage procedure--anterolateral decompression with fusion from L1 to L4 followed by posterior decompression of right side L4 and L5 nerve roots with fusion of L4/5 and segmental fixation from T12 to illium with pedicle screws and rods on 06/16/15. Post op had issue with hypotension and lethargy and PCCM recommended  pulmicort with limitation of narcotics to avoid sedation as well as IVF for BP support. affecting mobility. As blood pressures stabilized narcotics resumed and have been titrated to help with pain management. BP medications d/c and she was transfused with 2 units PRBC for drop in H/H to 6.2/19.1.  Hyperglycemia due to steroids improving and SSI d/c per input with husband.  Therapy ongoing and patient continues to be limited by pain, BLE weakness, poor safety awareness and ataxic gait.  Patient transferred to CIR on 06/22/2015 .    Patient currently requires mod with basic self-care skills secondary to muscle weakness, decreased cardiorespiratoy endurance, decreased awareness, decreased problem solving, decreased memory and delayed processing and decreased standing balance and difficulty maintaining precautions.  Prior to hospitalization, patient could complete basic self care with modified independent  when she was not in a great deal of pain.  Patient will benefit from skilled intervention to increase independence with basic self-care skills prior to discharge home with care partner.  Anticipate patient will require intermittent supervision and follow up home health.  OT - End of Session Activity Tolerance: Tolerates < 10 min activity, no significant change in vital signs Endurance Deficit: Yes Endurance Deficit Description: required supine rest breaks due to pain/fatigue OT Assessment Rehab Potential (ACUTE ONLY): Good OT Patient demonstrates impairments in the following area(s): Balance;Pain;Endurance;Cognition OT Basic ADL's Functional Problem(s):  Grooming;Bathing;Dressing;Toileting OT Advanced ADL's Functional Problem(s): Simple Meal Preparation OT Transfers Functional Problem(s): Toilet;Tub/Shower OT Additional Impairment(s): None OT Plan OT Intensity: Minimum of 1-2 x/day, 45 to 90 minutes OT Frequency: 5 out of 7 days OT Duration/Estimated Length of Stay: 10-12 days OT Treatment/Interventions: Balance/vestibular training;Cognitive remediation/compensation;Discharge planning;DME/adaptive equipment instruction;Functional mobility training;Patient/family education;Self Care/advanced ADL retraining;Therapeutic Activities;Therapeutic Exercise;UE/LE Strength taining/ROM;Pain management OT Self Feeding Anticipated Outcome(s): I OT Basic Self-Care Anticipated Outcome(s): set up - supervision bathing, dressing OT Toileting Anticipated Outcome(s): mod I OT Bathroom Transfers Anticipated Outcome(s): supervision to ambulate to toilet with RW   Skilled Therapeutic Intervention Pt seen for initial evaluation and ADL training with a focus on pt completing tasks without getting too distracted. Pt perseverates a great deal on the pain issues she has had this last month and is apprehensive to try things that may induce the pain such as sitting on a toilet seat. She had a long story about the extensive procedure she used at home with pillows and ice to prevent discomfort from sitting on seat. Pt agreed to sit on regular seat in room but needed both sides of toilet padded with towels. A bench step placed below feet as she cannot tolerate any pressure on back of legs. She  was able to sit and toilet with S.  She was not able to tolerate sitting in w/c briefly without leg rests.   She is quite strong and is able to stand up with supervision, ambulate to bathroom with Rw with steadying A.  Discussed not assuming she will have pain before doing an activity, but to relax first.  She is hyperverbal and even tangential in her speech, needing cues to slow down and  focus on task at hand.  Pt will need training with AE for LB and needs constant cuing with back precautions as she was flexing from the hips frequently even with cues not to do so.  Pt very fatigued at end of session, needed to rest in bed. Pt in bed with all needs met.   OT Evaluation Precautions/Restrictions  Precautions Precautions: Fall;Back Required Braces or Orthoses: Spinal Brace Spinal Brace: Applied in sitting position;Lumbar corset Oxygen Therapy SpO2: 96 % O2 Device: Not Delivered Pain Pain Assessment Pain Score: 5  Pain Type: Acute pain;Surgical pain Pain Location: Back Pain Orientation: Mid;Lower Pain Descriptors / Indicators: Jabbing;Aching (pulsing pain) Pain Onset: With Activity   Home Living/Prior Functioning Home Living Available Help at Discharge: Friend(s), Available PRN/intermittently, Family (open to hiring assistance) Type of Home: House Home Access: Stairs to enter [employer] of Steps: 2 (back entrance) Home Layout: Two level, Full bath on main level, Able to live on main level with bedroom/bathroom, One level Alternate Level Stairs-Rails: Can reach both Bathroom Shower/Tub: Multimedia programmer: Standard Bathroom Accessibility: Yes Additional Comments: Spouse is MD, planning on taking first 2 weeks off  Lives With: Spouse Prior Function Level of Independence: Needs assistance with ADLs, Requires assistive device for independence, Needs assistance with homemaking, Needs assistance with tranfers, Needs assistance with gait (needed assistance with gait at night, used RW or cane during day)  Able to Take Stairs?: Yes Driving: Yes (not frequently, "only if I felt safe") Vocation: Retired ADL ADL ADL Comments: refer to functional navigator Vision/Perception  Vision- Assessment Vision Assessment?: No apparent visual deficits  Cognition Overall Cognitive Status: Impaired/Different from baseline Arousal/Alertness:  Awake/alert Orientation Level: Person;Place;Situation Person: Oriented Place: Oriented Situation: Oriented Year: 2017 Month: March Day of Week: Correct Memory: Impaired Memory Impairment: Decreased recall of new information (difficulty with back precautions) Immediate Memory Recall: Sock;Blue;Bed Memory Recall: Blue;Sock;Bed Memory Recall Sock: With Cue Memory Recall Blue: Without Cue Memory Recall Bed: Without Cue Awareness: Impaired Awareness Impairment: Intellectual impairment (pt is not following through with back precautions) Problem Solving: Impaired (slow processing, tangential in thought patterns) Sensation Sensation Light Touch: Appears Intact (some numbness, tingling on RLE) Stereognosis: Appears Intact Hot/Cold: Appears Intact Proprioception: Appears Intact Coordination Gross Motor Movements are Fluid and Coordinated: No Fine Motor Movements are Fluid and Coordinated: Yes Coordination and Movement Description: limited by pain/weakness Motor  Motor Motor: Abnormal postural alignment and control Motor - Skilled Clinical Observations: premorbid kyphoscoliosis, leg length discrepancy Mobility  Bed Mobility Bed Mobility: Rolling Right;Rolling Left;Supine to Sit;Sit to Supine Rolling Right: 5: Supervision Rolling Left: 5: Supervision Supine to Sit: 5: Supervision Supine to Sit Details: Verbal cues for technique;Verbal cues for precautions/safety;Visual cues/gestures for precautions/safety;Verbal cues for sequencing Sit to Supine: 3: Mod assist;5: Supervision Sit to Supine - Details: Visual cues/gestures for precautions/safety;Verbal cues for precautions/safety;Verbal cues for technique;Verbal cues for sequencing Transfers Sit to Stand: 5: Supervision;With upper extremity assist Sit to Stand Details: Verbal cues for sequencing;Verbal cues for technique  Trunk/Postural Assessment  Cervical Assessment Cervical Assessment: Within Functional Limits  Thoracic  Assessment Thoracic Assessment: Exceptions to [facility] (back precautions) Lumbar Assessment Lumbar Assessment: Exceptions to [facility] (back precautions) Postural Control Postural Control: Deficits on evaluation (delayed)  Balance Balance Balance Assessed: Yes Static Standing Balance Static Standing - Balance Support: During functional activity;Bilateral upper extremity supported Static Standing - Level of Assistance: 5: Stand by assistance Dynamic Standing Balance Dynamic Standing - Balance Support: During functional activity Dynamic Standing - Level of Assistance: 4: Min assist Extremity/Trunk Assessment RUE Assessment RUE Assessment: Within Functional Limits LUE Assessment LUE Assessment: Within Functional Limits   See Function Navigator for Current Functional Status.   Refer to Care Plan for Long Term Goals  Recommendations for other services: None  Discharge Criteria: Patient will be discharged from OT if patient refuses treatment 3 consecutive times without medical reason, if treatment goals not met, if there is a change in medical status, if patient makes no progress towards goals or if patient is discharged from hospital.  The above assessment, treatment plan, treatment alternatives and goals were discussed and mutually agreed upon: by patient  [facility] 06/23/2015, 1:02 PM

## 2015-06-23 NOTE — Progress Notes (Signed)
[name]     PROGRESS NOTE  Subjective/Complaints:  Patient lying in bed this morning. She is alert and appears to be in good spirits. She states she slept fairly overnight. She has questions about spasms in the lower extremities.  ROS: Denies CP, SOB, N/V/D  Objective: Vital Signs: Blood pressure 115/67, pulse 79, temperature 98.5 F (36.9 C), temperature source Oral, resp. rate 17, height 4\' 11"  (1.499 m), SpO2 96 %. No results found.  Recent Labs  06/23/15 0532  WBC 17.6*  HGB 9.6*  HCT 30.9*  PLT 372    Recent Labs  06/23/15 0532  NA 138  K 4.3  CL 99*  GLUCOSE 141*  BUN 25*  CREATININE 0.71  CALCIUM 9.2   CBG (last 3)  No results for input(s): GLUCAP in the last 72 hours.  Wt Readings from Last 3 Encounters:  06/16/15 72.4 kg (159 lb 9.8 oz)  02/13/13 72.122 kg (159 lb)  02/15/12 70.126 kg (154 lb 9.6 oz)    Physical Exam:  BP 115/67 mmHg  Pulse 79  Temp(Src) 98.5 F (36.9 C) (Oral)  Resp 17  Ht 4\' 11"  (1.499 m)  SpO2 96% Constitutional: She appears well-developed and well-nourished.  HENT: Normocephalic and atraumatic.  Eyes: Conjunctivae and EOM are normal. No scleral icterus.  Cardiovascular: Normal rate and regular rhythm. No murmur heard. Respiratory: Effort normal and breath sounds normal. No stridor. No respiratory distress. She exhibits no tenderness.  GI: Soft. Bowel sounds are normal. She exhibits no distension. There is no tenderness.  Musculoskeletal: She exhibits no edema or tenderness.  Right lower extremity (shorter) leg length discrepancy.  Bilateral pes planus. Neurological: She is alert and oriented.  Motor: Bilateral upper extremities: 4+/5 proximal distal RLE hip flexion 4+/5, knee extension 4+/5, ankle dorsi/plantar flexion 4-/5 LLE: hip flexion 4-/5, knee extension 4/5, ankle dorsi/plantar flexion 4+/5 Sensation intact to light touch Skin: Skin is warm and dry.  Psychiatric: She has a  normal mood and affect. Her behavior is normal. Thought content normal.    Assessment/Plan: 1. Functional deficits secondary to to to Progressive Kyphoscoliosis with lumbar radiculopathy s/p anterolateral decompression with fusion from L1 to L4 and posterior decompression of right side L4 and L5 nerve roots with fusion of L4/5 and segmental fixation from T12 to illium which require 3+ hours per day of interdisciplinary therapy in a comprehensive inpatient rehab setting. Physiatrist is providing close team supervision and 24 hour management of active medical problems listed below. Physiatrist and rehab team continue to assess barriers to discharge/monitor patient progress toward functional and medical goals.  Function:  Bathing Bathing position      Bathing parts      Bathing assist        Upper Body Dressing/Undressing Upper body dressing                    Upper body assist        Lower Body Dressing/Undressing Lower body dressing                                  Lower body assist        Toileting Toileting          Toileting assist     Transfers Chair/bed transfer             Locomotion Ambulation  Wheelchair          Cognition Comprehension Comprehension assist level: Follows basic conversation/direction with extra time/assistive device  Expression Expression assist level: Expresses basic needs/ideas: With extra time/assistive device  Social Interaction Social Interaction assist level: Interacts appropriately with others with medication or extra time (anti-anxiety, antidepressant).  Problem Solving Problem solving assist level: Solves basic 90% of the time/requires cueing < 10% of the time  Memory Memory assist level: More than reasonable amount of time    Medical Problem List and Plan: 1. BLE weakness with Ataxic gait, poor safety awareness and back pain secondary to Progressive Kyphoscoliosis with lumbar radiculopathy  s/p anterolateral decompression with fusion from L1 to L4 and posterior decompression of right side L4 and L5 nerve roots with fusion of L4/5 and segmental fixation from T12 to illium.  Begin CIR 2. DVT Prophylaxis/Anticoagulation: Pharmaceutical: Lovenox 3. Chronic pain/Pain Management: On neurontin bid.  4. Mood: LCSW to follow for evaluation and support.  5. Neuropsych: This patient is capable of making decisions on her own behalf. 6. Skin/Wound Care: Monitor wound for healing. Encourage po intake. Add nutritional supplements 7. Fluids/Electrolytes/Nutrition: Monitor I/O.   BMP within acceptable range on 3/14 8. Leucocytosis: Patient afebrile  WBC 17.6 on 3/14  Urine culture pending  Will continue to monitor  9. ABLA with Iron deficiency: Continue iron supplement  Hemoglobin 9.6 on 3/14 10. Fibromyalgia: On lexapro.  11. GERD: On pepcid and protonix with reasonable control  12. Constipation: Continue Senna S.  13. GU: foley DC'd   bladder training.   Urine culture pending 14. Hypoalbuminemia  Will supplement   LOS (Days) 1 A FACE TO FACE EVALUATION WAS PERFORMED  Rita Gregory Rita Gregory 06/23/2015 9:06 AM

## 2015-06-23 NOTE — Plan of Care (Signed)
Problem: RH PAIN MANAGEMENT Goal: RH STG PAIN MANAGED AT OR BELOW PT'S PAIN GOAL <4 on a scale of 1-10  Outcome: Not Progressing Pt reports pain as 7

## 2015-06-23 NOTE — Progress Notes (Signed)
Occupational Therapy Session Note  Patient Details  Name: Rita Gregory MRN: [id number] Date of Birth: [birth year]  Today's Date: 06/23/2015 OT Individual Time: 1300-1425 OT Individual Time Calculation (min): 85 min    Short Term Goals: Week 1:  OT Short Term Goal 1 (Week 1): Pt will be able to complete LB dressing with AE with min A. OT Short Term Goal 2 (Week 1): Pt will be able to tolerate standing for 2 min at sink to complete grooming.  OT Short Term Goal 3 (Week 1): Pt will be able to bathe with min A using AE. OT Short Term Goal 4 (Week 1): Pt will be able to ambulate to toilet with RW with S.  Skilled Therapeutic Interventions/Progress Updates:  Pt seen this afternoon for scheduled OT session. Pt found in bed awake agreeable to therapy session. Pt able to recall 3/3 back movement precautions. Pt donned pants while in supine following education regarding use of reacher Supine to sitting EOB using a log rolling technique with Min A using bed rails. Pt reported pain in R hip and lowered back to supine to relieve pain. Discussion and education provided on pain triggers and pain management during therapy. Pt reports heat and ice help alleviate pain. Pt returned to EOB. LSO orthosis donned while sitting EOB. Shoes donned using long handled shoe horn and total A for tying shoe laces. Pt transferred EOB to w/c using a RW and stand pivot transfer with MIN A and VCs for hand placement on RW. Pt wheeled by therapist to rehab gym. Once on therapy mat, pt reported increasing pain in R hip. Moist heat pack applied to R hip while pt in L side lying for ~10 min. Pt education and discussion provided on pain levels during various activities and education on the benefits of elastic shoe laces for LB dressing. Elastic shoe laces provided and pt voiced satisfaction with laces Pt sat up and completed lap around therapy gym to test out elastic shoe laces. Pt reported increased pain when walking and requested to  lie down again. Heat pack applied for ~3 min. Pt ambulated with RW to apartment with min A. Pt lowered to couch with MIN A and VCs for positioning and hand placement. Pt completed simulated walk in shower transfer with RW and shower stool with MIN A and VCs to step over shower stall threshold and reach back for shower bench. Pt transferred back to w/c and OT wheeled pt back to room. Pt completed toileting with MIN A for lowering to toilet and positioning to decrease pain while sitting. Pt doffed LSO sitting EOB and left in supine with kpad on and all needs in reach.   Therapy Documentation Precautions:  Precautions Precautions: Fall, Back Required Braces or Orthoses: Spinal Brace Spinal Brace: Applied in sitting position, Lumbar corset Spinal Brace Comments: patient recalled 1/3 precautions Restrictions Weight Bearing Restrictions: No Pain: Pain Assessment Pain Assessment: 0-10 Pain Score: 6  Pain Type: Surgical pain;Neuropathic pain Pain Location: Hip Pain Orientation: Right Pain Descriptors / Indicators: Aching;Constant;Spasm;Radiating;Grimacing Pain Onset: With Activity Pain Intervention(s): Heat applied;Rest;RN made aware;Repositioned ADL: ADL ADL Comments: refer to functional navigator  See Function Navigator for Current Functional Status.   Therapy/Group: Individual Therapy  Lewis, Khamia Stambaugh C 06/23/2015, 3:47 PM

## 2015-06-23 NOTE — Evaluation (Signed)
Physical Therapy Assessment and Plan  Patient Details  Name: Rita Gregory MRN: [id number] Date of Birth: Nov 11, 1945  PT Diagnosis: Abnormal posture, Abnormality of gait, Difficulty walking, Low back pain, Muscle spasms, Muscle weakness  Rehab Potential: Good ELOS: 10-12 days   Today's Date: 06/23/2015 PT Individual Time: 0800-0920 PT Individual Time Calculation (min): 80 min    Problem List:  Patient Active Problem List   Diagnosis Date Noted  . Hypoalbuminemia due to protein-calorie malnutrition ([place])   . Degenerative lumbar spinal stenosis 06/22/2015  . Weakness of both lower extremities   . Ataxia   . Gastroesophageal reflux disease without esophagitis   . Constipation due to pain medication   . S/P lumbar fusion   . Lethargy   . Postprocedural hypotension   . Benign essential HTN   . Fibromyalgia   . Chronic pain syndrome   . Post-operative pain   . Respiratory depression   . Acute blood loss anemia   . Leukocytosis   . Hypomagnesemia   . Lumbar spondylolysis   . Intractable back pain 06/12/2015  . Scoliosis of lumbar spine 06/12/2015  . Paroxysmal spells ([place]) 02/13/2013  . GERD (gastroesophageal reflux disease) 02/16/2012  . Diverticulosis 02/16/2012  . Personal history of colonic polyps 02/16/2012  . ARTHRITIS, RIGHT KNEE 03/27/2008  . BUNIONS, BILATERAL 03/27/2008  . FIBROMYALGIA 03/27/2008  . FOOT PAIN, BILATERAL 03/27/2008  . UNEQUAL LEG LENGTH 03/27/2008  . CONGENITAL PES PLANUS 03/27/2008    Past Medical History:  Past Medical History  Diagnosis Date  . Diverticulosis of colon (without mention of hemorrhage) 11/09/2007  . Colon polyp 11/09/2007    Hyperplastic   . Esophageal reflux   . Fibromyalgia   . Hypertension   . Kyphoscoliosis     40 degree bend ro right side  . Chronic pain     Dr. Ellene Route follows  . Patent foramen ovale     hx.- Dr. Lemmie Evens. Smith,cardiology- follows  . Migraine   . Paroxysmal spells ([place]) 02/13/2013   Past Surgical  History:  Past Surgical History  Procedure Laterality Date  . Appendectomy    . Cholecystectomy    . Abdominal hysterectomy    . Joint replacement      RTKA '05  . Knee arthroscopy w/ acl reconstruction      Rt. Knee  . Dry eyes      uses Restasis daily  . Cesarean section      x2  . Right rotator cuff  2011    sx  . Esophagogastroduodenoscopy  02/23/2012    Procedure: ESOPHAGOGASTRODUODENOSCOPY (EGD);  Surgeon: Irene Shipper, MD;  Location: [facility];  Service: Endoscopy;  Laterality: N/A;  . Radiology with anesthesia N/A 06/15/2015    Procedure: RADIOLOGY WITH ANESTHESIA;  Surgeon: Medication Radiologist, MD;  Location: [place];  Service: Radiology;  Laterality: N/A;  . Anterior lateral lumbar fusion 4 levels Left 06/16/2015    Procedure: ANTERIOR LATERAL LUMBAR FUSION THORACIC TWELVE-LUMBAR FOUR;  Surgeon: Kristeen Miss, MD;  Location: [facility];  Service: Neurosurgery;  Laterality: Left;  Thoracolumbar spine  . Posterior lumbar fusion 4 level N/A 06/16/2015    Procedure: POSTERIOR LUMBAR FUSION LUMBAR FOUR-FIVE LUMBAR FIVE-SACRAL ONE;  Surgeon: Kristeen Miss, MD;  Location: [place] [facility];  Service: Neurosurgery;  Laterality: N/A;    Assessment & Plan Clinical Impression: Rita Gregory is a 70 y.o. female with history for HTN, fibromyalgia, chronic pain, kyphoscoliosis with increase in curve to 40 degrees who was admitted with  intractible right buttock and back pain.and inability to ambulate on 06/12/15. Patient lives with her husband in [place], [place]. Her husband is a practicing physician for [employer]. She does not have 24/7 support at discharge. Two-level home with bedroom downstairs. She used a walker prior to admission however the past month she has been very sedentary. No local family to assist. She was admitted for pain management and required sedation for MRI which revealed progression in scoliotic stenosis on right with L4-5 and L5-S1 and L2/3 and L3/4  on left. She elected to undergo two stage procedure--anterolateral decompression with fusion from L1 to L4 followed by posterior decompression of right side L4 and L5 nerve roots with fusion of L4/5 and segmental fixation from T12 to illium with pedicle screws and rods on 06/16/15. Post op had issue with hypotension and lethargy and PCCM recommended pulmicort with limitation of narcotics to avoid sedation as well as IVF for BP support. affecting mobility. As blood pressures stabilized narcotics resumed and have been titrated to help with pain management. BP medications d/c and she was transfused with 2 units PRBC for drop in H/H to 6.2/19.1. Hyperglycemia due to steroids improving and SSI d/c per input with husband. Therapy ongoing and patient continues to be limited by pain, BLE weakness, poor safety awareness and ataxic gait.   Patient transferred to CIR on 06/22/2015.   Patient currently requires min with mobility secondary to muscle weakness and muscle joint tightness, decreased cardiorespiratoy endurance and decreased standing balance, decreased balance strategies and difficulty maintaining precautions.  Prior to hospitalization, patient was modified independent  with mobility and lived with Spouse in a House home.  Home access is 2 (back entrance)Stairs to enter.  Patient will benefit from skilled PT intervention to maximize safe functional mobility, minimize fall risk and decrease caregiver burden for planned discharge home with 24 hour supervision.  Anticipate patient will benefit from follow up [place] at discharge.  PT - End of Session Activity Tolerance: Decreased this session;Tolerates 10 - 20 min activity with multiple rests Endurance Deficit: Yes Endurance Deficit Description: required supine rest breaks due to pain/fatigue PT Assessment Rehab Potential (ACUTE/IP ONLY): Good Barriers to Discharge: Decreased caregiver support PT Patient demonstrates impairments in the following area(s):  Balance;Behavior;Edema;Endurance;Motor;Nutrition;Pain PT Transfers Functional Problem(s): Bed Mobility;Bed to Chair;Car;Furniture PT Locomotion Functional Problem(s): Ambulation;Wheelchair Mobility;Stairs PT Plan PT Intensity: Minimum of 1-2 x/day ,45 to 90 minutes PT Frequency: 5 out of 7 days PT Duration Estimated Length of Stay: 10-12 days PT Treatment/Interventions: Ambulation/gait training;Balance/vestibular training;Community reintegration;Discharge planning;Disease management/prevention;DME/adaptive equipment instruction;Functional electrical stimulation;Functional mobility training;Neuromuscular re-education;Pain management;Psychosocial support;Patient/family education;Stair training;Therapeutic Activities;Therapeutic Exercise;UE/LE Strength taining/ROM;UE/LE Coordination activities;Wheelchair propulsion/positioning PT Transfers Anticipated Outcome(s): supervision PT Locomotion Anticipated Outcome(s): supervision PT Recommendation Follow Up Recommendations: Home health PT;24 hour supervision/assistance Patient destination: Home Equipment Details: pt owns RW and [facility]  Skilled Therapeutic Intervention Skilled therapeutic intervention initiated after completion of evaluation. Discussed with patient falls risk, safety within room, and focus of therapy during stay. Discussed possible length of stay, goals, and follow-up therapy. Patient tangential and hyperverbal throughout session focused on PLOF and pain prior to surgery, requiring max cues to direct and attend to task. Patient able to recall 1/3 back precautions, reinforced education on precautions. Patient required supervision-mod A for bed mobility with max multimodal cues to maintain precautions with poor compliance due to pain and required assistance to don/doff LSO correctly each time during session. Patient supervision-min A overall using RW and required multiple prolonged supine/sidelying rest breaks due to  pain. Retrieved 16x16  wheelchair and cushion to improve patient sitting tolerance and positioning. Patient left semi reclined in bed with needs within reach and bed alarm on.   PT Evaluation Precautions/Restrictions Precautions Precautions: Fall;Back Required Braces or Orthoses: Spinal Brace Spinal Brace: Applied in sitting position;Lumbar corset General   Vital SignsOxygen Therapy SpO2: 96 % O2 Device: Not Delivered Pain Pain Assessment Pain Score: 5  Pain Type: Acute pain;Surgical pain Pain Location: Back Pain Orientation: Mid;Lower Pain Descriptors / Indicators: Jabbing;Aching (pulsing pain) Pain Onset: With Activity Home Living/Prior Functioning Home Living Available Help at Discharge: Friend(s);Available PRN/intermittently;Family (open to hiring assistance) Type of Home: House Home Access: Stairs to enter [employer] of Steps: 2 (back entrance) Home Layout: Two level;Full bath on main level;Able to live on main level with bedroom/bathroom;One level Alternate Level Stairs-Rails: Can reach both Bathroom Shower/Tub: Multimedia programmer: Standard Bathroom Accessibility: Yes Additional Comments: Spouse is MD, planning on taking first 2 weeks off  Lives With: Spouse Prior Function Level of Independence: Needs assistance with ADLs;Requires assistive device for independence;Needs assistance with homemaking;Needs assistance with tranfers;Needs assistance with gait (needed assistance with gait at night, used RW or cane during day)  Able to Take Stairs?: Yes Driving: Yes (not frequently, "only if I felt safe") Vocation: Retired Vision/Perception   No changes from baseline  Cognition Overall Cognitive Status: Impaired/Different from baseline Arousal/Alertness: Awake/alert Memory: Impaired Memory Impairment: Decreased recall of new information (difficulty with back precautions) Awareness: Impaired Awareness Impairment: Intellectual impairment (pt is not following through with  back precautions) Problem Solving: Impaired (slow processing, tangential in thought patterns) Sensation Sensation Light Touch: Appears Intact (some numbness, tingling on RLE) Stereognosis: Appears Intact Hot/Cold: Appears Intact Proprioception: Appears Intact Coordination Gross Motor Movements are Fluid and Coordinated: No Fine Motor Movements are Fluid and Coordinated: Yes Coordination and Movement Description: limited by pain/weakness Motor  Motor Motor: Abnormal postural alignment and control Motor - Skilled Clinical Observations: premorbid kyphoscoliosis, leg length discrepancy  Mobility Bed Mobility Bed Mobility: Rolling Right;Rolling Left;Supine to Sit;Sit to Supine Rolling Right: 5: Supervision Rolling Left: 5: Supervision Supine to Sit: 5: Supervision Supine to Sit Details: Verbal cues for technique;Verbal cues for precautions/safety;Visual cues/gestures for precautions/safety;Verbal cues for sequencing Sit to Supine: 3: Mod assist;5: Supervision Sit to Supine - Details: Visual cues/gestures for precautions/safety;Verbal cues for precautions/safety;Verbal cues for technique;Verbal cues for sequencing Transfers Transfers: Yes Sit to Stand: 5: Supervision;With upper extremity assist Sit to Stand Details: Verbal cues for sequencing;Verbal cues for technique Stand Pivot Transfers: 4: Min assist Stand Pivot Transfer Details: Verbal cues for sequencing;Verbal cues for technique;Verbal cues for precautions/safety Locomotion  Ambulation Ambulation: Yes Ambulation/Gait Assistance: 4: Min guard Ambulation Distance (Feet): 100 Feet Assistive device: Rolling walker Gait Gait: Yes Gait Pattern: Impaired Gait Pattern: Step-through pattern;Lateral trunk lean to right;Lateral trunk lean to left;Decreased trunk rotation (leg length discrepancy, elected to choose houseshoes instead of tennis shoes with orthotics) Gait velocity: decreased Stairs / Additional Locomotion Stairs:  Yes Stairs Assistance: 4: Min guard Stair Management Technique: Two rails;Step to pattern;Forwards Number of Stairs: 8 Height of Stairs: 3 Wheelchair Mobility Wheelchair Mobility: No (time constraints)  Trunk/Postural Assessment  Cervical Assessment Cervical Assessment: Within Functional Limits Thoracic Assessment Thoracic Assessment: Exceptions to [facility] (back precautions) Lumbar Assessment Lumbar Assessment: Exceptions to [facility] (back precautions) Postural Control Postural Control: Deficits on evaluation (delayed)  Balance Balance Balance Assessed: Yes Static Standing Balance Static Standing - Balance Support: During functional activity;Bilateral upper extremity supported Static Standing - Level of Assistance:  5: Stand by assistance Dynamic Standing Balance Dynamic Standing - Balance Support: During functional activity Dynamic Standing - Level of Assistance: 4: Min assist Extremity Assessment  RUE Assessment RUE Assessment: Within Functional Limits LUE Assessment LUE Assessment: Within Functional Limits RLE Assessment RLE Assessment: Exceptions to [facility] RLE Strength RLE Overall Strength: Deficits RLE Overall Strength Comments: grossly 4 to 5/5 throughout except 3-/5 hip flexion LLE Assessment LLE Assessment: Exceptions to [facility] LLE Strength LLE Overall Strength: Deficits LLE Overall Strength Comments: grossly 4 to 5/5 throughout except 3-/5 hip flexion   See Function Navigator for Current Functional Status.   Refer to Care Plan for Long Term Goals  Recommendations for other services: None  Discharge Criteria: Patient will be discharged from PT if patient refuses treatment 3 consecutive times without medical reason, if treatment goals not met, if there is a change in medical status, if patient makes no progress towards goals or if patient is discharged from hospital.  The above assessment, treatment plan, treatment alternatives and goals were discussed and mutually agreed  upon: by patient  Laretta Alstrom 06/23/2015, 12:59 PM

## 2015-06-23 NOTE — Progress Notes (Signed)
Patient information reviewed and entered into eRehab system by Taline Nass, RN, CRRN, PPS Coordinator.  Information including medical coding and functional independence measure will be reviewed and updated through discharge.     Per nursing patient was given "Data Collection Information Summary for Patients in Inpatient Rehabilitation Facilities with attached "Privacy Act Statement-Health Care Records" upon admission.  

## 2015-06-24 ENCOUNTER — Inpatient Hospital Stay (HOSPITAL_COMMUNITY): Payer: 59 | Admitting: Physical Therapy

## 2015-06-24 ENCOUNTER — Inpatient Hospital Stay (HOSPITAL_COMMUNITY): Payer: 59 | Admitting: Occupational Therapy

## 2015-06-24 ENCOUNTER — Encounter (HOSPITAL_COMMUNITY): Payer: Self-pay | Admitting: Neurological Surgery

## 2015-06-24 ENCOUNTER — Inpatient Hospital Stay (HOSPITAL_COMMUNITY): Payer: 59 | Admitting: *Deleted

## 2015-06-24 LAB — URINE CULTURE: Culture: >=100000

## 2015-06-24 MED ORDER — CEPHALEXIN 250 MG PO CAPS
250.0000 mg | ORAL_CAPSULE | Freq: Three times a day (TID) | ORAL | Status: DC
  Administered 2015-06-24 – 2015-07-01 (×21): 250 mg via ORAL
  Filled 2015-06-24 (×21): qty 1

## 2015-06-24 NOTE — Progress Notes (Signed)
Recreational Therapy Session Note  Patient Details  Name: Rita Gregory MRN: [id number] Date of Birth: February 27, 1946 Today's Date: 06/24/2015  Met with pt briefly today to discuss TR services.  Pt expressed she had little energy/tolerance for leisure PTA due to pain.  Prior to that pt, stated her job consumed the majority of her time.  Pt with anticipated discharge of 3/22, no further TR implemented. [place] 06/24/2015, 10:43 AM

## 2015-06-24 NOTE — Progress Notes (Signed)
Physical Therapy Session Note  Patient Details  Name: Rita Gregory MRN: [id number] Date of Birth: 04-17-1945  Today's Date: 06/24/2015 PT Individual Time: 0900-0953 PT Individual Time Calculation (min): 53 min   Short Term Goals: Week 1:  PT Short Term Goal 1 (Week 1): = LTGs due to anticipated LOS  Skilled Therapeutic Interventions/Progress Updates:    Pt received supine in bed finishing breakfast, c/o "tolerable" pain level but did not rate further, and agreeable to therapy session. Session somewhat limited by pt's hyperverbosity and difficulty redirecting to stay attended to task.  Supine>sit via log roll with verbal cues for maintaining back precautions, pt laid HOB flat to roll to R side and elevated HOB to nearly 90 degrees to come to sitting.  PT educated pt on minimizing use of bed functions, as she will not have them available to her at home, and to swing LEs off EOB to use counterweight to assist in elevating trunk.  Sit>stand from EOB with supervision and gait training x150' with RW and steady assist, intermittent supervision.  Pt reporting increased pain and discomfort in back and requesting to lie down once in therapy gym.  Pt positioned sidelying with steady assist on therapy mat with pillow between knees.  PT instructed pt in RLE sciatic nerve glides in side lying and pt reports improvement in pain and comfort.  Gait training x10' on compliant surface with RW and steady assist.  Pt requesting to use bathroom urgently so returned to room total assist in w/c.  Steady assist for toilet transfer and supervision for hygiene.  Pt positioned in w/c at sink for hand washing and to brush hair.  Handoff to OT in pt's room.   Therapy Documentation Precautions:  Precautions Precautions: Fall, Back Required Braces or Orthoses: Spinal Brace Spinal Brace: Applied in sitting position, Lumbar corset Spinal Brace Comments: patient recalled 1/3 precautions Restrictions Weight Bearing  Restrictions: No   See Function Navigator for Current Functional Status.   Therapy/Group: Individual Therapy  [name] 06/24/2015, 9:57 AM

## 2015-06-24 NOTE — Progress Notes (Signed)
[place] PHYSICAL MEDICINE & REHABILITATION     PROGRESS NOTE  Subjective/Complaints:  Discussed bladder issues, has urgency and cannot wait untiul she gets TLSO on to get OOB and to toilet or BSC at noc  ROS: Denies CP, SOB, N/V/D  Objective: Vital Signs: Blood pressure 93/61, pulse 73, temperature 98 F (36.7 C), temperature source Oral, resp. rate 18, height 4\' 11"  (1.499 m), weight 59.8 kg (131 lb 13.4 oz), SpO2 97 %. No results found.  Recent Labs  06/23/15 0532  WBC 17.6*  HGB 9.6*  HCT 30.9*  PLT 372    Recent Labs  06/23/15 0532  NA 138  K 4.3  CL 99*  GLUCOSE 141*  BUN 25*  CREATININE 0.71  CALCIUM 9.2   CBG (last 3)  No results for input(s): GLUCAP in the last 72 hours.  Wt Readings from Last 3 Encounters:  06/24/15 59.8 kg (131 lb 13.4 oz)  06/16/15 72.4 kg (159 lb 9.8 oz)  02/13/13 72.122 kg (159 lb)    Physical Exam:  BP 93/61 mmHg  Pulse 73  Temp(Src) 98 F (36.7 C) (Oral)  Resp 18  Ht 4\' 11"  (1.499 m)  Wt 59.8 kg (131 lb 13.4 oz)  BMI 26.61 kg/m2  SpO2 97% Constitutional: She appears well-developed and well-nourished.  HENT: Normocephalic and atraumatic.  Eyes: Conjunctivae and EOM are normal. No scleral icterus.  Cardiovascular: Normal rate and regular rhythm. No murmur heard. Respiratory: Effort normal and breath sounds normal. No stridor. No respiratory distress. She exhibits no tenderness.  GI: Soft. Bowel sounds are normal. She exhibits no distension. There is no tenderness.  Musculoskeletal: She exhibits no edema or tenderness.  Right lower extremity (shorter) leg length discrepancy.  Bilateral pes planus. Neurological: She is alert and oriented.  Motor: Bilateral upper extremities: 4+/5 proximal distal RLE hip flexion 4+/5, knee extension 4+/5, ankle dorsi/plantar flexion 4-/5 LLE: hip flexion 4-/5, knee extension 4/5, ankle dorsi/plantar flexion 4+/5 Sensation intact to light touch Skin: Skin is warm and dry.   Psychiatric: She has a normal mood and affect. Her behavior is normal. Thought content normal.    Assessment/Plan: 1. Functional deficits secondary to to to Progressive Kyphoscoliosis with lumbar radiculopathy s/p anterolateral decompression with fusion from L1 to L4 and posterior decompression of right side L4 and L5 nerve roots with fusion of L4/5 and segmental fixation from T12 to illium which require 3+ hours per day of interdisciplinary therapy in a comprehensive inpatient rehab setting. Physiatrist is providing close team supervision and 24 hour management of active medical problems listed below. Physiatrist and rehab team continue to assess barriers to discharge/monitor patient progress toward functional and medical goals.  Function:  Bathing Bathing position   Position: Wheelchair/chair at sink  Bathing parts Body parts bathed by patient: Right arm, Left arm, Chest, Abdomen Body parts bathed by helper: Front perineal area, Buttocks, Right upper leg, Left upper leg, Right lower leg, Left lower leg, Back  Bathing assist        Upper Body Dressing/Undressing Upper body dressing   What is the patient wearing?: Pull over shirt/dress, Orthosis     Pull over shirt/dress - Perfomed by patient: Thread/unthread right sleeve, Thread/unthread left sleeve, Put head through opening, Pull shirt over trunk       Orthosis activity level: Performed by helper (partially performed by patient)  Upper body assist        Lower Body Dressing/Undressing Lower body dressing   What is the patient wearing?: Underwear, Pants,  Non-skid slipper socks Underwear - Performed by patient: Pull underwear up/down Underwear - Performed by helper: Thread/unthread right underwear leg, Thread/unthread left underwear leg Pants- Performed by patient: Pull pants up/down Pants- Performed by helper: Thread/unthread right pants leg, Thread/unthread left pants leg   Non-skid slipper socks- Performed by helper:  Don/doff right sock, Don/doff left sock                  Lower body assist Assist for lower body dressing: Touching or steadying assistance (Pt > 75%)      Toileting Toileting   Toileting steps completed by patient: Adjust clothing prior to toileting, Performs perineal hygiene Toileting steps completed by helper: Adjust clothing prior to toileting Toileting Assistive Devices: Grab bar or rail  Toileting assist Assist level: Touching or steadying assistance (Pt.75%)   Transfers Chair/bed transfer   Chair/bed transfer method: Stand pivot Chair/bed transfer assist level: Touching or steadying assistance (Pt > 75%) Chair/bed transfer assistive device: Armrests, Medical sales representative     Max distance: 150 Assist level: Touching or steadying assistance (Pt > 75%)   Wheelchair   Type: Manual   Assist Level: Dependent (Pt equals 0%)  Cognition Comprehension Comprehension assist level: Follows complex conversation/direction with no assist  Expression Expression assist level: Expresses basic needs/ideas: With extra time/assistive device  Social Interaction Social Interaction assist level: Interacts appropriately with others with medication or extra time (anti-anxiety, antidepressant).  Problem Solving Problem solving assist level: Solves basic 75 - 89% of the time/requires cueing 10 - 24% of the time  Memory Memory assist level: Recognizes or recalls 75 - 89% of the time/requires cueing 10 - 24% of the time    Medical Problem List and Plan: 1. BLE weakness with Ataxic gait, poor safety awareness and back pain secondary to Progressive Kyphoscoliosis with lumbar radiculopathy s/p anterolateral decompression with fusion from L1 to L4 and posterior decompression of right side L4 and L5 nerve roots with fusion of L4/5 and segmental fixation from T12 to illium.  Cont CIR 2. DVT Prophylaxis/Anticoagulation: Pharmaceutical: Lovenox 3. Chronic pain/Pain Management: On  neurontin bid.  4. Mood: LCSW to follow for evaluation and support.  5. Neuropsych: This patient is capable of making decisions on her own behalf. 6. Skin/Wound Care: Monitor wound for healing. Encourage po intake. Add nutritional supplements 7. Fluids/Electrolytes/Nutrition: Monitor I/O.   BMP within acceptable range on 3/14 8. Leucocytosis: Patient afebrile  WBC 17.6 on 3/14  Urine culture + E coli, will start keflex  Will continue to monitor  9. ABLA with Iron deficiency: Continue iron supplement  Hemoglobin 9.6 on 3/14 10. Fibromyalgia: On lexapro.  11. GERD: On pepcid and protonix with reasonable control  12. Constipation: Continue Senna S.  13. GU: foley DC'd   bladder training.   Urine culture pending 14. Hypoalbuminemia  Will supplement 15.  Neurogenic bladder with urgency- Bed pan at noc  LOS (Days) 2 A FACE TO FACE EVALUATION WAS PERFORMED  Charlett Blake 06/24/2015 10:41 AM

## 2015-06-24 NOTE — Progress Notes (Signed)
Occupational Therapy Session Note  Patient Details  Name: OLIVIAGRACE TERNES MRN: [id number] Date of Birth: 15-Jul-1945  Today's Date: 06/24/2015 OT Individual Time: 0953-1001 and 1115-1200 and 1435-1500 OT Individual Time Calculation (min): 8 min and 45 min and 25 min    Short Term Goals: Week 1:  OT Short Term Goal 1 (Week 1): Pt will be able to complete LB dressing with AE with min A. OT Short Term Goal 2 (Week 1): Pt will be able to tolerate standing for 2 min at sink to complete grooming.  OT Short Term Goal 3 (Week 1): Pt will be able to bathe with min A using AE. OT Short Term Goal 4 (Week 1): Pt will be able to ambulate to toilet with RW with S.  Skilled Therapeutic Interventions/Progress Updates:    Session 1: Pt seen this AM for OT session. Pt found sitting up in w/c at sink combing hair with hand off to PT. PT voiced increased fatigue this session and requested to brush teeth and then have OT return.Pt completed oral care seated in w/c at sink with VCs to find necessary items. Pt displaying minimal cognitive deficits during task requiring cues throughout  basic ADL task. Pt left sitting up in w/c with MD present, instructed to call for nursing to assist to back to bed.  Session 2: Pt seen for OT ADL bathing/dressing session. Pt in supine upon arrival, appreciative of the rest break and agreeable tx session. She ambulated throughout room with RW and CGA. She completed bathing task seated on shower bench with supervision. Required VCs for maintaining spinal precautions during functional tasks. She declined LB dressing this session, wishing to remain in hospital gown. She requested to return to supine at end of session to rest. Left in supine with all needs in reach and friend present.   Session 3:  Pt seen this afternoon for OT session. Pt found supine awake. Pt moved to EOB using rolling technique with VCs for technique. Pt ambulated to bathroom with CGA for stability using RW. Pt  completed toileting with steadying assist and VCs for RW management in functional context. Pt ambulated back to EOB. Education on use of AE provided including therapist demonstration. Pt demonstrated understanding with MIN VCs to use AE when donning pants and sock. Pt returned to supine and left with needs in reach.   Therapy Documentation Precautions:  Precautions Precautions: Fall, Back Required Braces or Orthoses: Spinal Brace Spinal Brace: Applied in sitting position, Lumbar corset Spinal Brace Comments: patient recalled 1/3 precautions Restrictions Weight Bearing Restrictions: No Pain: Pain Assessment Pain Assessment: 0-10 Pain Score: 5  Pain Type: Acute pain;Surgical pain Pain Location: Back Pain Orientation: Mid;Lower Pain Descriptors / Indicators: Aching Pain Frequency: Intermittent Pain Onset: On-going Patients Stated Pain Goal: 2 Pain Intervention(s): Repositioned;Rest (premedicated), shower ADL: ADL ADL Comments: refer to functional navigator  See Function Navigator for Current Functional Status.   Therapy/Group: Individual Therapy  Lewis, Kermit Arnette C 06/24/2015, 7:14 AM

## 2015-06-24 NOTE — Progress Notes (Signed)
Physical Therapy Session Note  Patient Details  Name: Rita Gregory MRN: [id number] Date of Birth: 06/21/1945  Today's Date: 06/24/2015 PT Individual Time: 1500-1610 PT Individual Time Calculation (min): 70 min   Short Term Goals: Week 1:  PT Short Term Goal 1 (Week 1): = LTGs due to anticipated LOS  Skilled Therapeutic Interventions/Progress Updates:   Patient supine in bed upon arrival, transferred to edge of bed using rail with verbal cues for sequencing and to maintain precautions via log roll technique. Total A to don socks/shoes and assist for donning LSO correctly. Patient performed sit <> stand transfers using RW with min cues for hand placement and supervision. Gait training using RW x 200 ft + 50 ft + 150 ft with supervision and cues for safe stride length/heel strike. Performed simulated car transfer to sedan height using RW with supervision and max verbal cues for sequencing and technique as well as problem solving using BUE to lift BLE in/out of car. Patient reports that PTA she rode on a chaise lounge on the floor in the back of a mini [name] and husband would pull her in/out of [name], reinforced recommendation for getting in/out of front seat of sedan upon discharge. In ADL apartment, performed bed mobility on regular bed on risers to simulate home environment x 3 with initially max verbal cues to maintain precautions progressed to no cues, supervision. Trialled negotiating up/down 4 (6") stairs using 2 rails with step-to pattern, 4 stairs using L rail to simulate home environment ascending forward/descending forward with step-to pattern,and 4 stairs using L rail ascending/descending sideways with BUE support on rail with supervision. Patient reports feeling most comfortable using L rail forwards and ascending leading with LLE/descending leading with RLE. Performed NuStep using BLE only at level 1 x 10 min for strengthening/activity tolerance. Patient ambulated back to room and left sitting  on elevated commode seat over toilet, verbalized understanding to call for assistance when finished.   Therapy Documentation Precautions:  Precautions Precautions: Fall, Back Required Braces or Orthoses: Spinal Brace Spinal Brace: Applied in sitting position, Lumbar corset Spinal Brace Comments: patient recalled 1/3 precautions Restrictions Weight Bearing Restrictions: No Pain: Pain Assessment Pain Assessment: 0-10 Pain Score: 5  Pain Type: Acute pain;Surgical pain Pain Location: Back Pain Orientation: Mid;Lower Pain Descriptors / Indicators: Aching Pain Frequency: Intermittent Pain Onset: On-going Patients Stated Pain Goal: 2 Pain Intervention(s): Repositioned;Rest (premedicated)  See Function Navigator for Current Functional Status.   Therapy/Group: Individual Therapy  Laretta Alstrom 06/24/2015, 4:27 PM

## 2015-06-25 ENCOUNTER — Inpatient Hospital Stay (HOSPITAL_COMMUNITY): Payer: 59 | Admitting: Occupational Therapy

## 2015-06-25 ENCOUNTER — Inpatient Hospital Stay (HOSPITAL_COMMUNITY): Payer: 59

## 2015-06-25 DIAGNOSIS — B9620 Unspecified Escherichia coli [E. coli] as the cause of diseases classified elsewhere: Secondary | ICD-10-CM

## 2015-06-25 DIAGNOSIS — N390 Urinary tract infection, site not specified: Secondary | ICD-10-CM

## 2015-06-25 LAB — POCT I-STAT 4, (NA,K, GLUC, HGB,HCT)
Glucose, Bld: 144 mg/dL — ABNORMAL HIGH (ref 65–99)
HCT: 32 % — ABNORMAL LOW (ref 36.0–46.0)
Hemoglobin: 10.9 g/dL — ABNORMAL LOW (ref 12.0–15.0)
Potassium: 4.7 mmol/L (ref 3.5–5.1)
Sodium: 134 mmol/L — ABNORMAL LOW (ref 135–145)

## 2015-06-25 NOTE — Progress Notes (Signed)
Social Work Patient ID: [name], female   DOB: 29-Jan-1946, 70 y.o.   MRN: [id number]   [name], LCSW Social Worker Signed  Patient Care Conference 06/25/2015 12:08 PM    Expand All Collapse All   Inpatient RehabilitationTeam Conference and Plan of Care Update Date: 06/23/2015   Time: 10:00 AM     Patient Name: Rita Gregory       Medical Record Number: [id number]  Date of Birth: 16-Aug-1945 Sex: Female         Room/Bed: 4W13C/4W13C-01 Payor Info: Payor: [employer] / Plan: [place] [employer] / Product Type: *No Product type* /    Admitting Diagnosis: LUMBAR FUSION   Admit Date/Time:  06/22/2015  3:31 PM Admission Comments: No comment available   Primary Diagnosis:  <principal problem not specified> Principal Problem: <principal problem not specified>    Patient Active Problem List     Diagnosis  Date Noted   .  Hypoalbuminemia due to protein-calorie malnutrition ([place])     .  Degenerative lumbar spinal stenosis  06/22/2015   .  Weakness of both lower extremities     .  Ataxia     .  Gastroesophageal reflux disease without esophagitis     .  Constipation due to pain medication     .  S/P lumbar fusion     .  Lethargy     .  Postprocedural hypotension     .  Benign essential HTN     .  Fibromyalgia     .  Chronic pain syndrome     .  Post-operative pain     .  Respiratory depression     .  Acute blood loss anemia     .  Leukocytosis     .  Hypomagnesemia     .  Lumbar spondylolysis     .  Intractable back pain  06/12/2015   .  Scoliosis of lumbar spine  06/12/2015   .  Paroxysmal spells ([place])  02/13/2013   .  GERD (gastroesophageal reflux disease)  02/16/2012   .  Diverticulosis  02/16/2012   .  Personal history of colonic polyps  02/16/2012   .  ARTHRITIS, RIGHT KNEE  03/27/2008   .  BUNIONS, BILATERAL  03/27/2008   .  FIBROMYALGIA  03/27/2008   .  FOOT PAIN, BILATERAL  03/27/2008   .  UNEQUAL LEG LENGTH  03/27/2008   .  CONGENITAL PES PLANUS   03/27/2008     Expected Discharge Date: Expected Discharge Date: 07/01/15  Team Members Present: Physician leading conference: Dr. Delice Lesch Social Worker Present: Lennart Pall, LCSW Nurse Present: Dorien Chihuahua, RN PT Present: Carney Living, PT OT Present: Willeen Cass, OT;Roanna Epley, COTA SLP Present: Weston Anna, SLP PPS Coordinator present : Daiva Nakayama, RN, CRRN        Current Status/Progress  Goal  Weekly Team Focus   Medical     BLE weakness with Ataxic gait, poor safety awareness and back pain secondary to Progressive Kyphoscoliosis with lumbar radiculopathy s/p anterolateral decompression with fusion from L1 to L4 and posterior decompression of right side L4 and L5 nerve roots with fusion of L4/5 and segmental fixation from T12 to illium   Follow WBCs, improve pain  see above   Bowel/Bladder     Continent of bowel and bladder. LBM 06/23/15   Pt to remain continent of bowel and bladder   Monitor   Swallow/Nutrition/ Hydration  ADL's     supervision with toileting, toilet transfers; mod - max LB self care  mod I toileting, supervision bathroom transfers and set up bathing and dressing  ADL retraining, functional mobility, AE/DME training, pt/ familiy education   Mobility     supervision-min A overall except supervision-mod A bed mobility   supervision overall  maintaining back precautions, functional mobility training, safety awareness, pt education   Communication               Safety/Cognition/ Behavioral Observations              Pain     Oxy IR 10mg  q 4hrs, Tramadol 50mg  q 6hrs for discomfort   <7  Monitor for effectiveness   Skin     L flank area w/dermabond, CDI, OTA, unremarkable. Lumbar incison w/dermabond, OTA, unremarkable  No additional skin breakdown  Encourage turn q 2hrs    Rehab Goals Patient on target to meet rehab goals: Yes Rehab Goals Revised: None, as this is pt's first day on rehab.  Evaluations ongoing today. *See Care Plan  and progress notes for long and short-term goals.    Barriers to Discharge:  Improve gait, endurance, leukocytosis, pain     Possible Resolutions to Barriers:   Therapy, pt and family edu, follow labs, urine culture pending, optimize pain meds     Discharge Planning/Teaching Needs:   Pt plans to return to her home with her husband, friends, and paid caregivers to be with her 24/7 for supervision.  Pt's caregivers will have the opportunity for family education next week, closer to pt's d/c date.    Team Discussion:    Pt with recent back surgery and she is having difficulty following back precautions.  Pt has supervision level goals and is currently at min assist.  She is continent of bowel and bladder.  Pt/husband are looking at private duty care while husband is at work.     Revisions to Treatment Plan:    none    Continued Need for Acute Rehabilitation Level of Care: The patient requires daily medical management by a physician with specialized training in physical medicine and rehabilitation for the following conditions: Daily direction of a multidisciplinary physical rehabilitation program to ensure safe treatment while eliciting the highest outcome that is of practical value to the patient.: Yes Daily medical management of patient stability for increased activity during participation in an intensive rehabilitation regime.: Yes Daily analysis of laboratory values and/or radiology reports with any subsequent need for medication adjustment of medical intervention for : Post surgical problems;Neurological problems  [name], Silvestre Mesi 06/25/2015, 12:08 PM

## 2015-06-25 NOTE — Progress Notes (Signed)
Occupational Therapy Session Note  Patient Details  Name: Rita Gregory MRN: [id number] Date of Birth: [birth year]  Today's Date: 06/25/2015 OT Individual Time: 1030-1040 OT Individual Time Calculation (min): 10 min  and Today's Date: 06/25/2015 OT Missed Time: 80 Minutes Missed Time Reason: Patient unwilling/refused to participate without medical reason;Patient fatigue   Short Term Goals: Week 1:  OT Short Term Goal 1 (Week 1): Pt will be able to complete LB dressing with AE with min A. OT Short Term Goal 2 (Week 1): Pt will be able to tolerate standing for 2 min at sink to complete grooming.  OT Short Term Goal 3 (Week 1): Pt will be able to bathe with min A using AE. OT Short Term Goal 4 (Week 1): Pt will be able to ambulate to toilet with RW with S.  Skilled Therapeutic Interventions/Progress Updates:    Pt in supine upon arrival, voicing increased pain and fatigue from previous sessions. Pt requesting to have therapist return later. Pt educated regarding positioning in bed for increased comfort. Educated regarding therapy focus and muscle soreness from increased activity. Pt completed log rolling in bed for re-positioning, tactile and verbal cues required to maintain spinal precautions during bed mobility. Pt left in supine at end of session, all needs in reach. Will attempt to make up therapy time as able.   Therapy Documentation Precautions:  Precautions Precautions: Fall, Back Required Braces or Orthoses: Spinal Brace Spinal Brace: Applied in sitting position, Lumbar corset Spinal Brace Comments: patient recalled 1/3 precautions Restrictions Weight Bearing Restrictions: No Pain: Pain Assessment Pain Assessment: 0-10 Pain Score: 6   Rest, repositioned ADL: ADL ADL Comments: refer to functional navigator  See Function Navigator for Current Functional Status.   Therapy/Group: Individual Therapy  Lewis, Elvena Oyer C 06/25/2015, 7:11 AM

## 2015-06-25 NOTE — Progress Notes (Signed)
Occupational Therapy Session Note  Patient Details  Name: Rita Gregory MRN: [id number] Date of Birth: 05-05-1945  Today's Date: 06/25/2015 OT Individual Time: 0935-1004 OT Individual Time Calculation (min): 29 min    Short Term Goals: Week 1:  OT Short Term Goal 1 (Week 1): Pt will be able to complete LB dressing with AE with min A. OT Short Term Goal 2 (Week 1): Pt will be able to tolerate standing for 2 min at sink to complete grooming.  OT Short Term Goal 3 (Week 1): Pt will be able to bathe with min A using AE. OT Short Term Goal 4 (Week 1): Pt will be able to ambulate to toilet with RW with S.  Skilled Therapeutic Interventions/Progress Updates:    Pt seen for skilled OT to facilitate safe bed mobility while adhering to precautions. Pt received in bed and reviewed her progress over the last 2 days. Discussed how pt likes to sleep on couch at home, versus the bed and safe positioning with couch sleeping.  Pt worked on abdominal bracing exercises supine in bed with knees bent, working on contracting abs with an exhale 12x .  Pt then worked on bracing and rolling back to sidelying 4x, then sidelying to sit so pt could take her medications.  Pt in room with nursing staff.   Therapy Documentation Precautions:  Precautions Precautions: Fall, Back Required Braces or Orthoses: Spinal Brace Spinal Brace: Applied in sitting position, Lumbar corset Spinal Brace Comments: patient recalled 1/3 precautions Restrictions Weight Bearing Restrictions: No   Pain: Pain Assessment Pain Assessment: 0-10 Pain Score: 7  Pain Type: Acute pain;Surgical pain Pain Location: Back Pain Orientation: Mid;Lower Pain Descriptors / Indicators: Aching Pain Onset: On-going Pain Intervention(s): Repositioned ADL: ADL ADL Comments: refer to functional navigator  See Function Navigator for Current Functional Status.   Therapy/Group: Individual Therapy  [place] 06/25/2015, 12:23 PM

## 2015-06-25 NOTE — Progress Notes (Signed)
Social Work Patient ID: Rita Gregory, female   DOB: Sep 27, 1945, 70 y.o.   MRN: [id number]   CSW met with pt to update her on team conference discussion and then later spoke to pt's husband via telephone.  He was pleasantly surprised that pt is doing well enough to be discharged on 07-01-15.  CSW gave pt the private duty list for she and her husband to look over to hire someone to be with pt at home while he is working.  CSW explained that [facility] will be covered by her insurance plans.  CSW will arrange this and obtain any DME pt is going to need once therapists make their DME recommendations.  Family conference to be held with pt/husband, Dr. Naaman Plummer, and [place] 06-26-15 at [place] in [facility] conference room.  CSW will continue to follow and assist as needed.

## 2015-06-25 NOTE — Progress Notes (Signed)
[place] PHYSICAL MEDICINE & REHABILITATION     PROGRESS NOTE  Subjective/Complaints:  Had more back pain yesterday after therapies. Felt that her back locked up during therapy late in the day yesterday with associated pain. Feels better this morning. Was wondering if she needed an xray.  ROS: Denies CP, SOB, N/V/D. No insomnia, anxiety  Objective: Vital Signs: Blood pressure 107/65, pulse 79, temperature 98.3 F (36.8 C), temperature source Oral, resp. rate 20, height 4\' 11"  (1.499 m), weight 59.8 kg (131 lb 13.4 oz), SpO2 98 %. No results found.  Recent Labs  06/23/15 0532  WBC 17.6*  HGB 9.6*  HCT 30.9*  PLT 372    Recent Labs  06/23/15 0532  NA 138  K 4.3  CL 99*  GLUCOSE 141*  BUN 25*  CREATININE 0.71  CALCIUM 9.2   CBG (last 3)  No results for input(s): GLUCAP in the last 72 hours.  Wt Readings from Last 3 Encounters:  06/24/15 59.8 kg (131 lb 13.4 oz)  06/16/15 72.4 kg (159 lb 9.8 oz)  02/13/13 72.122 kg (159 lb)    Physical Exam:  BP 107/65 mmHg  Pulse 79  Temp(Src) 98.3 F (36.8 C) (Oral)  Resp 20  Ht 4\' 11"  (1.499 m)  Wt 59.8 kg (131 lb 13.4 oz)  BMI 26.61 kg/m2  SpO2 98% Constitutional: She appears well-developed and well-nourished.  HENT: Normocephalic and atraumatic.  Eyes: Conjunctivae and EOM are normal. No scleral icterus.  Cardiovascular: Normal rate and regular rhythm. No murmur heard. Respiratory: Effort normal and breath sounds normal. No stridor. No respiratory distress. She exhibits no tenderness.  GI: Soft. Bowel sounds are normal. She exhibits no distension. There is no tenderness.  Musculoskeletal: She exhibits no edema or tenderness.  Right lower extremity (shorter) leg length discrepancy.  Bilateral pes planus. Neurological: She is alert and oriented.  Motor: Bilateral upper extremities: 4+/5 proximal distal RLE hip flexion 4+/5, knee extension 4+/5, ankle dorsi/plantar flexion 4-/5 LLE: hip flexion 4-/5, knee extension  4/5, ankle dorsi/plantar flexion 4+/5 Sensation intact to light touch Skin: Skin is warm and dry. Back incision clean without drainage Psychiatric: She has a normal mood and affect. Her behavior is normal. Thought content normal.    Assessment/Plan: 1. Functional deficits secondary to to to Progressive Kyphoscoliosis with lumbar radiculopathy s/p anterolateral decompression with fusion from L1 to L4 and posterior decompression of right side L4 and L5 nerve roots with fusion of L4/5 and segmental fixation from T12 to illium which require 3+ hours per day of interdisciplinary therapy in a comprehensive inpatient rehab setting. Physiatrist is providing close team supervision and 24 hour management of active medical problems listed below. Physiatrist and rehab team continue to assess barriers to discharge/monitor patient progress toward functional and medical goals.  Function:  Bathing Bathing position   Position: Shower  Bathing parts Body parts bathed by patient: Right arm, Left arm, Chest, Abdomen, Front perineal area, Buttocks, Right upper leg, Left upper leg, Right lower leg, Left lower leg Body parts bathed by helper: Back  Bathing assist Assist Level: Touching or steadying assistance(Pt > 75%)      Upper Body Dressing/Undressing Upper body dressing   What is the patient wearing?: Pull over shirt/dress, Orthosis     Pull over shirt/dress - Perfomed by patient: Thread/unthread right sleeve, Thread/unthread left sleeve, Put head through opening, Pull shirt over trunk       Orthosis activity level: Performed by helper  Upper body assist  Lower Body Dressing/Undressing Lower body dressing   What is the patient wearing?: Pants, Socks Underwear - Performed by patient: Pull underwear up/down Underwear - Performed by helper: Thread/unthread right underwear leg, Thread/unthread left underwear leg Pants- Performed by patient: Thread/unthread right pants leg, Thread/unthread left  pants leg, Pull pants up/down Pants- Performed by helper: Thread/unthread right pants leg, Thread/unthread left pants leg Non-skid slipper socks- Performed by patient: Don/doff right sock, Don/doff left sock Non-skid slipper socks- Performed by helper: Don/doff right sock, Don/doff left sock                  Lower body assist Assist for lower body dressing: Touching or steadying assistance (Pt > 75%), Assistive device Assistive Device Comment: Reacher and sock aid    Toileting Toileting   Toileting steps completed by patient: Adjust clothing prior to toileting, Performs perineal hygiene, Adjust clothing after toileting Toileting steps completed by helper: Adjust clothing prior to toileting Toileting Assistive Devices: Grab bar or rail  Toileting assist Assist level: Supervision or verbal cues   Transfers Chair/bed transfer   Chair/bed transfer method: Ambulatory Chair/bed transfer assist level: Supervision or verbal cues Chair/bed transfer assistive device: Armrests, Medical sales representative     Max distance: 200 Assist level: Supervision or verbal cues   Wheelchair   Type: Manual   Assist Level: Dependent (Pt equals 0%)  Cognition Comprehension Comprehension assist level: Understands complex 90% of the time/cues 10% of the time  Expression Expression assist level: Expresses complex 90% of the time/cues < 10% of the time  Social Interaction Social Interaction assist level: Interacts appropriately with others with medication or extra time (anti-anxiety, antidepressant).  Problem Solving Problem solving assist level: Solves basic 75 - 89% of the time/requires cueing 10 - 24% of the time  Memory Memory assist level: Recognizes or recalls 75 - 89% of the time/requires cueing 10 - 24% of the time    Medical Problem List and Plan: 1. BLE weakness with Ataxic gait, poor safety awareness and back pain secondary to Progressive Kyphoscoliosis with lumbar radiculopathy  s/p anterolateral decompression with fusion from L1 to L4 and posterior decompression of right side L4 and L5 nerve roots with fusion of L4/5 and segmental fixation from T12 to illium.  Cont CIR  -meeting with husband tomorrow to discuss her clinical status and functional progress. 2. DVT Prophylaxis/Anticoagulation: Pharmaceutical: Lovenox 3. Chronic pain/Pain Management: On neurontin bid as well as robaxin bid.   -encouraged use of heat and ice as well  -i suspect her sx yesterday are spasm related. Exam benign today. May need to be more aggressive with robaxin in the short term 4. Mood: LCSW to follow for evaluation and support.  5. Neuropsych: This patient is capable of making decisions on her own behalf. 6. Skin/Wound Care: Monitor wound for healing. Encourage po intake. Added nutritional supplements 7. Fluids/Electrolytes/Nutrition: Monitor I/O.   BMP within acceptable range on 3/14 8. Leucocytosis: Patient afebrile  WBC 17.6 on 3/14  Urine culture + E coli, sens to keflex  -recheck cbc tomorrow  9. ABLA with Iron deficiency: Continue iron supplement  Hemoglobin 9.6 on 3/14 10. Fibromyalgia: On lexapro.  11. GERD: On pepcid and protonix with reasonable control  12. Constipation: Continue Senna S.  13. GU: foley DC'd   bladder training.   -rx uti 14. Hypoalbuminemia  Will supplement   LOS (Days) 3 A FACE TO FACE EVALUATION WAS PERFORMED  Rita Gregory T 06/25/2015 8:47 AM

## 2015-06-25 NOTE — Patient Care Conference (Signed)
Inpatient RehabilitationTeam Conference and Plan of Care Update Date: 06/23/2015   Time: 10:00 AM    Patient Name: Rita Gregory      Medical Record Number: [id number]  Date of Birth: [birth year] Sex: Female         Room/Bed: 4W13C/4W13C-01 Payor Info: Payor: [employer] EMPLOYEE / Plan: [employer] / Product Type: *No Product type* /    Admitting Diagnosis: LUMBAR FUSION  Admit Date/Time:  06/22/2015  3:31 PM Admission Comments: No comment available   Primary Diagnosis:  <principal problem not specified> Principal Problem: <principal problem not specified>  Patient Active Problem List   Diagnosis Date Noted  . Hypoalbuminemia due to protein-calorie malnutrition ([place])   . Degenerative lumbar spinal stenosis 06/22/2015  . Weakness of both lower extremities   . Ataxia   . Gastroesophageal reflux disease without esophagitis   . Constipation due to pain medication   . S/P lumbar fusion   . Lethargy   . Postprocedural hypotension   . Benign essential HTN   . Fibromyalgia   . Chronic pain syndrome   . Post-operative pain   . Respiratory depression   . Acute blood loss anemia   . Leukocytosis   . Hypomagnesemia   . Lumbar spondylolysis   . Intractable back pain 06/12/2015  . Scoliosis of lumbar spine 06/12/2015  . Paroxysmal spells ([place]) 02/13/2013  . GERD (gastroesophageal reflux disease) 02/16/2012  . Diverticulosis 02/16/2012  . Personal history of colonic polyps 02/16/2012  . ARTHRITIS, RIGHT KNEE 03/27/2008  . BUNIONS, BILATERAL 03/27/2008  . FIBROMYALGIA 03/27/2008  . FOOT PAIN, BILATERAL 03/27/2008  . UNEQUAL LEG LENGTH 03/27/2008  . CONGENITAL PES PLANUS 03/27/2008    Expected Discharge Date: Expected Discharge Date: 07/01/15  Team Members Present: Physician leading conference: Dr. Delice Lesch Social Worker Present: Lennart Pall, LCSW Nurse Present: Dorien Chihuahua, RN PT Present: Carney Living, PT OT Present: Willeen Cass, OT;Roanna Epley, COTA SLP Present:  Weston Anna, SLP PPS Coordinator present : Daiva Nakayama, RN, CRRN     Current Status/Progress Goal Weekly Team Focus  Medical   BLE weakness with Ataxic gait, poor safety awareness and back pain secondary to Progressive Kyphoscoliosis with lumbar radiculopathy s/p anterolateral decompression with fusion from L1 to L4 and posterior decompression of right side L4 and L5 nerve roots with fusion of L4/5 and segmental fixation from T12 to illium   Follow WBCs, improve pain  see above   Bowel/Bladder   Continent of bowel and bladder. LBM 06/23/15  Pt to remain continent of bowel and bladder   Monitor   Swallow/Nutrition/ Hydration             ADL's   supervision with toileting, toilet transfers; mod - max LB self care  mod I toileting, supervision bathroom transfers and set up bathing and dressing  ADL retraining, functional mobility, AE/DME training, pt/ familiy education   Mobility   supervision-min A overall except supervision-mod A bed mobility  supervision overall  maintaining back precautions, functional mobility training, safety awareness, pt education   Communication             Safety/Cognition/ Behavioral Observations            Pain   Oxy IR 10mg  q 4hrs, Tramadol 50mg  q 6hrs for discomfort  <7  Monitor for effectiveness   Skin   L flank area w/dermabond, CDI, OTA, unremarkable. Lumbar incison w/dermabond, OTA, unremarkable  No additional skin breakdown  Encourage turn q 2hrs  Rehab Goals Patient on target to meet rehab goals: Yes Rehab Goals Revised: None, as this is pt's first day on rehab.  Evaluations ongoing today. *See Care Plan and progress notes for long and short-term goals.  Barriers to Discharge: Improve gait, endurance, leukocytosis, pain    Possible Resolutions to Barriers:  Therapy, pt and family edu, follow labs, urine culture pending, optimize pain meds    Discharge Planning/Teaching Needs:  Pt plans to return to her home with her husband, friends, and  paid caregivers to be with her 24/7 for supervision.  Pt's caregivers will have the opportunity for family education next week, closer to pt's d/c date.   Team Discussion:  Pt with recent back surgery and she is having difficulty following back precautions.  Pt has supervision level goals and is currently at min assist.  She is continent of bowel and bladder.  Pt/husband are looking at private duty care while husband is at work.    Revisions to Treatment Plan:  none   Continued Need for Acute Rehabilitation Level of Care: The patient requires daily medical management by a physician with specialized training in physical medicine and rehabilitation for the following conditions: Daily direction of a multidisciplinary physical rehabilitation program to ensure safe treatment while eliciting the highest outcome that is of practical value to the patient.: Yes Daily medical management of patient stability for increased activity during participation in an intensive rehabilitation regime.: Yes Daily analysis of laboratory values and/or radiology reports with any subsequent need for medication adjustment of medical intervention for : Post surgical problems;Neurological problems  Havana Baldwin, Silvestre Mesi 06/25/2015, 12:08 PM

## 2015-06-25 NOTE — Progress Notes (Signed)
Physical Therapy Session Note  Patient Details  Name: Rita Gregory MRN: [id number] Date of Birth: 1945/05/07  Today's Date: 06/25/2015 PT Individual Time: 0800-0915 PT Individual Time Calculation (min): 75 min   Short Term Goals: Week 1:  PT Short Term Goal 1 (Week 1): = LTGs due to anticipated LOS  Skilled Therapeutic Interventions/Progress Updates:    Session focused on education and discussion about home set-up for increased independence and management upon d/c, functional bed mobility while maintaining back precautions at supervision level, standing balance for clothing management and performing oral hygiene at sink at supervision level, gait training with RW on unit with cues for increased gait speed, stair negotiation training for home entry practice and functional strengthening x 12 steps (8 steps at 3" and 4 steps at 6"), and nustep for functional strengthening and endurance of BLE and BUE on level 2 x 8 min. Pt performed mobility at overall supervision level with slow and deliberate movements noted. Transferred to toilet at end of session with NT notified patient on toilet.   Therapy Documentation Precautions:  Precautions Precautions: Fall, Back Required Braces or Orthoses: Spinal Brace Spinal Brace: Applied in sitting position, Lumbar corset Spinal Brace Comments: patient recalled 1/3 precautions Restrictions Weight Bearing Restrictions: No  Pain: Reports rough night for pain. [place] with mobility this AM and had medication.   See Function Navigator for Current Functional Status.   Therapy/Group: Individual Therapy  Canary Brim Ivory Broad, PT, DPT  06/25/2015, 9:35 AM

## 2015-06-25 NOTE — Progress Notes (Signed)
Social Work Assessment and Plan  Patient Details  Name: Rita Gregory MRN: [id number] Date of Birth: 1946-04-04  Today's Date: 06/25/2015  Problem List:  Patient Active Problem List   Diagnosis Date Noted  . Hypoalbuminemia due to protein-calorie malnutrition ([place])   . Degenerative lumbar spinal stenosis 06/22/2015  . Weakness of both lower extremities   . Ataxia   . Gastroesophageal reflux disease without esophagitis   . Constipation due to pain medication   . S/P lumbar fusion   . Lethargy   . Postprocedural hypotension   . Benign essential HTN   . Fibromyalgia   . Chronic pain syndrome   . Post-operative pain   . Respiratory depression   . Acute blood loss anemia   . Leukocytosis   . Hypomagnesemia   . Lumbar spondylolysis   . Intractable back pain 06/12/2015  . Scoliosis of lumbar spine 06/12/2015  . Paroxysmal spells ([place]) 02/13/2013  . GERD (gastroesophageal reflux disease) 02/16/2012  . Diverticulosis 02/16/2012  . Personal history of colonic polyps 02/16/2012  . ARTHRITIS, RIGHT KNEE 03/27/2008  . BUNIONS, BILATERAL 03/27/2008  . FIBROMYALGIA 03/27/2008  . FOOT PAIN, BILATERAL 03/27/2008  . UNEQUAL LEG LENGTH 03/27/2008  . CONGENITAL PES PLANUS 03/27/2008   Past Medical History:  Past Medical History  Diagnosis Date  . Diverticulosis of colon (without mention of hemorrhage) 11/09/2007  . Colon polyp 11/09/2007    Hyperplastic   . Esophageal reflux   . Fibromyalgia   . Hypertension   . Kyphoscoliosis     40 degree bend ro right side  . Chronic pain     Dr. Ellene Route follows  . Patent foramen ovale     hx.- Dr. Lemmie Evens. Smith,cardiology- follows  . Migraine   . Paroxysmal spells ([place]) 02/13/2013   Past Surgical History:  Past Surgical History  Procedure Laterality Date  . Appendectomy    . Cholecystectomy    . Abdominal hysterectomy    . Joint replacement      RTKA '05  . Knee arthroscopy w/ acl reconstruction      Rt. Knee  . Dry eyes      uses  Restasis daily  . Cesarean section      x2  . Right rotator cuff  2011    sx  . Esophagogastroduodenoscopy  02/23/2012    Procedure: ESOPHAGOGASTRODUODENOSCOPY (EGD);  Surgeon: Irene Shipper, MD;  Location: [facility];  Service: Endoscopy;  Laterality: N/A;  . Radiology with anesthesia N/A 06/15/2015    Procedure: RADIOLOGY WITH ANESTHESIA;  Surgeon: Medication Radiologist, MD;  Location: [facility];  Service: Radiology;  Laterality: N/A;  . Anterior lateral lumbar fusion 4 levels Left 06/16/2015    Procedure: ANTERIOR LATERAL LUMBAR FUSION THORACIC TWELVE-LUMBAR FOUR;  Surgeon: Kristeen Miss, MD;  Location: [facility];  Service: Neurosurgery;  Laterality: Left;  Thoracolumbar spine  . Posterior lumbar fusion 4 level N/A 06/16/2015    Procedure: POSTERIOR LUMBAR FUSION LUMBAR FOUR-FIVE LUMBAR FIVE-SACRAL ONE;  Surgeon: Kristeen Miss, MD;  Location: [facility];  Service: Neurosurgery;  Laterality: N/A;   Social History:  reports that she has been passively smoking.  She does not have any smokeless tobacco history on file. She reports that she drinks about 0.5 - 1.5 oz of alcohol per week. She reports that she does not use illicit drugs.  Family / Support Systems Marital Status: Married How Long?: 15 years Patient Roles: Spouse, Parent Spouse/Significant Other: Dr. Aniylah Avans - husband - [phone] Children: adult  children - all live out of state; 7 grandchildren Other Supports: friends Anticipated Caregiver: spouse at night and friends or hired assist during the day Ability/Limitations of Caregiver: spouse works fulltime as Arts administrator Availability: 24/7 Family Dynamics: Pt reports close relationship with husband and other family are supportive, but everyone lives out of state and closest friends are all over the world.  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   Abuse/Neglect Physical Abuse: Denies Verbal Abuse: Denies Sexual Abuse: Denies Exploitation of patient/patient's resources: Denies Self-Neglect: Denies  Emotional Status Pt's affect, behavior and adjustment status: When CSW first met pt on 06-23-15, she was frustrated to be in the hospital/rehab and this was so unexpected.  By 06-24-15, she was feeling more positive and seeing results from her rehab.  She was surprised by the program and couldn't believe that she didn't know about it prior to coming here. Recent Psychosocial Issues: Pt is missing her dog, but was able to board her with people she really trusts. Psychiatric History: none reported, but pt is on Valium and Lexapro for fibromyalgia per chart review Substance Abuse History: none reported  Patient / Family Perceptions, Expectations & Goals Pt/Family understanding of illness & functional limitations: Pt/husband seem to have a good understanding of pt's condition.  Plan is to have a family conference with the  Premorbid pt/family roles/activities: Pt is retired and was at home with her dog during the day.  She remarked that really since her retirement she's not been well. Anticipated changes in roles/activities/participation: Pt wants to return to doing what she can at home, but intends to have someone help with housework. Pt/family expectations/goals: Pt wants to get back home to her dog and be in her own environment.  Community [employer] Agencies: None Premorbid Home Care/DME Agencies: Other (Comment) (pt was using a cane PTA.) Transportation available at discharge: husband Resource referrals recommended: Neuropsychology  Discharge Planning Living  Arrangements: Spouse/significant other Support Systems: Spouse/significant other, Children, Other relatives, Friends/neighbors Type of Residence: Private residence Insurance Resources: Multimedia programmer (specify), Medicare ([employer] and Building control surveyor) Financial Resources: Radio broadcast assistant Screen Referred: No Money Management: Patient, Spouse Does the patient have any problems obtaining your medications?: No Home Management: Pt can hire people to help with housekeeping and husband can do some things when he is home from work. Patient/Family Preliminary Plans: Pt plans to hire private duty caregiver during the hours her husband is at work.  Some friends may come to help. Barriers to Discharge: Steps Social Work Anticipated Follow Up Needs: HH/OP, Other (comment) (private duty list) Expected length of stay: ELOS 10 - 12 days  Clinical Impression CSW met with pt on 06-23-15 and 06-24-15 to introduce self and role of CSW, as well as to complete assessment.  CSW later spoke with pt's husband, Dr. Tobe Sos on 06-24-15 and we set up a family conference with this CSW and Dr. Naaman Plummer for 06-26-15 at [place] would like to be present, as well.  Pt and husband have discussed hiring a private duty caregiver to stay with pt while husband is at work.  He is a pediatric endocrinologist and pt is a retired Therapist, sports from [employer].  He works long hours  and pt knows she will need someone with her for a while.  CSW gave her the private duty agency list and discussed average per hour prices.  CSW also gave pt the pt accounting number, as she was asking about itemized bills and charges, etc.  CSW spoke with pt about HH to continue therapies.  CSW will continue to follow pt and assist as needed with d/c planning post family conference discussion.  Tobi Leinweber, Silvestre Mesi 06/25/2015, 10:19 AM

## 2015-06-26 ENCOUNTER — Encounter (HOSPITAL_COMMUNITY): Payer: 59 | Admitting: Occupational Therapy

## 2015-06-26 ENCOUNTER — Inpatient Hospital Stay (HOSPITAL_COMMUNITY): Payer: 59

## 2015-06-26 ENCOUNTER — Inpatient Hospital Stay (HOSPITAL_COMMUNITY): Payer: 59 | Admitting: Occupational Therapy

## 2015-06-26 DIAGNOSIS — A499 Bacterial infection, unspecified: Secondary | ICD-10-CM

## 2015-06-26 LAB — CBC
HCT: 31.7 % — ABNORMAL LOW (ref 36.0–46.0)
Hemoglobin: 9.6 g/dL — ABNORMAL LOW (ref 12.0–15.0)
MCH: 28.2 pg (ref 26.0–34.0)
MCHC: 30.3 g/dL (ref 30.0–36.0)
MCV: 93 fL (ref 78.0–100.0)
Platelets: 461 10*3/uL — ABNORMAL HIGH (ref 150–400)
RBC: 3.41 MIL/uL — ABNORMAL LOW (ref 3.87–5.11)
RDW: 14.2 % (ref 11.5–15.5)
WBC: 18.2 10*3/uL — ABNORMAL HIGH (ref 4.0–10.5)

## 2015-06-26 MED ORDER — METHYLPHENIDATE 10 MG/9HR TD PTCH
10.0000 mg | MEDICATED_PATCH | Freq: Every day | TRANSDERMAL | Status: DC | PRN
  Administered 2015-06-28: 10 mg via TRANSDERMAL

## 2015-06-26 MED ORDER — METHYLPHENIDATE 15 MG/9HR TD PTCH
15.0000 mg | MEDICATED_PATCH | Freq: Every day | TRANSDERMAL | Status: DC
  Administered 2015-06-27: 15 mg via TRANSDERMAL

## 2015-06-26 NOTE — Progress Notes (Signed)
Physical Therapy Session Note  Patient Details  Name: [name] MRN: [id number] Date of Birth: 1945/10/30  Today's Date: 06/26/2015 PT Individual Time: 1300-1400 PT Individual Time Calculation (min): 60 min   Short Term Goals: Week 1:  PT Short Term Goal 1 (Week 1): = LTGs due to anticipated LOS  Skilled Therapeutic Interventions/Progress Updates:   Session focused on family education with patient's husband in regards to bed mobility, home set-up and modifications, stair negotiation with RW, gait with RW, donning LSO, simulated car, DME needs (recommending transport chair for community mobility not regular manual w/c), overall endurance and activity tolerance, and safety recommendations. Pt performed transfers and all mobility at supervision level except stairs (required steadying assist). Practiced up/down 2 stairs without rails (to simulate home entry and entry into family room) using RW with 2 various methods and determined best option with both pt and husband. Also recommended getting aerobic step for entry into the van due to tall height of seat and practiced this as well at supervision level to van height and car height.    Therapy Documentation Precautions:  Precautions Precautions: Fall, Back Required Braces or Orthoses: Spinal Brace Spinal Brace: Applied in sitting position, Lumbar corset Spinal Brace Comments: patient recalled 1/3 precautions Restrictions Weight Bearing Restrictions: No  Pain: No reports of pain during session.    See Function Navigator for Current Functional Status.   Therapy/Group: Individual Therapy  Canary Brim Ivory Broad, PT, DPT  06/26/2015, 3:17 PM

## 2015-06-26 NOTE — Progress Notes (Signed)
Occupational Therapy Session Note  Patient Details  Name: Rita Gregory MRN: [id number] Date of Birth: 06-07-1945  Today's Date: 06/26/2015 OT Individual Time: 1045-1200 OT Individual Time Calculation (min): 75 min    Short Term Goals: Week 1:  OT Short Term Goal 1 (Week 1): Pt will be able to complete LB dressing with AE with min A. OT Short Term Goal 2 (Week 1): Pt will be able to tolerate standing for 2 min at sink to complete grooming.  OT Short Term Goal 3 (Week 1): Pt will be able to bathe with min A using AE. OT Short Term Goal 4 (Week 1): Pt will be able to ambulate to toilet with RW with S.  Skilled Therapeutic Interventions/Progress Updates:    Pt seen for OT ADL bathing/ dressing session. Pt in supine upon arrival with husband present for caregiver training. Pt ambulated throughout unit with RW and supervision. In ADL apartment, pt completed simulated shower stall transfer at supervision level with VCs provided for technique. Extensive education provided to caregiver and pt regarding bathroom modifications/ DME including hand held shower head, grab bars, safety within the shower, need for LSO to be donned during any mobility, energy conservation, Hip kit and AE, Toilet aid, importance of mobility and participation with ADLs, DME, supervision recommendations, and d/c planning.    Pt completed simple kitchen mobility task removing and replacing items from overhead kitchen cabinet, completed with supervision. Educated regarding kitchen set-up and placing commonly used items in easy to reach areas and alternating sitting/ standing in order to build functional activity tolerance. She returned to room at end of session, requiring cues for safe RW management in order to avoid environmental obstacles. Pt completed toileting task with supervision and VCs for problem solving how to reach in order to com[;ete hygiene. Pt left in supine at end of session, all needs in reach.    Therapy  Documentation Precautions:  Precautions Precautions: Fall, Back Required Braces or Orthoses: Spinal Brace Spinal Brace: Applied in sitting position, Lumbar corset Spinal Brace Comments: patient recalled 3/3 precautions Restrictions Weight Bearing Restrictions: No Pain: Not formally assessed, however, pt did not mentilonpain during session and was able to fully participate during session.  ADL: ADL ADL Comments: refer to functional navigator  See Function Navigator for Current Functional Status.   Therapy/Group: Individual Therapy  Lewis, Huzaifa Viney C 06/26/2015, 7:19 AM

## 2015-06-26 NOTE — Progress Notes (Signed)
Occupational Therapy Session Note  Patient Details  Name: Rita Gregory MRN: [id number] Date of Birth: 1945/07/11  Today's Date: 06/26/2015 OT Individual Time: 1400-1500 OT Individual Time Calculation (min): 60 min    Short Term Goals: Week 1:  OT Short Term Goal 1 (Week 1): Pt will be able to complete LB dressing with AE with min A. OT Short Term Goal 2 (Week 1): Pt will be able to tolerate standing for 2 min at sink to complete grooming.  OT Short Term Goal 3 (Week 1): Pt will be able to bathe with min A using AE. OT Short Term Goal 4 (Week 1): Pt will be able to ambulate to toilet with RW with S.  Skilled Therapeutic Interventions/Progress Updates:    1:1 self care retraining at shower level. Focus on maintaining back precaution during undressing, bathing (in a seated position in shower) and dressing sit to stand, functional ambulation with RW, standing tolerance and activity tolerance. Pt required min cuing for safety for back precautions during bed mobility.  Functional ambulation with supervision with RW with mod cuing for proper hand placements with sit to stands. Pt able to dress with [place] with supervision with extra time. Returned to supine in bed at end of session to rest.   Therapy Documentation Precautions:  Precautions Precautions: Fall, Back Required Braces or Orthoses: Spinal Brace Spinal Brace: Applied in sitting position, Lumbar corset Spinal Brace Comments: patient recalled 1/3 precautions Restrictions Weight Bearing Restrictions: No Pain: Pain Assessment Pain Assessment: 0-10 Pain Score: 6  Pain Type: Acute pain Pain Location: Back Pain Orientation: Mid Pain Radiating Towards: left leg Pain Descriptors / Indicators: Burning Pain Frequency: Intermittent Pain Onset: On-going Patients Stated Pain Goal: 2 Pain Intervention(s): Medication (See eMAR) ADL: ADL ADL Comments: refer to functional navigator  See Function Navigator for Current Functional  Status.   Therapy/Group: Individual Therapy  Willeen Cass [facility] 06/26/2015, 3:12 PM

## 2015-06-26 NOTE — Progress Notes (Signed)
[place] PHYSICAL MEDICINE & REHABILITATION     PROGRESS NOTE  Subjective/Complaints:  Feels that she didn't get out of bed enough yesterday and she feels tight as a result. Feels that her FMS is acting up. asking if she can have her daytrana patch. .  ROS: Denies CP, SOB, N/V/D. No insomnia, anxiety  Objective: Vital Signs: Blood pressure 135/99, pulse 73, temperature 98 F (36.7 C), temperature source Oral, resp. rate 18, height 4\' 11"  (1.499 m), weight 59.8 kg (131 lb 13.4 oz), SpO2 97 %. No results found.  Recent Labs  06/26/15 0541  WBC 18.2*  HGB 9.6*  HCT 31.7*  PLT 461*   No results for input(s): NA, K, CL, GLUCOSE, BUN, CREATININE, CALCIUM in the last 72 hours.  Invalid input(s): CO CBG (last 3)  No results for input(s): GLUCAP in the last 72 hours.  Wt Readings from Last 3 Encounters:  06/24/15 59.8 kg (131 lb 13.4 oz)  06/16/15 72.4 kg (159 lb 9.8 oz)  02/13/13 72.122 kg (159 lb)    Physical Exam:  BP 135/99 mmHg  Pulse 73  Temp(Src) 98 F (36.7 C) (Oral)  Resp 18  Ht 4\' 11"  (1.499 m)  Wt 59.8 kg (131 lb 13.4 oz)  BMI 26.61 kg/m2  SpO2 97% Constitutional: She appears well-developed and well-nourished.  HENT: Normocephalic and atraumatic.  Eyes: Conjunctivae and EOM are normal. No scleral icterus.  Cardiovascular: Normal rate and regular rhythm. No murmur heard. Respiratory: Effort normal and breath sounds normal. No stridor. No respiratory distress. She exhibits no tenderness.  GI: Soft. Bowel sounds are normal. She exhibits no distension. There is no tenderness.  Musculoskeletal: She exhibits no edema or tenderness.  Right lower extremity (shorter) leg length discrepancy.  Bilateral pes planus. Neurological: She is alert and oriented.  Motor: Bilateral upper extremities: 4+/5 proximal distal RLE hip flexion 4+/5, knee extension 4+/5, ankle dorsi/plantar flexion 4-/5 LLE: hip flexion 4-/5, knee extension 4/5, ankle dorsi/plantar flexion  4+/5 Sensation intact to light touch Skin: Skin is warm and dry. Back incision clean without drainage Psychiatric: She has a normal mood and affect. Her behavior is normal. Thought content normal.    Assessment/Plan: 1. Functional deficits secondary to to to Progressive Kyphoscoliosis with lumbar radiculopathy s/p anterolateral decompression with fusion from L1 to L4 and posterior decompression of right side L4 and L5 nerve roots with fusion of L4/5 and segmental fixation from T12 to illium which require 3+ hours per day of interdisciplinary therapy in a comprehensive inpatient rehab setting. Physiatrist is providing close team supervision and 24 hour management of active medical problems listed below. Physiatrist and rehab team continue to assess barriers to discharge/monitor patient progress toward functional and medical goals.  Function:  Bathing Bathing position   Position: Shower  Bathing parts Body parts bathed by patient: Right arm, Left arm, Chest, Abdomen, Front perineal area, Buttocks, Right upper leg, Left upper leg, Right lower leg, Left lower leg Body parts bathed by helper: Back  Bathing assist Assist Level: Touching or steadying assistance(Pt > 75%)      Upper Body Dressing/Undressing Upper body dressing   What is the patient wearing?: Pull over shirt/dress, Orthosis     Pull over shirt/dress - Perfomed by patient: Thread/unthread right sleeve, Thread/unthread left sleeve, Put head through opening, Pull shirt over trunk       Orthosis activity level: Performed by helper  Upper body assist        Lower Body Dressing/Undressing Lower body dressing  What is the patient wearing?: Underwear, [employer], Socks, Shoes Underwear - Performed by patient: Thread/unthread right underwear leg, Thread/unthread left underwear leg, Pull underwear up/down Underwear - Performed by helper: Thread/unthread right underwear leg, Thread/unthread left underwear leg Pants- Performed  by patient: Thread/unthread right pants leg, Thread/unthread left pants leg, Pull pants up/down Pants- Performed by helper: Thread/unthread right pants leg, Thread/unthread left pants leg Non-skid slipper socks- Performed by patient: Don/doff right sock, Don/doff left sock Non-skid slipper socks- Performed by helper: Don/doff right sock, Don/doff left sock   Socks - Performed by helper: Don/doff right sock, Don/doff left sock   Shoes - Performed by helper: Don/doff right shoe, Don/doff left shoe          Lower body assist Assist for lower body dressing: Touching or steadying assistance (Pt > 75%), Assistive device Assistive Device Comment: Reacher and sock aid    Toileting Toileting   Toileting steps completed by patient: Adjust clothing prior to toileting, Performs perineal hygiene, Adjust clothing after toileting Toileting steps completed by helper: Adjust clothing prior to toileting Toileting Assistive Devices: Grab bar or rail  Toileting assist Assist level: Supervision or verbal cues   Transfers Chair/bed transfer   Chair/bed transfer method: Ambulatory Chair/bed transfer assist level: Supervision or verbal cues Chair/bed transfer assistive device: Armrests, Medical sales representative     Max distance: 150' Assist level: Supervision or verbal cues   Wheelchair   Type: Manual   Assist Level: Dependent (Pt equals 0%)  Cognition Comprehension Comprehension assist level: Understands complex 90% of the time/cues 10% of the time  Expression Expression assist level: Expresses complex 90% of the time/cues < 10% of the time  Social Interaction Social Interaction assist level: Interacts appropriately with others with medication or extra time (anti-anxiety, antidepressant).  Problem Solving Problem solving assist level: Solves basic 75 - 89% of the time/requires cueing 10 - 24% of the time  Memory Memory assist level: Recognizes or recalls 75 - 89% of the time/requires  cueing 10 - 24% of the time    Medical Problem List and Plan: 1. BLE weakness with Ataxic gait, poor safety awareness and back pain secondary to Progressive Kyphoscoliosis with lumbar radiculopathy s/p anterolateral decompression with fusion from L1 to L4 and posterior decompression of right side L4 and L5 nerve roots with fusion of L4/5 and segmental fixation from T12 to illium.  Cont CIR  -meeting husband and patient today to discuss progress. 2. DVT Prophylaxis/Anticoagulation: Pharmaceutical: Lovenox 3. Chronic pain/Pain Management: On neurontin bid as well as robaxin bid.   -encouraged use of heat and ice as well  -oob and exercise encouraged  -husband to bring in daytrana patch to help with fibro pain 4. Mood: LCSW to follow for evaluation and support.  5. Neuropsych: This patient is capable of making decisions on her own behalf. 6. Skin/Wound Care: Monitor wound for healing. Encourage po intake. Added nutritional supplements 7. Fluids/Electrolytes/Nutrition: Monitor I/O.   BMP within acceptable range on 3/14 8. Leucocytosis: Patient afebrile  WBC 18k today  -Urine culture + E coli, sens to keflex    9. ABLA with Iron deficiency: Continue iron supplement  Hemoglobin 9.6 on today 10. Fibromyalgia: On lexapro.  11. GERD: On pepcid and protonix with reasonable control  12. Constipation: Continue Senna S.  13. GU: foley DC'd   bladder training.   -rx uti 14. Hypoalbuminemia  Will supplement   LOS (Days) 4 A FACE TO FACE EVALUATION WAS PERFORMED  SWARTZ,ZACHARY  T 06/26/2015 8:55 AM

## 2015-06-27 ENCOUNTER — Ambulatory Visit (HOSPITAL_COMMUNITY): Payer: 59 | Admitting: Physical Therapy

## 2015-06-27 LAB — OCCULT BLOOD X 1 CARD TO LAB, STOOL: Fecal Occult Bld: NEGATIVE

## 2015-06-27 NOTE — Progress Notes (Signed)
Physical Therapy Note  Patient Details  Name: SIDNIE SELKE MRN: [id number] Date of Birth: Jul 10, 1945 Today's Date: 06/27/2015    Time: 1415-1515 60 minutes  Concurrent PT session:  Pt performed gait 150' x 2 with RW and supervision, no LOB.  Pt performed obstacle negotiation, stepping over obstacles, side and backward stepping and stair negotiation with supervision, cues for technique with side steppin.  Standing balance with supervision with 1 UE support for horseshoe toss game, no LOB.  [place] exercise program for LE strengthening and balance performed. Pt performed bed mobility and don/doff brace with supervision.   Anakaren Campion 06/27/2015, 3:27 PM

## 2015-06-27 NOTE — Progress Notes (Signed)
[facility] PHYSICAL MEDICINE & REHABILITATION     PROGRESS NOTE  Subjective/Complaints:  Feeling well today. Had a good day with husband/therapy yesterday. I met with patient along with SW as well. .  ROS: Denies CP, SOB, N/V/D. No insomnia, anxiety  Objective: Vital Signs: Blood pressure 96/63, pulse 75, temperature 98.7 F (37.1 C), temperature source Oral, resp. rate 18, height _0  (1.499 m), weight 59.8 kg (131 lb 13.4 oz), SpO2 95 %. No results found.  Recent Labs  06/26/15 0541  WBC 18.2*  HGB 9.6*  HCT 31.7*  PLT 461*   No results for input(s): NA, K, CL, GLUCOSE, BUN, CREATININE, CALCIUM in the last 72 hours.  Invalid input(s): CO CBG (last 3)  No results for input(s): GLUCAP in the last 72 hours.  Wt Readings from Last 3 Encounters:  06/24/15 59.8 kg (131 lb 13.4 oz)  06/16/15 72.4 kg (159 lb 9.8 oz)  02/13/13 72.122 kg (159 lb)    Physical Exam:  BP 96/63 mmHg  Pulse 75  Temp(Src) 98.7 F (37.1 C) (Oral)  Resp 18  Ht _1  (1.499 m)  Wt 59.8 kg (131 lb 13.4 oz)  BMI 26.61 kg/m2  SpO2 95% Constitutional: She appears well-developed and well-nourished.  HENT: Normocephalic and atraumatic.  Eyes: Conjunctivae and EOM are normal. No scleral icterus.  Cardiovascular: Normal rate and regular rhythm. No murmur heard. Respiratory: Effort normal and breath sounds normal. No stridor. No respiratory distress. She exhibits no tenderness.  GI: Soft. Bowel sounds are normal. She exhibits no distension. There is no tenderness.  Musculoskeletal: She exhibits no edema or tenderness.  Right lower extremity (shorter) leg length discrepancy.  Bilateral pes planus. Neurological: She is alert and oriented.  Motor: Bilateral upper extremities: 4+/5 proximal distal RLE hip flexion 4+/5, knee extension 4+/5, ankle dorsi/plantar flexion 4-/5 LLE: hip flexion 4-/5, knee extension 4/5, ankle dorsi/plantar flexion 4+/5 Sensation intact to light touch Skin: Skin is warm  and dry. Back incision clean without drainage Psychiatric: She has a normal mood and affect. Her behavior is normal. Thought content normal.    Assessment/Plan: 1. Functional deficits secondary to to to Progressive Kyphoscoliosis with lumbar radiculopathy s/p anterolateral decompression with fusion from L1 to L4 and posterior decompression of right side L4 and L5 nerve roots with fusion of L4/5 and segmental fixation from T12 to illium which require 3+ hours per day of interdisciplinary therapy in a comprehensive inpatient rehab setting. Physiatrist is providing close team supervision and 24 hour management of active medical problems listed below. Physiatrist and rehab team continue to assess barriers to discharge/monitor patient progress toward functional and medical goals.  Function:  Bathing Bathing position   Position: Shower  Bathing parts Body parts bathed by patient: Right arm, Left arm, Chest, Abdomen, Front perineal area, Buttocks, Right upper leg, Left upper leg Body parts bathed by helper: Right lower leg, Left lower leg, Back  Bathing assist Assist Level: Touching or steadying assistance(Pt > 75%)      Upper Body Dressing/Undressing Upper body dressing   What is the patient wearing?: Pull over shirt/dress, Orthosis     Pull over shirt/dress - Perfomed by patient: Thread/unthread right sleeve, Thread/unthread left sleeve, Put head through opening, Pull shirt over trunk       Orthosis activity level: Performed by patient  Upper body assist Assist Level: Supervision or verbal cues      Lower Body Dressing/Undressing Lower body dressing   What is the patient wearing?: Pants, Shoes Underwear -  Performed by patient: Thread/unthread right underwear leg, Thread/unthread left underwear leg, Pull underwear up/down Underwear - Performed by helper: Thread/unthread right underwear leg, Thread/unthread left underwear leg Pants- Performed by patient: Thread/unthread right pants leg,  Thread/unthread left pants leg, Pull pants up/down Pants- Performed by helper: Thread/unthread right pants leg, Thread/unthread left pants leg Non-skid slipper socks- Performed by patient: Don/doff right sock, Don/doff left sock Non-skid slipper socks- Performed by helper: Don/doff right sock, Don/doff left sock   Socks - Performed by helper: Don/doff right sock, Don/doff left sock Shoes - Performed by patient: Don/doff right shoe, Don/doff left shoe Shoes - Performed by helper: Don/doff right shoe, Don/doff left shoe          Lower body assist Assist for lower body dressing: Supervision or verbal cues Assistive Device Comment: Reacher and sock aid    Toileting Toileting   Toileting steps completed by patient: Adjust clothing prior to toileting, Performs perineal hygiene, Adjust clothing after toileting Toileting steps completed by helper: Adjust clothing prior to toileting Toileting Assistive Devices: Grab bar or rail  Toileting assist Assist level: Supervision or verbal cues   Transfers Chair/bed transfer   Chair/bed transfer method: Ambulatory Chair/bed transfer assist level: Supervision or verbal cues Chair/bed transfer assistive device: Armrests, Medical sales representative     Max distance: 200' Assist level: Supervision or verbal cues   Wheelchair   Type: Manual   Assist Level: Dependent (Pt equals 0%)  Cognition Comprehension Comprehension assist level: Understands basic 90% of the time/cues < 10% of the time  Expression Expression assist level: Expresses complex ideas: With extra time/assistive device  Social Interaction Social Interaction assist level: Interacts appropriately 75 - 89% of the time - Needs redirection for appropriate language or to initiate interaction., Interacts appropriately less than 25% of the time. May be withdrawn or combative.  Problem Solving Problem solving assist level: Solves basic 75 - 89% of the time/requires cueing 10 - 24% of  the time  Memory Memory assist level: Recognizes or recalls 75 - 89% of the time/requires cueing 10 - 24% of the time    Medical Problem List and Plan: 1. BLE weakness with Ataxic gait, poor safety awareness and back pain secondary to Progressive Kyphoscoliosis with lumbar radiculopathy s/p anterolateral decompression with fusion from L1 to L4 and posterior decompression of right side L4 and L5 nerve roots with fusion of L4/5 and segmental fixation from T12 to illium.  Cont CIR  -spent extensive time disussing numerous issues with pt/husband yesterday 2. DVT Prophylaxis/Anticoagulation: Pharmaceutical: Lovenox 3. Chronic pain/Pain Management: On neurontin bid as well as robaxin bid.   -encouraged use of heat and ice as well  -oob and exercise encouraged  -daytrana patch to help with fibro pain 4. Mood: LCSW to follow for evaluation and support.  5. Neuropsych: This patient is capable of making decisions on her own behalf. 6. Skin/Wound Care: Monitor wound for healing. Encourage po intake. Added nutritional supplements 7. Fluids/Electrolytes/Nutrition: Monitor I/O.   BMP within acceptable range on 3/14 8. Leucocytosis: Patient afebrile  WBC 18k today  -Urine culture + E coli, sens to keflex   -recheck cbc on monday 9. ABLA with Iron deficiency: Continue iron supplement  Hemoglobin 9.6  10. Fibromyalgia: On lexapro.  11. GERD: On pepcid and protonix with reasonable control  12. Constipation: Continue Senna S.  13. GU: foley DC'd   bladder training.   -rx uti 14. Hypoalbuminemia    LOS (Days) 5 A FACE TO  FACE EVALUATION WAS PERFORMED  Eytan Carrigan T 06/27/2015 11:50 AM

## 2015-06-28 ENCOUNTER — Inpatient Hospital Stay (HOSPITAL_COMMUNITY): Payer: 59 | Admitting: Physical Therapy

## 2015-06-28 ENCOUNTER — Inpatient Hospital Stay (HOSPITAL_COMMUNITY): Payer: 59

## 2015-06-28 NOTE — Progress Notes (Signed)
Occupational Therapy Session Note  Patient Details  Name: Rita Gregory MRN: [id number] Date of Birth: [birth year]  Today's Date: 06/28/2015 OT Individual Time: 0900-1015 OT Individual Time Calculation (min): 75 min    Short Term Goals: Week 1:  OT Short Term Goal 1 (Week 1): Pt will be able to complete LB dressing with AE with min A. OT Short Term Goal 2 (Week 1): Pt will be able to tolerate standing for 2 min at sink to complete grooming.  OT Short Term Goal 3 (Week 1): Pt will be able to bathe with min A using AE. OT Short Term Goal 4 (Week 1): Pt will be able to ambulate to toilet with RW with S.  Skilled Therapeutic Interventions/Progress Updates:    Pt sitting EOB upon arrival with NT present.  Pt dressed and ready for therapy.  Pt amb with RW to sink and brushed her teeth and comber her hair prior to amb with RW to ADL apartment.  Pt stated she wanted to "figure out" how to apply lotion on her feet.  A Nitrile exam glove was placed over the end of a long handle sponge and lotion applied to glove.  Pt then used long handle sponge/glove to apply lotion to her feet.  Pt was pleased with the result. Pt educated on use of sock aid.  Pt return demonstrated task and use reacher to pull sock up onto lower leg.  Pt instructed on use of reacher to don shoes.  Pt pleased with being able to accomplish tasks with use of AE and without assistance.  Pt amb in apartment to perform simple home mgmt tasks before returning to room and bed with alarm activated and all needs within reach.  Pt doffed lumbar corset without assistance and performed sit>supine with supervision.  Focus on functional amb with RW, AE use, discharge planning, activity tolerance, and safety awareness. Pt recalled 3/3 back precautions.  Therapy Documentation Precautions:  Precautions Precautions: Fall, Back Required Braces or Orthoses: Spinal Brace Spinal Brace: Applied in sitting position, Lumbar corset Spinal Brace Comments:  patient recalled 1/3 precautions Restrictions Weight Bearing Restrictions: No   Pain: Pain Assessment Pain Assessment: 0-10 Pain Score: 6  Pain Type: Acute pain Pain Location: Other (Comment) (side radiating to thigh, bilateral LE) Pain Orientation: Right Pain Intervention(s): Repositioned (pt does not want pain med now) ADL: ADL ADL Comments: refer to functional navigator See Function Navigator for Current Functional Status.   Therapy/Group: Individual Therapy  Leroy Libman 06/28/2015, 10:16 AM

## 2015-06-28 NOTE — Progress Notes (Signed)
[facility] PHYSICAL MEDICINE & REHABILITATION     PROGRESS NOTE  Subjective/Complaints:  Had some questions about anesthesia. A little restless last night.   ROS: Denies CP, SOB, N/V/D. No insomnia, anxiety  Objective: Vital Signs: Blood pressure 110/66, pulse 76, temperature 97.4 F (36.3 C), temperature source Oral, resp. rate 18, height 4\' 11"  (1.499 m), weight 59.8 kg (131 lb 13.4 oz), SpO2 96 %. No results found.  Recent Labs  06/26/15 0541  WBC 18.2*  HGB 9.6*  HCT 31.7*  PLT 461*   No results for input(s): NA, K, CL, GLUCOSE, BUN, CREATININE, CALCIUM in the last 72 hours.  Invalid input(s): CO CBG (last 3)  No results for input(s): GLUCAP in the last 72 hours.  Wt Readings from Last 3 Encounters:  06/24/15 59.8 kg (131 lb 13.4 oz)  06/16/15 72.4 kg (159 lb 9.8 oz)  02/13/13 72.122 kg (159 lb)    Physical Exam:  BP 110/66 mmHg  Pulse 76  Temp(Src) 97.4 F (36.3 C) (Oral)  Resp 18  Ht 4\' 11"  (1.499 m)  Wt 59.8 kg (131 lb 13.4 oz)  BMI 26.61 kg/m2  SpO2 96% Constitutional: She appears well-developed and well-nourished.  HENT: Normocephalic and atraumatic.  Eyes: Conjunctivae and EOM are normal. No scleral icterus.  Cardiovascular: Normal rate and regular rhythm. No murmur heard. Respiratory: Effort normal and breath sounds normal. No stridor. No respiratory distress. She exhibits no tenderness.  GI: Soft. Bowel sounds are normal. She exhibits no distension. There is no tenderness.  Musculoskeletal: She exhibits no edema or tenderness.  Right lower extremity (shorter) leg length discrepancy.  Bilateral pes planus. Neurological: She is alert and oriented.  Motor: Bilateral upper extremities: 4+/5 proximal distal RLE hip flexion 4+/5, knee extension 4+/5, ankle dorsi/plantar flexion 4-/5 LLE: hip flexion 4-/5, knee extension 4/5, ankle dorsi/plantar flexion 4+/5 Sensation intact to light touch Skin: Skin is warm and dry. Back incision clean without  drainage Psychiatric: She has a normal mood and affect. Her behavior is normal. Thought content normal.    Assessment/Plan: 1. Functional deficits secondary to to to Progressive Kyphoscoliosis with lumbar radiculopathy s/p anterolateral decompression with fusion from L1 to L4 and posterior decompression of right side L4 and L5 nerve roots with fusion of L4/5 and segmental fixation from T12 to illium which require 3+ hours per day of interdisciplinary therapy in a comprehensive inpatient rehab setting. Physiatrist is providing close team supervision and 24 hour management of active medical problems listed below. Physiatrist and rehab team continue to assess barriers to discharge/monitor patient progress toward functional and medical goals.  Function:  Bathing Bathing position   Position: Shower  Bathing parts Body parts bathed by patient: Right arm, Left arm, Chest, Abdomen, Front perineal area, Buttocks, Right upper leg, Left upper leg Body parts bathed by helper: Right lower leg, Left lower leg, Back  Bathing assist Assist Level: Touching or steadying assistance(Pt > 75%)      Upper Body Dressing/Undressing Upper body dressing   What is the patient wearing?: Pull over shirt/dress, Orthosis     Pull over shirt/dress - Perfomed by patient: Thread/unthread right sleeve, Thread/unthread left sleeve, Put head through opening, Pull shirt over trunk       Orthosis activity level: Performed by patient  Upper body assist Assist Level: Supervision or verbal cues      Lower Body Dressing/Undressing Lower body dressing   What is the patient wearing?: Pants, Shoes Underwear - Performed by patient: Thread/unthread right underwear leg, Thread/unthread  left underwear leg, Pull underwear up/down Underwear - Performed by helper: Thread/unthread right underwear leg, Thread/unthread left underwear leg Pants- Performed by patient: Thread/unthread right pants leg, Thread/unthread left pants leg, Pull  pants up/down Pants- Performed by helper: Thread/unthread right pants leg, Thread/unthread left pants leg Non-skid slipper socks- Performed by patient: Don/doff right sock, Don/doff left sock Non-skid slipper socks- Performed by helper: Don/doff right sock, Don/doff left sock   Socks - Performed by helper: Don/doff right sock, Don/doff left sock Shoes - Performed by patient: Don/doff right shoe, Don/doff left shoe Shoes - Performed by helper: Don/doff right shoe, Don/doff left shoe          Lower body assist Assist for lower body dressing: Supervision or verbal cues Assistive Device Comment: Reacher and sock aid    Toileting Toileting   Toileting steps completed by patient: Adjust clothing prior to toileting, Performs perineal hygiene, Adjust clothing after toileting Toileting steps completed by helper: Adjust clothing prior to toileting Toileting Assistive Devices: Grab bar or rail  Toileting assist Assist level: Supervision or verbal cues   Transfers Chair/bed transfer   Chair/bed transfer method: Ambulatory Chair/bed transfer assist level: Supervision or verbal cues Chair/bed transfer assistive device: Armrests, Medical sales representative     Max distance: 200' Assist level: Supervision or verbal cues   Wheelchair   Type: Manual   Assist Level: Dependent (Pt equals 0%)  Cognition Comprehension Comprehension assist level: Understands basic 90% of the time/cues < 10% of the time  Expression Expression assist level: Expresses complex ideas: With extra time/assistive device  Social Interaction Social Interaction assist level: Interacts appropriately 75 - 89% of the time - Needs redirection for appropriate language or to initiate interaction.  Problem Solving Problem solving assist level: Solves basic 75 - 89% of the time/requires cueing 10 - 24% of the time  Memory Memory assist level: Recognizes or recalls 75 - 89% of the time/requires cueing 10 - 24% of the time     Medical Problem List and Plan: 1. BLE weakness with Ataxic gait, poor safety awareness and back pain secondary to Progressive Kyphoscoliosis with lumbar radiculopathy s/p anterolateral decompression with fusion from L1 to L4 and posterior decompression of right side L4 and L5 nerve roots with fusion of L4/5 and segmental fixation from T12 to illium.  Cont CIR 2. DVT Prophylaxis/Anticoagulation: Pharmaceutical: Lovenox 3. Chronic pain/Pain Management: On neurontin bid as well as robaxin bid.   -encouraged use of heat and ice as well  -oob and exercise encouraged  -daytrana patch to help with fibro pain 4. Mood: LCSW to follow for evaluation and support.  5. Neuropsych: This patient is capable of making decisions on her own behalf. 6. Skin/Wound Care: Monitor wound for healing. Encourage po intake. Added nutritional supplements 7. Fluids/Electrolytes/Nutrition: Monitor I/O.   BMP within acceptable range on 3/14 8. Leucocytosis: Patient afebrile  WBC 18k today  -E coli UTI sens to keflex   -recheck cbc on monday 9. ABLA with Iron deficiency: Continue iron supplement  Hemoglobin 9.6  10. Fibromyalgia: On lexapro.  11. GERD: On pepcid and protonix with reasonable control  12. Constipation: Continue Senna S.  13. GU: foley DC'd   bladder training.   -rx uti 14. Hypoalbuminemia    LOS (Days) 6 A FACE TO FACE EVALUATION WAS PERFORMED  Rita Gregory T 06/28/2015 10:36 AM

## 2015-06-28 NOTE — Progress Notes (Signed)
Physical Therapy Session Note  Patient Details  Name: Rita Gregory MRN: [id number] Date of Birth: 05-18-1945  Today's Date: 06/28/2015 PT Individual Time: 3968-8648 PT Individual Time Calculation (min): 60 min   Short Term Goals: Week 1:  PT Short Term Goal 1 (Week 1): = LTGs due to anticipated LOS  Skilled Therapeutic Interventions/Progress Updates:    Pt received sitting on EOB with NT present & pt donning LSO brace. Pt reported 8/10 L abdomen/lower back pain & RN notified (pain meds received during session). PT provided cuing for pt to complete donning of LSO brace. Pt transferred to standing and ambulated to sink with RW to perform grooming tasks with supervision A. Throughout entire treatment session pt required cuing for proper hand placement on stable surface before transferring sit<>stand; pt with poor carryover of this throughout session. Pt then ambulated from room>gym 120 ft with RW & supervision demonstrating slow gait speed. Pt utilized nu-step Level 2 for 12 minutes, as pt wanted to "limber up". PT adjusted device to help reduce L abdomen/back pain; focus of activity to increase BLE strength & overall endurance. Pt reported she wanted to work on her strength, therefore pt negotiated stairs for BLE strengthening purposes. Pt able to negotiate 4 steps with Bilateral railings ~3 times and 4 steps with L ascending railing ~2 times consecutively before requiring a seated rest break. Pt then ambulated 220 ft with RW & supervision A; pt required supervision A when walking around in room with RW at end of session. At end of treatment pt left sitting on EOB with all needs met & call bell within reach.   Therapy Documentation Precautions:  Precautions Precautions: Fall, Back Required Braces or Orthoses: Spinal Brace Spinal Brace: Applied in sitting position, Lumbar corset Spinal Brace Comments: patient recalled 1/3 precautions Restrictions Weight Bearing Restrictions: No Pain: Pain  Assessment Pain Assessment: 0-10 Pain Score: 8  Pain Type: Acute pain Pain Location: Back Pain Orientation: Left;Lower Pain Descriptors / Indicators: Sore Pain Intervention(s): RN made aware;Repositioned;Ambulation/increased activity   See Function Navigator for Current Functional Status.   Therapy/Group: Individual Therapy  Waunita Schooner 06/28/2015, 5:29 PM

## 2015-06-28 NOTE — Progress Notes (Signed)
Patient/Family Conference  06-26-15 at 9am-10:15am Patient/family in attendance:  Patient and pt's husband  (Dr. Tobe Sos)  Staff in attendance:  Dr. Naaman Plummer and this CSW  Main focus:  To discuss pt's current condition, her goals for rehab, and pt's needs at home over the next several months  Synopsis of information shared:  Dr. Naaman Plummer talked about pt initially needing supervision at home for her safety.  He also shared that we would continue the therapies through home health and eventually at outpatient.  He also talked about the transitional care program that his office offers to help pt's as they return to their homes to make sure their needs are being met and to afford a return trip to the hospital, if possible, by making sure that nothing is falling through the cracks and that pt's care is managed efficiently and thoroughly. Dr. Naaman Plummer answered medical questions, as well.   Barriers/concerns expressed by patient and family:  Pt expressed concern about the 70 lb. [place] shepherd and pt's husband stated that she would stay at the kennel for a week to allow for pt to transition home safely.  Pt's husband stated that pt needs help with a plan to build up her endurance without completely depleting herself, but also not just lying around.  CSW shared that pt will continue to have therapy at home and they together with what pt has learned from CIR therapists, she would have a plan going forward and they would help her transition to outpt therapies, as well.  DME needs were discussed as well and CSW assured them that CSW would order DME prior to d/c.  Pt's husband would need to obtain hip kit on his own at the gift shop.  Patient/family response:  Pt and husband seemed to feel supported by the meeting and were looking forward to family education with therapists to ask more questions after this meeting.  Pt has a bedside commode and walker and maybe a shower stool (husband to verify stool and walker), but will  need CSW to order hospital bed, transport chair, maybe walker and stool.  They were grateful for this help and that CSW will arrange [facility] after they chose a company.  CSW had already provided private duty list and encouraged them to go ahead and start calling agencies to get this arranged, that CSW cannot do that part for them.  They understood and stated they would proceed.  Differences in private duty and Medicare [place] reviewed.  Both were appreciative of the time spent with them.  Follow-up/action plans:  Discharge date was discussed and CSW will review this with therapists to make sure pt is still on target for this date.  CSW to arrange [facility] and order DME once pt/husband chose [facility] agency and let CSW know of DME they already have at home.

## 2015-06-29 ENCOUNTER — Inpatient Hospital Stay (HOSPITAL_COMMUNITY): Payer: 59

## 2015-06-29 ENCOUNTER — Inpatient Hospital Stay (HOSPITAL_COMMUNITY): Payer: 59 | Admitting: Occupational Therapy

## 2015-06-29 LAB — BASIC METABOLIC PANEL
Anion gap: 9 (ref 5–15)
BUN: 22 mg/dL — ABNORMAL HIGH (ref 6–20)
CO2: 28 mmol/L (ref 22–32)
Calcium: 8.8 mg/dL — ABNORMAL LOW (ref 8.9–10.3)
Chloride: 101 mmol/L (ref 101–111)
Creatinine, Ser: 0.61 mg/dL (ref 0.44–1.00)
GFR calc Af Amer: >60 mL/min (ref >60)
GFR calc non Af Amer: >60 mL/min (ref >60)
Glucose, Bld: 117 mg/dL — ABNORMAL HIGH (ref 65–99)
Potassium: 3.9 mmol/L (ref 3.5–5.1)
Sodium: 138 mmol/L (ref 135–145)

## 2015-06-29 NOTE — Progress Notes (Signed)
Physical Therapy Session Note  Patient Details  Name: Rita Gregory MRN: [id number] Date of Birth: 02-23-1946  Today's Date: 06/29/2015 PT Individual Time: 1300-1330 PT Individual Time Calculation (min): 30 min   Short Term Goals: Week 1:  PT Short Term Goal 1 (Week 1): = LTGs due to anticipated LOS  Skilled Therapeutic Interventions/Progress Updates:    Session focused on finishing education on [place] [facility]P for BLE strengthening and balance as well as functional transfer and d/c planning in regards to DME recommendations. Left note for CSW in regards to equipment needs for RW and pt request manual w/c instead of transport w/c. Pt also requesting hospital bed due to positioning needs.   Therapy Documentation Precautions:  Precautions Precautions: Fall, Back Required Braces or Orthoses: Spinal Brace Spinal Brace: Applied in sitting position, Lumbar corset Spinal Brace Comments: patient recalled 1/3 precautions Restrictions Weight Bearing Restrictions: No  Pain: Premedicated for back pain.      See Function Navigator for Current Functional Status.   Therapy/Group: Individual Therapy  Canary Brim Ivory Broad, PT, DPT  06/29/2015, 3:42 PM

## 2015-06-29 NOTE — Progress Notes (Signed)
Physical Therapy Session Note  Patient Details  Name: FLORENTINE LEISTER MRN: [id number] Date of Birth: Feb 17, 1946  Today's Date: 06/29/2015 PT Individual Time: 1000-1045 PT Individual Time Calculation (min): 45 min   Short Term Goals: Week 1:  PT Short Term Goal 1 (Week 1): = LTGs due to anticipated LOS  Skilled Therapeutic Interventions/Progress Updates:   Session focused on functional dressing at edge of bed using adaptive equipment at supervision level with intermittent cues for back precautions (also forgot to put her brace on after dressing and required questioning cues for this), dynamic standing balance and functional mobility tasks including gait with RW on unit and stair negotiation with RW to simulate home entry and entry into family room, and introduced HEP for BLE strengthening and balance (only got through [place] before ran out of time, so will address in PM session and handout left with patient). Pt performed mobility at overall supervision with cues for adhering to back precautions functionally and discussed home set-up and energy conservation during rest breaks.   Therapy Documentation Precautions:  Precautions Precautions: Fall, Back Required Braces or Orthoses: Spinal Brace Spinal Brace: Applied in sitting position, Lumbar corset Spinal Brace Comments: patient recalled 1/3 precautions Restrictions Weight Bearing Restrictions: No   Pain: Premedicated for pain.    See Function Navigator for Current Functional Status.   Therapy/Group: Individual Therapy  Canary Brim Ivory Broad, PT, DPT  06/29/2015, 12:09 PM

## 2015-06-29 NOTE — Progress Notes (Signed)
Occupational Therapy Session Note  Patient Details  Name: Rita Gregory MRN: [id number] Date of Birth: [birth year]  Today's Date: 06/29/2015 OT Individual Time: 1420-1505 OT Individual Time Calculation (min): 45 min    Short Term Goals: Week 1:  OT Short Term Goal 1 (Week 1): Pt will be able to complete LB dressing with AE with min A. OT Short Term Goal 2 (Week 1): Pt will be able to tolerate standing for 2 min at sink to complete grooming.  OT Short Term Goal 3 (Week 1): Pt will be able to bathe with min A using AE. OT Short Term Goal 4 (Week 1): Pt will be able to ambulate to toilet with RW with S.  Skilled Therapeutic Interventions/Progress Updates:    ADL retraining with focus on functional transfers, self-care tasks, and adhering to back precautions during self-care tasks.  Pt requesting to take a shower.  Donned TLSO sitting at EOB, then ambulated to room shower with RW and supervision.  Pt completed toileting and bathing with setup assist.  Pt with 1 LOB when reaching in to shower to setup items, requiring min assist to recover.  Pt able to verbalize back precautions throughout session and required min cues to adhere to them with LB bathing and when reaching for items from EOB.  Pt left upright in bed with all items in reach to finish her hair.    Therapy Documentation Precautions:  Precautions Precautions: Fall, Back Required Braces or Orthoses: Spinal Brace Spinal Brace: Applied in sitting position, Lumbar corset Spinal Brace Comments: patient recalled 1/3 precautions Restrictions Weight Bearing Restrictions: No General:   Vital Signs: Therapy Vitals Temp: 97.8 F (36.6 C) Temp Source: Oral Pulse Rate: 87 Resp: 18 BP: (!) 87/62 mmHg Oxygen Therapy SpO2: 97 % O2 Device: Not Delivered Pain: Pain Assessment Pain Assessment: 0-10 Pain Score: 6  Pain Type: Acute pain Pain Location: Buttocks Pain Orientation: Right;Left Pain Radiating Towards:  L side Pain Onset:  With Activity Pain Intervention(s): Medication (See eMAR)  See Function Navigator for Current Functional Status.   Therapy/Group: Individual Therapy  Simonne Come 06/29/2015, 3:36 PM

## 2015-06-29 NOTE — Progress Notes (Signed)
[facility] PHYSICAL MEDICINE & REHABILITATION     PROGRESS NOTE  Subjective/Complaints:  Has "deeper" pain now. Problems with sleeping last night as a result.   ROS: Denies CP, SOB, N/V/D.   Objective: Vital Signs: Blood pressure 101/61, pulse 76, temperature 97.5 F (36.4 C), temperature source Oral, resp. rate 18, height 4\' 11"  (1.499 m), weight 59.8 kg (131 lb 13.4 oz), SpO2 98 %. No results found. No results for input(s): WBC, HGB, HCT, PLT in the last 72 hours. No results for input(s): NA, K, CL, GLUCOSE, BUN, CREATININE, CALCIUM in the last 72 hours.  Invalid input(s): CO CBG (last 3)  No results for input(s): GLUCAP in the last 72 hours.  Wt Readings from Last 3 Encounters:  06/24/15 59.8 kg (131 lb 13.4 oz)  06/16/15 72.4 kg (159 lb 9.8 oz)  02/13/13 72.122 kg (159 lb)    Physical Exam:  BP 101/61 mmHg  Pulse 76  Temp(Src) 97.5 F (36.4 C) (Oral)  Resp 18  Ht 4\' 11"  (1.499 m)  Wt 59.8 kg (131 lb 13.4 oz)  BMI 26.61 kg/m2  SpO2 98% Constitutional: She appears well-developed and well-nourished.  HENT: Normocephalic and atraumatic.  Eyes: Conjunctivae and EOM are normal. No scleral icterus.  Cardiovascular: Normal rate and regular rhythm. No murmur heard. Respiratory: Effort normal and breath sounds normal. No stridor. No respiratory distress. She exhibits no tenderness.  GI: Soft. Bowel sounds are normal. She exhibits no distension. There is no tenderness.  Musculoskeletal: She exhibits no edema or tenderness.  Right lower extremity (shorter) leg length discrepancy.  Bilateral pes planus. Neurological: She is alert and oriented.  Motor: Bilateral upper extremities: 4+/5 proximal distal RLE hip flexion 4+/5, knee extension 4+/5, ankle dorsi/plantar flexion 4-/5 LLE: hip flexion 4-/5, knee extension 4/5, ankle dorsi/plantar flexion 4+/5 Sensation intact to light touch Skin: Skin is warm and dry. Back incision clean without drainage Psychiatric: She has  a normal mood and affect. Her behavior is normal. Thought content normal.    Assessment/Plan: 1. Functional deficits secondary to to to Progressive Kyphoscoliosis with lumbar radiculopathy s/p anterolateral decompression with fusion from L1 to L4 and posterior decompression of right side L4 and L5 nerve roots with fusion of L4/5 and segmental fixation from T12 to illium which require 3+ hours per day of interdisciplinary therapy in a comprehensive inpatient rehab setting. Physiatrist is providing close team supervision and 24 hour management of active medical problems listed below. Physiatrist and rehab team continue to assess barriers to discharge/monitor patient progress toward functional and medical goals.  Function:  Bathing Bathing position   Position: Shower  Bathing parts Body parts bathed by patient: Right arm, Left arm, Chest, Abdomen, Front perineal area, Buttocks, Right upper leg, Left upper leg Body parts bathed by helper: Right lower leg, Left lower leg, Back  Bathing assist Assist Level: Touching or steadying assistance(Pt > 75%)      Upper Body Dressing/Undressing Upper body dressing   What is the patient wearing?: Pull over shirt/dress, Orthosis     Pull over shirt/dress - Perfomed by patient: Thread/unthread right sleeve, Thread/unthread left sleeve, Put head through opening, Pull shirt over trunk       Orthosis activity level: Performed by patient  Upper body assist Assist Level: Supervision or verbal cues      Lower Body Dressing/Undressing Lower body dressing   What is the patient wearing?: Pants, Shoes Underwear - Performed by patient: Thread/unthread right underwear leg, Thread/unthread left underwear leg, Pull underwear  up/down Underwear - Performed by helper: Thread/unthread right underwear leg, Thread/unthread left underwear leg Pants- Performed by patient: Thread/unthread right pants leg, Thread/unthread left pants leg, Pull pants up/down Pants-  Performed by helper: Thread/unthread right pants leg, Thread/unthread left pants leg Non-skid slipper socks- Performed by patient: Don/doff right sock, Don/doff left sock Non-skid slipper socks- Performed by helper: Don/doff right sock, Don/doff left sock   Socks - Performed by helper: Don/doff right sock, Don/doff left sock Shoes - Performed by patient: Don/doff right shoe, Don/doff left shoe Shoes - Performed by helper: Don/doff right shoe, Don/doff left shoe          Lower body assist Assist for lower body dressing: Supervision or verbal cues Assistive Device Comment: Reacher and sock aid    Toileting Toileting   Toileting steps completed by patient: Adjust clothing prior to toileting, Performs perineal hygiene, Adjust clothing after toileting Toileting steps completed by helper: Adjust clothing prior to toileting Toileting Assistive Devices: Grab bar or rail  Toileting assist Assist level: Supervision or verbal cues   Transfers Chair/bed transfer   Chair/bed transfer method: Ambulatory Chair/bed transfer assist level: Supervision or verbal cues Chair/bed transfer assistive device: Armrests, Medical sales representative     Max distance: 220 Assist level: Supervision or verbal cues   Wheelchair   Type: Manual   Assist Level: Dependent (Pt equals 0%)  Cognition Comprehension Comprehension assist level: Understands basic 90% of the time/cues < 10% of the time  Expression Expression assist level: Expresses complex ideas: With extra time/assistive device  Social Interaction Social Interaction assist level: Interacts appropriately 75 - 89% of the time - Needs redirection for appropriate language or to initiate interaction.  Problem Solving Problem solving assist level: Solves basic 75 - 89% of the time/requires cueing 10 - 24% of the time  Memory Memory assist level: Recognizes or recalls 75 - 89% of the time/requires cueing 10 - 24% of the time    Medical Problem List  and Plan: 1. BLE weakness with Ataxic gait, poor safety awareness and back pain secondary to Progressive Kyphoscoliosis with lumbar radiculopathy s/p anterolateral decompression with fusion from L1 to L4 and posterior decompression of right side L4 and L5 nerve roots with fusion of L4/5 and segmental fixation from T12 to illium.  Cont CIR  -pt/husband would like extension to Friday. D/w therapy regarding ongoing inpatient therapy needs.  2. DVT Prophylaxis/Anticoagulation: Pharmaceutical: Lovenox 3. Chronic pain/Pain Management: On neurontin bid as well as robaxin bid.   -encouraged use of heat and ice as well  -oob and exercise encouraged  -daytrana patch to help with fibro pain (dose per pt) 4. Mood: LCSW to follow for evaluation and support.  5. Neuropsych: This patient is capable of making decisions on her own behalf. 6. Skin/Wound Care: Monitor wound for healing. Encourage po intake. Added nutritional supplements 7. Fluids/Electrolytes/Nutrition: Monitor I/O.   BMP within acceptable range on 3/14 8. Leucocytosis: Patient afebrile  WBC 18k on Friday. Follow up labs today  -E coli UTI sens to keflex   -recheck cbc today 9. ABLA with Iron deficiency: Continue iron supplement  Hemoglobin 9.6  10. Fibromyalgia: On lexapro.  11. GERD: On pepcid and protonix with reasonable control  12. Constipation: Continue Senna S.  13. GU: foley DC'd   bladder training.   -rx uti 14. Hypoalbuminemia    LOS (Days) 7 A FACE TO FACE EVALUATION WAS PERFORMED  Kiano Terrien T 06/29/2015 7:32 AM

## 2015-06-29 NOTE — Progress Notes (Signed)
Occupational Therapy Session Note  Patient Details  Name: Rita Gregory MRN: [id number] Date of Birth: [birth year]  Today's Date: 06/29/2015 OT Individual Time: 1100-1200 OT Individual Time Calculation (min): 60 min    Short Term Goals: Week 1:  OT Short Term Goal 1 (Week 1): Pt will be able to complete LB dressing with AE with min A. OT Short Term Goal 2 (Week 1): Pt will be able to tolerate standing for 2 min at sink to complete grooming.  OT Short Term Goal 3 (Week 1): Pt will be able to bathe with min A using AE. OT Short Term Goal 4 (Week 1): Pt will be able to ambulate to toilet with RW with S.      Skilled Therapeutic Interventions/Progress Updates:    Pt seen for skilled OT to facilitate activity tolerance and functional mobility with ambulation with RW and UE strengthening with HEP. Pt provided with Level 3 theraband and a written HEP. Pt practiced each exercises and then we took videos on her phone of her completing each exercise so she could reference the videos as needed. Pt ambulated back to room and got into bed. All needs met.  Therapy Documentation Precautions:  Precautions Precautions: Fall, Back Required Braces or Orthoses: Spinal Brace Spinal Brace: Applied in sitting position, Lumbar corset Spinal Brace Comments: patient recalled 1/3 precautions Restrictions Weight Bearing Restrictions: No    Pain: Pain Assessment Pain Assessment: 0-10 Pain Score: 6  Pain Type: Acute pain Pain Location: Buttocks Pain Orientation: Right;Left Pain Radiating Towards:  L side Pain Onset: With Activity Pain Intervention(s): Medication (See eMAR) ADL: ADL ADL Comments: refer to functional navigator  See Function Navigator for Current Functional Status.   Therapy/Group: Individual Therapy  [place] 06/29/2015, 1:03 PM

## 2015-06-30 ENCOUNTER — Inpatient Hospital Stay (HOSPITAL_COMMUNITY): Payer: 59 | Admitting: Physical Therapy

## 2015-06-30 ENCOUNTER — Inpatient Hospital Stay (HOSPITAL_COMMUNITY): Payer: 59 | Admitting: Occupational Therapy

## 2015-06-30 ENCOUNTER — Inpatient Hospital Stay (HOSPITAL_COMMUNITY): Payer: 59

## 2015-06-30 LAB — CBC
HCT: 31.8 % — ABNORMAL LOW (ref 36.0–46.0)
Hemoglobin: 10.3 g/dL — ABNORMAL LOW (ref 12.0–15.0)
MCH: 30 pg (ref 26.0–34.0)
MCHC: 32.4 g/dL (ref 30.0–36.0)
MCV: 92.7 fL (ref 78.0–100.0)
Platelets: 341 10*3/uL (ref 150–400)
RBC: 3.43 MIL/uL — ABNORMAL LOW (ref 3.87–5.11)
RDW: 15 % (ref 11.5–15.5)
WBC: 9.6 10*3/uL (ref 4.0–10.5)

## 2015-06-30 NOTE — Progress Notes (Signed)
[name] PHYSICAL MEDICINE & REHABILITATION     PROGRESS NOTE  Subjective/Complaints:  Had a very good night. Pain was under control. Did well with therapies yesterday.    ROS: Denies CP, SOB, N/V/D.   Objective: Vital Signs: Blood pressure 93/55, pulse 77, temperature 97.7 F (36.5 C), temperature source Oral, resp. rate 18, height 4\' 11"  (1.499 m), weight 61.4 kg (135 lb 5.8 oz), SpO2 95 %. No results found. No results for input(s): WBC, HGB, HCT, PLT in the last 72 hours.  Recent Labs  06/29/15 0626  NA 138  K 3.9  CL 101  GLUCOSE 117*  BUN 22*  CREATININE 0.61  CALCIUM 8.8*   CBG (last 3)  No results for input(s): GLUCAP in the last 72 hours.  Wt Readings from Last 3 Encounters:  06/29/15 61.4 kg (135 lb 5.8 oz)  06/16/15 72.4 kg (159 lb 9.8 oz)  02/13/13 72.122 kg (159 lb)    Physical Exam:  BP 93/55 mmHg  Pulse 77  Temp(Src) 97.7 F (36.5 C) (Oral)  Resp 18  Ht 4\' 11"  (1.499 m)  Wt 61.4 kg (135 lb 5.8 oz)  BMI 27.33 kg/m2  SpO2 95% Constitutional: She appears well-developed and well-nourished.  HENT: Normocephalic and atraumatic.  Eyes: Conjunctivae and EOM are normal. No scleral icterus.  Cardiovascular: Normal rate and regular rhythm. No murmur heard. Respiratory: Effort normal and breath sounds normal. No stridor. No respiratory distress. She exhibits no tenderness.  GI: Soft. Bowel sounds are normal. She exhibits no distension. There is no tenderness.  Musculoskeletal: She exhibits no edema or tenderness.  Right lower extremity (shorter) leg length discrepancy.  Bilateral pes planus. Neurological: She is alert and oriented.  Motor: Bilateral upper extremities: 4+/5 proximal distal RLE hip flexion 4+/5, knee extension 4+/5, ankle dorsi/plantar flexion 4-/5 LLE: hip flexion 4-/5, knee extension 4/5, ankle dorsi/plantar flexion 4+/5 Sensation intact to light touch Skin: Skin is warm and dry. Back incision clean and dry Psychiatric: She has  a normal mood and affect. Her behavior is normal. Thought content normal.    Assessment/Plan: 1. Functional deficits secondary to to to Progressive Kyphoscoliosis with lumbar radiculopathy s/p anterolateral decompression with fusion from L1 to L4 and posterior decompression of right side L4 and L5 nerve roots with fusion of L4/5 and segmental fixation from T12 to illium which require 3+ hours per day of interdisciplinary therapy in a comprehensive inpatient rehab setting. Physiatrist is providing close team supervision and 24 hour management of active medical problems listed below. Physiatrist and rehab team continue to assess barriers to discharge/monitor patient progress toward functional and medical goals.  Function:  Bathing Bathing position   Position: Shower  Bathing parts Body parts bathed by patient: Right arm, Left arm, Chest, Abdomen, Front perineal area, Buttocks, Right upper leg, Left upper leg Body parts bathed by helper: Right lower leg, Left lower leg, Back  Bathing assist Assist Level: Touching or steadying assistance(Pt > 75%)      Upper Body Dressing/Undressing Upper body dressing   What is the patient wearing?: Pull over shirt/dress, Orthosis Bra - Perfomed by patient: Thread/unthread right bra strap, Thread/unthread left bra strap Bra - Perfomed by helper: Hook/unhook bra (pull down sports bra) Pull over shirt/dress - Perfomed by patient: Thread/unthread right sleeve, Thread/unthread left sleeve, Put head through opening, Pull shirt over trunk       Orthosis activity level: Performed by patient  Upper body assist Assist Level: Set up   Set up :  To obtain clothing/put away  Lower Body Dressing/Undressing Lower body dressing   What is the patient wearing?: Shoes Underwear - Performed by patient: Thread/unthread right underwear leg, Thread/unthread left underwear leg, Pull underwear up/down Underwear - Performed by helper: Thread/unthread right underwear leg,  Thread/unthread left underwear leg Pants- Performed by patient: Thread/unthread right pants leg, Thread/unthread left pants leg, Pull pants up/down Pants- Performed by helper: Thread/unthread right pants leg, Thread/unthread left pants leg Non-skid slipper socks- Performed by patient: Don/doff right sock, Don/doff left sock Non-skid slipper socks- Performed by helper: Don/doff right sock, Don/doff left sock Socks - Performed by patient: Don/doff right sock, Don/doff left sock Socks - Performed by helper: Don/doff right sock, Don/doff left sock Shoes - Performed by patient: Don/doff right shoe, Don/doff left shoe Shoes - Performed by helper: Fasten right, Fasten left          Lower body assist Assist for lower body dressing: Supervision or verbal cues Assistive Device Comment: Reacher and sock aid    Toileting Toileting   Toileting steps completed by patient: Adjust clothing prior to toileting, Performs perineal hygiene, Adjust clothing after toileting Toileting steps completed by helper: Adjust clothing prior to toileting Toileting Assistive Devices: Grab bar or rail  Toileting assist Assist level: Supervision or verbal cues   Transfers Chair/bed transfer   Chair/bed transfer method: Ambulatory Chair/bed transfer assist level: Supervision or verbal cues Chair/bed transfer assistive device: Armrests, Environmental consultant, Orthosis     Locomotion Ambulation     Max distance: 200' Assist level: Supervision or verbal cues   Wheelchair   Type: Manual   Assist Level: Dependent (Pt equals 0%)  Cognition Comprehension Comprehension assist level: Understands basic 90% of the time/cues < 10% of the time  Expression Expression assist level: Expresses complex ideas: With extra time/assistive device  Social Interaction Social Interaction assist level: Interacts appropriately 75 - 89% of the time - Needs redirection for appropriate language or to initiate interaction.  Problem Solving Problem solving  assist level: Solves basic 75 - 89% of the time/requires cueing 10 - 24% of the time  Memory Memory assist level: Recognizes or recalls 75 - 89% of the time/requires cueing 10 - 24% of the time    Medical Problem List and Plan: 1. BLE weakness with Ataxic gait, poor safety awareness and back pain secondary to Progressive Kyphoscoliosis with lumbar radiculopathy s/p anterolateral decompression with fusion from L1 to L4 and posterior decompression of right side L4 and L5 nerve roots with fusion of L4/5 and segmental fixation from T12 to illium.  Cont CIR  -pt/husband would like extension to Friday. D/w therapy regarding ongoing inpatient therapy needs.  2. DVT Prophylaxis/Anticoagulation: Pharmaceutical: Lovenox 3. Chronic pain/Pain Management: On neurontin bid as well as robaxin bid.   -encouraged use of heat and ice as well  -oob and exercise encouraged  -daytrana patch to help with fibro pain (dose per pt) 4. Mood: LCSW to follow for evaluation and support.  5. Neuropsych: This patient is capable of making decisions on her own behalf. 6. Skin/Wound Care: Monitor wound for healing. Encourage po intake. Added nutritional supplements 7. Fluids/Electrolytes/Nutrition: Monitor I/O.   BMP within acceptable range on 3/14 8. Leucocytosis: Patient afebrile  WBC 18k on Friday. Follow up labs today  -E coli UTI sens to keflex   -clinically doing well 9. ABLA with Iron deficiency: Continue iron supplement  Hemoglobin 9.6   -recheck cbc today 10. Fibromyalgia: On lexapro.  11. GERD: On pepcid and protonix with reasonable control  12. Constipation: Continue Senna S.  13. GU: foley DC'd   bladder training.   -rx uti 14. Hypoalbuminemia    LOS (Days) 8 A FACE TO FACE EVALUATION WAS PERFORMED  Rita Gregory T 06/30/2015 8:35 AM

## 2015-06-30 NOTE — Progress Notes (Signed)
Physical Therapy Discharge Summary  Patient Details  Name: Rita Gregory MRN: [id number] Date of Birth: 04-13-1945   Patient has met 8 of 8 long term goals due to improved activity tolerance, improved balance, decreased pain, ability to compensate for deficits and improved awareness.  Patient to discharge at an ambulatory level Supervision to modified independent using RW.   Patient's husband is independent to provide the necessary supervision/set-up assistance at discharge and successfully completed family education. Pt and family plan to hire personal care attendent.  Reasons goals not met: n/a - all goals met at this time  Recommendation:  Patient will benefit from ongoing skilled PT services in home health setting to continue to advance safe functional mobility, address ongoing impairments in gait, strength, balance, endurance, maintaining precautions, functional mobility, and minimize fall risk.  Equipment: RW, 18x16 w/c with basic cushion, hospital bed  Reasons for discharge: treatment goals met and discharge from hospital  Patient/family agrees with progress made and goals achieved: Yes  PT Discharge Precautions/Restrictions Precautions Precautions: Fall;Back Required Braces or Orthoses: Spinal Brace Spinal Brace: Applied in sitting position;Lumbar corset Spinal Brace Comments: Pt able to recall 3/3 back precautions but requires cues functionally at times Restrictions Weight Bearing Restrictions: No    Cognition Overall Cognitive Status: Within Functional Limits for tasks assessed (some cues needed at times for back precautions) Safety/Judgment: Appears intact Sensation Sensation Light Touch: Appears Intact Proprioception: Appears Intact Coordination Coordination and Movement Description: pain and weakness are much improved from admission, but [place] continues to be slow Motor  Motor Motor - Discharge Observations: premorbid kyphoscoliosis  Trunk/Postural Assessment   Cervical Assessment Cervical Assessment: Within Functional Limits Thoracic Assessment Thoracic Assessment: Exceptions to [facility] (back precautions) Lumbar Assessment Lumbar Assessment: Exceptions to [facility] (back precautions)  Balance Balance Balance Assessed: Yes Static Standing Balance Static Standing - Level of Assistance: 6: Modified independent (Device/Increase time) Dynamic Standing Balance Dynamic Standing - Level of Assistance: 6: Modified independent (Device/Increase time) (cues at times for back precautions) Extremity Assessment   see OT d/c summary for UE = WFL   RLE Strength RLE Overall Strength Comments: grossly WFL; decreased muscular endurance especially proximally LLE Strength LLE Overall Strength Comments: grossly WFL; decreased muscular endurance especially proximally   See Function Navigator for Current Functional Status.  Canary Brim Ivory Broad, PT, DPT  06/30/2015, 4:00 PM

## 2015-06-30 NOTE — Progress Notes (Signed)
Occupational Therapy Discharge Summary  Patient Details  Name: Rita Gregory MRN: [id number] Date of Birth: Aug 17, 1945  Patient has met 11 of 11 long term goals due to improved activity tolerance, improved balance, postural control, ability to compensate for deficits and improved awareness.  Patient to discharge at overall Supervision level.  Patient's care partner is independent to provide the necessary physical assistance at discharge.    Reasons goals not met: n/a  Recommendation:  Patient will benefit from ongoing skilled OT services in home health setting to continue to advance functional skills in the area of BADL and iADL.  Equipment: Pt has all the necessary DME/AE.  Reasons for discharge: treatment goals met  Patient/family agrees with progress made and goals achieved: Yes  OT Discharge Precautions/Restrictions  Precautions Precautions: Fall;Back Spinal Brace: Applied in sitting position;Lumbar corset Restrictions Weight Bearing Restrictions: No  Pain Pain Assessment Pain Assessment: 0-10 Pain Score: 6  Pain Type: Acute pain Pain Location: Back Pain Orientation: Left Pain Descriptors / Indicators: Aching Pain Frequency: Constant Pain Onset: On-going Patients Stated Pain Goal: 2 Pain Intervention(s): Medication (See eMAR) ADL ADL ADL Comments: Supervision to maintain back precautions, can toilet with mod I Vision/Perception  Vision- History Baseline Vision/History: Wears glasses Patient Visual Report: No change from baseline Vision- Assessment Vision Assessment?: No apparent visual deficits  Cognition Overall Cognitive Status: Within Functional Limits for tasks assessed (occasionally needs cues to follow back prec during ADLs) Orientation Level: Oriented X4 Sensation Sensation Light Touch: Appears Intact Stereognosis: Appears Intact Hot/Cold: Appears Intact Proprioception: Appears Intact Coordination Gross Motor Movements are Fluid and  Coordinated: No Fine Motor Movements are Fluid and Coordinated: Yes Coordination and Movement Description: pain and weakness are much improved from admission, but Gregory continues to be slow Glass blower/designer - Discharge Observations: premorbid kyphoscoliosis Mobility    supervision for safety with transfers Trunk/Postural Assessment    back precautions, kyphotic posture Balance Static Standing Balance Static Standing - Level of Assistance: 6: Modified independent (Device/Increase time) Dynamic Standing Balance Dynamic Standing - Balance Support: During functional activity (during toileting tasks) Dynamic Standing - Level of Assistance: 6: Modified independent (Device/Increase time) Extremity/Trunk Assessment RUE Assessment RUE Assessment: Within Functional Limits LUE Assessment LUE Assessment: Within Functional Limits   See Function Navigator for Current Functional Status.  [place] 06/30/2015, 11:22 AM

## 2015-06-30 NOTE — Consult Note (Addendum)
NEUROBEHAVIORAL STATUS EXAM - [facility]   MEDICAL NECESSITY:  Rita Gregory was seen on the [facility] for a neurobehavioral status exam to assist in treatment planning during admission.   Records indicate that Rita Gregory is a "70 y.o. female with history for HTN, fibromyalgia, chronic pain, kyphoscoliosis with increase in curve to 40 degrees who was admitted with intractable right buttock and back pain and inability to ambulate on 06/12/15. Patient lives with her husband in [place], [place]. Her husband is a practicing physician for [employer].  She does not have 24/7 support at discharge.  Two-level home with bedroom downstairs. She used a walker prior to admission however the past month she has been very sedentary. No local family to assist. She was admitted for pain management and required sedation for MRI which revealed progression in scoliotic stenosis on right with L4-5 and L5-S1 and L2/3 and L3/4 on left. She elected to undergo two stage procedure--anterolateral decompression with fusion from L1 to L4 followed by posterior decompression of right side L4 and L5 nerve roots with fusion of L4/5 and segmental fixation from T12 to illium with pedicle screws and rods on 06/16/15. Post op had issue with hypotension and lethargy and PCCM recommended pulmicort with limitation of narcotics to avoid sedation as well as IVF for BP support.As blood pressures stabilized narcotics resumed and have been titrated to help with pain management. BP medications d/c and she was transfused with 2 units PRBC for drop in H/H to 6.2/19.1.  Hyperglycemia due to steroids improving and SSI d/c per input with husband.  Therapy ongoing and patient continues to be limited by pain, BLE weakness, poor safety awareness and ataxic gait."  During today's visit, Rita Gregory was a very good historian. She reported a history of significant  orthopedic issues involving her spine. She recently underwent back surgery which was a success. She has multiple medication sensitivities particularly for those that are used for pain management (e.g., narcotics). As such, she has been living with a severe degree of pain for quite some time.   From a cognitive perspective, Rita Gregory reported suffering from "adult ADD" which is treated via methylphenidate that she finds to be effective. She is followed by a neuropsychologist in [place]. She denied suffering from any cognitive deficits in relation to her present medical situation. However, she did suffer what sounds like delirium and hallucinations that have resolved.   From an emotional standpoint, Rita Gregory described herself to be in good spirits. She was previously "terrified" about undergoing the surgery but is very pleased with the results. She was initially convinced that she would not survive the surgery and that kept her from following through with it until now. All of her anxiety and stress have generally abated and the last remaining stressor is figuring out her transition home since her husband will not be able to take off much work; he is a Occupational psychologist. Patient is treated for chronic fibromyalgia via Lexapro and also mentioned that the methylphenidate is helpful for the cognitive sequelae secondary to the condition. She attempted Cymbalta in the past but it also caused adverse side effects. No adjustment issues endorsed. Suicidal/homicidal ideation, plan or intent was denied. No manic or hypomanic episodes were reported. The patient denied ever experiencing any auditory/visual hallucinations. No major behavioral or personality changes were endorsed.   Rita Gregory feels that she has made excellent progress in therapy. She has been very satisfied with the  rehab staff describing them as "so well trained." As mentioned, she is attempting to figure out her transition home and her social worker  is purportedly helping with this endeavor. Patient is a retired Furniture conservator/restorer.   PROCEDURES: [2 units 96116] Diagnostic clinical interview  Review of available records  MENTAL STATUS EXAM: APPEARANCE:  Normal/appropriate GEN:  Alert and oriented MOOD:  Euthymic/bright AFFECT:  Appropriately modulated  SPEECH:  Clear     THOUGHT CONTENT:  Appropriate HALLUCINATIONS:  None INTELLIGENCE:  Above average  INSIGHT:  Good JUDGMENT:  Good SUICIDAL IDEATION:  Denies SI   HOMICIDAL IDEATION:   Denies HI   IMPRESSION: Overall, Rita Gregory reported suffering from chronic attention deficits that are treated via methylphenidate. She was experiencing post-surgery delirium that has resolved. No lasting issues with depression or anxiety endorsed and there is no major history of mental illness. Patient suffers from Fibromyalgia that is appropriately treated. A majority of the time was spent validating the patient's experience and offering support. She was also provided with strategies on how to be more cognizant and aware of safety issues. Rita Gregory was encouraged to call upon neuropsychology if she notes a change in mood or cognition. Otherwise, no formally follow-up is required.       Rutha Bouchard, Psy.D.  Clinical Neuropsychologist

## 2015-06-30 NOTE — Progress Notes (Signed)
Occupational Therapy Note  Patient Details  Name: SHYLAN MATCZAK MRN: [id number] Date of Birth: 01-Jun-1945  Today's Date: 06/30/2015 OT Individual Time: [id number] OT Individual Time Calculation (min): 30 min  and Today's Date: 06/30/2015  1:1 Focus on activity tolerance and standing balance. Pt able to ambulate from her room to the ADL apartment and back with RW at mod I level.  Problem solved and demonstrated understanding with simple meal prep at ambulatory level with RW and at w/c level to ensure ability to maintain back precautions. Pt got back into bed mod I and doff pants and bra to relax.  Pt reports 6/10 pain but able to continue. Rn aware and pt had meds before began.  Willeen Cass [facility] 06/30/2015, 10:48 AM

## 2015-06-30 NOTE — Progress Notes (Signed)
Occupational Therapy Session Note  Patient Details  Name: Rita Gregory MRN: [id number] Date of Birth: 23-May-1945  Today's Date: 06/30/2015 OT Individual Time: 3254-9826 OT Individual Time Calculation (min): 60 min    Short Term Goals: Week 1:  OT Short Term Goal 1 (Week 1): Pt will be able to complete LB dressing with AE with min A. OT Short Term Goal 2 (Week 1): Pt will be able to tolerate standing for 2 min at sink to complete grooming.  OT Short Term Goal 3 (Week 1): Pt will be able to bathe with min A using AE. OT Short Term Goal 4 (Week 1): Pt will be able to ambulate to toilet with RW with S.     Skilled Therapeutic Interventions/Progress Updates:    Pt seen for skilled OT to facilitate functional mobility with safe transfers, maintaining back precautions, and use of AE during ADLs.  Pt was very independent with all self care using AE and did not require any physical A, but she does need cuing 25% of the time to follow her back precautions.   She knows what they are, but forgets to implement them. She did work on Product/process development scientist to reach to retrieve items out of a drawer.  With her brace on already, she is safer with toileting and therefore can be mod I to toilet once she is in the bathroom.  Pt completed all self care and in room with all needs met.  Therapy Documentation Precautions:  Precautions Precautions: Fall, Back Required Braces or Orthoses: Spinal Brace Spinal Brace: Applied in sitting position, Lumbar corset Restrictions Weight Bearing Restrictions: No    Vital Signs: Oxygen Therapy SpO2: 98 % O2 Device: Not Delivered FiO2 (%): 21 % Pain: Pain Assessment Pain Assessment: 0-10 Pain Score: 6  Pain Type: Acute pain Pain Location: Back Pain Orientation: Left Pain Descriptors / Indicators: Aching Pain Frequency: Constant Pain Onset: On-going Patients Stated Pain Goal: 2 Pain Intervention(s): Medication (See eMAR) ADL: ADL ADL Comments: refer to  functional navigator  See Function Navigator for Current Functional Status.   Therapy/Group: Individual Therapy  SAGUIER,JULIA 06/30/2015, 11:11 AM

## 2015-06-30 NOTE — Progress Notes (Signed)
Physical Therapy Session Note  Patient Details  Name: Rita Gregory MRN: [id number] Date of Birth: 12-30-1945  Today's Date: 06/30/2015 PT Individual Time: 1515-1550 PT Individual Time Calculation (min): 35 min  10 minute refusal.  Short Term Goals: Week 1:  PT Short Term Goal 1 (Week 1): = LTGs due to anticipated LOS  Skilled Therapeutic Interventions/Progress Updates:    Pt received in bed & agreeable to PT. Pt able to transfer supine>sitting EOB with Mod I and don LSO brace in sitting independently. Pt ambulated throughout room & to bathroom with RW & Mod I. Pt completed berg balance test, unable to complete a select few activities 2/2 back precautions. Patient demonstrates increased fall risk as noted by score of  33/56 on Berg Balance Scale.  (<36= high risk for falls, close to 100%; 37-45 significant >80%; 46-51 moderate >50%; 52-55 lower >25%) Educated pt on berg score & interpretation. Pt utilized nu-step for 5 minutes on Level 3 without c/o increased back pain. At end of session pt ambulated back to room & left with all needs met. Pt refused 10 minutes of treatment 2/2 wanting to prepare some things before her husband arrived.   Therapy Documentation Precautions:  Precautions Precautions: Fall, Back Required Braces or Orthoses: Spinal Brace Spinal Brace: Applied in sitting position, Lumbar corset Spinal Brace Comments: Pt able to recall 3/3 back precautions but requires cues functionally at times Restrictions Weight Bearing Restrictions: No  Pain: Pain Assessment Pain Assessment: 0-10 Pain Score: 6  Pain Location: Back Pain Orientation: Lower Pain Descriptors / Indicators: Sore Pain Intervention(s): Repositioned;Ambulated/increased activity  Balance: Balance Balance Assessed: Yes Standardized Balance Assessment Standardized Balance Assessment: Berg Balance Test Berg Balance Test Sit to Stand: Able to stand without using hands and stabilize independently Standing  Unsupported: Able to stand safely 2 minutes Sitting with Back Unsupported but Feet Supported on Floor or Stool: Able to sit safely and securely 2 minutes Stand to Sit: Sits safely with minimal use of hands Transfers: Able to transfer safely, definite need of hands Standing Unsupported with Eyes Closed: Able to stand 10 seconds with supervision Standing Ubsupported with Feet Together: Able to place feet together independently and stand 1 minute safely From Standing, Reach Forward with Outstretched Arm: Can reach forward >5 cm safely (2") (limited by back precautions) From Standing Position, Pick up Object from Floor: Unable to try/needs assist to keep balance (unable 2/2 back precautions) From Standing Position, Turn to Look Behind Over each Shoulder: Needs assist to keep from losing balance and falling (unable 2/2 back precautions) Turn 360 Degrees: Able to turn 360 degrees safely in 4 seconds or less Standing Unsupported, Alternately Place Feet on Step/Stool: Able to stand independently and complete 8 steps >20 seconds Standing Unsupported, One Foot in Front: Able to take small step independently and hold 30 seconds Standing on One Leg: Tries to lift leg/unable to hold 3 seconds but remains standing independently Total Score: 38   See Function Navigator for Current Functional Status.   Therapy/Group: Individual Therapy  Waunita Schooner 06/30/2015, 5:14 PM

## 2015-06-30 NOTE — Progress Notes (Signed)
Physical Therapy Session Note  Patient Details  Name: Rita Gregory MRN: [id number] Date of Birth: 1945-10-20  Today's Date: 06/30/2015 PT Individual Time: 1300-1400 PT Individual Time Calculation (min): 60 min   Short Term Goals: Week 1:  PT Short Term Goal 1 (Week 1): = LTGs due to anticipated LOS  Skilled Therapeutic Interventions/Progress Updates:   Session focused on completing grad day activities and finalizing d/c planning and education. Pt performed lower body dressing EOB modified independent with adaptive equipment at EOB to prepare for PT session. Functional transfers with RW and mobility in pt room at modified independent level to supervision with cues at times (~10% during entire session) to adherence to back precautions functionally. Pt requires extra time for all tasks. Pt instructed in simulated car transfer, stair negotiation (with rails and with RW for home entry simulation multiple repetitions), gait training on unit and over carpeted surfaces as well as compliant surface at supervision level, and administered Five Time Sit to Stand test. Pt denies concerns in regards to d/c tomorrow and performed toileting needs in bathroom at modified independent level at end of session.   Five times Sit to Stand Test (FTSS) Method: Use a straight back chair with a solid seat that is 16-18" high. Ask participant to sit on the chair with arms folded across their chest.   Instructions: "Stand up and sit down as quickly as possible 5 times, keeping your arms folded across your chest."   Measurement: Stop timing when the participant stands the 5th time.  TIME: _24___ (in seconds)  Times > 13.6 seconds is associated with increased disability and morbidity (Guralnik, 2000) Times > 15 seconds is predictive of recurrent falls in healthy individuals aged 7 and older (Buatois, et al., 2008) Normal performance values in community dwelling individuals aged 58 and older (Bohannon,  2006): o 60-69 years: 11.4 seconds o 70-79 years: 12.6 seconds o 80-89 years: 14.8 seconds  MCID: ? 2.3 seconds for Vestibular Disorders Mariah Milling, 2006)   Therapy Documentation Precautions:  Precautions Precautions: Fall, Back Required Braces or Orthoses: Spinal Brace Spinal Brace: Applied in sitting position, Lumbar corset Spinal Brace Comments: patient recalled 1/3 precautions Restrictions Weight Bearing Restrictions: No   Pain: Denies pain.  See Function Navigator for Current Functional Status.   Therapy/Group: Individual Therapy  Canary Brim Ivory Broad, PT, DPT  06/30/2015, 2:05 PM

## 2015-07-01 MED ORDER — DEXAMETHASONE 2 MG PO TABS: 2.0000 mg | ORAL_TABLET | Freq: Two times a day (BID) | ORAL | Status: DC

## 2015-07-01 MED ORDER — DOCUSATE SODIUM 100 MG PO CAPS: 100.0000 mg | ORAL_CAPSULE | Freq: Two times a day (BID) | ORAL | Status: DC

## 2015-07-01 MED ORDER — OXYCODONE HCL 10 MG PO TABS: 10.0000 mg | ORAL_TABLET | Freq: Four times a day (QID) | ORAL | Status: DC | PRN

## 2015-07-01 MED ORDER — METHOCARBAMOL 500 MG PO TABS: 500.0000 mg | ORAL_TABLET | Freq: Four times a day (QID) | ORAL | Status: DC | PRN

## 2015-07-01 MED ORDER — TRAMADOL HCL 50 MG PO TABS: 50.0000 mg | ORAL_TABLET | Freq: Four times a day (QID) | ORAL | Status: DC | PRN

## 2015-07-01 MED ORDER — FERROUS GLUCONATE 324 (38 FE) MG PO TABS: 324.0000 mg | ORAL_TABLET | Freq: Two times a day (BID) | ORAL | Status: DC

## 2015-07-01 MED ORDER — SENNOSIDES-DOCUSATE SODIUM 8.6-50 MG PO TABS: 1.0000 | ORAL_TABLET | Freq: Two times a day (BID) | ORAL | Status: DC

## 2015-07-01 NOTE — Progress Notes (Signed)
Social Work Patient ID: Rita Gregory, female   DOB: [birth year], 70 y.o.   MRN: [id number]   CSW met with pt to update her on team conference discussion.  Pt feels ready to go and is actually feeling more positive and optimistic than she has in a while after her time rehabilitating.  CSW provided supportive listening and encouragement.  Pt was very pleased with the care she received on CIR and plans to share with the community about the program.  CSW explained that [facility] would be in touch about the delivery of the DME to the home and pt stated she has a walker she has borrowed that she can use until her arrives with the other DME.  Pt has chosen [facility] for her home health and that referral was made for PT/OT/RN.  CSW remains available to assist as needed.

## 2015-07-01 NOTE — Discharge Summary (Signed)
Physician Discharge Summary  Patient ID: Rita Gregory MRN: [id number] DOB/AGE: November 07, 1945 70 y.o.  Admit date: 06/22/2015 Discharge date: 07/01/2015  Discharge Diagnoses:  Principal Problem:   Degenerative lumbar spinal stenosis Active Problems:   Weakness of both lower extremities   Ataxia   Gastroesophageal reflux disease without esophagitis   Constipation due to pain medication   Hypoalbuminemia due to protein-calorie malnutrition [facility])   Discharged Condition: stable   Labs:  Basic Metabolic Panel:  Recent Labs Lab 06/29/15 0626  NA 138  K 3.9  CL 101  CO2 28  GLUCOSE 117*  BUN 22*  CREATININE 0.61  CALCIUM 8.8*    CBC:  Recent Labs Lab 06/26/15 0541 06/30/15 1013  WBC 18.2* 9.6  HGB 9.6* 10.3*  HCT 31.7* 31.8*  MCV 93.0 92.7  PLT 461* 341    CBG:   Brief HPI:   Rita Gregory is a 70 y.o. female with history for HTN, fibromyalgia, chronic pain, kyphoscoliosis with increase in curve to 40 degrees who was admitted with intractible right buttock and back pain.and inability to ambulate on 06/12/15. Marland Kitchen She was admitted for pain management and required sedation for MRI which revealed progression in scoliotic stenosis on right with L4-5 and L5-S1 and L2/3 and L3/4 on left. She elected to undergo two stage procedure--anterolateral decompression with fusion from L1 to L4 followed by posterior decompression of right side L4 and L5 nerve roots with fusion of L4/5 and segmental fixation from T12 to illium with pedicle screws and rods on 06/16/15. Post op had issue with hypotension and lethargy due to narcotics. . As blood pressures stabilized narcotics resumed and have been titrated to help with pain management. Shee was transfused with 2 units PRBC for drop in H/H to 6.2/19.1.  Therapy ongoing and patient continues to be limited by pain, BLE weakness, poor safety awareness and ataxic gait. CIR was recommended for follow up therapy.    Hospital Course: Rita Gregory was admitted to rehab 06/22/2015 for inpatient therapies to consist of PT and OT at least three hours five days a week. Past admission physiatrist, therapy team and rehab RN have worked together to provide customized collaborative inpatient rehab. Blood pressures have been stable and energy levels have improved.  She was premedicated prior to therapy sessions to help with tolerance of therapy. Methylphenidate was resumed to help with fibromyalgia symptoms. Bowel program was adjusted to manage narcotic induced constipation. Back incisions have been monitored and are healing well without signs or symptoms of infection.  Foley was removed am after admission and toileting program was set up.  She has been voiding without difficulty but urine culture showed evidence of E coli UTI. She was started on Keflex and has completed 7 day course of antibiotic regimen. CBC showed improvement in H/H and reactive leucocytosis has resolved with treatment of UTI. She is to continue on iron supplement for ABLA.  Anxiety has improved and mood has been stable. She has made good progress and is independent at discharge.    Rehab course: During patient's stay in rehab weekly team conferences were held to monitor patient's progress, set goals and discuss barriers to discharge. At admission, patient required moderate assistance with ADL tasks and min assist with mobility. She has had improvement in activity tolerance, balance, postural control, as well as ability to compensate for deficits.  She is able to don LSO independently.  She is able to complete ADL tasks independently. She is independent for transfers and  able to ambulate household distances independently. She will continue to receive follow up [place], [place] [facility] [place] by [place] after discharge.     Disposition: 01-Home or Self Care  Diet: Regular  Special Instructions: 1. Need to wear back brace when at edge of bed or out of bed. 2. Attempt weaning of  pain medications in the next 1-2 weeks 3. No lifting, driving, or strenuous exercise till cleared by MD. NO bending, twisting or arching 4. Contact Dr. Ellene Route for input on decadron taper.       Discharge Instructions    Ambulatory referral to Physical Medicine Rehab    Complete by:  As directed   Follow up after back surgery            Medication List    STOP taking these medications        HYDROcodone-acetaminophen 5-325 MG tablet  Commonly known as:  NORCO/VICODIN     lisinopril 2.5 MG tablet  Commonly known as:  PRINIVIL,ZESTRIL     testosterone 50 MG/5GM (1%) Gel  Commonly known as:  ANDROGEL      TAKE these medications        acetaminophen 650 MG CR tablet  Commonly known as:  TYLENOL  Take 650 mg by mouth every 8 (eight) hours as needed for pain.     albuterol 108 (90 Base) MCG/ACT inhaler  Commonly known as:  PROVENTIL HFA;VENTOLIN HFA  Inhale 2 puffs into the lungs every 6 (six) hours as needed. For shortness of breath     cycloSPORINE 0.05 % ophthalmic emulsion  Commonly known as:  RESTASIS  Place 1 drop into both eyes 2 (two) times daily.     DEPLIN 15 15-90.314 MG Caps  Take 1 capsule by mouth daily.     dexamethasone 2 MG tablet  Commonly known as:  DECADRON  Take 1 tablet (2 mg total) by mouth every 12 (twelve) hours.     docusate sodium 100 MG capsule  Commonly known as:  COLACE  Take 1 capsule (100 mg total) by mouth 2 (two) times daily.     EPIPEN 2-PAK 0.3 mg/0.3 mL Devi  Generic drug:  EPINEPHrine  Inject 0.3 mg into the muscle once. To medications, touch, or inhaltions     escitalopram 10 MG tablet  Commonly known as:  LEXAPRO  Take 10 mg by mouth 2 (two) times daily.     estrogens (conjugated) 0.3 MG tablet  Commonly known as:  PREMARIN  Take 0.3 mg by mouth daily.     ferrous gluconate 324 MG tablet  Commonly known as:  FERGON  Take 1 tablet (324 mg total) by mouth 2 (two) times daily with a meal. For anemia     gabapentin 100  MG capsule  Commonly known as:  NEURONTIN  Take 1 capsule (100 mg total) by mouth 2 (two) times daily.     ketoconazole 2 % cream  Commonly known as:  NIZORAL  Apply 1 application topically 2 (two) times daily as needed for irritation.     lansoprazole 30 MG capsule  Commonly known as:  PREVACID  Take 30 mg by mouth 2 (two) times daily before a meal.     levothyroxine 25 MCG tablet  Commonly known as:  SYNTHROID, LEVOTHROID  Take 25 mcg by mouth at bedtime.     methocarbamol 500 MG tablet  Commonly known as:  ROBAXIN  Take 1 tablet (500 mg total) by mouth every 6 (six) hours as  needed for muscle spasms.  Notes to Patient:  Use at in morning and afternoon--may use two additional times as needed.       methylphenidate 15 mg/9hr  Commonly known as:  Marianna 1 patch onto the skin daily. wear patch for 9 hours only each day     methylphenidate 10 mg/9hr patch  Commonly known as:  Burney 1 patch onto the skin daily as needed (15mg  daily/10 mg as a prn). Take if extra if needed for ADHD. Wear patch for 9 hours only each day     Oxycodone HCl 10 MG Tabs--RX # 60 pills   Take 1 tablet (10 mg total) by mouth every 6 (six) hours as needed for severe pain.     pravastatin 40 MG tablet  Commonly known as:  PRAVACHOL  Take 40 mg by mouth at bedtime.     pyridOXINE 100 MG tablet  Commonly known as:  VITAMIN B-6  Take 100 mg by mouth daily.     ranitidine 150 MG capsule  Commonly known as:  ZANTAC  Take 150 mg by mouth 2 (two) times daily as needed. For GERD     senna-docusate 8.6-50 MG tablet  Commonly known as:  Senokot-S  Take 1 tablet by mouth 2 (two) times daily.     traMADol 50 MG tablet--Rx # 80 pills   Commonly known as:  ULTRAM  Take 1 tablet (50 mg total) by mouth every 6 (six) hours as needed. For moderate pain  Notes to Patient:  Use one pill with breakfast and lunch to help with pain during the day--may twice a day additionally as needed      traZODone 50 MG tablet  Commonly known as:  DESYREL  Take 50 mg by mouth at bedtime.     TRILIPIX 135 MG capsule  Generic drug:  Choline Fenofibrate  Take 135 mg by mouth at bedtime.       Follow-up Information    Follow up with Limmie Patricia, MD On 07/10/2015.   Specialty:  Endocrinology   Why:  2:00 PM   Contact information:   [place] [address] [phone]       Follow up with Earleen Newport, MD. Call today.   Specialty:  Neurosurgery   Why:  for follow up appointment   Contact information:   [address] [place] [address] [place] [address] [phone]       Follow up with Meredith Staggers, MD.   Specialty:  Physical Medicine and Rehabilitation   Why:  office will call you with follow up appointment   Contact information:   [address], [place]  [place] [address] [phone]       Signed: Bary Leriche 07/01/2015, 6:17 PM

## 2015-07-01 NOTE — Discharge Instructions (Signed)
Inpatient Rehab Discharge Instructions  Rita Gregory Discharge date and time: 07/01/15   Activities/Precautions/ Functional Status: Activity: no lifting, driving, or strenuous exercise till cleared by MD. NO bending, twisting or arching.  Diet: regular diet Wound Care: keep wound clean and dry   Functional status:  ___ No restrictions     ___ Walk up steps independently ___ 24/7 supervision/assistance   ___ Walk up steps with assistance _X__ Intermittent supervision/assistance  ___ Bathe/dress independently _X__ Walk with walker    _X__ Bathe/dress with supervision.  ___ Walk Independently    ___ Shower independently ___ Walk with assistance    ___ Shower with assistance _X__ No alcohol     ___ Return to work/school ________  COMMUNITY REFERRALS UPON DISCHARGE:   Home Health:   PT     OT     RN  Agency:  [facility] Phone:  [phone] Medical Equipment/Items Ordered:  Youth rolling walker; hospital bed; tub seat; 18"x16" lightweight wheelchair with basic cushion  Agency/Supplier:  [facility]        Phone:  [phone]  Special Instructions: 1. Need to wear back brace when at edge of bed or out of bed. 2. Attempt weaning of pain medications in the next 1-2 weeks   My questions have been answered and I understand these instructions. I will adhere to these goals and the provided educational materials after my discharge from the hospital.  Patient/Caregiver Signature _______________________________ Date __________  Clinician Signature _______________________________________ Date __________  Please bring this form and your medication list with you to all your follow-up doctor's appointments.

## 2015-07-01 NOTE — Progress Notes (Addendum)
PHYSICAL MEDICINE & REHABILITATION     PROGRESS NOTE  Subjective/Complaints:  Was a little more uncomfortable last night but otherwise doing quite well..    ROS: Denies CP, SOB, N/V/D.   Objective: Vital Signs: Blood pressure 99/56, pulse 75, temperature 98.3 F (36.8 C), temperature source Oral, resp. rate 18, height 4\' 11"  (1.499 m), weight 61.4 kg (135 lb 5.8 oz), SpO2 95 %. No results found.  Recent Labs  06/30/15 1013  WBC 9.6  HGB 10.3*  HCT 31.8*  PLT 341    Recent Labs  06/29/15 0626  NA 138  K 3.9  CL 101  GLUCOSE 117*  BUN 22*  CREATININE 0.61  CALCIUM 8.8*   CBG (last 3)  No results for input(s): GLUCAP in the last 72 hours.  Wt Readings from Last 3 Encounters:  06/29/15 61.4 kg (135 lb 5.8 oz)  06/16/15 72.4 kg (159 lb 9.8 oz)  02/13/13 72.122 kg (159 lb)    Physical Exam:  BP 99/56 mmHg  Pulse 75  Temp(Src) 98.3 F (36.8 C) (Oral)  Resp 18  Ht 4\' 11"  (1.499 m)  Wt 61.4 kg (135 lb 5.8 oz)  BMI 27.33 kg/m2  SpO2 95% Constitutional: She appears well-developed and well-nourished.  HENT: Normocephalic and atraumatic.  Eyes: Conjunctivae and EOM are normal. No scleral icterus.  Cardiovascular: Normal rate and regular rhythm. No murmur heard. Respiratory: Effort normal and breath sounds normal. No stridor. No respiratory distress. She exhibits no tenderness.  GI: Soft. Bowel sounds are normal. She exhibits no distension. There is no tenderness.  Musculoskeletal: She exhibits no edema or tenderness.  Right lower extremity (shorter) leg length discrepancy.  Bilateral pes planus. Neurological: She is alert and oriented.  Motor: Bilateral upper extremities: 4+/5 proximal distal RLE hip flexion 4+/5, knee extension 4+/5, ankle dorsi/plantar flexion 4-/5 LLE: hip flexion 4-/5, knee extension 4/5, ankle dorsi/plantar flexion 4+/5 Sensation intact to light touch Skin: Skin is warm and dry. Back incision clean and dry Psychiatric:  She has a normal mood and affect. Her behavior is normal. Thought content normal.    Assessment/Plan: 1. Functional deficits secondary to to to Progressive Kyphoscoliosis with lumbar radiculopathy s/p anterolateral decompression with fusion from L1 to L4 and posterior decompression of right side L4 and L5 nerve roots with fusion of L4/5 and segmental fixation from T12 to illium which require 3+ hours per day of interdisciplinary therapy in a comprehensive inpatient rehab setting. Physiatrist is providing close team supervision and 24 hour management of active medical problems listed below. Physiatrist and rehab team continue to assess barriers to discharge/monitor patient progress toward functional and medical goals.  Function:  Bathing Bathing position   Position: Shower  Bathing parts Body parts bathed by patient: Right arm, Left arm, Chest, Abdomen, Front perineal area, Buttocks, Right upper leg, Left upper leg, Right lower leg, Left lower leg, Back Body parts bathed by helper: Right lower leg, Left lower leg, Back  Bathing assist Assist Level: Supervision or verbal cues      Upper Body Dressing/Undressing Upper body dressing   What is the patient wearing?: Pull over shirt/dress, Orthosis, Bra Bra - Perfomed by patient: Thread/unthread right bra strap, Thread/unthread left bra strap, Hook/unhook bra (pull down sports bra) Bra - Perfomed by helper: Hook/unhook bra (pull down sports bra) Pull over shirt/dress - Perfomed by patient: Thread/unthread right sleeve, Thread/unthread left sleeve, Put head through opening, Pull shirt over trunk       Orthosis activity  level: Performed by patient  Upper body assist Assist Level: Set up   Set up : To obtain clothing/put away  Lower Body Dressing/Undressing Lower body dressing   What is the patient wearing?: Underwear, Pants, Socks, Shoes Underwear - Performed by patient: Thread/unthread right underwear leg, Thread/unthread left underwear  leg, Pull underwear up/down Underwear - Performed by helper: Thread/unthread right underwear leg, Thread/unthread left underwear leg Pants- Performed by patient: Thread/unthread right pants leg, Thread/unthread left pants leg, Pull pants up/down Pants- Performed by helper: Thread/unthread right pants leg, Thread/unthread left pants leg Non-skid slipper socks- Performed by patient: Don/doff right sock, Don/doff left sock Non-skid slipper socks- Performed by helper: Don/doff right sock, Don/doff left sock Socks - Performed by patient: Don/doff right sock, Don/doff left sock Socks - Performed by helper: Don/doff right sock, Don/doff left sock Shoes - Performed by patient: Don/doff right shoe, Don/doff left shoe Shoes - Performed by helper: Fasten right, Fasten left          Lower body assist Assist for lower body dressing: Set up Assistive Device Comment: Reacher and sock aid, shoe horn    Toileting Toileting   Toileting steps completed by patient: Adjust clothing prior to toileting, Performs perineal hygiene, Adjust clothing after toileting Toileting steps completed by helper: Adjust clothing prior to toileting Toileting Assistive Devices: Grab bar or rail  Toileting assist Assist level: More than reasonable time, Set up/obtain supplies   Transfers Chair/bed transfer   Chair/bed transfer method: Ambulatory, Stand pivot Chair/bed transfer assist level: No Help, no cues, assistive device, takes more than a reasonable amount of time Chair/bed transfer assistive device: Armrests, Walker, Orthosis     Locomotion Ambulation     Max distance: 150  Assist level: No help, No cues, assistive device, takes more than a reasonable amount of time   Wheelchair   Type: Manual   Assist Level: Dependent (Pt equals 0%)  Cognition Comprehension Comprehension assist level: Follows complex conversation/direction with extra time/assistive device  Expression Expression assist level: Expresses  complex ideas: With extra time/assistive device  Social Interaction Social Interaction assist level: Interacts appropriately with others with medication or extra time (anti-anxiety, antidepressant).  Problem Solving Problem solving assist level: Solves basic 90% of the time/requires cueing < 10% of the time  Memory Memory assist level: Recognizes or recalls 75 - 89% of the time/requires cueing 10 - 24% of the time    Medical Problem List and Plan: 1. BLE weakness with Ataxic gait, poor safety awareness and back pain secondary to Progressive Kyphoscoliosis with lumbar radiculopathy s/p anterolateral decompression with fusion from L1 to L4 and posterior decompression of right side L4 and L5 nerve roots with fusion of L4/5 and segmental fixation from T12 to illium.  Cont CIR  -dc home tomorrow. Mod I in room today  -PA to review meds with pt/husband today 2. DVT Prophylaxis/Anticoagulation: Pharmaceutical: Lovenox 3. Chronic pain/Pain Management: On neurontin bid as well as robaxin bid.   -encouraged use of heat and ice as well  -oob and exercise encouraged  -daytrana patch to help with fibro pain (dose per pt) 4. Mood: LCSW to follow for evaluation and support.  5. Neuropsych: This patient is capable of making decisions on her own behalf. 6. Skin/Wound Care: Monitor wound for healing. Encourage po intake. Added nutritional supplements 7. Fluids/Electrolytes/Nutrition: Monitor I/O.   BMP within acceptable range on 3/14 8. Leucocytosis: Patient afebrile  WBC  9.6 yesterday  -E coli UTI sens to keflex   -clinically doing well  9. ABLA with Iron deficiency: Continue iron supplement  Hemoglobin 10.3  -recheck cbc yesterday much improved 10. Fibromyalgia: On lexapro.  11. GERD: On pepcid and protonix with reasonable control  12. Constipation: Continue Senna S.  13. GU: foley DC'd   bladder training.   -rx uti 14. Hypoalbuminemia    LOS (Days) 9 A FACE TO FACE EVALUATION WAS  PERFORMED  Corey Laski T 07/01/2015 9:02 AM

## 2015-07-01 NOTE — Progress Notes (Signed)
Patient and husband given discharge information from [facility], [place]. All questions answered, and understanding verbalized. All belongings packed by husband, and taken with patient. Patient also given home medication Daytrana 15 mg and Daytrana 10 mg, count preformed with Marzetta Board, RN. Home medication also counted with patient, and assistance of Stacy, Therapist, sports. Patient assisted to car with assistance of Sula, Bertha

## 2015-07-01 NOTE — Patient Care Conference (Signed)
Inpatient RehabilitationTeam Conference and Plan of Care Update Date: 06/30/2015   Time: 2:00 PM    Patient Name: Rita Gregory      Medical Record Number: [id number]  Date of Birth: 1946/02/22 Sex: Female         Room/Bed: 4W13C/4W13C-01 Payor Info: Payor: [employer] EMPLOYEE / Plan: [place] [employer] / Product Type: *No Product type* /    Admitting Diagnosis: LUMBAR FUSION  Admit Date/Time:  06/22/2015  3:31 PM Admission Comments: No comment available   Primary Diagnosis:  <principal problem not specified> Principal Problem: <principal problem not specified>  Patient Active Problem List   Diagnosis Date Noted  . Hypoalbuminemia due to protein-calorie malnutrition ([place])   . Degenerative lumbar spinal stenosis 06/22/2015  . Weakness of both lower extremities   . Ataxia   . Gastroesophageal reflux disease without esophagitis   . Constipation due to pain medication   . S/P lumbar fusion   . Lethargy   . Postprocedural hypotension   . Benign essential HTN   . Fibromyalgia   . Chronic pain syndrome   . Post-operative pain   . Respiratory depression   . Acute blood loss anemia   . Leukocytosis   . Hypomagnesemia   . Lumbar spondylolysis   . Intractable back pain 06/12/2015  . Scoliosis of lumbar spine 06/12/2015  . Paroxysmal spells ([place]) 02/13/2013  . GERD (gastroesophageal reflux disease) 02/16/2012  . Diverticulosis 02/16/2012  . Personal history of colonic polyps 02/16/2012  . ARTHRITIS, RIGHT KNEE 03/27/2008  . BUNIONS, BILATERAL 03/27/2008  . FIBROMYALGIA 03/27/2008  . FOOT PAIN, BILATERAL 03/27/2008  . UNEQUAL LEG LENGTH 03/27/2008  . CONGENITAL PES PLANUS 03/27/2008    Expected Discharge Date: Expected Discharge Date: 07/01/15  Team Members Present: Physician leading conference: Dr. Alger Simons Social Worker Present: Alfonse Alpers, LCSW Nurse Present: Dorien Chihuahua, RN PT Present: Canary Brim, PT OT Present: Willeen Cass, OT SLP Present: Weston Anna, SLP PPS Coordinator present : Daiva Nakayama, RN, CRRN     Current Status/Progress Goal Weekly Team Focus  Medical   improved stength. pain getting better. uti rx'ed. white count checked today and improved. hgb rigsing  wound/id stabilization, prepare medically for dc  pain control, ID follow up   Bowel/Bladder   continent bowel and bladder.   remain continent of bowel and bladder      Swallow/Nutrition/ Hydration             ADL's   LTGs met, pt ready for discharge  mod I toileting, supervision bathroom transfers and set up bathing and dressing  pt ready for discharge home with home health   Mobility   supervision overall  supervision overall  finalize d/c planning, education, d/c planned for 3/22   Communication             Safety/Cognition/ Behavioral Observations            Pain   pain 7/10. on oxyir 53m q4, tramadol 569mq6 prn  pain <7  monitor for effectiveness of pain medicine   Skin   dermabond to lumbar incision OTA.   no new breakdown  monitor skin q shift    Rehab Goals Patient on target to meet rehab goals: Yes Rehab Goals Revised: none *See Care Plan and progress notes for long and short-term goals.  Barriers to Discharge: see prior, confidence, safety    Possible Resolutions to Barriers:  see prior,extensive family education/prep    Discharge Planning/Teaching Needs:  Pt plans  to return to her home with her husband, friends, and paid caregivers to be with her 24/7 for supervision.  Pt's husband went through therapies with pt on friday, 06-26-15 after our family conference.  He will help her transistion to being home and then hire caregivers in to help.   Team Discussion:  Pt is doing well overall - medically and from a therapy standpoint - and is on target for d/c 07-01-15 as targeted.  She has met goals and husband has received family education.  CSW arranged home health and ordered DME.  Revisions to Treatment Plan:  none   Continued Need for Acute  Rehabilitation Level of Care: The patient requires daily medical management by a physician with specialized training in physical medicine and rehabilitation for the following conditions: Daily direction of a multidisciplinary physical rehabilitation program to ensure safe treatment while eliciting the highest outcome that is of practical value to the patient.: Yes Daily medical management of patient stability for increased activity during participation in an intensive rehabilitation regime.: Yes Daily analysis of laboratory values and/or radiology reports with any subsequent need for medication adjustment of medical intervention for : Neurological problems;Urological problems;Post surgical problems  Ryanne Morand, Silvestre Mesi 07/01/2015, 1:25 PM

## 2015-07-01 NOTE — Progress Notes (Signed)
Social Work Discharge Note  The overall goal for the admission was met for:   Discharge location: Yes - home with husband  Length of Stay: Yes - 9 days  Discharge activity level: Yes - supervision  Home/community participation: Yes  Services provided included: MD, RD, PT, OT, RN, Pharmacy, Neuropsych and SW  Financial Services: Medicare, Private Insurance: UMR and Other: Tricare  Follow-up services arranged: Home Health: RN/PT/OT, DME: 18"x16" lightweight w/c with basic cushion; shower seat; youth rolling walker; hospital bed and Patient/Family request agency HH: [facility], DME: [facility]  Comments (or additional information): Pt to d/c home initially with her husband for 24/7 care and then they plan to hire private duty caregivers to be with pt when husband is at work.  Pt was given private duty list by this CSW and discussed the nature of their services and the approximate cost and explained that pt/husband would need to arrange this as it is a private pay situation which requires them to discuss this directly with the agency.    Patient/Family verbalized understanding of follow-up arrangements: Yes  Individual responsible for coordination of the follow-up plan: pt with her husband  Confirmed correct DME delivered: Trey Sailors 07/01/2015    Aryaman [place], Silvestre Mesi

## 2015-07-02 ENCOUNTER — Telehealth: Payer: Self-pay

## 2015-07-02 DIAGNOSIS — IMO0001 Reserved for inherently not codable concepts without codable children: Secondary | ICD-10-CM

## 2015-07-02 DIAGNOSIS — Z4789 Encounter for other orthopedic aftercare: Secondary | ICD-10-CM | POA: Diagnosis not present

## 2015-07-02 DIAGNOSIS — M797 Fibromyalgia: Secondary | ICD-10-CM | POA: Diagnosis not present

## 2015-07-02 DIAGNOSIS — R6889 Other general symptoms and signs: Principal | ICD-10-CM

## 2015-07-02 DIAGNOSIS — H539 Unspecified visual disturbance: Secondary | ICD-10-CM

## 2015-07-02 DIAGNOSIS — M419 Scoliosis, unspecified: Secondary | ICD-10-CM | POA: Diagnosis not present

## 2015-07-02 NOTE — Telephone Encounter (Signed)
These were listed and confirmed by patient as home medications. She only uses albuterol when she travels. You can refill the gabapentin

## 2015-07-02 NOTE — Telephone Encounter (Signed)
1. Are you/is patient experiencing any problems since coming home? Are there any questions regarding any aspect of care? No 2. Are there any questions regarding medications administration/dosing? Are meds being taken as prescribed? Patient should review meds with caller to confirm. Meds have been reviewed. Pt did not receive Gabapentin or Albuterol inhaler. Will follow up on this.  3. Have there been any falls? No 4. Has Home Health been to the house and/or have they contacted you? If not, have you tried to contact them? Can we help you contact them? [facility] PT and OT.  5. Are bowels and bladder emptying properly? Are there any unexpected incontinence issues? If applicable, is patient following bowel/bladder programs? Senna is working for constipation. No more UTI sx's.  6. Any fevers, problems with breathing, unexpected pain? Using husbands inhaler in shortness of breath. 7. Are there any skin problems or new areas of breakdown? Rash-started in hospital  8. Has the patient/family member arranged specialty MD follow up (ie cardiology/neurology/renal/surgical/etc)?  Can we help arrange? Endo appointment scheduled. 9. Does the patient need any other services or support that we can help arrange? No 10. Are caregivers following through as expected in assisting the patient?  Yes Has the patient quit smoking, drinking alcohol, or using drugs as recommended? Pt does not drink, smoke or use drugs.   Pt has a scheduled appointment on 07/10/15 @ 1140 with Ankit Patel. Pt is aware.

## 2015-07-02 NOTE — Progress Notes (Signed)
Social Work Patient ID: Rita Gregory, female   DOB: [birth year], 70 y.o.   MRN: [id number]   Rita Child, LCSW Social Worker Signed  Patient Care Conference 07/01/2015 10:35 AM    Expand All Collapse All   Inpatient RehabilitationTeam Conference and Plan of Care Update Date: 06/30/2015   Time: 2:00 PM     Patient Name: Rita Gregory       Medical Record Number: [id number]  Date of Birth: [birth year] Sex: Female         Room/Bed: 4W13C/4W13C-01 Payor Info: Payor: [employer] EMPLOYEE / Plan: [place] [employer] / Product Type: *No Product type* /    Admitting Diagnosis: LUMBAR FUSION   Admit Date/Time:  06/22/2015  3:31 PM Admission Comments: No comment available   Primary Diagnosis:  <principal problem not specified> Principal Problem: <principal problem not specified>    Patient Active Problem List     Diagnosis  Date Noted   .  Hypoalbuminemia due to protein-calorie malnutrition ([place])     .  Degenerative lumbar spinal stenosis  06/22/2015   .  Weakness of both lower extremities     .  Ataxia     .  Gastroesophageal reflux disease without esophagitis     .  Constipation due to pain medication     .  S/P lumbar fusion     .  Lethargy     .  Postprocedural hypotension     .  Benign essential HTN     .  Fibromyalgia     .  Chronic pain syndrome     .  Post-operative pain     .  Respiratory depression     .  Acute blood loss anemia     .  Leukocytosis     .  Hypomagnesemia     .  Lumbar spondylolysis     .  Intractable back pain  06/12/2015   .  Scoliosis of lumbar spine  06/12/2015   .  Paroxysmal spells ([place])  02/13/2013   .  GERD (gastroesophageal reflux disease)  02/16/2012   .  Diverticulosis  02/16/2012   .  Personal history of colonic polyps  02/16/2012   .  ARTHRITIS, RIGHT KNEE  03/27/2008   .  BUNIONS, BILATERAL  03/27/2008   .  FIBROMYALGIA  03/27/2008   .  FOOT PAIN, BILATERAL  03/27/2008   .  UNEQUAL LEG LENGTH  03/27/2008   .  CONGENITAL PES PLANUS   03/27/2008     Expected Discharge Date: Expected Discharge Date: 07/01/15  Team Members Present: Physician leading conference: Dr. Alger Gregory Social Worker Present: Rita Alpers, LCSW Nurse Present: Rita Chihuahua, RN PT Present: Rita Gregory, PT OT Present: Rita Gregory, OT SLP Present: Rita Gregory, SLP PPS Coordinator present : Rita Nakayama, RN, CRRN        Current Status/Progress  Goal  Weekly Team Focus   Medical     improved stength. pain getting better. uti rx'ed. white count checked today and improved. hgb rigsing  wound/id stabilization, prepare medically for dc  pain control, ID follow up   Bowel/Bladder     continent bowel and bladder.   remain continent of bowel and bladder       Swallow/Nutrition/ Hydration               ADL's     LTGs met, pt ready for discharge  mod I toileting, supervision bathroom transfers and set up bathing and dressing  pt ready for discharge home with home health    Mobility     supervision overall  supervision overall  finalize d/c planning, education, d/c planned for 3/22   Communication               Safety/Cognition/ Behavioral Observations              Pain     pain 7/10. on oxyir 70m q4, tramadol 531mq6 prn  pain <7  monitor for effectiveness of pain medicine    Skin     dermabond to lumbar incision OTA.    no new breakdown  monitor skin q shift    Rehab Goals Patient on target to meet rehab goals: Yes Rehab Goals Revised: none *See Care Plan and progress notes for long and short-term goals.    Barriers to Discharge:  see prior, confidence, safety     Possible Resolutions to Barriers:   see prior,extensive family education/prep     Discharge Planning/Teaching Needs:   Pt plans to return to her home with her husband, friends, and paid caregivers to be with her 24/7 for supervision.  Pt's husband went through therapies with pt on friday, 06-26-15 after our family conference.  He will help her transistion to being  home and then hire caregivers in to help.    Team Discussion:    Pt is doing well overall - medically and from a therapy standpoint - and is on target for d/c 07-01-15 as targeted.  She has met goals and husband has received family education.  CSW arranged home health and ordered DME.   Revisions to Treatment Plan:    none    Continued Need for Acute Rehabilitation Level of Care: The patient requires daily medical management by a physician with specialized training in physical medicine and rehabilitation for the following conditions: Daily direction of a multidisciplinary physical rehabilitation program to ensure safe treatment while eliciting the highest outcome that is of practical value to the patient.: Yes Daily medical management of patient stability for increased activity during participation in an intensive rehabilitation regime.: Yes Daily analysis of laboratory values and/or radiology reports with any subsequent need for medication adjustment of medical intervention for : Neurological problems;Urological problems;Post surgical problems  Rita Gregory, Rita Mesi/22/2017, 1:25 PM

## 2015-07-02 NOTE — Telephone Encounter (Signed)
Spoke with pt to schedule a follow up appointment. Pt states that she did not receive her Gabapentin and Albuterol prescription. According to med list, it does not look like it was sent over to pharmacy. Please advise? Thank you!

## 2015-07-03 DIAGNOSIS — Z4789 Encounter for other orthopedic aftercare: Secondary | ICD-10-CM | POA: Diagnosis not present

## 2015-07-03 DIAGNOSIS — M797 Fibromyalgia: Secondary | ICD-10-CM | POA: Diagnosis not present

## 2015-07-03 DIAGNOSIS — M419 Scoliosis, unspecified: Secondary | ICD-10-CM | POA: Diagnosis not present

## 2015-07-03 MED ORDER — GABAPENTIN 100 MG PO CAPS: 100.0000 mg | ORAL_CAPSULE | Freq: Two times a day (BID) | ORAL | Status: DC

## 2015-07-03 MED FILL — LEXAPRO 10 MG TABLET: 10 | 90 days supply | Qty: 180 | Fill #0

## 2015-07-03 NOTE — Telephone Encounter (Signed)
Gabapentin has been sent to the pharmacy.

## 2015-07-06 DIAGNOSIS — M797 Fibromyalgia: Secondary | ICD-10-CM | POA: Diagnosis not present

## 2015-07-06 DIAGNOSIS — Z4789 Encounter for other orthopedic aftercare: Secondary | ICD-10-CM | POA: Diagnosis not present

## 2015-07-06 DIAGNOSIS — M419 Scoliosis, unspecified: Secondary | ICD-10-CM | POA: Diagnosis not present

## 2015-07-07 DIAGNOSIS — M419 Scoliosis, unspecified: Secondary | ICD-10-CM | POA: Diagnosis not present

## 2015-07-07 DIAGNOSIS — Z4789 Encounter for other orthopedic aftercare: Secondary | ICD-10-CM | POA: Diagnosis not present

## 2015-07-07 DIAGNOSIS — M797 Fibromyalgia: Secondary | ICD-10-CM | POA: Diagnosis not present

## 2015-07-08 DIAGNOSIS — M419 Scoliosis, unspecified: Secondary | ICD-10-CM | POA: Diagnosis not present

## 2015-07-08 DIAGNOSIS — Z4789 Encounter for other orthopedic aftercare: Secondary | ICD-10-CM | POA: Diagnosis not present

## 2015-07-08 DIAGNOSIS — M797 Fibromyalgia: Secondary | ICD-10-CM | POA: Diagnosis not present

## 2015-07-08 DIAGNOSIS — M5441 Lumbago with sciatica, right side: Secondary | ICD-10-CM | POA: Diagnosis not present

## 2015-07-09 DIAGNOSIS — Z4789 Encounter for other orthopedic aftercare: Secondary | ICD-10-CM | POA: Diagnosis not present

## 2015-07-09 DIAGNOSIS — M797 Fibromyalgia: Secondary | ICD-10-CM | POA: Diagnosis not present

## 2015-07-09 DIAGNOSIS — M419 Scoliosis, unspecified: Secondary | ICD-10-CM | POA: Diagnosis not present

## 2015-07-09 NOTE — Telephone Encounter (Signed)
Error

## 2015-07-10 ENCOUNTER — Encounter: Payer: Self-pay | Admitting: Physical Medicine & Rehabilitation

## 2015-07-10 ENCOUNTER — Encounter: Payer: 59 | Attending: Physical Medicine & Rehabilitation | Admitting: Physical Medicine & Rehabilitation

## 2015-07-10 VITALS — BP 109/71 | HR 94

## 2015-07-10 DIAGNOSIS — M797 Fibromyalgia: Secondary | ICD-10-CM | POA: Diagnosis not present

## 2015-07-10 DIAGNOSIS — M419 Scoliosis, unspecified: Secondary | ICD-10-CM | POA: Diagnosis not present

## 2015-07-10 DIAGNOSIS — Z79899 Other long term (current) drug therapy: Secondary | ICD-10-CM

## 2015-07-10 DIAGNOSIS — R200 Anesthesia of skin: Secondary | ICD-10-CM | POA: Diagnosis not present

## 2015-07-10 DIAGNOSIS — J209 Acute bronchitis, unspecified: Secondary | ICD-10-CM

## 2015-07-10 DIAGNOSIS — Z981 Arthrodesis status: Secondary | ICD-10-CM

## 2015-07-10 DIAGNOSIS — K5730 Diverticulosis of large intestine without perforation or abscess without bleeding: Secondary | ICD-10-CM | POA: Insufficient documentation

## 2015-07-10 DIAGNOSIS — K219 Gastro-esophageal reflux disease without esophagitis: Secondary | ICD-10-CM | POA: Diagnosis not present

## 2015-07-10 DIAGNOSIS — I10 Essential (primary) hypertension: Secondary | ICD-10-CM | POA: Diagnosis not present

## 2015-07-10 DIAGNOSIS — Z5181 Encounter for therapeutic drug level monitoring: Secondary | ICD-10-CM | POA: Diagnosis not present

## 2015-07-10 DIAGNOSIS — R5382 Chronic fatigue, unspecified: Secondary | ICD-10-CM | POA: Diagnosis not present

## 2015-07-10 DIAGNOSIS — G894 Chronic pain syndrome: Secondary | ICD-10-CM | POA: Diagnosis not present

## 2015-07-10 DIAGNOSIS — E559 Vitamin D deficiency, unspecified: Secondary | ICD-10-CM | POA: Diagnosis not present

## 2015-07-10 DIAGNOSIS — G43909 Migraine, unspecified, not intractable, without status migrainosus: Secondary | ICD-10-CM | POA: Insufficient documentation

## 2015-07-10 DIAGNOSIS — R21 Rash and other nonspecific skin eruption: Secondary | ICD-10-CM | POA: Insufficient documentation

## 2015-07-10 DIAGNOSIS — R7302 Impaired glucose tolerance (oral): Secondary | ICD-10-CM | POA: Diagnosis not present

## 2015-07-10 DIAGNOSIS — Z7722 Contact with and (suspected) exposure to environmental tobacco smoke (acute) (chronic): Secondary | ICD-10-CM | POA: Diagnosis not present

## 2015-07-10 DIAGNOSIS — Z7289 Other problems related to lifestyle: Secondary | ICD-10-CM | POA: Insufficient documentation

## 2015-07-10 DIAGNOSIS — E782 Mixed hyperlipidemia: Secondary | ICD-10-CM | POA: Diagnosis not present

## 2015-07-10 DIAGNOSIS — M4126 Other idiopathic scoliosis, lumbar region: Secondary | ICD-10-CM | POA: Diagnosis not present

## 2015-07-10 DIAGNOSIS — R438 Other disturbances of smell and taste: Secondary | ICD-10-CM | POA: Diagnosis not present

## 2015-07-10 DIAGNOSIS — G8929 Other chronic pain: Secondary | ICD-10-CM | POA: Insufficient documentation

## 2015-07-10 DIAGNOSIS — R531 Weakness: Secondary | ICD-10-CM | POA: Diagnosis not present

## 2015-07-10 DIAGNOSIS — K635 Polyp of colon: Secondary | ICD-10-CM | POA: Insufficient documentation

## 2015-07-10 DIAGNOSIS — E038 Other specified hypothyroidism: Secondary | ICD-10-CM | POA: Diagnosis not present

## 2015-07-10 NOTE — Addendum Note (Signed)
Addended by: Caro Hight on: 07/10/2015 01:37 PM   Modules accepted: Orders

## 2015-07-10 NOTE — Progress Notes (Addendum)
Subjective:    Patient ID: Rita Gregory, female    DOB: [birth year], 70 y.o.   MRN: [id number]  HPI 70 y.o. female with history for HTN, fibromyalgia, chronic pain, kyphoscoliosis with increase in curve to 40 degrees who was admitted with intractible right buttock and back pain.and inability to ambulate presents for transitional care management after being discharged from the hospital for ataxic gait, poor safety awareness and back pain secondary to Progressive Kyphoscoliosis with lumbar radiculopathy s/p anterolateral decompression with fusion from L1 to L4 and posterior decompression of right side L4 and L5 nerve roots with fusion of L4/5 and segmental fixation from T12 to illium.   Admit date: 06/22/2015 Discharge date: 07/01/2015 Pt states she is doing well.  She saw Neurosurgery 3/29 and he reviewed pt's restrictions.   He recommended continuing brace.   Upon discharge, she encouraged to wear her brace.  She is starting to wean pain medications.  She states her sleep is improving.  Her constipation has also resolved.  Her rash is also improving.  Her pain meds are prescribed by Dr Ellene Route.  DME: Reacher, shoe horn, shower chair Mobility: Walker at all times, and wheelchair for long distances outside house Therapies: PT 2/day, has completed OT. Aid and private aid: 12 hours/day, cutting back as pt progresses.    Pain Inventory Average Pain 6 Pain Right Now 6 My pain is intermittent, constant, burning, stabbing and aching  In the last 24 hours, has pain interfered with the following? General activity 10 Relation with others 3 Enjoyment of life 9 What TIME of day is your pain at its worst? morning and night Sleep (in general) Fair  Pain is worse with: sitting, inactivity and standing Pain improves with: rest and medication Relief from Meds: 7  Mobility walk without assistance Do you have any goals in this area?  yes  Function retired I need assistance with the following:   bathing and household duties  Neuro/Psych weakness numbness loss of taste or smell  Prior Studies Any changes since last visit?  no  Physicians involved in your care Any changes since last visit?  no   Family History  Problem Relation Age of Onset  . Ovarian cancer Mother     passed at age 43  . Heart disease Father   . Kidney disease Father     Kidney cancer  . Stomach cancer Paternal Uncle   . Diabetes type II Father     passed at 9 yrs old  . Colon cancer Father     cancereous polyps  . Bipolar disorder Sister     Osteopenia  . Bipolar disorder Son    Social History   Social History  . Marital Status: Married    Spouse Name: Legrand Como  . Number of Children: N/A  . Years of Education: N/A   Occupational History  . RN  [employer]   Social History Main Topics  . Smoking status: Passive Smoke Exposure - Never Smoker  . Smokeless tobacco: None     Comment: Flight attentent for 7 years. [id number] Passive smoker  . Alcohol Use: 0.5 - 1.5 oz/week    1-3 drink(s) per week     Comment: Social- wine  . Drug Use: No  . Sexual Activity:    Partners: Male   Other Topics Concern  . None   Social History Narrative   Husband is Dr. Tillman Sers    Past Surgical History  Procedure Laterality Date  . Appendectomy    .  Cholecystectomy    . Abdominal hysterectomy    . Joint replacement      RTKA '05  . Knee arthroscopy w/ acl reconstruction      Rt. Knee  . Dry eyes      uses Restasis daily  . Cesarean section      x2  . Right rotator cuff  2011    sx  . Esophagogastroduodenoscopy  02/23/2012    Procedure: ESOPHAGOGASTRODUODENOSCOPY (EGD);  Surgeon: Irene Shipper, MD;  Location: [facility];  Service: Endoscopy;  Laterality: N/A;  . Radiology with anesthesia N/A 06/15/2015    Procedure: RADIOLOGY WITH ANESTHESIA;  Surgeon: Medication Radiologist, MD;  Location: [facility];  Service: Radiology;  Laterality: N/A;  . Anterior lateral lumbar fusion 4 levels Left  06/16/2015    Procedure: ANTERIOR LATERAL LUMBAR FUSION THORACIC TWELVE-LUMBAR FOUR;  Surgeon: Kristeen Miss, MD;  Location: [name];  Service: Neurosurgery;  Laterality: Left;  Thoracolumbar spine  . Posterior lumbar fusion 4 level N/A 06/16/2015    Procedure: POSTERIOR LUMBAR FUSION LUMBAR FOUR-FIVE LUMBAR FIVE-SACRAL ONE;  Surgeon: Kristeen Miss, MD;  Location: [facility];  Service: Neurosurgery;  Laterality: N/A;   Past Medical History  Diagnosis Date  . Diverticulosis of colon (without mention of hemorrhage) 11/09/2007  . Colon polyp 11/09/2007    Hyperplastic   . Esophageal reflux   . Fibromyalgia   . Hypertension   . Kyphoscoliosis     40 degree bend ro right side  . Chronic pain     Dr. Ellene Route follows  . Patent foramen ovale     hx.- Dr. Lemmie Evens. Smith,cardiology- follows  . Migraine   . Paroxysmal spells (HCC) 02/13/2013   BP 109/71 mmHg  Pulse 94  SpO2 97%  Opioid Risk Score:   Fall Risk Score:  `1  Depression screen PHQ 2/9  Depression screen PHQ 2/9 07/10/2015  Decreased Interest 1  Down, Depressed, Hopeless 0  PHQ - 2 Score 1  Altered sleeping 2  Tired, decreased energy 0  Change in appetite 0  Feeling bad or failure about yourself  0  Trouble concentrating 1  Moving slowly or fidgety/restless 0  Suicidal thoughts 0  PHQ-9 Score 4  Difficult doing work/chores Somewhat difficult   Review of Systems  Constitutional: Negative for fever and chills.  Genitourinary: Negative for decreased urine volume.  Musculoskeletal: Positive for back pain.  Skin: Positive for rash.  Neurological: Positive for weakness and numbness. Negative for dizziness.  All other systems reviewed and are negative.     Objective:   Physical Exam Constitutional: She appears well-developed and well-nourished.  NAD.   HENT:  Normocephalic and atraumatic.   Eyes: Conjunctivae and EOM are normal. No scleral icterus.  Cardiovascular: Normal rate and regular rhythm.   No murmur  heard. Respiratory: Effort normal and breath sounds normal. No stridor. No respiratory distress. She exhibits no tenderness.   GI: Soft. Bowel sounds are normal. She exhibits no distension. There is no tenderness.  Musculoskeletal: She exhibits no edema or tenderness.  Right lower extremity (shorter) leg length discrepancy.  Bilateral pes planus. Neurological: She is alert and oriented.  Motor: Bilateral upper extremities: 5/5 proximal distal RLE: Hip flexion 4+/5, knee extension 5/5, ankle dorsi/plantar flexion 4+/5 LLE: hip flexion 4-/5, knee extension 4/5, ankle dorsi/plantar flexion 4+/5 Sensation mildly diminished to light touch RLE. Reflexes 1+ b/l LE Skin: Skin is warm and dry. Back incision clean and dry Psychiatric: She has a normal mood and affect. Her  behavior is normal. Thought content normal.      Assessment & Plan:  70 y.o. female with history for HTN, fibromyalgia, chronic pain, kyphoscoliosis with increase in curve to 40 degrees who was admitted with intractible right buttock and back pain.and inability to ambulate presents for transitional care management s/p lumbar fusion.   1.  Progressive Kyphoscoliosis with lumbar radiculopathy s/p anterolateral decompression with fusion from L1 to L4 and posterior decompression of right side L4 and L5 nerve roots with fusion of L4/5 and segmental fixation from T12 to illium.  Cont therapies  Cont to follow with Neurosurgery  2. Chronic pain/Pain Management  Meds provided by Neurosurgery  3. Fibromyalgia  Cont meds  4. Rash  Improving  Wean benadryl as tolerated  5. Bronchitis  Pt states she rarely uses it now     Meds reviewed: No refills needed DME: No new equipment needed Referrals: Cont to follow with Neurosurgery All questions answered.

## 2015-07-13 DIAGNOSIS — G894 Chronic pain syndrome: Secondary | ICD-10-CM | POA: Diagnosis not present

## 2015-07-13 DIAGNOSIS — Z4789 Encounter for other orthopedic aftercare: Secondary | ICD-10-CM | POA: Diagnosis not present

## 2015-07-13 DIAGNOSIS — I10 Essential (primary) hypertension: Secondary | ICD-10-CM | POA: Diagnosis not present

## 2015-07-13 DIAGNOSIS — M419 Scoliosis, unspecified: Secondary | ICD-10-CM | POA: Diagnosis not present

## 2015-07-13 DIAGNOSIS — M797 Fibromyalgia: Secondary | ICD-10-CM | POA: Diagnosis not present

## 2015-07-14 ENCOUNTER — Inpatient Hospital Stay: Payer: 59 | Admitting: Physical Medicine & Rehabilitation

## 2015-07-14 DIAGNOSIS — M797 Fibromyalgia: Secondary | ICD-10-CM | POA: Diagnosis not present

## 2015-07-14 DIAGNOSIS — Z4789 Encounter for other orthopedic aftercare: Secondary | ICD-10-CM | POA: Diagnosis not present

## 2015-07-14 DIAGNOSIS — M419 Scoliosis, unspecified: Secondary | ICD-10-CM | POA: Diagnosis not present

## 2015-07-16 LAB — TOXASSURE SELECT,+ANTIDEPR,UR: PDF: 0

## 2015-07-17 DIAGNOSIS — M797 Fibromyalgia: Secondary | ICD-10-CM | POA: Diagnosis not present

## 2015-07-17 DIAGNOSIS — M419 Scoliosis, unspecified: Secondary | ICD-10-CM | POA: Diagnosis not present

## 2015-07-17 DIAGNOSIS — Z4789 Encounter for other orthopedic aftercare: Secondary | ICD-10-CM | POA: Diagnosis not present

## 2015-07-20 NOTE — Progress Notes (Signed)
Urine drug screen for this encounter is consistent for prescribed medication 

## 2015-07-21 DIAGNOSIS — M419 Scoliosis, unspecified: Secondary | ICD-10-CM | POA: Diagnosis not present

## 2015-07-21 DIAGNOSIS — Z4789 Encounter for other orthopedic aftercare: Secondary | ICD-10-CM | POA: Diagnosis not present

## 2015-07-21 DIAGNOSIS — M797 Fibromyalgia: Secondary | ICD-10-CM | POA: Diagnosis not present

## 2015-07-28 MED FILL — PREMARIN 0.3 MG TABLET: 0.3 | 90 days supply | Qty: 90 | Fill #1

## 2015-07-28 MED FILL — PRAVASTATIN NA 40 MG TAB: 40 | 90 days supply | Qty: 90 | Fill #0

## 2015-07-28 MED FILL — RESTASIS 0.05% EYE EMULSION: 0.05 | 30 days supply | Qty: 60 | Fill #1

## 2015-07-31 DIAGNOSIS — L820 Inflamed seborrheic keratosis: Secondary | ICD-10-CM | POA: Diagnosis not present

## 2015-07-31 DIAGNOSIS — L812 Freckles: Secondary | ICD-10-CM | POA: Diagnosis not present

## 2015-07-31 DIAGNOSIS — D1801 Hemangioma of skin and subcutaneous tissue: Secondary | ICD-10-CM | POA: Diagnosis not present

## 2015-07-31 DIAGNOSIS — D225 Melanocytic nevi of trunk: Secondary | ICD-10-CM | POA: Diagnosis not present

## 2015-07-31 DIAGNOSIS — L814 Other melanin hyperpigmentation: Secondary | ICD-10-CM | POA: Diagnosis not present

## 2015-07-31 DIAGNOSIS — L821 Other seborrheic keratosis: Secondary | ICD-10-CM | POA: Diagnosis not present

## 2015-07-31 MED FILL — HYDROCORT-IODOQUINOL-ALOE S: 1-1.9 | 30 days supply | Qty: 60 | Fill #0

## 2015-08-01 DIAGNOSIS — M4806 Spinal stenosis, lumbar region: Secondary | ICD-10-CM | POA: Diagnosis not present

## 2015-08-06 DIAGNOSIS — M5441 Lumbago with sciatica, right side: Secondary | ICD-10-CM | POA: Diagnosis not present

## 2015-08-06 DIAGNOSIS — M4120 Other idiopathic scoliosis, site unspecified: Secondary | ICD-10-CM | POA: Diagnosis not present

## 2015-08-06 DIAGNOSIS — Z6832 Body mass index (BMI) 32.0-32.9, adult: Secondary | ICD-10-CM | POA: Diagnosis not present

## 2015-08-07 MED FILL — traMADol HCL 50 MG TABS: 50 | 10 days supply | Qty: 60 | Fill #0

## 2015-08-07 MED FILL — oxyCODONE HCL 10 MG TABS: 10 | 10 days supply | Qty: 60 | Fill #0

## 2015-08-20 DIAGNOSIS — M4120 Other idiopathic scoliosis, site unspecified: Secondary | ICD-10-CM | POA: Diagnosis not present

## 2015-08-24 DIAGNOSIS — M4120 Other idiopathic scoliosis, site unspecified: Secondary | ICD-10-CM | POA: Diagnosis not present

## 2015-08-26 DIAGNOSIS — M4120 Other idiopathic scoliosis, site unspecified: Secondary | ICD-10-CM | POA: Diagnosis not present

## 2015-09-03 DIAGNOSIS — M4120 Other idiopathic scoliosis, site unspecified: Secondary | ICD-10-CM | POA: Diagnosis not present

## 2015-09-08 ENCOUNTER — Ambulatory Visit
Admission: RE | Admit: 2015-09-08 | Discharge: 2015-09-08 | Disposition: A | Discharge status: Home / Self Care | Payer: 59 | Source: Ambulatory Visit | Attending: Obstetrics and Gynecology | Admitting: Obstetrics and Gynecology

## 2015-09-08 DIAGNOSIS — N644 Mastodynia: Secondary | ICD-10-CM

## 2015-09-08 DIAGNOSIS — M4120 Other idiopathic scoliosis, site unspecified: Secondary | ICD-10-CM | POA: Diagnosis not present

## 2015-09-08 DIAGNOSIS — N63 Unspecified lump in breast: Secondary | ICD-10-CM | POA: Diagnosis not present

## 2015-09-09 MED FILL — DEPLIN-ALGAL OIL 15 MG CAP: 15-90.314 | 90 days supply | Qty: 90 | Fill #1

## 2015-09-09 MED FILL — FENOFIBRIC ACID DR 135 MG C: 135 | 90 days supply | Qty: 90 | Fill #1

## 2015-09-10 DIAGNOSIS — M4120 Other idiopathic scoliosis, site unspecified: Secondary | ICD-10-CM | POA: Diagnosis not present

## 2015-09-22 DIAGNOSIS — M4120 Other idiopathic scoliosis, site unspecified: Secondary | ICD-10-CM | POA: Diagnosis not present

## 2015-09-24 DIAGNOSIS — M4120 Other idiopathic scoliosis, site unspecified: Secondary | ICD-10-CM | POA: Diagnosis not present

## 2015-09-30 MED FILL — RESTASIS 0.05% EYE EMULSION: 0.05 | 30 days supply | Qty: 60 | Fill #2

## 2015-09-30 MED FILL — SYNTHROID 25 MCG TABLET: 25 | 90 days supply | Qty: 90 | Fill #1

## 2015-10-01 DIAGNOSIS — M4120 Other idiopathic scoliosis, site unspecified: Secondary | ICD-10-CM | POA: Diagnosis not present

## 2015-10-06 MED FILL — ESCITALOPRAM 10 MG TABLET: 10 | 90 days supply | Qty: 180 | Fill #0

## 2015-10-06 MED FILL — traZODone HCL 50 MG TABS: 50 | 90 days supply | Qty: 135 | Fill #0

## 2015-10-09 ENCOUNTER — Ambulatory Visit: Payer: Medicare Other | Admitting: Physical Medicine & Rehabilitation

## 2015-10-16 DIAGNOSIS — E782 Mixed hyperlipidemia: Secondary | ICD-10-CM | POA: Diagnosis not present

## 2015-10-16 DIAGNOSIS — E559 Vitamin D deficiency, unspecified: Secondary | ICD-10-CM | POA: Diagnosis not present

## 2015-10-16 DIAGNOSIS — R7302 Impaired glucose tolerance (oral): Secondary | ICD-10-CM | POA: Diagnosis not present

## 2015-10-16 DIAGNOSIS — E038 Other specified hypothyroidism: Secondary | ICD-10-CM | POA: Diagnosis not present

## 2015-10-16 DIAGNOSIS — R5382 Chronic fatigue, unspecified: Secondary | ICD-10-CM | POA: Diagnosis not present

## 2015-10-16 DIAGNOSIS — I10 Essential (primary) hypertension: Secondary | ICD-10-CM | POA: Diagnosis not present

## 2015-10-16 DIAGNOSIS — G894 Chronic pain syndrome: Secondary | ICD-10-CM | POA: Diagnosis not present

## 2015-10-19 DIAGNOSIS — F900 Attention-deficit hyperactivity disorder, predominantly inattentive type: Secondary | ICD-10-CM | POA: Diagnosis not present

## 2015-10-19 DIAGNOSIS — G894 Chronic pain syndrome: Secondary | ICD-10-CM | POA: Diagnosis not present

## 2015-10-19 DIAGNOSIS — M419 Scoliosis, unspecified: Secondary | ICD-10-CM | POA: Diagnosis not present

## 2015-10-20 DIAGNOSIS — H52203 Unspecified astigmatism, bilateral: Secondary | ICD-10-CM | POA: Diagnosis not present

## 2015-10-21 DIAGNOSIS — Z6833 Body mass index (BMI) 33.0-33.9, adult: Secondary | ICD-10-CM | POA: Diagnosis not present

## 2015-10-21 DIAGNOSIS — M4120 Other idiopathic scoliosis, site unspecified: Secondary | ICD-10-CM | POA: Diagnosis not present

## 2015-10-21 DIAGNOSIS — M4125 Other idiopathic scoliosis, thoracolumbar region: Secondary | ICD-10-CM | POA: Diagnosis not present

## 2015-10-28 DIAGNOSIS — M4120 Other idiopathic scoliosis, site unspecified: Secondary | ICD-10-CM | POA: Diagnosis not present

## 2015-11-03 DIAGNOSIS — M4120 Other idiopathic scoliosis, site unspecified: Secondary | ICD-10-CM | POA: Diagnosis not present

## 2015-11-04 MED FILL — PREMARIN 0.3 MG TABLET: 0.3 | 90 days supply | Qty: 90 | Fill #2

## 2015-11-05 DIAGNOSIS — M4120 Other idiopathic scoliosis, site unspecified: Secondary | ICD-10-CM | POA: Diagnosis not present

## 2015-11-05 MED FILL — DAYTRANA 10 MG/9 HR PATCH: 10 | 30 days supply | Qty: 30 | Fill #0

## 2015-11-05 MED FILL — DAYTRANA 15 MG/9 HR PATCH: 15 | 30 days supply | Qty: 30 | Fill #0

## 2015-11-15 ENCOUNTER — Encounter: Payer: Self-pay | Admitting: Endocrinology

## 2015-11-23 MED FILL — traMADol HCL 50 MG TABS: 50 | 10 days supply | Qty: 60 | Fill #1

## 2015-11-25 DIAGNOSIS — M4120 Other idiopathic scoliosis, site unspecified: Secondary | ICD-10-CM | POA: Diagnosis not present

## 2015-12-02 DIAGNOSIS — M4120 Other idiopathic scoliosis, site unspecified: Secondary | ICD-10-CM | POA: Diagnosis not present

## 2015-12-08 MED FILL — RESTASIS 0.05% EYE EMULSION: 0.05 | 90 days supply | Qty: 180 | Fill #0

## 2015-12-10 DIAGNOSIS — M4120 Other idiopathic scoliosis, site unspecified: Secondary | ICD-10-CM | POA: Diagnosis not present

## 2015-12-15 MED FILL — FENOFIBRIC ACID DR 135 MG C: 135 | 90 days supply | Qty: 90 | Fill #2

## 2015-12-15 MED FILL — SYNTHROID 25 MCG TABLET: 25 | 90 days supply | Qty: 90 | Fill #2

## 2015-12-15 MED FILL — DEPLIN-ALGAL OIL 15 MG CAP: 15-90.314 | 90 days supply | Qty: 90 | Fill #0

## 2016-01-01 DIAGNOSIS — E038 Other specified hypothyroidism: Secondary | ICD-10-CM | POA: Diagnosis not present

## 2016-01-01 DIAGNOSIS — D509 Iron deficiency anemia, unspecified: Secondary | ICD-10-CM | POA: Diagnosis not present

## 2016-01-01 DIAGNOSIS — E559 Vitamin D deficiency, unspecified: Secondary | ICD-10-CM | POA: Diagnosis not present

## 2016-01-01 DIAGNOSIS — R7302 Impaired glucose tolerance (oral): Secondary | ICD-10-CM | POA: Diagnosis not present

## 2016-01-01 DIAGNOSIS — I10 Essential (primary) hypertension: Secondary | ICD-10-CM | POA: Diagnosis not present

## 2016-01-01 DIAGNOSIS — E782 Mixed hyperlipidemia: Secondary | ICD-10-CM | POA: Diagnosis not present

## 2016-01-07 DIAGNOSIS — M4120 Other idiopathic scoliosis, site unspecified: Secondary | ICD-10-CM | POA: Diagnosis not present

## 2016-01-14 DIAGNOSIS — M4120 Other idiopathic scoliosis, site unspecified: Secondary | ICD-10-CM | POA: Diagnosis not present

## 2016-01-19 DIAGNOSIS — F39 Unspecified mood [affective] disorder: Secondary | ICD-10-CM | POA: Diagnosis not present

## 2016-01-20 DIAGNOSIS — G894 Chronic pain syndrome: Secondary | ICD-10-CM | POA: Diagnosis not present

## 2016-01-20 DIAGNOSIS — F900 Attention-deficit hyperactivity disorder, predominantly inattentive type: Secondary | ICD-10-CM | POA: Diagnosis not present

## 2016-01-20 DIAGNOSIS — M419 Scoliosis, unspecified: Secondary | ICD-10-CM | POA: Diagnosis not present

## 2016-01-20 MED FILL — LEXAPRO 10 MG TABLET: 10 | 90 days supply | Qty: 180 | Fill #0

## 2016-01-21 DIAGNOSIS — M5441 Lumbago with sciatica, right side: Secondary | ICD-10-CM | POA: Diagnosis not present

## 2016-01-21 DIAGNOSIS — M4120 Other idiopathic scoliosis, site unspecified: Secondary | ICD-10-CM | POA: Diagnosis not present

## 2016-01-21 MED FILL — DAYTRANA 15 MG/9 HR PATCH: 15 | 30 days supply | Qty: 30 | Fill #0

## 2016-01-21 MED FILL — DAYTRANA 10 MG/9 HR PATCH: 10 | 30 days supply | Qty: 30 | Fill #0

## 2016-01-22 DIAGNOSIS — E559 Vitamin D deficiency, unspecified: Secondary | ICD-10-CM | POA: Diagnosis not present

## 2016-01-22 DIAGNOSIS — E782 Mixed hyperlipidemia: Secondary | ICD-10-CM | POA: Diagnosis not present

## 2016-01-22 DIAGNOSIS — E038 Other specified hypothyroidism: Secondary | ICD-10-CM | POA: Diagnosis not present

## 2016-01-22 DIAGNOSIS — R5382 Chronic fatigue, unspecified: Secondary | ICD-10-CM | POA: Diagnosis not present

## 2016-01-22 DIAGNOSIS — R7302 Impaired glucose tolerance (oral): Secondary | ICD-10-CM | POA: Diagnosis not present

## 2016-01-22 DIAGNOSIS — G894 Chronic pain syndrome: Secondary | ICD-10-CM | POA: Diagnosis not present

## 2016-01-22 DIAGNOSIS — I10 Essential (primary) hypertension: Secondary | ICD-10-CM | POA: Diagnosis not present

## 2016-01-22 MED FILL — PRAVASTATIN NA 40 MG TAB: 40 | 90 days supply | Qty: 90 | Fill #0

## 2016-01-22 MED FILL — EPINEPHRINE 0.3 MG AUTO-INJ: 0.3 | 30 days supply | Qty: 2 | Fill #0

## 2016-01-22 MED FILL — traZODone HCL 50 MG TABS: 50 | 90 days supply | Qty: 180 | Fill #0

## 2016-01-25 DIAGNOSIS — M25551 Pain in right hip: Secondary | ICD-10-CM | POA: Diagnosis not present

## 2016-01-25 DIAGNOSIS — Z96651 Presence of right artificial knee joint: Secondary | ICD-10-CM | POA: Diagnosis not present

## 2016-01-25 DIAGNOSIS — M7582 Other shoulder lesions, left shoulder: Secondary | ICD-10-CM | POA: Diagnosis not present

## 2016-01-27 DIAGNOSIS — M4120 Other idiopathic scoliosis, site unspecified: Secondary | ICD-10-CM | POA: Diagnosis not present

## 2016-02-01 DIAGNOSIS — M4120 Other idiopathic scoliosis, site unspecified: Secondary | ICD-10-CM | POA: Diagnosis not present

## 2016-02-03 DIAGNOSIS — F39 Unspecified mood [affective] disorder: Secondary | ICD-10-CM | POA: Diagnosis not present

## 2016-02-04 DIAGNOSIS — M4120 Other idiopathic scoliosis, site unspecified: Secondary | ICD-10-CM | POA: Diagnosis not present

## 2016-02-04 MED FILL — PREMARIN 0.3 MG TABLET: 0.3 | 90 days supply | Qty: 90 | Fill #0

## 2016-02-10 DIAGNOSIS — M4120 Other idiopathic scoliosis, site unspecified: Secondary | ICD-10-CM | POA: Diagnosis not present

## 2016-02-17 DIAGNOSIS — M4120 Other idiopathic scoliosis, site unspecified: Secondary | ICD-10-CM | POA: Diagnosis not present

## 2016-02-23 DIAGNOSIS — F39 Unspecified mood [affective] disorder: Secondary | ICD-10-CM | POA: Diagnosis not present

## 2016-03-09 DIAGNOSIS — M4120 Other idiopathic scoliosis, site unspecified: Secondary | ICD-10-CM | POA: Diagnosis not present

## 2016-03-14 MED FILL — SYNTHROID 25 MCG TABLET: 25 | 90 days supply | Qty: 90 | Fill #3

## 2016-03-14 MED FILL — DEPLIN-ALGAL OIL 15 MG CAP: 15-90.314 | 90 days supply | Qty: 90 | Fill #1

## 2016-03-14 MED FILL — FENOFIBRIC ACID DR 135 MG C: 135 | 90 days supply | Qty: 90 | Fill #3

## 2016-03-31 MED FILL — DAYTRANA 10 MG/9 HR PATCH: 10 | 30 days supply | Qty: 30 | Fill #0

## 2016-03-31 MED FILL — DAYTRANA 15 MG/9 HR PATCH: 15 | 30 days supply | Qty: 30 | Fill #0

## 2016-04-07 DIAGNOSIS — M4120 Other idiopathic scoliosis, site unspecified: Secondary | ICD-10-CM | POA: Diagnosis not present

## 2016-04-13 DIAGNOSIS — M4120 Other idiopathic scoliosis, site unspecified: Secondary | ICD-10-CM | POA: Diagnosis not present

## 2016-04-19 DIAGNOSIS — F39 Unspecified mood [affective] disorder: Secondary | ICD-10-CM | POA: Diagnosis not present

## 2016-04-20 DIAGNOSIS — M4120 Other idiopathic scoliosis, site unspecified: Secondary | ICD-10-CM | POA: Diagnosis not present

## 2016-04-29 MED FILL — ESCITALOPRAM 10 MG TABLET: 10 | 30 days supply | Qty: 60 | Fill #0

## 2016-04-29 MED FILL — PRAVASTATIN NA 40 MG TAB: 40 | 90 days supply | Qty: 90 | Fill #1

## 2016-04-29 MED FILL — traZODone HCL 50 MG TABS: 50 | 90 days supply | Qty: 180 | Fill #1

## 2016-05-03 DIAGNOSIS — R6882 Decreased libido: Secondary | ICD-10-CM | POA: Diagnosis not present

## 2016-05-03 DIAGNOSIS — R102 Pelvic and perineal pain: Secondary | ICD-10-CM | POA: Diagnosis not present

## 2016-05-03 DIAGNOSIS — N951 Menopausal and female climacteric states: Secondary | ICD-10-CM | POA: Diagnosis not present

## 2016-05-03 DIAGNOSIS — R350 Frequency of micturition: Secondary | ICD-10-CM | POA: Diagnosis not present

## 2016-05-03 DIAGNOSIS — Z01411 Encounter for gynecological examination (general) (routine) with abnormal findings: Secondary | ICD-10-CM | POA: Diagnosis not present

## 2016-05-04 DIAGNOSIS — G894 Chronic pain syndrome: Secondary | ICD-10-CM | POA: Diagnosis not present

## 2016-05-04 DIAGNOSIS — F900 Attention-deficit hyperactivity disorder, predominantly inattentive type: Secondary | ICD-10-CM | POA: Diagnosis not present

## 2016-05-04 DIAGNOSIS — M419 Scoliosis, unspecified: Secondary | ICD-10-CM | POA: Diagnosis not present

## 2016-05-04 MED FILL — DAYTRANA 15 MG/9 HR PATCH: 15 | 30 days supply | Qty: 30 | Fill #0

## 2016-05-04 MED FILL — DAYTRANA 10 MG/9 HR PATCH: 10 | 30 days supply | Qty: 30 | Fill #0

## 2016-05-04 MED FILL — PREMARIN 0.3 MG TABLET: 0.3 | 90 days supply | Qty: 90 | Fill #0

## 2016-05-05 DIAGNOSIS — M4120 Other idiopathic scoliosis, site unspecified: Secondary | ICD-10-CM | POA: Diagnosis not present

## 2016-05-06 DIAGNOSIS — E559 Vitamin D deficiency, unspecified: Secondary | ICD-10-CM | POA: Insufficient documentation

## 2016-05-06 DIAGNOSIS — N951 Menopausal and female climacteric states: Secondary | ICD-10-CM | POA: Insufficient documentation

## 2016-05-06 DIAGNOSIS — E039 Hypothyroidism, unspecified: Secondary | ICD-10-CM | POA: Insufficient documentation

## 2016-05-06 DIAGNOSIS — M159 Polyosteoarthritis, unspecified: Secondary | ICD-10-CM | POA: Insufficient documentation

## 2016-05-06 DIAGNOSIS — R5382 Chronic fatigue, unspecified: Secondary | ICD-10-CM | POA: Insufficient documentation

## 2016-05-06 DIAGNOSIS — K219 Gastro-esophageal reflux disease without esophagitis: Secondary | ICD-10-CM | POA: Diagnosis not present

## 2016-05-06 DIAGNOSIS — R7302 Impaired glucose tolerance (oral): Secondary | ICD-10-CM | POA: Insufficient documentation

## 2016-05-06 DIAGNOSIS — I10 Essential (primary) hypertension: Secondary | ICD-10-CM | POA: Insufficient documentation

## 2016-05-06 DIAGNOSIS — M797 Fibromyalgia: Secondary | ICD-10-CM | POA: Diagnosis not present

## 2016-05-06 DIAGNOSIS — E782 Mixed hyperlipidemia: Secondary | ICD-10-CM | POA: Insufficient documentation

## 2016-05-18 DIAGNOSIS — F39 Unspecified mood [affective] disorder: Secondary | ICD-10-CM | POA: Diagnosis not present

## 2016-06-03 MED FILL — ESCITALOPRAM 10 MG TABLET: 10 | 30 days supply | Qty: 60 | Fill #1

## 2016-06-06 DIAGNOSIS — F39 Unspecified mood [affective] disorder: Secondary | ICD-10-CM | POA: Diagnosis not present

## 2016-06-09 DIAGNOSIS — M4120 Other idiopathic scoliosis, site unspecified: Secondary | ICD-10-CM | POA: Diagnosis not present

## 2016-06-13 MED FILL — L-METHYLFOLATE FORTE 15 MG: 15-90.314 | 30 days supply | Qty: 30 | Fill #0

## 2016-06-16 DIAGNOSIS — M4120 Other idiopathic scoliosis, site unspecified: Secondary | ICD-10-CM | POA: Diagnosis not present

## 2016-06-17 DIAGNOSIS — L308 Other specified dermatitis: Secondary | ICD-10-CM | POA: Diagnosis not present

## 2016-06-22 DIAGNOSIS — M4120 Other idiopathic scoliosis, site unspecified: Secondary | ICD-10-CM | POA: Diagnosis not present

## 2016-06-24 MED FILL — FENOFIBRIC ACID DR 135 MG C: 135 | 90 days supply | Qty: 90 | Fill #0

## 2016-06-24 MED FILL — SYNTHROID 25 MCG TABLET: 25 | 90 days supply | Qty: 90 | Fill #0

## 2016-06-28 MED FILL — DAYTRANA 15 MG/9 HR PATCH: 15 | 30 days supply | Qty: 30 | Fill #0

## 2016-07-14 DIAGNOSIS — M5441 Lumbago with sciatica, right side: Secondary | ICD-10-CM | POA: Diagnosis not present

## 2016-07-14 DIAGNOSIS — M4120 Other idiopathic scoliosis, site unspecified: Secondary | ICD-10-CM | POA: Diagnosis not present

## 2016-07-27 DIAGNOSIS — F39 Unspecified mood [affective] disorder: Secondary | ICD-10-CM | POA: Diagnosis not present

## 2016-07-28 DIAGNOSIS — M419 Scoliosis, unspecified: Secondary | ICD-10-CM | POA: Diagnosis not present

## 2016-07-28 DIAGNOSIS — G894 Chronic pain syndrome: Secondary | ICD-10-CM | POA: Diagnosis not present

## 2016-07-28 DIAGNOSIS — F900 Attention-deficit hyperactivity disorder, predominantly inattentive type: Secondary | ICD-10-CM | POA: Diagnosis not present

## 2016-07-28 MED FILL — traZODone HCL 50 MG TABS: 50 | 90 days supply | Qty: 180 | Fill #0

## 2016-07-28 MED FILL — L-METHYLFOLATE FORTE 15 MG: 15-90.314 | 30 days supply | Qty: 30 | Fill #0

## 2016-07-28 MED FILL — DAYTRANA 10 MG/9 HR PATCH: 10 | 30 days supply | Qty: 30 | Fill #0

## 2016-07-28 MED FILL — DAYTRANA 15 MG/9 HR PATCH: 15 | 30 days supply | Qty: 30 | Fill #0

## 2016-08-01 MED FILL — PREMARIN 0.3 MG TABLET: 0.3 | 90 days supply | Qty: 90 | Fill #1

## 2016-08-01 MED FILL — PRAVASTATIN NA 40 MG TAB: 40 | 90 days supply | Qty: 90 | Fill #2

## 2016-08-23 MED FILL — RESTASIS 0.05% EYE EMULSION: 0.05 | 90 days supply | Qty: 180 | Fill #1

## 2016-09-01 MED FILL — L-METHYLFOLATE FORTE 15 MG: 15-90.314 | 30 days supply | Qty: 30 | Fill #1

## 2016-09-07 DIAGNOSIS — F39 Unspecified mood [affective] disorder: Secondary | ICD-10-CM | POA: Diagnosis not present

## 2016-09-13 MED FILL — L-METHYLFOLATE-ALGAE 15-90.: 15-90.314 | 30 days supply | Qty: 30 | Fill #1

## 2016-09-13 MED FILL — ESCITALOPRAM 10 MG TABLET: 10 | 30 days supply | Qty: 60 | Fill #2

## 2016-09-13 MED FILL — DAYTRANA 15 MG/9 HR PATCH: 15 | 30 days supply | Qty: 30 | Fill #0

## 2016-09-13 MED FILL — DAYTRANA 10 MG/9 HR PATCH: 10 | 30 days supply | Qty: 30 | Fill #0

## 2016-09-14 DIAGNOSIS — B372 Candidiasis of skin and nail: Secondary | ICD-10-CM | POA: Diagnosis not present

## 2016-10-03 DIAGNOSIS — L821 Other seborrheic keratosis: Secondary | ICD-10-CM | POA: Diagnosis not present

## 2016-10-03 DIAGNOSIS — B372 Candidiasis of skin and nail: Secondary | ICD-10-CM | POA: Diagnosis not present

## 2016-10-04 DIAGNOSIS — E782 Mixed hyperlipidemia: Secondary | ICD-10-CM | POA: Diagnosis not present

## 2016-10-04 DIAGNOSIS — I10 Essential (primary) hypertension: Secondary | ICD-10-CM | POA: Diagnosis not present

## 2016-10-04 DIAGNOSIS — N951 Menopausal and female climacteric states: Secondary | ICD-10-CM | POA: Diagnosis not present

## 2016-10-04 DIAGNOSIS — K219 Gastro-esophageal reflux disease without esophagitis: Secondary | ICD-10-CM | POA: Diagnosis not present

## 2016-10-04 DIAGNOSIS — M79671 Pain in right foot: Secondary | ICD-10-CM | POA: Insufficient documentation

## 2016-10-04 DIAGNOSIS — M797 Fibromyalgia: Secondary | ICD-10-CM | POA: Diagnosis not present

## 2016-10-04 DIAGNOSIS — E039 Hypothyroidism, unspecified: Secondary | ICD-10-CM | POA: Diagnosis not present

## 2016-10-04 DIAGNOSIS — R5382 Chronic fatigue, unspecified: Secondary | ICD-10-CM | POA: Diagnosis not present

## 2016-10-04 DIAGNOSIS — M79672 Pain in left foot: Secondary | ICD-10-CM | POA: Insufficient documentation

## 2016-10-04 DIAGNOSIS — R7302 Impaired glucose tolerance (oral): Secondary | ICD-10-CM | POA: Diagnosis not present

## 2016-10-04 DIAGNOSIS — E559 Vitamin D deficiency, unspecified: Secondary | ICD-10-CM | POA: Diagnosis not present

## 2016-10-10 MED FILL — FENOFIBRIC ACID DR 135 MG C: 135 | 90 days supply | Qty: 90 | Fill #1

## 2016-10-10 MED FILL — DAYTRANA 15 MG/9 HR PATCH: 15 | 30 days supply | Qty: 30 | Fill #0

## 2016-10-10 MED FILL — L-METHYLFOLATE-ALGAE 15-90.: 15-90.314 | 30 days supply | Qty: 30 | Fill #2

## 2016-10-10 MED FILL — SYNTHROID 25 MCG TABLET: 25 | 90 days supply | Qty: 90 | Fill #1

## 2016-11-01 DIAGNOSIS — F900 Attention-deficit hyperactivity disorder, predominantly inattentive type: Secondary | ICD-10-CM | POA: Diagnosis not present

## 2016-11-01 DIAGNOSIS — M419 Scoliosis, unspecified: Secondary | ICD-10-CM | POA: Diagnosis not present

## 2016-11-01 DIAGNOSIS — G894 Chronic pain syndrome: Secondary | ICD-10-CM | POA: Diagnosis not present

## 2016-11-01 MED FILL — L-METHYLFOLATE-ALGAE 15-90.: 15-90.314 | 30 days supply | Qty: 30 | Fill #0

## 2016-11-01 MED FILL — traZODone HCL 50 MG TABS: 50 | 90 days supply | Qty: 180 | Fill #0

## 2016-11-03 DIAGNOSIS — L304 Erythema intertrigo: Secondary | ICD-10-CM | POA: Diagnosis not present

## 2016-11-03 DIAGNOSIS — L920 Granuloma annulare: Secondary | ICD-10-CM | POA: Diagnosis not present

## 2016-11-03 DIAGNOSIS — L814 Other melanin hyperpigmentation: Secondary | ICD-10-CM | POA: Diagnosis not present

## 2016-11-03 DIAGNOSIS — L720 Epidermal cyst: Secondary | ICD-10-CM | POA: Diagnosis not present

## 2016-11-03 DIAGNOSIS — D1801 Hemangioma of skin and subcutaneous tissue: Secondary | ICD-10-CM | POA: Diagnosis not present

## 2016-11-03 DIAGNOSIS — L821 Other seborrheic keratosis: Secondary | ICD-10-CM | POA: Diagnosis not present

## 2016-11-03 MED FILL — PREMARIN 0.3 MG TABLET: 0.3 | 90 days supply | Qty: 90 | Fill #2

## 2016-11-03 MED FILL — PRAVASTATIN NA 40 MG TAB: 40 | 90 days supply | Qty: 90 | Fill #3

## 2016-11-08 ENCOUNTER — Other Ambulatory Visit: Payer: Self-pay | Admitting: Obstetrics and Gynecology

## 2016-11-08 DIAGNOSIS — Z1231 Encounter for screening mammogram for malignant neoplasm of breast: Secondary | ICD-10-CM

## 2016-11-09 DIAGNOSIS — G8929 Other chronic pain: Secondary | ICD-10-CM | POA: Diagnosis not present

## 2016-11-09 DIAGNOSIS — M25512 Pain in left shoulder: Secondary | ICD-10-CM | POA: Diagnosis not present

## 2016-11-09 DIAGNOSIS — M5412 Radiculopathy, cervical region: Secondary | ICD-10-CM | POA: Diagnosis not present

## 2016-11-09 DIAGNOSIS — Z6832 Body mass index (BMI) 32.0-32.9, adult: Secondary | ICD-10-CM | POA: Diagnosis not present

## 2016-11-09 MED FILL — ALPRAZolam 1 MG TABS: 1 | 1 days supply | Qty: 1 | Fill #0

## 2016-11-09 MED FILL — FLUCONAZOLE 200 MG TABLET: 200 | 2 days supply | Qty: 2 | Fill #0

## 2016-11-10 MED FILL — EPIPEN 2-PAK 0.3 MG AUTO-IN: 0.3 | 2 days supply | Qty: 2 | Fill #0

## 2016-11-11 ENCOUNTER — Other Ambulatory Visit: Payer: Self-pay | Admitting: Neurological Surgery

## 2016-11-11 DIAGNOSIS — M5412 Radiculopathy, cervical region: Secondary | ICD-10-CM

## 2016-11-17 MED FILL — DAYTRANA 10 MG/9 HR PATCH: 10 | 30 days supply | Qty: 30 | Fill #0

## 2016-11-18 MED FILL — DAYTRANA 15 MG/9 HR PATCH: 15 | 30 days supply | Qty: 30 | Fill #0

## 2016-11-22 ENCOUNTER — Other Ambulatory Visit (INDEPENDENT_AMBULATORY_CARE_PROVIDER_SITE_OTHER): Payer: Self-pay | Admitting: "Endocrinology

## 2016-11-22 ENCOUNTER — Ambulatory Visit
Admission: RE | Admit: 2016-11-22 | Discharge: 2016-11-22 | Disposition: A | Discharge status: Home / Self Care | Payer: 59 | Source: Ambulatory Visit | Attending: Obstetrics and Gynecology | Admitting: Obstetrics and Gynecology

## 2016-11-22 DIAGNOSIS — R7302 Impaired glucose tolerance (oral): Secondary | ICD-10-CM | POA: Diagnosis not present

## 2016-11-22 DIAGNOSIS — Z1231 Encounter for screening mammogram for malignant neoplasm of breast: Secondary | ICD-10-CM | POA: Diagnosis not present

## 2016-11-22 DIAGNOSIS — E782 Mixed hyperlipidemia: Secondary | ICD-10-CM | POA: Diagnosis not present

## 2016-11-22 DIAGNOSIS — E559 Vitamin D deficiency, unspecified: Secondary | ICD-10-CM | POA: Diagnosis not present

## 2016-11-22 DIAGNOSIS — D509 Iron deficiency anemia, unspecified: Secondary | ICD-10-CM | POA: Diagnosis not present

## 2016-11-22 DIAGNOSIS — E038 Other specified hypothyroidism: Secondary | ICD-10-CM | POA: Diagnosis not present

## 2016-11-22 DIAGNOSIS — R300 Dysuria: Secondary | ICD-10-CM | POA: Diagnosis not present

## 2016-11-22 DIAGNOSIS — I10 Essential (primary) hypertension: Secondary | ICD-10-CM | POA: Diagnosis not present

## 2016-11-22 DIAGNOSIS — M7671 Peroneal tendinitis, right leg: Secondary | ICD-10-CM | POA: Diagnosis not present

## 2016-11-22 LAB — T4, FREE: Free T4: 1.2 ng/dL (ref 0.8–1.8)

## 2016-11-22 LAB — CBC
HCT: 41.5 % (ref 35.0–45.0)
Hemoglobin: 13.5 g/dL (ref 11.7–15.5)
MCH: 28.5 pg (ref 27.0–33.0)
MCHC: 32.5 g/dL (ref 32.0–36.0)
MCV: 87.7 fL (ref 80.0–100.0)
MPV: 9.5 fL (ref 7.5–12.5)
Platelets: 341 10*3/uL (ref 140–400)
RBC: 4.73 MIL/uL (ref 3.80–5.10)
RDW: 14.1 % (ref 11.0–15.0)
WBC: 7.2 10*3/uL (ref 3.8–10.8)

## 2016-11-22 LAB — TSH: TSH: 2.17 m[IU]/L

## 2016-11-23 LAB — URINALYSIS, ROUTINE W REFLEX MICROSCOPIC
Bilirubin Urine: NEGATIVE
Glucose, UA: NEGATIVE
Hgb urine dipstick: NEGATIVE
Ketones, ur: NEGATIVE
Leukocytes, UA: NEGATIVE
Nitrite: NEGATIVE
Protein, ur: NEGATIVE
Specific Gravity, Urine: 1.016 (ref 1.001–1.035)
pH: 6 (ref 5.0–8.0)

## 2016-11-23 LAB — CMP 10231
AG Ratio: 1.6 Ratio (ref 1.0–2.5)
ALT: 12 U/L (ref 6–29)
AST: 21 U/L (ref 10–35)
Albumin: 4.2 g/dL (ref 3.6–5.1)
Alkaline Phosphatase: 57 U/L (ref 33–130)
BUN/Creatinine Ratio: 30.4 Ratio — ABNORMAL HIGH (ref 6–22)
BUN: 21 mg/dL (ref 7–25)
CO2: 22 mmol/L (ref 20–32)
Calcium: 9.5 mg/dL (ref 8.6–10.4)
Chloride: 101 mmol/L (ref 98–110)
Creat: 0.69 mg/dL (ref 0.60–0.93)
GFR, Est African American: >89 mL/min (ref >=60)
GFR, Est Non African American: 87 mL/min (ref >=60)
Globulin: 2.7 g/dL (ref 1.9–3.7)
Glucose, Bld: 96 mg/dL (ref 70–99)
Potassium: 4.6 mmol/L (ref 3.5–5.3)
Sodium: 138 mmol/L (ref 135–146)
Total Bilirubin: 0.3 mg/dL (ref 0.2–1.2)
Total Protein: 6.9 g/dL (ref 6.1–8.1)

## 2016-11-23 LAB — URIC ACID: Uric Acid, Serum: 4.8 mg/dL (ref 2.5–7.0)

## 2016-11-23 LAB — IRON AND TIBC
%SAT: 10 % — ABNORMAL LOW (ref 11–50)
Iron: 48 ug/dL (ref 45–160)
TIBC: 466 ug/dL — ABNORMAL HIGH (ref 250–450)
UIBC: 418 ug/dL

## 2016-11-23 LAB — LIPID PANEL
Cholesterol: 181 mg/dL (ref <200)
HDL: 69 mg/dL (ref >50)
LDL-Cholesterol: 93 mg/dL
Non-HDL Cholesterol (Calc): 112 mg/dL (ref <130)
Total CHOL/HDL Ratio: 2.6 Ratio (ref <5.0)
Triglycerides: 99 mg/dL (ref <150)

## 2016-11-23 LAB — VITAMIN D 25 HYDROXY (VIT D DEFICIENCY, FRACTURES): Vit D, 25-Hydroxy: 69 ng/mL (ref 30–100)

## 2016-11-23 LAB — URINE CULTURE

## 2016-11-23 LAB — FERRITIN: Ferritin: 22 ng/mL (ref 20–288)

## 2016-11-23 LAB — HEMOGLOBIN A1C W/OUT EAG: Hgb A1c MFr Bld: 5.8 % — ABNORMAL HIGH (ref <5.7)

## 2016-11-25 ENCOUNTER — Ambulatory Visit
Admission: RE | Admit: 2016-11-25 | Discharge: 2016-11-25 | Disposition: A | Discharge status: Home / Self Care | Payer: 59 | Source: Ambulatory Visit | Attending: Neurological Surgery | Admitting: Neurological Surgery

## 2016-11-25 DIAGNOSIS — M50223 Other cervical disc displacement at C6-C7 level: Secondary | ICD-10-CM | POA: Diagnosis not present

## 2016-11-25 DIAGNOSIS — M5412 Radiculopathy, cervical region: Secondary | ICD-10-CM

## 2016-11-25 DIAGNOSIS — M50221 Other cervical disc displacement at C4-C5 level: Secondary | ICD-10-CM | POA: Diagnosis not present

## 2016-11-25 DIAGNOSIS — M50222 Other cervical disc displacement at C5-C6 level: Secondary | ICD-10-CM | POA: Diagnosis not present

## 2016-12-07 DIAGNOSIS — M25512 Pain in left shoulder: Secondary | ICD-10-CM | POA: Diagnosis not present

## 2016-12-13 ENCOUNTER — Other Ambulatory Visit: Payer: Self-pay | Admitting: Orthopedic Surgery

## 2016-12-13 DIAGNOSIS — M25512 Pain in left shoulder: Secondary | ICD-10-CM

## 2016-12-13 DIAGNOSIS — S46012A Strain of muscle(s) and tendon(s) of the rotator cuff of left shoulder, initial encounter: Secondary | ICD-10-CM | POA: Diagnosis not present

## 2016-12-19 MED FILL — L-METHYLFOLATE-ALGAE 15-90.: 15-90.314 | 30 days supply | Qty: 30 | Fill #1

## 2016-12-20 MED FILL — DAYTRANA 15 MG/9 HR PATCH: 15 | 30 days supply | Qty: 30 | Fill #0

## 2016-12-21 ENCOUNTER — Other Ambulatory Visit: Payer: Self-pay

## 2017-01-05 ENCOUNTER — Ambulatory Visit
Admission: RE | Admit: 2017-01-05 | Discharge: 2017-01-05 | Disposition: A | Discharge status: Home / Self Care | Payer: 59 | Source: Ambulatory Visit | Attending: Orthopedic Surgery | Admitting: Orthopedic Surgery

## 2017-01-05 DIAGNOSIS — M25512 Pain in left shoulder: Secondary | ICD-10-CM

## 2017-01-05 DIAGNOSIS — M75101 Unspecified rotator cuff tear or rupture of right shoulder, not specified as traumatic: Secondary | ICD-10-CM | POA: Diagnosis not present

## 2017-01-06 DIAGNOSIS — M76822 Posterior tibial tendinitis, left leg: Secondary | ICD-10-CM | POA: Diagnosis not present

## 2017-01-06 DIAGNOSIS — M2012 Hallux valgus (acquired), left foot: Secondary | ICD-10-CM | POA: Diagnosis not present

## 2017-01-06 DIAGNOSIS — M2011 Hallux valgus (acquired), right foot: Secondary | ICD-10-CM | POA: Diagnosis not present

## 2017-01-06 DIAGNOSIS — M76821 Posterior tibial tendinitis, right leg: Secondary | ICD-10-CM | POA: Diagnosis not present

## 2017-01-09 MED FILL — SYNTHROID 25 MCG TABLET: 25 | 90 days supply | Qty: 90 | Fill #2

## 2017-01-13 DIAGNOSIS — E039 Hypothyroidism, unspecified: Secondary | ICD-10-CM | POA: Diagnosis not present

## 2017-01-13 DIAGNOSIS — M797 Fibromyalgia: Secondary | ICD-10-CM | POA: Diagnosis not present

## 2017-01-13 DIAGNOSIS — E782 Mixed hyperlipidemia: Secondary | ICD-10-CM | POA: Diagnosis not present

## 2017-01-13 DIAGNOSIS — R7302 Impaired glucose tolerance (oral): Secondary | ICD-10-CM | POA: Diagnosis not present

## 2017-01-13 DIAGNOSIS — R5382 Chronic fatigue, unspecified: Secondary | ICD-10-CM | POA: Diagnosis not present

## 2017-01-13 DIAGNOSIS — N951 Menopausal and female climacteric states: Secondary | ICD-10-CM | POA: Diagnosis not present

## 2017-01-13 DIAGNOSIS — E559 Vitamin D deficiency, unspecified: Secondary | ICD-10-CM | POA: Diagnosis not present

## 2017-01-13 DIAGNOSIS — K219 Gastro-esophageal reflux disease without esophagitis: Secondary | ICD-10-CM | POA: Diagnosis not present

## 2017-01-13 DIAGNOSIS — I10 Essential (primary) hypertension: Secondary | ICD-10-CM | POA: Diagnosis not present

## 2017-01-17 MED FILL — DAYTRANA 15 MG/9 HR PATCH: 15 | 30 days supply | Qty: 30 | Fill #0

## 2017-01-18 MED FILL — ACCU-CHEK GUIDE TEST STRIP: 90 days supply | Qty: 200 | Fill #0

## 2017-01-19 DIAGNOSIS — M2011 Hallux valgus (acquired), right foot: Secondary | ICD-10-CM | POA: Diagnosis not present

## 2017-01-19 DIAGNOSIS — M2012 Hallux valgus (acquired), left foot: Secondary | ICD-10-CM | POA: Diagnosis not present

## 2017-02-03 MED FILL — FENOFIBRIC ACID DR 135 MG C: 135 | 90 days supply | Qty: 90 | Fill #2

## 2017-02-03 MED FILL — L-METHYLFOLATE-ALGAE 15-90.: 15-90.314 | 30 days supply | Qty: 30 | Fill #2

## 2017-02-03 MED FILL — FLUCONAZOLE 200 MG TABLET: 200 | 2 days supply | Qty: 2 | Fill #1

## 2017-02-03 MED FILL — PREMARIN 0.3 MG TABLET: 0.3 | 90 days supply | Qty: 90 | Fill #3

## 2017-02-03 MED FILL — PRAVASTATIN NA 40 MG TAB: 40 | 90 days supply | Qty: 90 | Fill #0

## 2017-02-10 DIAGNOSIS — M2012 Hallux valgus (acquired), left foot: Secondary | ICD-10-CM | POA: Diagnosis not present

## 2017-02-10 DIAGNOSIS — M2011 Hallux valgus (acquired), right foot: Secondary | ICD-10-CM | POA: Diagnosis not present

## 2017-02-14 DIAGNOSIS — M2011 Hallux valgus (acquired), right foot: Secondary | ICD-10-CM | POA: Diagnosis not present

## 2017-02-14 DIAGNOSIS — M2012 Hallux valgus (acquired), left foot: Secondary | ICD-10-CM | POA: Diagnosis not present

## 2017-02-15 DIAGNOSIS — M419 Scoliosis, unspecified: Secondary | ICD-10-CM | POA: Diagnosis not present

## 2017-02-15 DIAGNOSIS — G894 Chronic pain syndrome: Secondary | ICD-10-CM | POA: Diagnosis not present

## 2017-02-15 DIAGNOSIS — F902 Attention-deficit hyperactivity disorder, combined type: Secondary | ICD-10-CM | POA: Diagnosis not present

## 2017-02-15 MED FILL — traZODone HCL 50 MG TABS: 50 | 90 days supply | Qty: 180 | Fill #0

## 2017-02-15 MED FILL — DAYTRANA 15 MG/9 HR PATCH: 15 | 30 days supply | Qty: 30 | Fill #0

## 2017-02-15 MED FILL — DAYTRANA 10 MG/9 HR PATCH: 10 | 30 days supply | Qty: 30 | Fill #0

## 2017-02-17 DIAGNOSIS — M2012 Hallux valgus (acquired), left foot: Secondary | ICD-10-CM | POA: Diagnosis not present

## 2017-02-17 DIAGNOSIS — M2011 Hallux valgus (acquired), right foot: Secondary | ICD-10-CM | POA: Diagnosis not present

## 2017-02-21 DIAGNOSIS — S46012D Strain of muscle(s) and tendon(s) of the rotator cuff of left shoulder, subsequent encounter: Secondary | ICD-10-CM | POA: Diagnosis not present

## 2017-03-06 MED FILL — L-METHYLFOLATE-ALGAE 15-90.: 15-90.314 | 30 days supply | Qty: 30 | Fill #3

## 2017-03-16 DIAGNOSIS — S46012D Strain of muscle(s) and tendon(s) of the rotator cuff of left shoulder, subsequent encounter: Secondary | ICD-10-CM | POA: Diagnosis not present

## 2017-03-16 MED FILL — MELOXICAM 7.5 MG TABLET: 7.5 | 30 days supply | Qty: 60 | Fill #0

## 2017-03-17 DIAGNOSIS — M2012 Hallux valgus (acquired), left foot: Secondary | ICD-10-CM | POA: Diagnosis not present

## 2017-03-17 DIAGNOSIS — M2011 Hallux valgus (acquired), right foot: Secondary | ICD-10-CM | POA: Diagnosis not present

## 2017-03-18 IMAGING — MR MR LUMBAR SPINE W/O CM
4 of 6 series · 18 of 48 positions shown · non-contrast
Comparison: Lumbar spine radiographs 11/12/2014.

CLINICAL DATA: Severe intractable back pain with right lumbar
radiculopathy. Scoliosis.

EXAM:
MRI LUMBAR SPINE WITHOUT CONTRAST
TECHNIQUE: Multiplanar, multisequence MR imaging of the lumbar spine was
performed. No intravenous contrast was administered.

[Series 2: T2 · sagittal · 4.0mm · 0.55mm/px · 5 of 20 slices shown (1 of 2)]
[im 1/20]
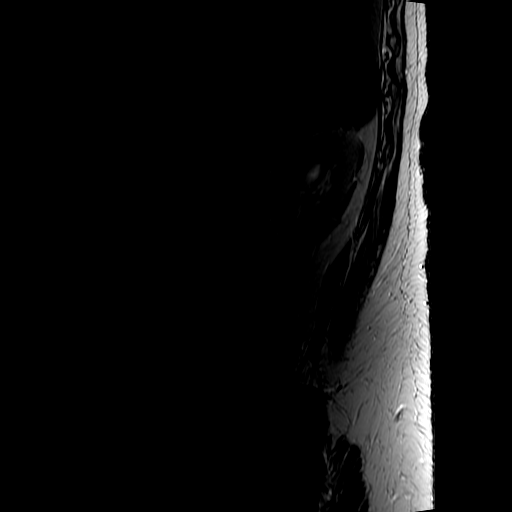
[im 5/20]
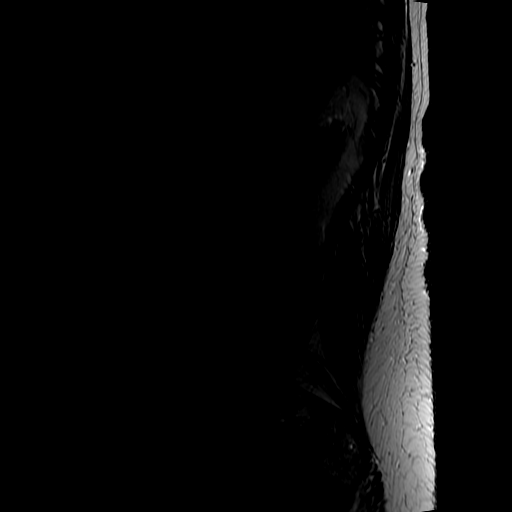
[im 10/20]
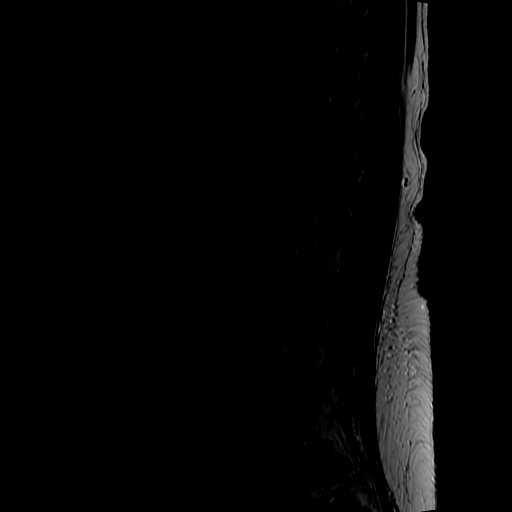
[im 15/20]
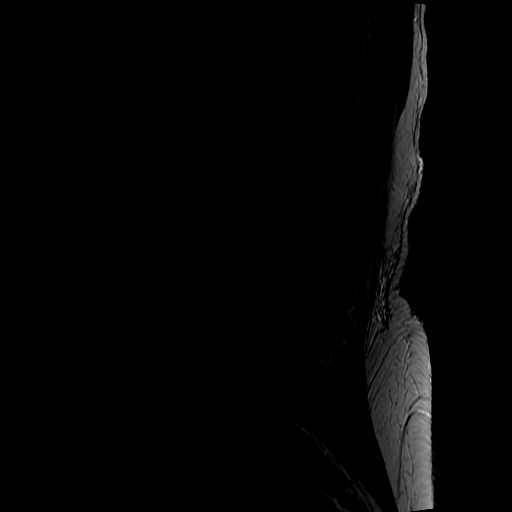
[im 20/20]
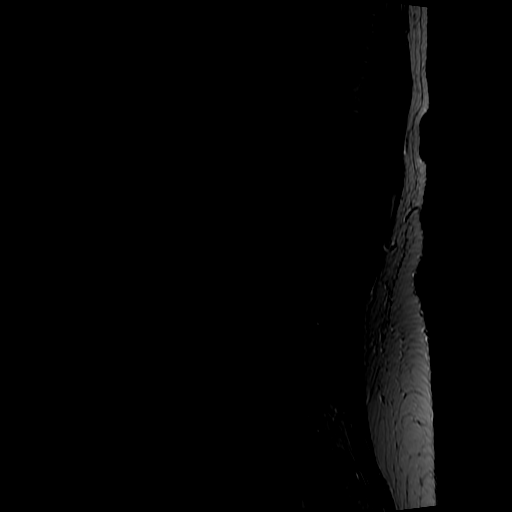

[Series 4: T1 · sagittal · 4.0mm · 0.55mm/px · 3 of 20 slices shown (1 of 2)]
[im 1/20]
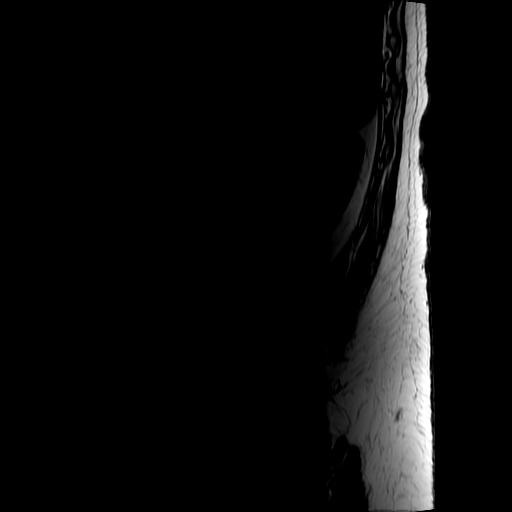
[im 10/20]
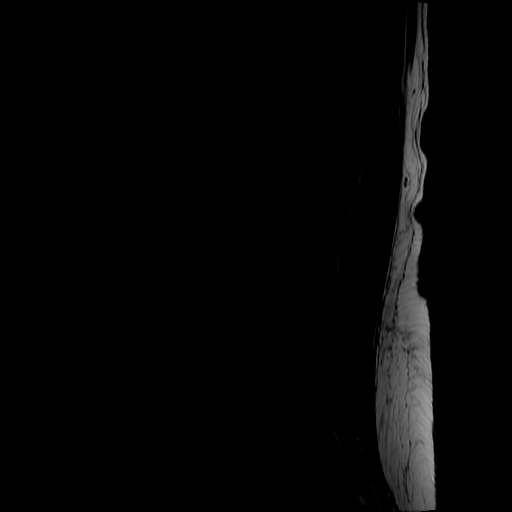
[im 20/20]
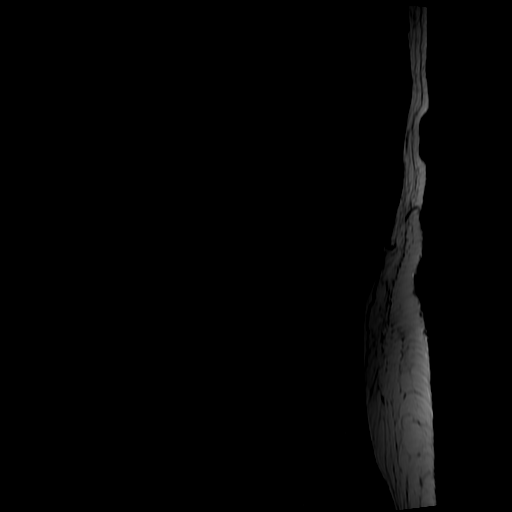

[Series 5: T1 · coronal · 3.0mm · 0.51mm/px · 3 of 34 slices shown (2 of 2)]
[im 5/34]
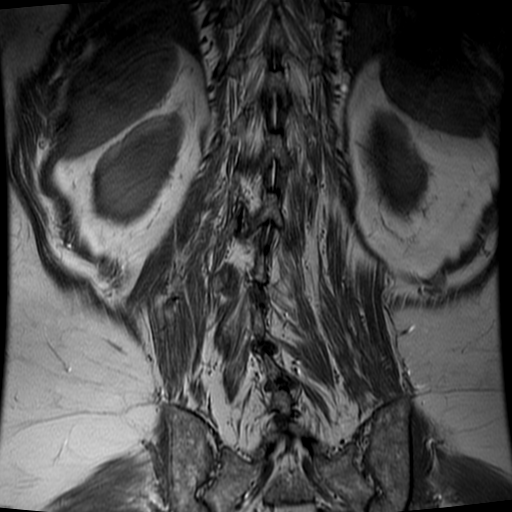
[im 17/34]
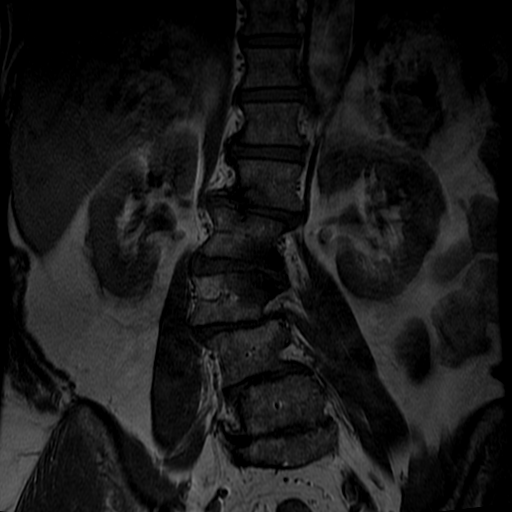
[im 29/34]
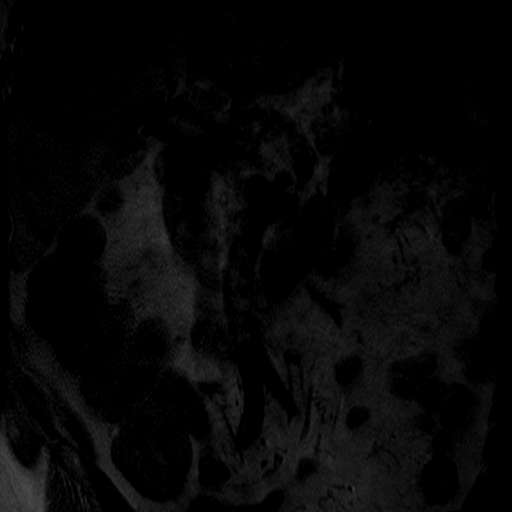

[Series 6: T2 · axial · 4.0mm · 0.51mm/px · z∈[-68,+149]mm · 7 of 45 slices shown (2 of 2)]
[im 1/45]
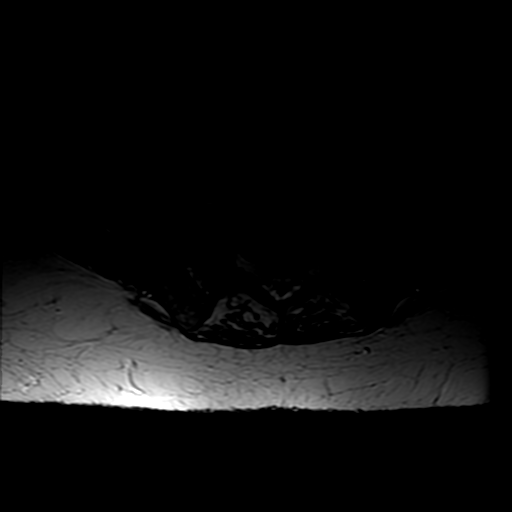
[im 9/45]
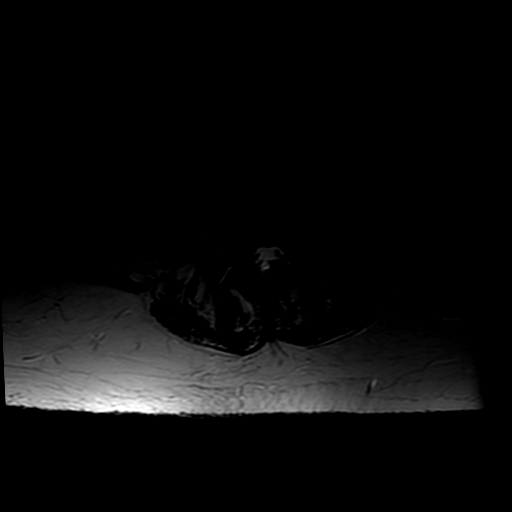
[im 13/45]
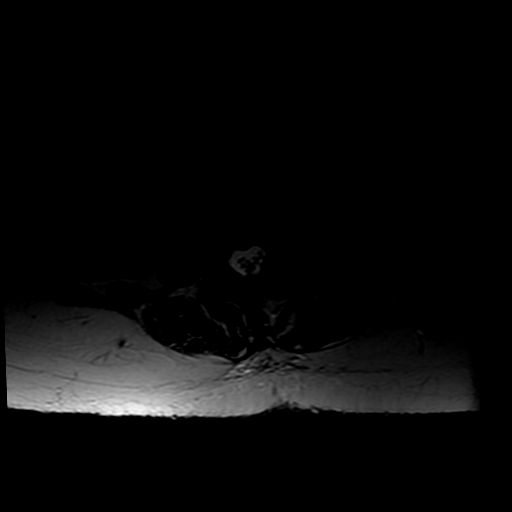
[im 21/45]
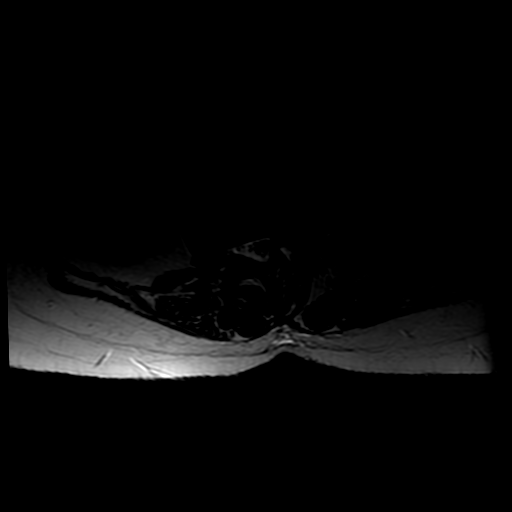
[im 25/45]
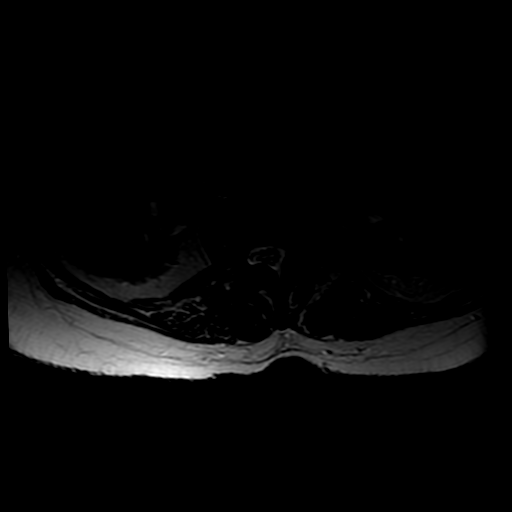
[im 33/45]
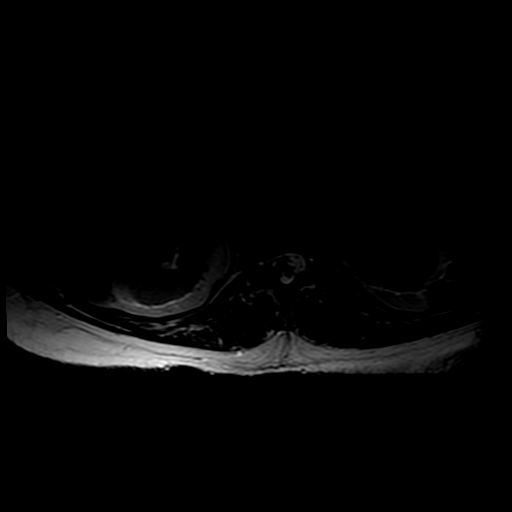
[im 41/45]
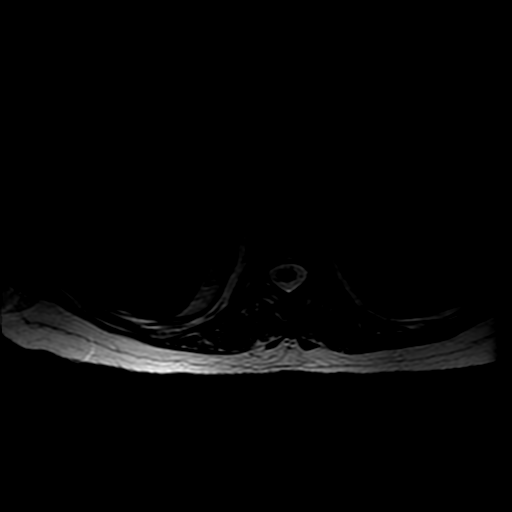

[18 of 48 positions shown; findings below may reference images not displayed]

FINDINGS: Normal signal is present in the conus medullaris which terminates at
T12-L1. Severe rightward curvature of the lumbar spine is centered
at L2-3. There is asymmetric left-sided endplate marrow change at
L2-3 and right-sided endplate marrow change at L1-2. Vertebral body
heights maintained. There is slight retrolisthesis at L2-3. AP
alignment is otherwise anatomic.

Limited imaging of the abdomen demonstrates a benign cyst in the
right kidney posteriorly. No other focal lesions are evident.

T12-L1:  Negative.

L1-2: A broad-based disc protrusion is present. There is mild facet
hypertrophy bilaterally. Mild foraminal narrowing is present on the
right.

L2-3: A broad-based disc protrusion is asymmetric to the left. This
results in moderate left subarticular stenosis. Asymmetric
left-sided facet hypertrophy contributes. Moderate to severe left
foraminal narrowing is present. There is mild right foraminal
stenosis.

L3-4: A broad-based disc protrusion is asymmetric to the left. Mild
to moderate left subarticular narrowing is present. Moderate left
and mild right foraminal narrowing is present as well.

L4-5: Moderate facet hypertrophy is present bilaterally. There is a
broad-based disc protrusion. This results in mild to moderate
subarticular and foraminal narrowing bilaterally, right greater than
left.

L5-S1: Asymmetric moderate facet hypertrophy is present. The central
canal is patent. Moderate right foraminal stenosis is present.
IMPRESSION: 1. Severe dextro convex scoliosis is centered at L2-3.
2. Mild foraminal narrowing on the right at L1-2.
3. Moderate left subarticular and moderate to severe left foraminal
narrowing at L2-3.
4. Mild to moderate left subarticular stenosis at L3-4.
5. Moderate left and mild right foraminal narrowing at L3-4.
6. Mild to moderate subarticular and foraminal narrowing bilaterally
at L4-5 is worse on the right.
7. Moderate right foraminal stenosis at L5-S1.

## 2017-03-18 IMAGING — CR DG PELVIS 1-2V
1 series · 1 of 1 positions shown · non-contrast
Comparison: None.

CLINICAL DATA: Preoperative examination.  Right hip pain.

EXAM:
PELVIS - 1-2 VIEW

[towne view]
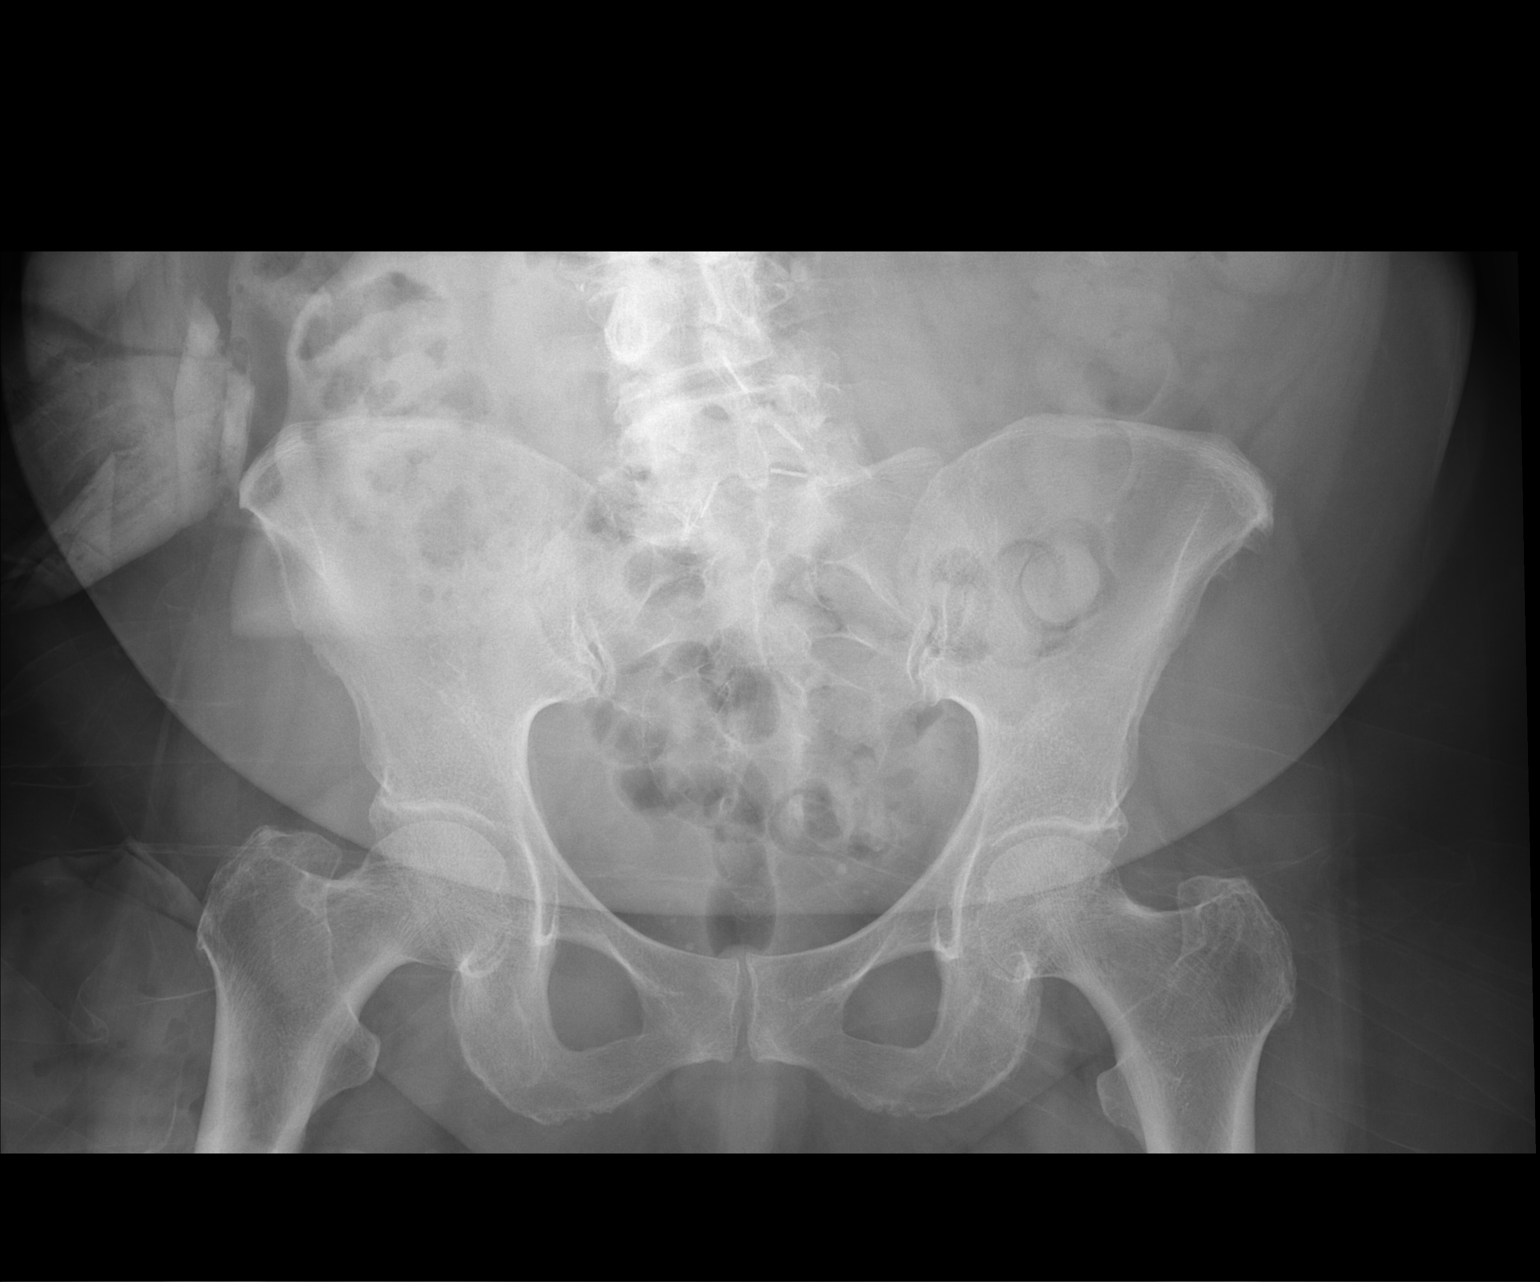

[1 of 1 positions shown; findings below may reference images not displayed]

FINDINGS: There is no evidence of pelvic fracture or diastasis. No pelvic bone
lesions are seen. Visualized hips appear intact.
IMPRESSION: Negative.

## 2017-03-24 DIAGNOSIS — L820 Inflamed seborrheic keratosis: Secondary | ICD-10-CM | POA: Diagnosis not present

## 2017-03-24 DIAGNOSIS — L308 Other specified dermatitis: Secondary | ICD-10-CM | POA: Diagnosis not present

## 2017-03-24 DIAGNOSIS — L309 Dermatitis, unspecified: Secondary | ICD-10-CM | POA: Diagnosis not present

## 2017-03-24 DIAGNOSIS — L821 Other seborrheic keratosis: Secondary | ICD-10-CM | POA: Diagnosis not present

## 2017-03-27 DIAGNOSIS — Z01411 Encounter for gynecological examination (general) (routine) with abnormal findings: Secondary | ICD-10-CM | POA: Diagnosis not present

## 2017-03-27 DIAGNOSIS — N951 Menopausal and female climacteric states: Secondary | ICD-10-CM | POA: Diagnosis not present

## 2017-03-27 DIAGNOSIS — R6882 Decreased libido: Secondary | ICD-10-CM | POA: Diagnosis not present

## 2017-03-27 DIAGNOSIS — N898 Other specified noninflammatory disorders of vagina: Secondary | ICD-10-CM | POA: Diagnosis not present

## 2017-03-27 DIAGNOSIS — R102 Pelvic and perineal pain: Secondary | ICD-10-CM | POA: Diagnosis not present

## 2017-03-29 DIAGNOSIS — M76821 Posterior tibial tendinitis, right leg: Secondary | ICD-10-CM | POA: Diagnosis not present

## 2017-03-29 DIAGNOSIS — M76822 Posterior tibial tendinitis, left leg: Secondary | ICD-10-CM | POA: Diagnosis not present

## 2017-03-29 DIAGNOSIS — M2012 Hallux valgus (acquired), left foot: Secondary | ICD-10-CM | POA: Diagnosis not present

## 2017-03-29 DIAGNOSIS — M2011 Hallux valgus (acquired), right foot: Secondary | ICD-10-CM | POA: Diagnosis not present

## 2017-03-30 MED FILL — TRIAMCINOLONE 0.1% CREAM: 0.1 | 14 days supply | Qty: 80 | Fill #0

## 2017-04-07 MED FILL — SYNTHROID 25 MCG TABLET: 25 | 90 days supply | Qty: 90 | Fill #3

## 2017-04-07 MED FILL — L-METHYLFOLATE-ALGAE 15-90.: 15-90.314 | 30 days supply | Qty: 30 | Fill #4

## 2017-04-13 MED FILL — DAYTRANA 15 MG/9 HR PATCH: 15 | 30 days supply | Qty: 30 | Fill #0

## 2017-04-14 MED FILL — MELOXICAM 7.5 MG TABLET: 7.5 | 30 days supply | Qty: 30 | Fill #0

## 2017-04-20 ENCOUNTER — Encounter (HOSPITAL_BASED_OUTPATIENT_CLINIC_OR_DEPARTMENT_OTHER): Payer: Self-pay | Admitting: Physician Assistant

## 2017-04-20 DIAGNOSIS — M7542 Impingement syndrome of left shoulder: Secondary | ICD-10-CM | POA: Diagnosis present

## 2017-04-20 DIAGNOSIS — M75102 Unspecified rotator cuff tear or rupture of left shoulder, not specified as traumatic: Secondary | ICD-10-CM | POA: Diagnosis present

## 2017-04-20 DIAGNOSIS — M5010 Cervical disc disorder with radiculopathy, unspecified cervical region: Secondary | ICD-10-CM

## 2017-04-20 DIAGNOSIS — M19012 Primary osteoarthritis, left shoulder: Secondary | ICD-10-CM | POA: Diagnosis present

## 2017-04-20 DIAGNOSIS — E119 Type 2 diabetes mellitus without complications: Secondary | ICD-10-CM

## 2017-04-20 DIAGNOSIS — M25812 Other specified joint disorders, left shoulder: Secondary | ICD-10-CM | POA: Diagnosis present

## 2017-04-20 HISTORY — DX: Cervical disc disorder with radiculopathy, unspecified cervical region: M50.10

## 2017-04-20 NOTE — H&P (Signed)
Rita Gregory is an 72 y.o. female.   Chief Complaint: left shoulder rotator cuff tear HPI: Rita Gregory is a 72 year-old seen for follow up evaluation from her significant persistent left shoulder pain.  She had undergone an MRI of the shoulder that revealed a partial versus complete rotator cuff tear with significant muscular atrophy, but a very small tear.  We have injected her shoulder twice, once was three weeks ago and previously three months ago with only temporary relief.  Pain with overhead use and activity, relieved by rest.  She sees Dr. Legrand Como [place] for her regular medical care.    Past Medical History:  Diagnosis Date  . Arthritis of left acromioclavicular joint   . Cervical disc disorder with left arm radiculopathy of cervical region 04/20/2017  . Chronic pain    Dr. Ellene Route follows  . Colon polyp 11/09/2007   Hyperplastic   . Diabetes ([place]) 04/20/2017  . Diverticulosis of colon (without mention of hemorrhage) 11/09/2007  . Esophageal reflux   . Fibromyalgia   . Hypertension   . Kyphoscoliosis    40 degree bend ro right side  . Migraine   . Paroxysmal spells 02/13/2013  . Patent foramen ovale    hx.- Dr. Lemmie Evens. Smith,cardiology- follows  . Rotator cuff tear, non-traumatic, left   . Shoulder impingement, left     Past Surgical History:  Procedure Laterality Date  . ABDOMINAL HYSTERECTOMY    . ANTERIOR LATERAL LUMBAR FUSION 4 LEVELS Left 06/16/2015   Procedure: ANTERIOR LATERAL LUMBAR FUSION THORACIC TWELVE-LUMBAR FOUR;  Surgeon: Kristeen Miss, MD;  Location: [facility];  Service: Neurosurgery;  Laterality: Left;  Thoracolumbar spine  . APPENDECTOMY    . CESAREAN SECTION     x2  . CHOLECYSTECTOMY    . dry eyes     uses Restasis daily  . ESOPHAGOGASTRODUODENOSCOPY  02/23/2012   Procedure: ESOPHAGOGASTRODUODENOSCOPY (EGD);  Surgeon: Irene Shipper, MD;  Location: [facility];  Service: Endoscopy;  Laterality: N/A;  . JOINT REPLACEMENT     RTKA '05  . KNEE  ARTHROSCOPY W/ ACL RECONSTRUCTION     Rt. Knee  . POSTERIOR LUMBAR FUSION 4 LEVEL N/A 06/16/2015   Procedure: POSTERIOR LUMBAR FUSION LUMBAR FOUR-FIVE LUMBAR FIVE-SACRAL ONE;  Surgeon: Kristeen Miss, MD;  Location: [facility];  Service: Neurosurgery;  Laterality: N/A;  . RADIOLOGY WITH ANESTHESIA N/A 06/15/2015   Procedure: RADIOLOGY WITH ANESTHESIA;  Surgeon: Medication Radiologist, MD;  Location: [facility];  Service: Radiology;  Laterality: N/A;  . right rotator cuff  2011   sx    Family History  Problem Relation Age of Onset  . Ovarian cancer Mother        passed at age 98  . Heart disease Father   . Kidney disease Father        Kidney cancer  . Diabetes type II Father        passed at 43 yrs old  . Colon cancer Father        cancereous polyps  . Stomach cancer Paternal Uncle   . Bipolar disorder Sister        Osteopenia  . Bipolar disorder Son    Social History:  reports that she is a non-smoker but has been exposed to tobacco smoke. She does not have any smokeless tobacco history on file. She reports that she drinks about 0.5 - 1.5 oz of alcohol per week. She reports that she does not use drugs.  Allergies:  Allergies  Allergen  Reactions  . Fentanyl Shortness Of Breath    Pt has tolerated this medication 06/2015 pre and during operation. MD ok giving.  . Morphine And Related Anaphylaxis    respiratory depression   . Bupropion Hcl     Dizzy, fog  . Codeine Nausea And Vomiting  . Demerol [Meperidine] Nausea And Vomiting  . Dextromethorphan Other (See Comments)    Unknown   . Guaifenesin & Derivatives     Other, unknown  . Lipitor [Atorvastatin]   . Lovaza [Omega-3-Acid Ethyl Esters] Diarrhea  . Lyrica [Pregabalin]     Fatigue and depression  . Naltrexone     depression  . Percodan [Oxycodone-Aspirin] Nausea And Vomiting  . Prilosec [Omeprazole]     Iliacs    . Topamax [Topiramate]     disorientation   . Valium [Diazepam]     Small doses are OK but large doses are  not. Long-term use causes depression.   . Versed [Midazolam]     In moderate in large doses shortness of breath, and respiratory depression   . Adhesive [Tape] Rash    Skin irritation   . Concerta [Methylphenidate] Palpitations  . Lamotrigine Rash  . Nickel Rash    Skin irritation   . Oxycodone-Acetaminophen Nausea And Vomiting and Rash    depression  . Percocet [Oxycodone-Acetaminophen] Nausea And Vomiting and Rash  . Tegaderm Ag Mesh [Silver] Rash    No medications prior to admission.    No results found for this or any previous visit (from the past 48 hour(s)). No results found.  Review of Systems  Constitutional: Negative.   HENT: Negative.   Eyes: Negative.   Respiratory: Negative.   Cardiovascular: Negative.   Gastrointestinal: Negative.   Genitourinary: Negative.   Musculoskeletal: Positive for back pain, joint pain and neck pain.  Skin: Negative.   Neurological: Negative.   Endo/Heme/Allergies: Negative.   Psychiatric/Behavioral: Negative.     There were no vitals taken for this visit. Physical Exam  Constitutional: She is oriented to person, place, and time. She appears well-developed and well-nourished.  HENT:  Head: Normocephalic and atraumatic.  Mouth/Throat: Oropharynx is clear and moist.  Eyes: Pupils are equal, round, and reactive to light.  Neck: Neck supple.  Cardiovascular: Normal rate.  Respiratory: Effort normal.  GI: Soft.  Genitourinary:  Genitourinary Comments: Not pertinent to current symptomatology therefore not examined.  Musculoskeletal:  Examination of her left shoulder reveals forward flexion of 170 with pain and mild weakness.  Abduction of 160 degrees with pain and weakness.  Internal and external rotation of 70 degrees with pain and weakness.  No instability.  Examination of her right shoulder reveals full range of motion without pain, weakness or instability.    Neurological: She is alert and oriented to person, place, and time.   Skin: Skin is warm and dry.  Psychiatric: She has a normal mood and affect.      Assessment Principal Problem:   Rotator cuff tear, non-traumatic, left Active Problems:   GERD (gastroesophageal reflux disease)   Lumbar spondylolysis   Shoulder impingement, left   Arthritis of left acromioclavicular joint   Diabetes (HCC)   Cervical disc disorder with left arm radiculopathy of cervical region  PLAN: I have talked to her and her husband about this in detail.  Would recommend with these findings and her persistent significant pain and weakness that we proceed with left shoulder arthroscopy with rotator cuff debridement versus repair.  Risks, complications and benefits of the surgery have  been described to them in detail and they understand this completely.  We will plan on setting her up for this at some point in the near future.    Linda Hedges, PA-C 04/20/2017, 11:17 AM

## 2017-04-24 ENCOUNTER — Encounter (HOSPITAL_BASED_OUTPATIENT_CLINIC_OR_DEPARTMENT_OTHER): Payer: Self-pay | Admitting: *Deleted

## 2017-04-24 ENCOUNTER — Other Ambulatory Visit: Payer: Self-pay

## 2017-04-24 DIAGNOSIS — E559 Vitamin D deficiency, unspecified: Secondary | ICD-10-CM | POA: Diagnosis not present

## 2017-04-24 DIAGNOSIS — R7302 Impaired glucose tolerance (oral): Secondary | ICD-10-CM | POA: Diagnosis not present

## 2017-04-24 DIAGNOSIS — E782 Mixed hyperlipidemia: Secondary | ICD-10-CM | POA: Diagnosis not present

## 2017-04-24 DIAGNOSIS — I10 Essential (primary) hypertension: Secondary | ICD-10-CM | POA: Diagnosis not present

## 2017-04-24 DIAGNOSIS — K219 Gastro-esophageal reflux disease without esophagitis: Secondary | ICD-10-CM | POA: Diagnosis not present

## 2017-04-24 DIAGNOSIS — E039 Hypothyroidism, unspecified: Secondary | ICD-10-CM | POA: Diagnosis not present

## 2017-04-24 DIAGNOSIS — N951 Menopausal and female climacteric states: Secondary | ICD-10-CM | POA: Diagnosis not present

## 2017-04-24 DIAGNOSIS — M797 Fibromyalgia: Secondary | ICD-10-CM | POA: Diagnosis not present

## 2017-04-24 DIAGNOSIS — M159 Polyosteoarthritis, unspecified: Secondary | ICD-10-CM | POA: Diagnosis not present

## 2017-04-27 NOTE — Anesthesia Preprocedure Evaluation (Addendum)
Anesthesia Evaluation  Patient identified by MRN, date of birth, ID band Patient awake    Reviewed: Allergy & Precautions, H&P , NPO status , Patient's Chart, lab work & pertinent test results  Airway Mallampati: II  TM Distance: >3 FB Neck ROM: full    Dental no notable dental hx. (+) Teeth Intact, Dental Advisory Given   Pulmonary asthma ,    Pulmonary exam normal breath sounds clear to auscultation       Cardiovascular hypertension, Pt. on medications Normal cardiovascular exam Rhythm:regular Rate:Normal  Patent foramen ovale   Neuro/Psych  Headaches, PSYCHIATRIC DISORDERS Anxiety Depression scoliosis  Neuromuscular disease    GI/Hepatic Neg liver ROS, GERD  Medicated and Controlled,Diverticulosis   Endo/Other  Hypothyroidism   Renal/GU negative Renal ROS     Musculoskeletal  (+) Fibromyalgia -, narcotic dependentIntractable LBP Kyphoscoliosis   Abdominal (+) + obese,   Peds  Hematology HLD   Anesthesia Other Findings LEFT SHOULDER CARTILDGE DISORDER  ROTATER CUFF TEAR  Reproductive/Obstetrics                            Anesthesia Physical  Anesthesia Plan  ASA: III  Anesthesia Plan: General and Regional   Post-op Pain Management: GA combined w/ Regional for post-op pain   Induction: Intravenous  PONV Risk Score and Plan: 3 and Dexamethasone, Ondansetron, Treatment may vary due to age or medical condition and Midazolam  Airway Management Planned: Oral ETT  Additional Equipment:   Intra-op Plan:   Post-operative Plan: Extubation in OR  Informed Consent: I have reviewed the patients History and Physical, chart, labs and discussed the procedure including the risks, benefits and alternatives for the proposed anesthesia with the patient or authorized representative who has indicated his/her understanding and acceptance.   Dental Advisory Given  Plan Discussed with:  CRNA  Anesthesia Plan Comments: (Multiple sensativities to anesthetic medications)       Anesthesia Quick Evaluation

## 2017-04-28 ENCOUNTER — Ambulatory Visit (HOSPITAL_BASED_OUTPATIENT_CLINIC_OR_DEPARTMENT_OTHER): Payer: 59 | Admitting: Anesthesiology

## 2017-04-28 ENCOUNTER — Encounter (HOSPITAL_BASED_OUTPATIENT_CLINIC_OR_DEPARTMENT_OTHER)
Admission: RE | Disposition: A | Discharge status: Home / Self Care | Payer: Self-pay | Source: Ambulatory Visit | Attending: Orthopedic Surgery

## 2017-04-28 ENCOUNTER — Ambulatory Visit (HOSPITAL_BASED_OUTPATIENT_CLINIC_OR_DEPARTMENT_OTHER)
Admission: RE | Admit: 2017-04-28 | Discharge: 2017-04-28 | Disposition: A | Discharge status: Home / Self Care | Payer: 59 | Source: Ambulatory Visit | Attending: Orthopedic Surgery | Admitting: Orthopedic Surgery

## 2017-04-28 ENCOUNTER — Other Ambulatory Visit: Payer: Self-pay

## 2017-04-28 ENCOUNTER — Encounter (HOSPITAL_BASED_OUTPATIENT_CLINIC_OR_DEPARTMENT_OTHER): Payer: Self-pay

## 2017-04-28 DIAGNOSIS — E119 Type 2 diabetes mellitus without complications: Secondary | ICD-10-CM | POA: Diagnosis not present

## 2017-04-28 DIAGNOSIS — S43402A Unspecified sprain of left shoulder joint, initial encounter: Secondary | ICD-10-CM | POA: Diagnosis not present

## 2017-04-28 DIAGNOSIS — G8918 Other acute postprocedural pain: Secondary | ICD-10-CM | POA: Diagnosis not present

## 2017-04-28 DIAGNOSIS — I10 Essential (primary) hypertension: Secondary | ICD-10-CM | POA: Diagnosis not present

## 2017-04-28 DIAGNOSIS — M19012 Primary osteoarthritis, left shoulder: Secondary | ICD-10-CM | POA: Diagnosis not present

## 2017-04-28 DIAGNOSIS — Z7722 Contact with and (suspected) exposure to environmental tobacco smoke (acute) (chronic): Secondary | ICD-10-CM | POA: Diagnosis not present

## 2017-04-28 DIAGNOSIS — S46212A Strain of muscle, fascia and tendon of other parts of biceps, left arm, initial encounter: Secondary | ICD-10-CM | POA: Diagnosis not present

## 2017-04-28 DIAGNOSIS — M75102 Unspecified rotator cuff tear or rupture of left shoulder, not specified as traumatic: Secondary | ICD-10-CM | POA: Insufficient documentation

## 2017-04-28 DIAGNOSIS — M7542 Impingement syndrome of left shoulder: Secondary | ICD-10-CM | POA: Diagnosis not present

## 2017-04-28 DIAGNOSIS — Q211 Atrial septal defect: Secondary | ICD-10-CM | POA: Diagnosis not present

## 2017-04-28 DIAGNOSIS — K219 Gastro-esophageal reflux disease without esophagitis: Secondary | ICD-10-CM | POA: Insufficient documentation

## 2017-04-28 DIAGNOSIS — X58XXXA Exposure to other specified factors, initial encounter: Secondary | ICD-10-CM | POA: Diagnosis not present

## 2017-04-28 DIAGNOSIS — M24112 Other articular cartilage disorders, left shoulder: Secondary | ICD-10-CM | POA: Diagnosis not present

## 2017-04-28 DIAGNOSIS — M4306 Spondylolysis, lumbar region: Secondary | ICD-10-CM | POA: Diagnosis present

## 2017-04-28 DIAGNOSIS — M5010 Cervical disc disorder with radiculopathy, unspecified cervical region: Secondary | ICD-10-CM

## 2017-04-28 HISTORY — DX: Other specified joint disorders, left shoulder: M25.812

## 2017-04-28 HISTORY — DX: Cervical disc disorder with radiculopathy, unspecified cervical region: M50.10

## 2017-04-28 HISTORY — DX: Impingement syndrome of left shoulder: M75.42

## 2017-04-28 HISTORY — PX: SHOULDER ARTHROSCOPY: SHX128

## 2017-04-28 HISTORY — DX: Primary osteoarthritis, left shoulder: M19.012

## 2017-04-28 HISTORY — DX: Major depressive disorder, single episode, unspecified: F32.9

## 2017-04-28 HISTORY — DX: Unspecified rotator cuff tear or rupture of left shoulder, not specified as traumatic: M75.102

## 2017-04-28 HISTORY — DX: Prediabetes: R73.03

## 2017-04-28 HISTORY — DX: Depression, unspecified: F32.A

## 2017-04-28 HISTORY — DX: Hypothyroidism, unspecified: E03.9

## 2017-04-28 SURGERY — ARTHROSCOPY, SHOULDER
Anesthesia: Regional | Site: Shoulder | Laterality: Left

## 2017-04-28 MED ORDER — LACTATED RINGERS IV SOLN
INTRAVENOUS | Status: DC
  Administered 2017-04-28 (×2): via INTRAVENOUS

## 2017-04-28 MED ORDER — PROPOFOL 10 MG/ML IV BOLUS
INTRAVENOUS | Status: AC
  Filled 2017-04-28: qty 20

## 2017-04-28 MED ORDER — CEFAZOLIN SODIUM-DEXTROSE 2-4 GM/100ML-% IV SOLN
INTRAVENOUS | Status: AC
  Filled 2017-04-28: qty 100

## 2017-04-28 MED ORDER — FENTANYL CITRATE (PF) 100 MCG/2ML IJ SOLN
INTRAMUSCULAR | Status: AC
  Filled 2017-04-28: qty 2

## 2017-04-28 MED ORDER — LACTATED RINGERS IV SOLN
INTRAVENOUS | Status: DC
  Administered 2017-04-28 (×2): via INTRAVENOUS

## 2017-04-28 MED ORDER — EPHEDRINE SULFATE 50 MG/ML IJ SOLN
INTRAMUSCULAR | Status: DC | PRN
  Administered 2017-04-28: 10 mg via INTRAVENOUS

## 2017-04-28 MED ORDER — PROPOFOL 10 MG/ML IV BOLUS
INTRAVENOUS | Status: DC | PRN
  Administered 2017-04-28: 150 mg via INTRAVENOUS
  Administered 2017-04-28: 50 mg via INTRAVENOUS

## 2017-04-28 MED ORDER — BUPIVACAINE-EPINEPHRINE (PF) 0.5% -1:200000 IJ SOLN
INTRAMUSCULAR | Status: AC
  Filled 2017-04-28: qty 30

## 2017-04-28 MED ORDER — POVIDONE-IODINE 7.5 % EX SOLN: Freq: Once | CUTANEOUS | Status: DC

## 2017-04-28 MED ORDER — ONDANSETRON HCL 4 MG/2ML IJ SOLN
INTRAMUSCULAR | Status: DC | PRN
  Administered 2017-04-28: 4 mg via INTRAVENOUS

## 2017-04-28 MED ORDER — ONDANSETRON HCL 4 MG/2ML IJ SOLN
INTRAMUSCULAR | Status: AC
  Filled 2017-04-28: qty 2

## 2017-04-28 MED ORDER — LIDOCAINE HCL (CARDIAC) 20 MG/ML IV SOLN
INTRAVENOUS | Status: DC | PRN
  Administered 2017-04-28: 30 mg via INTRAVENOUS

## 2017-04-28 MED ORDER — SCOPOLAMINE 1 MG/3DAYS TD PT72: 1.0000 | MEDICATED_PATCH | Freq: Once | TRANSDERMAL | Status: DC | PRN

## 2017-04-28 MED ORDER — SUCCINYLCHOLINE CHLORIDE 200 MG/10ML IV SOSY
PREFILLED_SYRINGE | INTRAVENOUS | Status: AC
  Filled 2017-04-28: qty 10

## 2017-04-28 MED ORDER — SUCCINYLCHOLINE CHLORIDE 20 MG/ML IJ SOLN
INTRAMUSCULAR | Status: DC | PRN
  Administered 2017-04-28: 50 mg via INTRAVENOUS

## 2017-04-28 MED ORDER — ROPIVACAINE HCL 7.5 MG/ML IJ SOLN
INTRAMUSCULAR | Status: DC | PRN
  Administered 2017-04-28: 20 mL via PERINEURAL

## 2017-04-28 MED ORDER — CEFAZOLIN SODIUM-DEXTROSE 2-4 GM/100ML-% IV SOLN
2.0000 g | INTRAVENOUS | Status: AC
  Administered 2017-04-28: 2 g via INTRAVENOUS

## 2017-04-28 MED ORDER — DEXAMETHASONE SODIUM PHOSPHATE 4 MG/ML IJ SOLN
INTRAMUSCULAR | Status: DC | PRN
  Administered 2017-04-28: 10 mg via INTRAVENOUS

## 2017-04-28 MED ORDER — MIDAZOLAM HCL 2 MG/2ML IJ SOLN
INTRAMUSCULAR | Status: AC
  Filled 2017-04-28: qty 2

## 2017-04-28 MED ORDER — EPINEPHRINE 30 MG/30ML IJ SOLN
INTRAMUSCULAR | Status: AC
  Filled 2017-04-28: qty 1

## 2017-04-28 MED ORDER — DEXAMETHASONE SODIUM PHOSPHATE 10 MG/ML IJ SOLN
INTRAMUSCULAR | Status: AC
  Filled 2017-04-28: qty 1

## 2017-04-28 MED ORDER — MIDAZOLAM HCL 2 MG/2ML IJ SOLN
1.0000 mg | INTRAMUSCULAR | Status: DC | PRN
  Administered 2017-04-28: 0.5 mg via INTRAVENOUS

## 2017-04-28 MED ORDER — LIDOCAINE 2% (20 MG/ML) 5 ML SYRINGE
INTRAMUSCULAR | Status: AC
  Filled 2017-04-28: qty 5

## 2017-04-28 MED ORDER — EPHEDRINE 5 MG/ML INJ
INTRAVENOUS | Status: AC
  Filled 2017-04-28: qty 10

## 2017-04-28 MED ORDER — FENTANYL CITRATE (PF) 100 MCG/2ML IJ SOLN
50.0000 ug | INTRAMUSCULAR | Status: DC | PRN
  Administered 2017-04-28: 25 ug via INTRAVENOUS

## 2017-04-28 SURGICAL SUPPLY — 75 items
AID PSTN UNV HD RSTRNT DISP (MISCELLANEOUS) ×2
APL SKNCLS STERI-STRIP NONHPOA (GAUZE/BANDAGES/DRESSINGS)
BENZOIN TINCTURE PRP APPL 2/3 (GAUZE/BANDAGES/DRESSINGS) IMPLANT
BLADE CUDA 5.5 (BLADE) IMPLANT
BLADE CUTTER GATOR 3.5 (BLADE) ×3 IMPLANT
BLADE GREAT WHITE 4.2 (BLADE) IMPLANT
BLADE SURG 15 STRL LF DISP TIS (BLADE) IMPLANT
BLADE SURG 15 STRL SS (BLADE)
BNDG COHESIVE 4X5 TAN STRL (GAUZE/BANDAGES/DRESSINGS) ×3 IMPLANT
BUR OVAL 6.0 (BURR) ×3 IMPLANT
CANNULA TWIST IN 8.25X7CM (CANNULA) IMPLANT
DECANTER SPIKE VIAL GLASS SM (MISCELLANEOUS) IMPLANT
DRAPE SHOULDER BEACH CHAIR (DRAPES) ×3 IMPLANT
DRAPE U-SHAPE 47X51 STRL (DRAPES) ×6 IMPLANT
DRSG PAD ABDOMINAL 8X10 ST (GAUZE/BANDAGES/DRESSINGS) ×3 IMPLANT
DURAPREP 26ML APPLICATOR (WOUND CARE) ×3 IMPLANT
ELECT REM PT RETURN 9FT ADLT (ELECTROSURGICAL) ×3
ELECTRODE REM PT RTRN 9FT ADLT (ELECTROSURGICAL) ×2 IMPLANT
GAUZE SPONGE 4X4 12PLY STRL (GAUZE/BANDAGES/DRESSINGS) ×3 IMPLANT
GAUZE XEROFORM 1X8 LF (GAUZE/BANDAGES/DRESSINGS) ×3 IMPLANT
GLOVE BIO SURGEON STRL SZ7 (GLOVE) IMPLANT
GLOVE BIOGEL PI IND STRL 7.0 (GLOVE) IMPLANT
GLOVE BIOGEL PI IND STRL 7.5 (GLOVE) ×2 IMPLANT
GLOVE BIOGEL PI INDICATOR 7.0 (GLOVE)
GLOVE BIOGEL PI INDICATOR 7.5 (GLOVE) ×1
GLOVE SS BIOGEL STRL SZ 7.5 (GLOVE) ×2 IMPLANT
GLOVE SUPERSENSE BIOGEL SZ 7.5 (GLOVE) ×1
GOWN STRL REUS W/ TWL LRG LVL3 (GOWN DISPOSABLE) ×4 IMPLANT
GOWN STRL REUS W/ TWL XL LVL3 (GOWN DISPOSABLE) ×2 IMPLANT
GOWN STRL REUS W/TWL LRG LVL3 (GOWN DISPOSABLE) ×4
GOWN STRL REUS W/TWL XL LVL3 (GOWN DISPOSABLE) ×3
LOOP 2 FIBERLINK CLOSED (SUTURE) IMPLANT
MANIFOLD NEPTUNE II (INSTRUMENTS) ×3 IMPLANT
NDL SAFETY ECLIPSE 18X1.5 (NEEDLE) ×2 IMPLANT
NDL SUT 6 .5 CRC .975X.05 MAYO (NEEDLE) IMPLANT
NEEDLE 1/2 CIR CATGUT .05X1.09 (NEEDLE) IMPLANT
NEEDLE HYPO 18GX1.5 SHARP (NEEDLE) ×3
NEEDLE MAYO TAPER (NEEDLE)
NEEDLE SCORPION MULTI FIRE (NEEDLE) IMPLANT
PACK ARTHROSCOPY DSU (CUSTOM PROCEDURE TRAY) ×3 IMPLANT
PACK BASIN DAY SURGERY FS (CUSTOM PROCEDURE TRAY) ×3 IMPLANT
PAD ALCOHOL SWAB (MISCELLANEOUS) ×6 IMPLANT
PENCIL BUTTON HOLSTER BLD 10FT (ELECTRODE) IMPLANT
RESTRAINT HEAD UNIVERSAL NS (MISCELLANEOUS) ×3 IMPLANT
SHEET MEDIUM DRAPE 40X70 STRL (DRAPES) IMPLANT
SLEEVE SCD COMPRESS KNEE MED (MISCELLANEOUS) IMPLANT
SLING ARM FOAM STRAP LRG (SOFTGOODS) IMPLANT
SLING ARM IMMOBILIZER MED (SOFTGOODS) IMPLANT
SLING ARM MED ADULT FOAM STRAP (SOFTGOODS) IMPLANT
SLING ARM XL FOAM STRAP (SOFTGOODS) IMPLANT
SLING ULTRA III MED (ORTHOPEDIC SUPPLIES) IMPLANT
SPONGE LAP 4X18 X RAY DECT (DISPOSABLE) IMPLANT
STRIP CLOSURE SKIN 1/2X4 (GAUZE/BANDAGES/DRESSINGS) IMPLANT
SUCTION FRAZIER HANDLE 10FR (MISCELLANEOUS)
SUCTION TUBE FRAZIER 10FR DISP (MISCELLANEOUS) IMPLANT
SUT ETHILON 3 0 PS 1 (SUTURE) ×3 IMPLANT
SUT FIBERWIRE #2 38 T-5 BLUE (SUTURE)
SUT PDS AB 2-0 CT2 27 (SUTURE) IMPLANT
SUT PROLENE 3 0 PS 2 (SUTURE) IMPLANT
SUT TIGER TAPE 7 IN WHITE (SUTURE) IMPLANT
SUT VIC AB 0 SH 27 (SUTURE) IMPLANT
SUT VIC AB 2-0 PS2 27 (SUTURE) IMPLANT
SUT VIC AB 2-0 SH 27 (SUTURE)
SUT VIC AB 2-0 SH 27XBRD (SUTURE) IMPLANT
SUTURE FIBERWR #2 38 T-5 BLUE (SUTURE) IMPLANT
SYR 5ML LL (SYRINGE) ×3 IMPLANT
SYR BULB 3OZ (MISCELLANEOUS) IMPLANT
TAPE FIBER 2MM 7IN #2 BLUE (SUTURE) IMPLANT
TAPE HYPAFIX 6X30 (GAUZE/BANDAGES/DRESSINGS) IMPLANT
TAPE STRIPS DRAPE STRL (GAUZE/BANDAGES/DRESSINGS) ×3 IMPLANT
TOWEL OR 17X24 6PK STRL BLUE (TOWEL DISPOSABLE) ×3 IMPLANT
TUBE CONNECTING 20X1/4 (TUBING) IMPLANT
TUBING ARTHRO INFLOW-ONLY STRL (TUBING) ×3 IMPLANT
WAND STAR VAC 90 (SURGICAL WAND) ×3 IMPLANT
WATER STERILE IRR 1000ML POUR (IV SOLUTION) ×3 IMPLANT

## 2017-04-28 NOTE — Anesthesia Postprocedure Evaluation (Signed)
Anesthesia Post Note  Patient: Rita Gregory  Procedure(s) Performed: ARTHROSCOPY SHOULDER WITH EXTENSIVE DEBRIDEMENT ROTATOR CUFF TEAR (Left Shoulder)     Patient location during evaluation: PACU Anesthesia Type: Regional and General Level of consciousness: awake and alert Pain management: pain level controlled Vital Signs Assessment: post-procedure vital signs reviewed and stable Respiratory status: spontaneous breathing, nonlabored ventilation, respiratory function stable and patient connected to nasal cannula oxygen Cardiovascular status: blood pressure returned to baseline and stable Postop Assessment: no apparent nausea or vomiting Anesthetic complications: no    Last Vitals:  Vitals:   04/28/17 0930 04/28/17 1122  BP: 139/81 133/75  Pulse: 84 82  Resp: 15 16  Temp:  36.4 C  SpO2: 93% 95%    Last Pain:  Vitals:   04/28/17 1122  TempSrc: Oral  PainSc:                  Tammera Engert P Auston Halfmann

## 2017-04-28 NOTE — Progress Notes (Signed)
AssistedDr. Ellender with left, ultrasound guided, interscalene  block. Side rails up, monitors on throughout procedure. See vital signs in flow sheet. Tolerated Procedure well.  

## 2017-04-28 NOTE — Interval H&P Note (Signed)
History and Physical Interval Note:  04/28/2017 7:32 AM  Rita Gregory  has presented today for surgery, with the diagnosis of LEFT SHOULDER [place]  The various methods of treatment have been discussed with the patient and family. After consideration of risks, benefits and other options for treatment, the patient has consented to  Procedure(s) with comments: SHOULDER ARTHROSCOPY WITH ROTATOR CUFF REPAIR AND SHOULDER DEBRIEDEMENT (Left) - BLOCK RESECTION DISTAL CLAVICAL (Left) SUBACROMIAL DECOMPRESSION (Left) as a surgical intervention .  The patient's history has been reviewed, patient examined, no change in status, stable for surgery.  I have reviewed the patient's chart and labs.  Questions were answered to the patient's satisfaction.     Lorn Junes

## 2017-04-28 NOTE — Discharge Instructions (Signed)
Regional Anesthesia Blocks ? ?1. Numbness or the inability to move the "blocked" extremity may last from 3-48 hours after placement. The length of time depends on the medication injected and your individual response to the medication. If the numbness is not going away after 48 hours, call your surgeon. ? ?2. The extremity that is blocked will need to be protected until the numbness is gone and the  Strength has returned. Because you cannot feel it, you will need to take extra care to avoid injury. Because it may be weak, you may have difficulty moving it or using it. You may not know what position it is in without looking at it while the block is in effect. ? ?3. For blocks in the legs and feet, returning to weight bearing and walking needs to be done carefully. You will need to wait until the numbness is entirely gone and the strength has returned. You should be able to move your leg and foot normally before you try and bear weight or walk. You will need someone to be with you when you first try to ensure you do not fall and possibly risk injury. ? ?4. Bruising and tenderness at the needle site are common side effects and will resolve in a few days. ? ?5. Persistent numbness or new problems with movement should be communicated to the surgeon or the  Surgery Center ([phone])/ [facility] ([phone]).  ? ?Post Anesthesia Home Care Instructions ? ?Activity: ?Get plenty of rest for the remainder of the day. A responsible individual must stay with you for 24 hours following the procedure.  ?For the next 24 hours, DO NOT: ?-Drive a car ?-Operate machinery ?-Drink alcoholic beverages ?-Take any medication unless instructed by your physician ?-Make any legal decisions or sign important papers. ? ?Meals: ?Start with liquid foods such as gelatin or soup. Progress to regular foods as tolerated. Avoid greasy, spicy, heavy foods. If nausea and/or vomiting occur, drink only clear liquids until the  nausea and/or vomiting subsides. Call your physician if vomiting continues. ? ?Special Instructions/Symptoms: ?Your throat may feel dry or sore from the anesthesia or the breathing tube placed in your throat during surgery. If this causes discomfort, gargle with warm salt water. The discomfort should disappear within 24 hours. ? ?If you had a scopolamine patch placed behind your ear for the management of post- operative nausea and/or vomiting: ? ?1. The medication in the patch is effective for 72 hours, after which it should be removed.  Wrap patch in a tissue and discard in the trash. Wash hands thoroughly with soap and water. ?2. You may remove the patch earlier than 72 hours if you experience unpleasant side effects which may include dry mouth, dizziness or visual disturbances. ?3. Avoid touching the patch. Wash your hands with soap and water after contact with the patch. ?    ?

## 2017-04-28 NOTE — Anesthesia Procedure Notes (Signed)
Procedure Name: Intubation Date/Time: 04/28/2017 7:58 AM Performed by: Marrianne Mood, CRNA Pre-anesthesia Checklist: Patient identified, Emergency Drugs available, Suction available, Patient being monitored and Timeout performed Patient Re-evaluated:Patient Re-evaluated prior to induction Oxygen Delivery Method: Circle system utilized Preoxygenation: Pre-oxygenation with 100% oxygen Induction Type: IV induction Ventilation: Mask ventilation without difficulty Laryngoscope Size: Miller Grade View: Grade II Tube type: Oral Tube size: 7.0 mm Number of attempts: 1 Airway Equipment and Method: Stylet and Oral airway Placement Confirmation: ETT inserted through vocal cords under direct vision,  positive ETCO2 and breath sounds checked- equal and bilateral Secured at: 20 cm Tube secured with: Tape Dental Injury: Teeth and Oropharynx as per pre-operative assessment

## 2017-04-28 NOTE — Transfer of Care (Signed)
Immediate Anesthesia Transfer of Care Note  Patient: Rita Gregory  Procedure(s) Performed: SHOULDER ARTHROSCOPY WITH ROTATOR CUFF REPAIR AND SHOULDER DEBRIEDEMENT (Left ) RESECTION DISTAL CLAVICAL (Left ) SUBACROMIAL DECOMPRESSION (Left )  Patient Location: PACU  Anesthesia Type:GA combined with regional for post-op pain  Level of Consciousness: awake and patient cooperative  Airway & Oxygen Therapy: Patient Spontanous Breathing and Patient connected to face mask oxygen  Post-op Assessment: Report given to RN and Post -op Vital signs reviewed and stable  Post vital signs: Reviewed and stable  Last Vitals:  Vitals:   04/28/17 0746 04/28/17 0747  BP:    Pulse: 76 73  Resp: 15 17  Temp:    SpO2: 99% 99%    Last Pain:  Vitals:   04/28/17 0645  TempSrc: Oral  PainSc: 3       Patients Stated Pain Goal: 3 (63/14/97 0263)  Complications: No apparent anesthesia complications

## 2017-04-28 NOTE — Anesthesia Procedure Notes (Signed)
Anesthesia Regional Block: Interscalene brachial plexus block   Pre-Anesthetic Checklist: ,, timeout performed, Correct Patient, Correct Site, Correct Laterality, Correct Procedure,, site marked, risks and benefits discussed, Surgical consent,  Pre-op evaluation,  At surgeon's request and post-op pain management  Laterality: Left  Prep: chloraprep       Needles:  Injection technique: Single-shot  Needle Type: Echogenic Stimulator Needle     Needle Length: 9cm  Needle Gauge: 21     Additional Needles:   Procedures:,,,, ultrasound used (permanent image in chart),,,,  Narrative:  Start time: 04/28/2017 7:35 AM End time: 04/28/2017 7:45 AM Injection made incrementally with aspirations every 5 mL.  Performed by: Personally  Anesthesiologist: Murvin Natal, MD  Additional Notes: Functioning IV was confirmed and monitors were applied.  A 11mm 21ga Arrow echogenic stimulator needle was used. Sterile prep, hand hygiene and sterile gloves were used.  Negative aspiration and negative test dose prior to incremental administration of local anesthetic. The patient tolerated the procedure well.

## 2017-05-01 ENCOUNTER — Encounter (HOSPITAL_BASED_OUTPATIENT_CLINIC_OR_DEPARTMENT_OTHER): Payer: Self-pay | Admitting: Orthopedic Surgery

## 2017-05-01 NOTE — Op Note (Signed)
Rita Gregory, Rita Gregory            ACCOUNT NO.:  [id number]  MEDICAL RECORD NO.:  [id number]  LOCATION:                                 FACILITY:  PHYSICIAN:  Timarion Agcaoili A. Noemi Chapel, M.D.      DATE OF BIRTH:  DATE OF PROCEDURE:  04/28/2017 DATE OF DISCHARGE:                              OPERATIVE REPORT   PREOPERATIVE DIAGNOSES: 1. Left shoulder chronic traumatic irreparable rotator cuff tear. 2. Left shoulder chronic traumatic biceps tendon high-grade partial     tear. 3. Left shoulder chronic traumatic partial labrum tear.  POSTOPERATIVE DIAGNOSIS: 1. Left shoulder chronic traumatic irreparable rotator cuff tear. 2. Left shoulder chronic traumatic biceps tendon high-grade partial     tear. 3. Left shoulder chronic traumatic partial labrum tear.  PROCEDURES: 1. Left shoulder examination under anesthesia followed by arthroscopic     debridement - extensive of severe repairable rotator cuff tear and     labrum debridement. 2. Left shoulder biceps tenotomy.  SURGEON:  Audree Camel. Noemi Chapel, MD.  ASSISTANT:  Kirstin Shepperson, PA-C.  ANESTHESIA:  General.  OPERATIVE TIME:  One hour.  COMPLICATIONS:  None.  INDICATION FOR PROCEDURE:  Rita Gregory is a 72 year old woman, who has had longstanding left shoulder pain for over a year with exam and MRI documenting partial versus complete rotator cuff tear, biceps tendon partial tear, partial labrum tear with impingement.  She has failed multiple conservative modalities and is now to undergo arthroscopy with debridement versus repair.  DESCRIPTION OF PROCEDURE:  Rita Gregory was brought to the operating room on April 28, 2017, after an interscalene block was placed in the holding room by Anesthesia.  She was placed on the operative table in supine position.  She received antibiotics preoperatively for prophylaxis.  After being placed under general anesthesia, her left shoulder was examined.  She had full range of motion in her  shoulder with stable ligamentous exam.  She was then placed in a beach chair position and her shoulder and arm were prepped using sterile ChloraPrep and draped using sterile technique.  Time-out procedure was called and the correct left shoulder identified.  Initially, through a posterior arthroscopic portal, the arthroscope with a pump attached was placed into an anterior portal and arthroscopic probe was placed.  On initial inspection, the articular cartilage in the glenohumeral joint was intact.  She had partial tearing of the anterior-superior and posterior labrum 25-30% which was debrided.  The anterior-inferior labrum and anterior-inferior glenohumeral ligament complex was intact.  The biceps tendon was torn 90% and it was subluxed medially and I elected to perform a biceps tenotomy, which was performed and the stump of the biceps was debrided down to a stable base.  The rotator cuff was completely torn and retracted and irreparable with the supraspinatus and infraspinatus being retracted medial to the glenoid.  Subscap torn 50%, which was debrided.  The teres minor was intact.  After this was done, a bursectomy was carried out, but I elected not to perform a subacromial decompression because of her bald humeral head.  At this point, the shoulder could be brought through full range of motion with no impingement.  At this point, it was felt that  all pathology had been satisfactorily addressed.  The rotator cuff had been thoroughly debrided down to a stable rim and the shoulder could be brought through full range of motion.  At this point, the arthroscopic instruments were removed.  The portals were closed with 3-0 nylon suture, sterile dressings and a sling applied, and the patient awakened, taken to the recovery room in stable condition.  FOLLOWUP CARE:  Rita Gregory will be followed as an outpatient, on oxycodone for pain with early physical therapy.  She will be seen back in  office in a week for sutures out and followup.     Aleksandra Raben A. Noemi Chapel, M.D.     RAW/MEDQ  D:  04/28/2017  T:  04/29/2017  Job:  [id number]

## 2017-05-01 NOTE — Addendum Note (Signed)
Addendum  created 05/01/17 0848 by Tawni Millers, CRNA   Charge Capture section accepted, Visit diagnoses modified

## 2017-05-03 DIAGNOSIS — M7542 Impingement syndrome of left shoulder: Secondary | ICD-10-CM | POA: Diagnosis not present

## 2017-05-03 DIAGNOSIS — M75102 Unspecified rotator cuff tear or rupture of left shoulder, not specified as traumatic: Secondary | ICD-10-CM | POA: Diagnosis not present

## 2017-05-03 DIAGNOSIS — M19012 Primary osteoarthritis, left shoulder: Secondary | ICD-10-CM | POA: Diagnosis not present

## 2017-05-04 DIAGNOSIS — M24112 Other articular cartilage disorders, left shoulder: Secondary | ICD-10-CM | POA: Diagnosis not present

## 2017-05-05 MED FILL — PRAVASTATIN NA 40 MG TAB: 40 | 90 days supply | Qty: 90 | Fill #1

## 2017-05-05 MED FILL — L-METHYLFOLATE-ALGAE 15-90.: 15-90.314 | 30 days supply | Qty: 30 | Fill #5

## 2017-05-09 DIAGNOSIS — M7542 Impingement syndrome of left shoulder: Secondary | ICD-10-CM | POA: Diagnosis not present

## 2017-05-09 DIAGNOSIS — M75102 Unspecified rotator cuff tear or rupture of left shoulder, not specified as traumatic: Secondary | ICD-10-CM | POA: Diagnosis not present

## 2017-05-09 DIAGNOSIS — M19012 Primary osteoarthritis, left shoulder: Secondary | ICD-10-CM | POA: Diagnosis not present

## 2017-05-11 DIAGNOSIS — M19012 Primary osteoarthritis, left shoulder: Secondary | ICD-10-CM | POA: Diagnosis not present

## 2017-05-11 DIAGNOSIS — M7542 Impingement syndrome of left shoulder: Secondary | ICD-10-CM | POA: Diagnosis not present

## 2017-05-11 DIAGNOSIS — M75102 Unspecified rotator cuff tear or rupture of left shoulder, not specified as traumatic: Secondary | ICD-10-CM | POA: Diagnosis not present

## 2017-05-12 MED FILL — DAYTRANA 15 MG/9 HR PATCH: 15 | 30 days supply | Qty: 30 | Fill #0

## 2017-05-16 DIAGNOSIS — M75102 Unspecified rotator cuff tear or rupture of left shoulder, not specified as traumatic: Secondary | ICD-10-CM | POA: Diagnosis not present

## 2017-05-16 DIAGNOSIS — M19012 Primary osteoarthritis, left shoulder: Secondary | ICD-10-CM | POA: Diagnosis not present

## 2017-05-16 DIAGNOSIS — M7542 Impingement syndrome of left shoulder: Secondary | ICD-10-CM | POA: Diagnosis not present

## 2017-05-18 DIAGNOSIS — M75102 Unspecified rotator cuff tear or rupture of left shoulder, not specified as traumatic: Secondary | ICD-10-CM | POA: Diagnosis not present

## 2017-05-18 DIAGNOSIS — M19012 Primary osteoarthritis, left shoulder: Secondary | ICD-10-CM | POA: Diagnosis not present

## 2017-05-18 DIAGNOSIS — M7542 Impingement syndrome of left shoulder: Secondary | ICD-10-CM | POA: Diagnosis not present

## 2017-05-19 MED FILL — PREMARIN 0.3 MG TABLET: 0.3 | 90 days supply | Qty: 90 | Fill #0

## 2017-05-19 MED FILL — FENOFIBRIC ACID 135 MG CPDR: 135 | 30 days supply | Qty: 30 | Fill #3

## 2017-05-23 DIAGNOSIS — M75102 Unspecified rotator cuff tear or rupture of left shoulder, not specified as traumatic: Secondary | ICD-10-CM | POA: Diagnosis not present

## 2017-05-23 DIAGNOSIS — M19012 Primary osteoarthritis, left shoulder: Secondary | ICD-10-CM | POA: Diagnosis not present

## 2017-05-23 DIAGNOSIS — M7542 Impingement syndrome of left shoulder: Secondary | ICD-10-CM | POA: Diagnosis not present

## 2017-05-25 DIAGNOSIS — M75102 Unspecified rotator cuff tear or rupture of left shoulder, not specified as traumatic: Secondary | ICD-10-CM | POA: Diagnosis not present

## 2017-05-25 DIAGNOSIS — M7542 Impingement syndrome of left shoulder: Secondary | ICD-10-CM | POA: Diagnosis not present

## 2017-05-25 DIAGNOSIS — M19012 Primary osteoarthritis, left shoulder: Secondary | ICD-10-CM | POA: Diagnosis not present

## 2017-05-29 DIAGNOSIS — M24112 Other articular cartilage disorders, left shoulder: Secondary | ICD-10-CM | POA: Diagnosis not present

## 2017-05-30 DIAGNOSIS — M75102 Unspecified rotator cuff tear or rupture of left shoulder, not specified as traumatic: Secondary | ICD-10-CM | POA: Diagnosis not present

## 2017-05-30 DIAGNOSIS — M7542 Impingement syndrome of left shoulder: Secondary | ICD-10-CM | POA: Diagnosis not present

## 2017-05-30 DIAGNOSIS — M19012 Primary osteoarthritis, left shoulder: Secondary | ICD-10-CM | POA: Diagnosis not present

## 2017-06-08 DIAGNOSIS — M7542 Impingement syndrome of left shoulder: Secondary | ICD-10-CM | POA: Diagnosis not present

## 2017-06-08 DIAGNOSIS — M75102 Unspecified rotator cuff tear or rupture of left shoulder, not specified as traumatic: Secondary | ICD-10-CM | POA: Diagnosis not present

## 2017-06-08 DIAGNOSIS — M19012 Primary osteoarthritis, left shoulder: Secondary | ICD-10-CM | POA: Diagnosis not present

## 2017-06-08 DIAGNOSIS — S46111A Strain of muscle, fascia and tendon of long head of biceps, right arm, initial encounter: Secondary | ICD-10-CM | POA: Diagnosis not present

## 2017-06-13 DIAGNOSIS — M7542 Impingement syndrome of left shoulder: Secondary | ICD-10-CM | POA: Diagnosis not present

## 2017-06-13 DIAGNOSIS — M19012 Primary osteoarthritis, left shoulder: Secondary | ICD-10-CM | POA: Diagnosis not present

## 2017-06-13 DIAGNOSIS — M75102 Unspecified rotator cuff tear or rupture of left shoulder, not specified as traumatic: Secondary | ICD-10-CM | POA: Diagnosis not present

## 2017-06-13 MED FILL — L-METHYLFOLATE-ALGAE 15-90.: 15-90.314 | 30 days supply | Qty: 30 | Fill #0

## 2017-06-14 MED FILL — RESTASIS 0.05% EYE EMULSION: 0.05 | 90 days supply | Qty: 180 | Fill #0

## 2017-06-15 DIAGNOSIS — M19012 Primary osteoarthritis, left shoulder: Secondary | ICD-10-CM | POA: Diagnosis not present

## 2017-06-15 DIAGNOSIS — M75102 Unspecified rotator cuff tear or rupture of left shoulder, not specified as traumatic: Secondary | ICD-10-CM | POA: Diagnosis not present

## 2017-06-15 DIAGNOSIS — M7542 Impingement syndrome of left shoulder: Secondary | ICD-10-CM | POA: Diagnosis not present

## 2017-06-22 MED FILL — FENOFIBRIC ACID 135 MG CPDR: 135 | 30 days supply | Qty: 30 | Fill #4

## 2017-06-26 DIAGNOSIS — S46111D Strain of muscle, fascia and tendon of long head of biceps, right arm, subsequent encounter: Secondary | ICD-10-CM | POA: Diagnosis not present

## 2017-06-27 DIAGNOSIS — M75121 Complete rotator cuff tear or rupture of right shoulder, not specified as traumatic: Secondary | ICD-10-CM | POA: Diagnosis not present

## 2017-06-27 DIAGNOSIS — M75102 Unspecified rotator cuff tear or rupture of left shoulder, not specified as traumatic: Secondary | ICD-10-CM | POA: Diagnosis not present

## 2017-06-27 DIAGNOSIS — M19012 Primary osteoarthritis, left shoulder: Secondary | ICD-10-CM | POA: Diagnosis not present

## 2017-06-27 DIAGNOSIS — M7542 Impingement syndrome of left shoulder: Secondary | ICD-10-CM | POA: Diagnosis not present

## 2017-06-28 DIAGNOSIS — M419 Scoliosis, unspecified: Secondary | ICD-10-CM | POA: Diagnosis not present

## 2017-06-28 DIAGNOSIS — F900 Attention-deficit hyperactivity disorder, predominantly inattentive type: Secondary | ICD-10-CM | POA: Diagnosis not present

## 2017-06-28 DIAGNOSIS — G894 Chronic pain syndrome: Secondary | ICD-10-CM | POA: Diagnosis not present

## 2017-06-28 MED FILL — DAYTRANA 15 MG/9 HR PATCH: 15 | 30 days supply | Qty: 30 | Fill #0

## 2017-06-28 MED FILL — L-METHYLFOLATE-ALGAE 15-90.: 15-90.314 | 30 days supply | Qty: 30 | Fill #0

## 2017-06-28 MED FILL — traZODone HCL 50 MG TABS: 50 | 90 days supply | Qty: 180 | Fill #0

## 2017-06-29 DIAGNOSIS — M75121 Complete rotator cuff tear or rupture of right shoulder, not specified as traumatic: Secondary | ICD-10-CM | POA: Diagnosis not present

## 2017-06-29 DIAGNOSIS — M75102 Unspecified rotator cuff tear or rupture of left shoulder, not specified as traumatic: Secondary | ICD-10-CM | POA: Diagnosis not present

## 2017-06-29 DIAGNOSIS — M7542 Impingement syndrome of left shoulder: Secondary | ICD-10-CM | POA: Diagnosis not present

## 2017-06-29 DIAGNOSIS — M19012 Primary osteoarthritis, left shoulder: Secondary | ICD-10-CM | POA: Diagnosis not present

## 2017-07-04 DIAGNOSIS — M75102 Unspecified rotator cuff tear or rupture of left shoulder, not specified as traumatic: Secondary | ICD-10-CM | POA: Diagnosis not present

## 2017-07-04 DIAGNOSIS — M75121 Complete rotator cuff tear or rupture of right shoulder, not specified as traumatic: Secondary | ICD-10-CM | POA: Diagnosis not present

## 2017-07-04 DIAGNOSIS — M19012 Primary osteoarthritis, left shoulder: Secondary | ICD-10-CM | POA: Diagnosis not present

## 2017-07-04 DIAGNOSIS — M7542 Impingement syndrome of left shoulder: Secondary | ICD-10-CM | POA: Diagnosis not present

## 2017-07-04 MED FILL — SYNTHROID 25 MCG TABLET: 25 | 90 days supply | Qty: 90 | Fill #0

## 2017-07-06 DIAGNOSIS — M19012 Primary osteoarthritis, left shoulder: Secondary | ICD-10-CM | POA: Diagnosis not present

## 2017-07-06 DIAGNOSIS — M75102 Unspecified rotator cuff tear or rupture of left shoulder, not specified as traumatic: Secondary | ICD-10-CM | POA: Diagnosis not present

## 2017-07-06 DIAGNOSIS — M75121 Complete rotator cuff tear or rupture of right shoulder, not specified as traumatic: Secondary | ICD-10-CM | POA: Diagnosis not present

## 2017-07-06 DIAGNOSIS — M7542 Impingement syndrome of left shoulder: Secondary | ICD-10-CM | POA: Diagnosis not present

## 2017-07-11 DIAGNOSIS — M7542 Impingement syndrome of left shoulder: Secondary | ICD-10-CM | POA: Diagnosis not present

## 2017-07-11 DIAGNOSIS — M75121 Complete rotator cuff tear or rupture of right shoulder, not specified as traumatic: Secondary | ICD-10-CM | POA: Diagnosis not present

## 2017-07-11 DIAGNOSIS — M19012 Primary osteoarthritis, left shoulder: Secondary | ICD-10-CM | POA: Diagnosis not present

## 2017-07-11 DIAGNOSIS — M75102 Unspecified rotator cuff tear or rupture of left shoulder, not specified as traumatic: Secondary | ICD-10-CM | POA: Diagnosis not present

## 2017-07-13 DIAGNOSIS — M75121 Complete rotator cuff tear or rupture of right shoulder, not specified as traumatic: Secondary | ICD-10-CM | POA: Diagnosis not present

## 2017-07-13 DIAGNOSIS — M7542 Impingement syndrome of left shoulder: Secondary | ICD-10-CM | POA: Diagnosis not present

## 2017-07-13 DIAGNOSIS — M75102 Unspecified rotator cuff tear or rupture of left shoulder, not specified as traumatic: Secondary | ICD-10-CM | POA: Diagnosis not present

## 2017-07-13 DIAGNOSIS — M19012 Primary osteoarthritis, left shoulder: Secondary | ICD-10-CM | POA: Diagnosis not present

## 2017-07-18 DIAGNOSIS — M7542 Impingement syndrome of left shoulder: Secondary | ICD-10-CM | POA: Diagnosis not present

## 2017-07-18 DIAGNOSIS — M75121 Complete rotator cuff tear or rupture of right shoulder, not specified as traumatic: Secondary | ICD-10-CM | POA: Diagnosis not present

## 2017-07-18 DIAGNOSIS — M19012 Primary osteoarthritis, left shoulder: Secondary | ICD-10-CM | POA: Diagnosis not present

## 2017-07-18 DIAGNOSIS — E038 Other specified hypothyroidism: Secondary | ICD-10-CM | POA: Diagnosis not present

## 2017-07-18 DIAGNOSIS — E782 Mixed hyperlipidemia: Secondary | ICD-10-CM | POA: Diagnosis not present

## 2017-07-18 DIAGNOSIS — I10 Essential (primary) hypertension: Secondary | ICD-10-CM | POA: Diagnosis not present

## 2017-07-18 DIAGNOSIS — D509 Iron deficiency anemia, unspecified: Secondary | ICD-10-CM | POA: Diagnosis not present

## 2017-07-18 DIAGNOSIS — M75102 Unspecified rotator cuff tear or rupture of left shoulder, not specified as traumatic: Secondary | ICD-10-CM | POA: Diagnosis not present

## 2017-07-19 DIAGNOSIS — N952 Postmenopausal atrophic vaginitis: Secondary | ICD-10-CM | POA: Diagnosis not present

## 2017-07-19 DIAGNOSIS — R6882 Decreased libido: Secondary | ICD-10-CM | POA: Diagnosis not present

## 2017-07-19 DIAGNOSIS — N907 Vulvar cyst: Secondary | ICD-10-CM | POA: Diagnosis not present

## 2017-07-20 DIAGNOSIS — M7542 Impingement syndrome of left shoulder: Secondary | ICD-10-CM | POA: Diagnosis not present

## 2017-07-20 DIAGNOSIS — M75102 Unspecified rotator cuff tear or rupture of left shoulder, not specified as traumatic: Secondary | ICD-10-CM | POA: Diagnosis not present

## 2017-07-20 DIAGNOSIS — M75121 Complete rotator cuff tear or rupture of right shoulder, not specified as traumatic: Secondary | ICD-10-CM | POA: Diagnosis not present

## 2017-07-20 DIAGNOSIS — M19012 Primary osteoarthritis, left shoulder: Secondary | ICD-10-CM | POA: Diagnosis not present

## 2017-07-24 DIAGNOSIS — I10 Essential (primary) hypertension: Secondary | ICD-10-CM | POA: Diagnosis not present

## 2017-07-24 DIAGNOSIS — E782 Mixed hyperlipidemia: Secondary | ICD-10-CM | POA: Diagnosis not present

## 2017-07-24 DIAGNOSIS — N951 Menopausal and female climacteric states: Secondary | ICD-10-CM | POA: Diagnosis not present

## 2017-07-24 DIAGNOSIS — M159 Polyosteoarthritis, unspecified: Secondary | ICD-10-CM | POA: Diagnosis not present

## 2017-07-24 DIAGNOSIS — M797 Fibromyalgia: Secondary | ICD-10-CM | POA: Diagnosis not present

## 2017-07-24 DIAGNOSIS — K219 Gastro-esophageal reflux disease without esophagitis: Secondary | ICD-10-CM | POA: Diagnosis not present

## 2017-07-24 DIAGNOSIS — E039 Hypothyroidism, unspecified: Secondary | ICD-10-CM | POA: Diagnosis not present

## 2017-07-24 DIAGNOSIS — R7302 Impaired glucose tolerance (oral): Secondary | ICD-10-CM | POA: Diagnosis not present

## 2017-07-24 DIAGNOSIS — E559 Vitamin D deficiency, unspecified: Secondary | ICD-10-CM | POA: Diagnosis not present

## 2017-07-24 MED FILL — ACCU-CHEK GUIDE STRP: 90 days supply | Qty: 200 | Fill #0

## 2017-07-24 MED FILL — FENOFIBRIC ACID 135 MG CPDR: 135 | 90 days supply | Qty: 90 | Fill #0

## 2017-07-24 MED FILL — ACCU-CHEK FASTCLIX LANCETS: 90 days supply | Qty: 204 | Fill #0

## 2017-07-25 DIAGNOSIS — S46111D Strain of muscle, fascia and tendon of long head of biceps, right arm, subsequent encounter: Secondary | ICD-10-CM | POA: Diagnosis not present

## 2017-07-27 DIAGNOSIS — M7542 Impingement syndrome of left shoulder: Secondary | ICD-10-CM | POA: Diagnosis not present

## 2017-07-27 DIAGNOSIS — M75102 Unspecified rotator cuff tear or rupture of left shoulder, not specified as traumatic: Secondary | ICD-10-CM | POA: Diagnosis not present

## 2017-07-27 DIAGNOSIS — M75121 Complete rotator cuff tear or rupture of right shoulder, not specified as traumatic: Secondary | ICD-10-CM | POA: Diagnosis not present

## 2017-07-27 DIAGNOSIS — M19012 Primary osteoarthritis, left shoulder: Secondary | ICD-10-CM | POA: Diagnosis not present

## 2017-07-27 MED FILL — DAYTRANA 15 MG/9 HR PATCH: 15 | 30 days supply | Qty: 30 | Fill #0

## 2017-08-01 DIAGNOSIS — M75121 Complete rotator cuff tear or rupture of right shoulder, not specified as traumatic: Secondary | ICD-10-CM | POA: Diagnosis not present

## 2017-08-01 DIAGNOSIS — M7542 Impingement syndrome of left shoulder: Secondary | ICD-10-CM | POA: Diagnosis not present

## 2017-08-01 DIAGNOSIS — M75102 Unspecified rotator cuff tear or rupture of left shoulder, not specified as traumatic: Secondary | ICD-10-CM | POA: Diagnosis not present

## 2017-08-01 DIAGNOSIS — M19012 Primary osteoarthritis, left shoulder: Secondary | ICD-10-CM | POA: Diagnosis not present

## 2017-08-03 DIAGNOSIS — M75121 Complete rotator cuff tear or rupture of right shoulder, not specified as traumatic: Secondary | ICD-10-CM | POA: Diagnosis not present

## 2017-08-03 DIAGNOSIS — M19012 Primary osteoarthritis, left shoulder: Secondary | ICD-10-CM | POA: Diagnosis not present

## 2017-08-03 DIAGNOSIS — M75102 Unspecified rotator cuff tear or rupture of left shoulder, not specified as traumatic: Secondary | ICD-10-CM | POA: Diagnosis not present

## 2017-08-03 DIAGNOSIS — M7542 Impingement syndrome of left shoulder: Secondary | ICD-10-CM | POA: Diagnosis not present

## 2017-08-08 DIAGNOSIS — M7542 Impingement syndrome of left shoulder: Secondary | ICD-10-CM | POA: Diagnosis not present

## 2017-08-08 DIAGNOSIS — M75121 Complete rotator cuff tear or rupture of right shoulder, not specified as traumatic: Secondary | ICD-10-CM | POA: Diagnosis not present

## 2017-08-08 DIAGNOSIS — M75102 Unspecified rotator cuff tear or rupture of left shoulder, not specified as traumatic: Secondary | ICD-10-CM | POA: Diagnosis not present

## 2017-08-08 DIAGNOSIS — M19012 Primary osteoarthritis, left shoulder: Secondary | ICD-10-CM | POA: Diagnosis not present

## 2017-08-09 MED FILL — L-METHYLFOLATE-ALGAE 15-90.: 15-90.314 | 30 days supply | Qty: 30 | Fill #1

## 2017-08-09 MED FILL — PRAVASTATIN NA 40 MG TAB: 40 | 90 days supply | Qty: 90 | Fill #2

## 2017-08-10 DIAGNOSIS — M75102 Unspecified rotator cuff tear or rupture of left shoulder, not specified as traumatic: Secondary | ICD-10-CM | POA: Diagnosis not present

## 2017-08-10 DIAGNOSIS — M7542 Impingement syndrome of left shoulder: Secondary | ICD-10-CM | POA: Diagnosis not present

## 2017-08-10 DIAGNOSIS — M19012 Primary osteoarthritis, left shoulder: Secondary | ICD-10-CM | POA: Diagnosis not present

## 2017-08-10 DIAGNOSIS — M75121 Complete rotator cuff tear or rupture of right shoulder, not specified as traumatic: Secondary | ICD-10-CM | POA: Diagnosis not present

## 2017-08-17 DIAGNOSIS — M75121 Complete rotator cuff tear or rupture of right shoulder, not specified as traumatic: Secondary | ICD-10-CM | POA: Diagnosis not present

## 2017-08-17 DIAGNOSIS — M19012 Primary osteoarthritis, left shoulder: Secondary | ICD-10-CM | POA: Diagnosis not present

## 2017-08-17 DIAGNOSIS — M7542 Impingement syndrome of left shoulder: Secondary | ICD-10-CM | POA: Diagnosis not present

## 2017-08-17 DIAGNOSIS — M75102 Unspecified rotator cuff tear or rupture of left shoulder, not specified as traumatic: Secondary | ICD-10-CM | POA: Diagnosis not present

## 2017-08-29 DIAGNOSIS — M7542 Impingement syndrome of left shoulder: Secondary | ICD-10-CM | POA: Diagnosis not present

## 2017-08-29 DIAGNOSIS — M75121 Complete rotator cuff tear or rupture of right shoulder, not specified as traumatic: Secondary | ICD-10-CM | POA: Diagnosis not present

## 2017-08-29 DIAGNOSIS — M75102 Unspecified rotator cuff tear or rupture of left shoulder, not specified as traumatic: Secondary | ICD-10-CM | POA: Diagnosis not present

## 2017-08-29 DIAGNOSIS — M19012 Primary osteoarthritis, left shoulder: Secondary | ICD-10-CM | POA: Diagnosis not present

## 2017-08-30 MED FILL — PREMARIN 0.3 MG TABLET: 0.3 | 90 days supply | Qty: 90 | Fill #1

## 2017-08-31 DIAGNOSIS — S46111D Strain of muscle, fascia and tendon of long head of biceps, right arm, subsequent encounter: Secondary | ICD-10-CM | POA: Diagnosis not present

## 2017-08-31 MED FILL — PREMARIN VAGINAL CREAM-APPL: 0.625 | 90 days supply | Qty: 30 | Fill #0

## 2017-09-05 MED FILL — DAYTRANA 15 MG/9 HR PATCH: 15 | 30 days supply | Qty: 30 | Fill #0

## 2017-09-21 DIAGNOSIS — M19012 Primary osteoarthritis, left shoulder: Secondary | ICD-10-CM | POA: Diagnosis not present

## 2017-09-21 DIAGNOSIS — M75121 Complete rotator cuff tear or rupture of right shoulder, not specified as traumatic: Secondary | ICD-10-CM | POA: Diagnosis not present

## 2017-09-21 DIAGNOSIS — M75102 Unspecified rotator cuff tear or rupture of left shoulder, not specified as traumatic: Secondary | ICD-10-CM | POA: Diagnosis not present

## 2017-09-21 DIAGNOSIS — M7542 Impingement syndrome of left shoulder: Secondary | ICD-10-CM | POA: Diagnosis not present

## 2017-09-27 DIAGNOSIS — M7542 Impingement syndrome of left shoulder: Secondary | ICD-10-CM | POA: Diagnosis not present

## 2017-09-27 DIAGNOSIS — M75102 Unspecified rotator cuff tear or rupture of left shoulder, not specified as traumatic: Secondary | ICD-10-CM | POA: Diagnosis not present

## 2017-09-27 DIAGNOSIS — M75121 Complete rotator cuff tear or rupture of right shoulder, not specified as traumatic: Secondary | ICD-10-CM | POA: Diagnosis not present

## 2017-09-27 DIAGNOSIS — M19012 Primary osteoarthritis, left shoulder: Secondary | ICD-10-CM | POA: Diagnosis not present

## 2017-09-27 DIAGNOSIS — M159 Polyosteoarthritis, unspecified: Secondary | ICD-10-CM | POA: Diagnosis not present

## 2017-10-02 MED FILL — L-METHYLFOLATE-ALGAE 15-90.: 15-90.314 | 30 days supply | Qty: 30 | Fill #2

## 2017-10-05 DIAGNOSIS — M75102 Unspecified rotator cuff tear or rupture of left shoulder, not specified as traumatic: Secondary | ICD-10-CM | POA: Diagnosis not present

## 2017-10-05 DIAGNOSIS — M75121 Complete rotator cuff tear or rupture of right shoulder, not specified as traumatic: Secondary | ICD-10-CM | POA: Diagnosis not present

## 2017-10-05 DIAGNOSIS — M7542 Impingement syndrome of left shoulder: Secondary | ICD-10-CM | POA: Diagnosis not present

## 2017-10-05 DIAGNOSIS — M19012 Primary osteoarthritis, left shoulder: Secondary | ICD-10-CM | POA: Diagnosis not present

## 2017-10-06 MED FILL — DAYTRANA 15 MG/9 HR PATCH: 15 | 30 days supply | Qty: 30 | Fill #0

## 2017-10-23 DIAGNOSIS — G894 Chronic pain syndrome: Secondary | ICD-10-CM | POA: Diagnosis not present

## 2017-10-23 DIAGNOSIS — F900 Attention-deficit hyperactivity disorder, predominantly inattentive type: Secondary | ICD-10-CM | POA: Diagnosis not present

## 2017-10-23 MED FILL — traZODone HCL 50 MG TABS: 50 | 30 days supply | Qty: 60 | Fill #0

## 2017-10-23 MED FILL — L-METHYLFOLATE-ALGAE 15-90.: 15-90.314 | 30 days supply | Qty: 30 | Fill #0

## 2017-10-23 MED FILL — DAYTRANA 10 MG/9 HR PATCH: 10 | 30 days supply | Qty: 30 | Fill #0

## 2017-10-25 DIAGNOSIS — H52203 Unspecified astigmatism, bilateral: Secondary | ICD-10-CM | POA: Diagnosis not present

## 2017-10-25 MED FILL — RESTASIS 0.05% EYE EMULSION: 0.05 | 90 days supply | Qty: 180 | Fill #1

## 2017-10-25 MED FILL — SYNTHROID 25 MCG TABLET: 25 | 90 days supply | Qty: 90 | Fill #1

## 2017-11-13 DIAGNOSIS — N951 Menopausal and female climacteric states: Secondary | ICD-10-CM | POA: Diagnosis not present

## 2017-11-13 DIAGNOSIS — I10 Essential (primary) hypertension: Secondary | ICD-10-CM | POA: Diagnosis not present

## 2017-11-13 DIAGNOSIS — M797 Fibromyalgia: Secondary | ICD-10-CM | POA: Diagnosis not present

## 2017-11-13 DIAGNOSIS — E559 Vitamin D deficiency, unspecified: Secondary | ICD-10-CM | POA: Diagnosis not present

## 2017-11-13 DIAGNOSIS — K219 Gastro-esophageal reflux disease without esophagitis: Secondary | ICD-10-CM | POA: Diagnosis not present

## 2017-11-13 DIAGNOSIS — E039 Hypothyroidism, unspecified: Secondary | ICD-10-CM | POA: Diagnosis not present

## 2017-11-13 DIAGNOSIS — R7302 Impaired glucose tolerance (oral): Secondary | ICD-10-CM | POA: Diagnosis not present

## 2017-11-13 DIAGNOSIS — E782 Mixed hyperlipidemia: Secondary | ICD-10-CM | POA: Diagnosis not present

## 2017-11-13 DIAGNOSIS — R5382 Chronic fatigue, unspecified: Secondary | ICD-10-CM | POA: Diagnosis not present

## 2017-11-13 MED FILL — MELOXICAM 7.5 MG TABLET: 7.5 | 30 days supply | Qty: 30 | Fill #0

## 2017-11-13 MED FILL — VENTOLIN HFA 90 MCG INHALER: 108 (90 BAS | 17 days supply | Qty: 18 | Fill #0

## 2017-11-13 MED FILL — DAYTRANA 15 MG/9 HR PATCH: 15 | 60 days supply | Qty: 60 | Fill #0

## 2017-11-13 MED FILL — EPINEPHRINE 0.3 MG AUTO-INJ: 0.3 | 30 days supply | Qty: 2 | Fill #0

## 2017-11-14 MED FILL — HYDROCORT-IODOQUINOL-ALOE S: 1-1.9 | 30 days supply | Qty: 60 | Fill #0

## 2017-11-22 MED FILL — DAYTRANA 10 MG/9 HR PATCH: 10 | 60 days supply | Qty: 60 | Fill #0

## 2017-11-23 DIAGNOSIS — Z96651 Presence of right artificial knee joint: Secondary | ICD-10-CM | POA: Diagnosis not present

## 2017-11-23 DIAGNOSIS — M1712 Unilateral primary osteoarthritis, left knee: Secondary | ICD-10-CM | POA: Diagnosis not present

## 2017-11-27 MED FILL — PRAVASTATIN NA 40 MG TAB: 40 | 90 days supply | Qty: 90 | Fill #3

## 2017-11-27 MED FILL — PREMARIN 0.3 MG TABLET: 0.3 | 90 days supply | Qty: 90 | Fill #2

## 2017-11-27 MED FILL — traZODone HCL 50 MG TABS: 50 | 90 days supply | Qty: 180 | Fill #1

## 2017-11-27 MED FILL — FENOFIBRIC ACID 135 MG CPDR: 135 | 90 days supply | Qty: 90 | Fill #1

## 2017-11-29 DIAGNOSIS — M75102 Unspecified rotator cuff tear or rupture of left shoulder, not specified as traumatic: Secondary | ICD-10-CM | POA: Diagnosis not present

## 2017-11-29 DIAGNOSIS — M7542 Impingement syndrome of left shoulder: Secondary | ICD-10-CM | POA: Diagnosis not present

## 2017-11-29 DIAGNOSIS — M75121 Complete rotator cuff tear or rupture of right shoulder, not specified as traumatic: Secondary | ICD-10-CM | POA: Diagnosis not present

## 2017-11-29 DIAGNOSIS — M19012 Primary osteoarthritis, left shoulder: Secondary | ICD-10-CM | POA: Diagnosis not present

## 2017-11-29 MED FILL — PREMARIN VAGINAL CREAM-APPL: 0.625 | 90 days supply | Qty: 30 | Fill #0

## 2017-11-29 MED FILL — DICLOFENAC SODIUM 1% GEL: 1 | 19 days supply | Qty: 300 | Fill #0

## 2018-01-02 MED FILL — VENTOLIN HFA 90 MCG INHALER: 108 (90 BAS | 17 days supply | Qty: 18 | Fill #1

## 2018-01-18 DIAGNOSIS — L820 Inflamed seborrheic keratosis: Secondary | ICD-10-CM | POA: Diagnosis not present

## 2018-01-18 DIAGNOSIS — L814 Other melanin hyperpigmentation: Secondary | ICD-10-CM | POA: Diagnosis not present

## 2018-01-18 DIAGNOSIS — L308 Other specified dermatitis: Secondary | ICD-10-CM | POA: Diagnosis not present

## 2018-01-18 DIAGNOSIS — L821 Other seborrheic keratosis: Secondary | ICD-10-CM | POA: Diagnosis not present

## 2018-01-18 MED FILL — HYDROCORT-IODOQUINOL-ALOE S: 1-1.9 | 30 days supply | Qty: 60 | Fill #0

## 2018-01-19 MED FILL — DAYTRANA 15 MG/9 HR PATCH: 15 | 30 days supply | Qty: 30 | Fill #0

## 2018-01-25 DIAGNOSIS — G894 Chronic pain syndrome: Secondary | ICD-10-CM | POA: Diagnosis not present

## 2018-01-25 DIAGNOSIS — F900 Attention-deficit hyperactivity disorder, predominantly inattentive type: Secondary | ICD-10-CM | POA: Diagnosis not present

## 2018-01-25 MED FILL — L-METHYLFOLATE-ALGAE 15-90.: 15-90.314 | 30 days supply | Qty: 30 | Fill #0

## 2018-01-25 MED FILL — DAYTRANA 10 MG/9 HR PATCH: 10 | 30 days supply | Qty: 30 | Fill #0

## 2018-01-29 ENCOUNTER — Ambulatory Visit
Admission: RE | Admit: 2018-01-29 | Discharge: 2018-01-29 | Disposition: A | Discharge status: Home / Self Care | Payer: 59 | Source: Ambulatory Visit | Attending: Obstetrics and Gynecology | Admitting: Obstetrics and Gynecology

## 2018-01-29 ENCOUNTER — Other Ambulatory Visit: Payer: Self-pay | Admitting: Obstetrics and Gynecology

## 2018-01-29 DIAGNOSIS — R7302 Impaired glucose tolerance (oral): Secondary | ICD-10-CM | POA: Diagnosis not present

## 2018-01-29 DIAGNOSIS — M79671 Pain in right foot: Secondary | ICD-10-CM | POA: Diagnosis not present

## 2018-01-29 DIAGNOSIS — Z1231 Encounter for screening mammogram for malignant neoplasm of breast: Secondary | ICD-10-CM

## 2018-01-29 DIAGNOSIS — R300 Dysuria: Secondary | ICD-10-CM | POA: Diagnosis not present

## 2018-01-29 DIAGNOSIS — E038 Other specified hypothyroidism: Secondary | ICD-10-CM | POA: Diagnosis not present

## 2018-01-29 DIAGNOSIS — E782 Mixed hyperlipidemia: Secondary | ICD-10-CM | POA: Diagnosis not present

## 2018-01-29 DIAGNOSIS — D509 Iron deficiency anemia, unspecified: Secondary | ICD-10-CM | POA: Diagnosis not present

## 2018-01-29 DIAGNOSIS — I10 Essential (primary) hypertension: Secondary | ICD-10-CM | POA: Diagnosis not present

## 2018-01-29 DIAGNOSIS — E559 Vitamin D deficiency, unspecified: Secondary | ICD-10-CM | POA: Diagnosis not present

## 2018-01-29 MED FILL — SYNTHROID 25 MCG TABLET: 25 | 10 days supply | Qty: 10 | Fill #2

## 2018-01-30 ENCOUNTER — Other Ambulatory Visit: Payer: Self-pay | Admitting: Obstetrics and Gynecology

## 2018-01-30 DIAGNOSIS — R928 Other abnormal and inconclusive findings on diagnostic imaging of breast: Secondary | ICD-10-CM

## 2018-02-02 ENCOUNTER — Ambulatory Visit: Payer: Self-pay

## 2018-02-02 ENCOUNTER — Ambulatory Visit
Admission: RE | Admit: 2018-02-02 | Discharge: 2018-02-02 | Disposition: A | Discharge status: Home / Self Care | Payer: 59 | Source: Ambulatory Visit | Attending: Obstetrics and Gynecology | Admitting: Obstetrics and Gynecology

## 2018-02-02 ENCOUNTER — Other Ambulatory Visit: Payer: Self-pay

## 2018-02-02 DIAGNOSIS — E039 Hypothyroidism, unspecified: Secondary | ICD-10-CM | POA: Diagnosis not present

## 2018-02-02 DIAGNOSIS — R7302 Impaired glucose tolerance (oral): Secondary | ICD-10-CM | POA: Diagnosis not present

## 2018-02-02 DIAGNOSIS — N951 Menopausal and female climacteric states: Secondary | ICD-10-CM | POA: Diagnosis not present

## 2018-02-02 DIAGNOSIS — E782 Mixed hyperlipidemia: Secondary | ICD-10-CM | POA: Diagnosis not present

## 2018-02-02 DIAGNOSIS — I10 Essential (primary) hypertension: Secondary | ICD-10-CM | POA: Diagnosis not present

## 2018-02-02 DIAGNOSIS — E559 Vitamin D deficiency, unspecified: Secondary | ICD-10-CM | POA: Diagnosis not present

## 2018-02-02 DIAGNOSIS — M797 Fibromyalgia: Secondary | ICD-10-CM | POA: Diagnosis not present

## 2018-02-02 DIAGNOSIS — R922 Inconclusive mammogram: Secondary | ICD-10-CM | POA: Diagnosis not present

## 2018-02-02 DIAGNOSIS — R928 Other abnormal and inconclusive findings on diagnostic imaging of breast: Secondary | ICD-10-CM

## 2018-02-02 DIAGNOSIS — M159 Polyosteoarthritis, unspecified: Secondary | ICD-10-CM | POA: Diagnosis not present

## 2018-02-02 DIAGNOSIS — K219 Gastro-esophageal reflux disease without esophagitis: Secondary | ICD-10-CM | POA: Diagnosis not present

## 2018-02-02 MED FILL — ACCU-CHEK GUIDE STRP: 90 days supply | Qty: 200 | Fill #1

## 2018-02-05 MED FILL — SYNTHROID 25 MCG TABLET: 25 | 90 days supply | Qty: 135 | Fill #0

## 2018-03-06 MED FILL — DAYTRANA 15 MG/9 HR PATCH: 15 | 30 days supply | Qty: 30 | Fill #0

## 2018-03-06 MED FILL — L-METHYLFOLATE-ALGAE 15-90.: 15-90.314 | 30 days supply | Qty: 30 | Fill #1

## 2018-03-06 MED FILL — PREMARIN 0.3 MG TABLET: 0.3 | 90 days supply | Qty: 90 | Fill #3

## 2018-03-06 MED FILL — FENOFIBRIC ACID 135 MG CPDR: 135 | 90 days supply | Qty: 90 | Fill #2

## 2018-03-07 MED FILL — PRAVASTATIN NA 40 MG TAB: 40 | 90 days supply | Qty: 90 | Fill #0

## 2018-03-14 DIAGNOSIS — R6882 Decreased libido: Secondary | ICD-10-CM | POA: Diagnosis not present

## 2018-03-14 DIAGNOSIS — N951 Menopausal and female climacteric states: Secondary | ICD-10-CM | POA: Diagnosis not present

## 2018-03-14 DIAGNOSIS — Z01419 Encounter for gynecological examination (general) (routine) without abnormal findings: Secondary | ICD-10-CM | POA: Diagnosis not present

## 2018-03-14 DIAGNOSIS — N94819 Vulvodynia, unspecified: Secondary | ICD-10-CM | POA: Diagnosis not present

## 2018-03-14 DIAGNOSIS — N907 Vulvar cyst: Secondary | ICD-10-CM | POA: Diagnosis not present

## 2018-03-14 MED FILL — PREMARIN 0.45 MG TABLET: 0.45 | 90 days supply | Qty: 90 | Fill #0

## 2018-04-12 MED FILL — DAYTRANA 15 MG/9 HR PATCH: 15 | 30 days supply | Qty: 30 | Fill #0

## 2018-04-12 MED FILL — L-METHYLFOLATE-ALGAE 15-90.: 15-90.314 | 30 days supply | Qty: 30 | Fill #2

## 2018-05-09 MED FILL — SYNTHROID 25 MCG TABLET: 25 | 90 days supply | Qty: 135 | Fill #1

## 2018-05-10 MED FILL — DAYTRANA 15 MG/9 HR PATCH: 15 | 30 days supply | Qty: 30 | Fill #0

## 2018-05-21 DIAGNOSIS — M19011 Primary osteoarthritis, right shoulder: Secondary | ICD-10-CM | POA: Diagnosis not present

## 2018-05-21 DIAGNOSIS — M19012 Primary osteoarthritis, left shoulder: Secondary | ICD-10-CM | POA: Diagnosis not present

## 2018-05-21 DIAGNOSIS — M1712 Unilateral primary osteoarthritis, left knee: Secondary | ICD-10-CM | POA: Diagnosis not present

## 2018-05-21 DIAGNOSIS — Z96651 Presence of right artificial knee joint: Secondary | ICD-10-CM | POA: Diagnosis not present

## 2018-05-30 DIAGNOSIS — F900 Attention-deficit hyperactivity disorder, predominantly inattentive type: Secondary | ICD-10-CM | POA: Diagnosis not present

## 2018-05-30 DIAGNOSIS — G894 Chronic pain syndrome: Secondary | ICD-10-CM | POA: Diagnosis not present

## 2018-05-30 MED FILL — L-METHYLFOLATE-ALGAE 15-90.: 15-90.314 | 30 days supply | Qty: 30 | Fill #0

## 2018-05-30 MED FILL — DAYTRANA 10 MG/9 HR PATCH: 10 | 30 days supply | Qty: 30 | Fill #0

## 2018-05-30 MED FILL — LITHIUM CARBONATE 150 MG CA: 150 | 30 days supply | Qty: 30 | Fill #0

## 2018-06-04 MED FILL — PRAVASTATIN NA 40 MG TAB: 40 | 90 days supply | Qty: 90 | Fill #1

## 2018-06-05 DIAGNOSIS — M79671 Pain in right foot: Secondary | ICD-10-CM | POA: Diagnosis not present

## 2018-06-05 DIAGNOSIS — M26629 Arthralgia of temporomandibular joint, unspecified side: Secondary | ICD-10-CM | POA: Diagnosis not present

## 2018-06-05 DIAGNOSIS — E782 Mixed hyperlipidemia: Secondary | ICD-10-CM | POA: Diagnosis not present

## 2018-06-05 DIAGNOSIS — E039 Hypothyroidism, unspecified: Secondary | ICD-10-CM | POA: Diagnosis not present

## 2018-06-05 DIAGNOSIS — M159 Polyosteoarthritis, unspecified: Secondary | ICD-10-CM | POA: Diagnosis not present

## 2018-06-05 DIAGNOSIS — M797 Fibromyalgia: Secondary | ICD-10-CM | POA: Diagnosis not present

## 2018-06-05 DIAGNOSIS — E038 Other specified hypothyroidism: Secondary | ICD-10-CM | POA: Diagnosis not present

## 2018-06-05 DIAGNOSIS — I10 Essential (primary) hypertension: Secondary | ICD-10-CM | POA: Diagnosis not present

## 2018-06-05 DIAGNOSIS — D509 Iron deficiency anemia, unspecified: Secondary | ICD-10-CM | POA: Diagnosis not present

## 2018-06-05 DIAGNOSIS — E559 Vitamin D deficiency, unspecified: Secondary | ICD-10-CM | POA: Diagnosis not present

## 2018-06-05 DIAGNOSIS — K219 Gastro-esophageal reflux disease without esophagitis: Secondary | ICD-10-CM | POA: Diagnosis not present

## 2018-06-05 DIAGNOSIS — N951 Menopausal and female climacteric states: Secondary | ICD-10-CM | POA: Diagnosis not present

## 2018-06-05 DIAGNOSIS — R7302 Impaired glucose tolerance (oral): Secondary | ICD-10-CM | POA: Diagnosis not present

## 2018-06-05 DIAGNOSIS — R300 Dysuria: Secondary | ICD-10-CM | POA: Diagnosis not present

## 2018-06-22 MED FILL — PREMARIN 0.45 MG TABLET: 0.45 | 90 days supply | Qty: 90 | Fill #1

## 2018-06-22 MED FILL — DAYTRANA 15 MG/9 HR PATCH: 15 | 30 days supply | Qty: 30 | Fill #0

## 2018-06-22 MED FILL — FENOFIBRIC ACID 135 MG CPDR: 135 | 90 days supply | Qty: 90 | Fill #3

## 2018-06-29 MED FILL — L-METHYLFOLATE-ALGAE 15-90.: 15-90.314 | 30 days supply | Qty: 30 | Fill #1 | Status: TO

## 2018-06-29 MED FILL — ACCU-CHEK GUIDE STRP: 90 days supply | Qty: 200 | Fill #2

## 2018-06-29 MED FILL — ACCU-CHEK FASTCLIX LANCETS: 90 days supply | Qty: 204 | Fill #1

## 2018-07-18 MED FILL — DAYTRANA 10 MG/9 HR PATCH: 10 | 30 days supply | Qty: 30 | Fill #0

## 2018-07-18 MED FILL — LITHIUM CARBONATE 150 MG CA: 150 | 90 days supply | Qty: 90 | Fill #0

## 2018-07-18 MED FILL — ESCITALOPRAM 10 MG TABLET: 10 | 90 days supply | Qty: 180 | Fill #0

## 2018-07-18 MED FILL — traZODone HCL 50 MG TABS: 50 | 90 days supply | Qty: 180 | Fill #0

## 2018-07-19 MED FILL — ALBUTEROL SULFATE HFA 108 (: 108 (90 BAS | 16 days supply | Qty: 9 | Fill #0

## 2018-07-19 MED FILL — RESTASIS 0.05% EYE EMULSION: 0.05 | 90 days supply | Qty: 180 | Fill #0

## 2018-07-19 MED FILL — SYNTHROID 25 MCG TABLET: 25 | 90 days supply | Qty: 135 | Fill #0

## 2018-07-19 MED FILL — L-METHYLFOLATE-ALGAE 15-90.: 15-90.314 | 30 days supply | Qty: 30 | Fill #0

## 2018-07-21 MED FILL — DAYTRANA 15 MG/9 HR PATCH: 15 | 30 days supply | Qty: 30 | Fill #0

## 2018-08-14 ENCOUNTER — Telehealth: Payer: Self-pay | Admitting: Neurology

## 2018-08-14 NOTE — Telephone Encounter (Signed)
Due to current COVID 19 pandemic, our office is severely reducing in office visits until further notice, in order to minimize the risk to our patients and healthcare providers.   Called patient and offered virtual visit for 5/11 appointment. Patient accepted and verbalized understanding of the doxy.me process. Patient understands that she will receive an e-mail with directions and well as Dr. Edwena Felty link. Patient is aware that she will receive call from RN and front office staff.  Pt understands that although there may be some limitations with this type of visit, we will take all precautions to reduce any security or privacy concerns.  Pt understands that this will be treated like an in office visit and we will file with pt's insurance, and there may be a patient responsible charge related to this service.

## 2018-08-16 ENCOUNTER — Encounter: Payer: Self-pay | Admitting: Neurology

## 2018-08-16 NOTE — Telephone Encounter (Signed)

## 2018-08-16 NOTE — Addendum Note (Signed)
Addended by: Darleen Crocker on: 08/16/2018 02:10 PM   Modules accepted: Orders

## 2018-08-20 ENCOUNTER — Ambulatory Visit (INDEPENDENT_AMBULATORY_CARE_PROVIDER_SITE_OTHER): Payer: 59 | Admitting: Neurology

## 2018-08-20 ENCOUNTER — Other Ambulatory Visit: Payer: Self-pay

## 2018-08-20 DIAGNOSIS — R42 Dizziness and giddiness: Secondary | ICD-10-CM | POA: Diagnosis not present

## 2018-08-20 DIAGNOSIS — R29898 Other symptoms and signs involving the musculoskeletal system: Secondary | ICD-10-CM | POA: Diagnosis not present

## 2018-08-20 DIAGNOSIS — E46 Unspecified protein-calorie malnutrition: Secondary | ICD-10-CM | POA: Diagnosis not present

## 2018-08-20 DIAGNOSIS — E11649 Type 2 diabetes mellitus with hypoglycemia without coma: Secondary | ICD-10-CM

## 2018-08-20 DIAGNOSIS — R5383 Other fatigue: Secondary | ICD-10-CM

## 2018-08-20 DIAGNOSIS — H53483 Generalized contraction of visual field, bilateral: Secondary | ICD-10-CM | POA: Diagnosis not present

## 2018-08-20 DIAGNOSIS — H814 Vertigo of central origin: Secondary | ICD-10-CM | POA: Diagnosis not present

## 2018-08-20 DIAGNOSIS — E8809 Other disorders of plasma-protein metabolism, not elsewhere classified: Secondary | ICD-10-CM

## 2018-08-20 DIAGNOSIS — H8190 Unspecified disorder of vestibular function, unspecified ear: Secondary | ICD-10-CM

## 2018-08-20 NOTE — Progress Notes (Signed)
Virtual Visit via Video Note  I connected with Rita Gregory on 08/20/18 at  2:00 PM EDT by a video enabled telemedicine application and verified that I am speaking with the correct person using two identifiers.  Location: Patient: at home  Provider: at home office   I discussed the limitations of evaluation and management by telemedicine and the availability of in person appointments. The patient expressed understanding and agreed to proceed.  History of Present Illness:  The patient reports having a variety of spells, that she was able to cluster in 4 different spell groups.   1) #1 in 2019 in the heat of summer the patient went to [place] to visit the [place] with family.  There she hit her head while alighted from the car, on the vertex of her skull.  She reports feeling as if if there was sand running from the vertex down to the spine.  This lasted for a while and she felt increasingly weak but she continued to visit is due and was apparently able to ambulate.  Her husband had not noted her to be pale or in any way confused she had regular pulses she reports that she felt peripheral vision loss accompanied this weakness.  2) spell #2 is a sensation of increased pressure in her head that occurs nearly daily between 3 and 6 PM she likens the sensation to a feeling of low blood sugar or hypoglycemia but heart rate and blood pressure are normal.  She reports feeling better when she eats or drinks something.  3) spell #3 occurs when she looks up turns her head towards the ceiling and is associated with blurred vision peripheral vision loss a shrinking of the visual field she feels weak unsteady and it occurs when she focuses on a task as well.  This has been present for about 18 months and she reports that her gait has been changed while she has such a spell.  She reportedly "walks like a duck".  Her blood sugars are normal during this time.  4) spell #4 is described as a feeling as  if pulled forward or backward a sensation of being in motion while not moving a kind of non-rotational vertigo with propulsive and retropulsive tendencies.  There has been no fall.        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 Elyse Hsu for evaluation of spells.   I have seen Mrs. Olds in the past she has a history of fibromyalgia, adult ADHD, now over the last 4 years there have been some changes she became prediabetic, she had a back surgery in 2016 on the lumbar spine performed by Dr. Kristeen Miss.  She reports the above-named type of spells is a fairly new finding.  She carries also a diagnosis of mixed dyslipidemia, hypertension, primary hypothyroidism vitamin D deficiency, GERD without esophagitis, menopausal symptoms, osteoarthritis, chronic fatigue and chronic pain.  She reports chronic left shoulder pain and bunion pain in her feet but continues to manage these conservatively.  She was found to have a patent foramen ovale at [facility] cardiology clinic and has followed with Dr. Daneen Schick, MD.  HbA1c has increased from 5.7 in 2008-6.1 in October 2019.  Review of Systems: Out  of a complete 14 system review, the patient complains of only the following symptoms, and all other reviewed systems are negative.  Spells as above   Social History   Socioeconomic History  . Marital status: Married    Spouse name: Legrand Como  . Number of children: Not on file  . Years of education: Not on file  . Highest education level: Not on file  Occupational History  . Occupation: Facilities manager: [employer]  . Financial resource strain: Not on file  . Food insecurity:    Worry: Not on file    Inability: Not on file  . Transportation  needs:    Medical: Not on file    Non-medical: Not on file  Tobacco Use  . Smoking status: Passive Smoke Exposure - Never Smoker  . Smokeless tobacco: Never Used  . Tobacco comment: Flight attentent for 7 years. [id number] Passive smoker  Substance and Sexual Activity  . Alcohol use: Yes    Comment: Very rarely   . Drug use: No  . Sexual activity: Yes    Partners: Male  Lifestyle  . Physical activity:    Days per week: Not on file    Minutes per session: Not on file  . Stress: Not on file  Relationships  . Social connections:    Talks on phone: Not on file    Gets together: Not on file    Attends religious service: Not on file    Active member of club or organization: Not on file    Attends meetings of clubs or organizations: Not on file    Relationship status: Not on file  . Intimate partner violence:    Fear of current or ex partner: Not on file    Emotionally abused: Not on file    Physically abused: Not on file    Forced sexual activity: Not on file  Other Topics Concern  . Not on file  Social History Narrative   Husband is Dr. Tillman Sers     Family History  Problem Relation Age of Onset  . Ovarian cancer Mother        passed at age 62  . Heart disease Father   . Kidney disease Father        Kidney cancer  . Diabetes type II Father        passed at 65 yrs old  . Colon cancer Father        cancereous polyps  . Stomach cancer Paternal Uncle   . Bipolar disorder Sister        Osteopenia  . Bipolar disorder Son     Past Medical History:  Diagnosis Date  . Arthritis of left acromioclavicular joint   . Cervical disc disorder with left arm radiculopathy of cervical region 04/20/2017  . Chronic pain    Dr. Ellene Route follows  . Colon polyp 11/09/2007   Hyperplastic   . Depression   . Diverticulosis of colon (without mention of hemorrhage) 11/09/2007  . Esophageal reflux   . Fibromyalgia   . Hypertension    in the past  . Hypothyroidism   .  Kyphoscoliosis    40 degree bend ro right side  . Paroxysmal spells 02/13/2013  . Patent foramen ovale    hx.- Dr. Lemmie Evens. Smith,cardiology- follows  . Pre-diabetes    Controls with diet  . Rotator cuff tear, non-traumatic, left   . Shoulder impingement, left     Past Surgical History:  Procedure Laterality Date  . ABDOMINAL HYSTERECTOMY    . ANTERIOR LATERAL LUMBAR FUSION 4 LEVELS Left 06/16/2015   Procedure: ANTERIOR LATERAL LUMBAR FUSION THORACIC TWELVE-LUMBAR FOUR;  Surgeon: Kristeen Miss, MD;  Location: [facility];  Service: Neurosurgery;  Laterality: Left;  Thoracolumbar spine  . APPENDECTOMY    . CESAREAN SECTION     x2  . CHOLECYSTECTOMY    . dry eyes     uses Restasis daily  . ESOPHAGOGASTRODUODENOSCOPY  02/23/2012   Procedure: ESOPHAGOGASTRODUODENOSCOPY (EGD);  Surgeon: Irene Shipper, MD;  Location: [facility];  Service: Endoscopy;  Laterality: N/A;  . JOINT REPLACEMENT     RTKA '05  . KNEE ARTHROSCOPY W/ ACL RECONSTRUCTION     Rt. Knee  . POSTERIOR LUMBAR FUSION 4 LEVEL N/A 06/16/2015   Procedure: POSTERIOR LUMBAR FUSION LUMBAR FOUR-FIVE LUMBAR FIVE-SACRAL ONE;  Surgeon: Kristeen Miss, MD;  Location: [facility];  Service: Neurosurgery;  Laterality: N/A;  . RADIOLOGY WITH ANESTHESIA N/A 06/15/2015   Procedure: RADIOLOGY WITH ANESTHESIA;  Surgeon: Medication Radiologist, MD;  Location: [facility];  Service: Radiology;  Laterality: N/A;  . right rotator cuff  2011   sx  . SHOULDER ARTHROSCOPY Left 04/28/2017   Procedure: ARTHROSCOPY SHOULDER WITH EXTENSIVE DEBRIDEMENT ROTATOR CUFF TEAR;  Surgeon: Elsie Saas, MD;  Location: [facility];  Service: Orthopedics;  Laterality: Left;    Current Outpatient Medications  Medication Sig Dispense Refill  . acetaminophen (TYLENOL) 650 MG CR tablet Take 650 mg by mouth every 8 (eight) hours as needed for pain.    Marland Kitchen albuterol (PROVENTIL HFA;VENTOLIN HFA) 108 (90 BASE) MCG/ACT inhaler Inhale 2 puffs into the lungs every 6 (six)  hours as needed. For shortness of breath    . carboxymethylcellulose (REFRESH PLUS) 0.5 % SOLN 1 drop daily as needed.    . Choline Fenofibrate (TRILIPIX) 135 MG capsule Take 135 mg by mouth at bedtime.    . conjugated estrogens (PREMARIN) vaginal cream Place vaginally.    . cycloSPORINE (RESTASIS) 0.05 % ophthalmic emulsion Place 1 drop into both eyes 2 (two) times daily.    Marland Kitchen EPINEPHrine (EPIPEN 2-PAK) 0.3 mg/0.3 mL DEVI Inject 0.3 mg into the muscle once. To medications, touch, or inhaltions    . escitalopram (LEXAPRO) 10 MG tablet Take 10 mg by mouth 2 (two) times daily.    Marland Kitchen esomeprazole (NEXIUM) 20 MG packet Take 20 mg by mouth at bedtime.    Marland Kitchen estrogens, conjugated, (PREMARIN) 0.45 MG tablet Take 0.45 mg by mouth daily.     Marland Kitchen ketoconazole (NIZORAL) 2 % cream Apply 1 application topically 2 (two) times daily as needed for irritation.     Marland Kitchen L-Methylfolate-Algae (DEPLIN 15) 15-90.314 MG CAPS Take 1 capsule by mouth daily.  1  . lansoprazole (PREVACID) 30 MG capsule Take 30 mg by mouth 2 (two) times daily before a meal.    . levothyroxine (SYNTHROID, LEVOTHROID) 25 MCG tablet Take 25 mcg by mouth at bedtime.    . meclizine (ANTIVERT) 25 MG tablet Take 25 mg by mouth 3 (three) times daily as needed for dizziness.    . meloxicam (MOBIC) 15 MG tablet Take 15 mg by mouth daily.    . methylphenidate (DAYTRANA) 10 mg/9hr Place 1 patch onto the skin daily as needed (15mg  daily/10 mg as a prn). Take if extra if needed for ADHD. Wear patch for 9 hours only each day    . methylphenidate (DAYTRANA) 15 mg/9hr Place 1 patch onto the skin daily.  wear patch for 9 hours only each day    . miconazole (MONISTAT 7) 2 % vaginal cream Use as needed    . oxymetazoline (NASAL RELIEF) 0.05 % nasal spray Use 1-2 sprays each nostril every 12 hours as needed, for occasional use only    . Polyvinyl Alcohol-Povidone (REFRESH OP) Apply to eye.    . pravastatin (PRAVACHOL) 40 MG tablet Take 40 mg by mouth at bedtime.    .  pyridOXINE (VITAMIN B-6) 100 MG tablet Take 100 mg by mouth daily.     . silver sulfADIAZINE (SILVADENE) 1 % cream Use as directed as needed for burn    . testosterone (ANDROGEL) 50 MG/5GM (1%) GEL Use as directed 3 times a week as needed    . traZODone (DESYREL) 50 MG tablet Take 50-100 mg by mouth at bedtime.     . Vitamin D, Ergocalciferol, (DRISDOL) 50000 units CAPS capsule Take 50,000 Units by mouth every 7 (seven) days.     No current facility-administered medications for this visit.     Allergies as of 08/20/2018 - Review Complete 08/16/2018  Allergen Reaction Noted  . Betadine [povidone iodine] Other (See Comments) 04/28/2017  . Fentanyl Shortness Of Breath 03/27/2008  . Morphine and related Anaphylaxis 09/26/2011  . Bupropion hcl  03/27/2008  . Codeine Nausea And Vomiting 03/27/2008  . Demerol [meperidine] Nausea And Vomiting 09/26/2011  . Dextromethorphan Other (See Comments) 09/26/2011  . Guaifenesin & derivatives  09/26/2011  . Lipitor [atorvastatin]  09/26/2011  . Lovaza [omega-3-acid ethyl esters] Diarrhea 09/26/2011  . Lyrica [pregabalin]  09/26/2011  . Naltrexone  09/26/2011  . Percodan [oxycodone-aspirin] Nausea And Vomiting 09/26/2011  . Prilosec [omeprazole]  09/26/2011  . Topamax [topiramate]  09/26/2011  . Valium [diazepam]  09/26/2011  . Versed [midazolam]  09/26/2011  . Adhesive [tape] Rash 09/26/2011  . Concerta [methylphenidate] Palpitations 09/26/2011  . Doxycycline Rash 05/06/2016  . Lamotrigine Rash 03/27/2008  . Nickel Rash 09/26/2011  . Oxycodone-acetaminophen Nausea And Vomiting and Rash 03/27/2008  . Percocet [oxycodone-acetaminophen] Nausea And Vomiting and Rash 09/26/2011  . Tegaderm ag mesh [silver] Rash 09/26/2011    Vitals: There were no vitals taken for this visit. Last Weight:  Wt Readings from Last 1 Encounters:  04/28/17 173 lb 6 oz (78.6 kg)   Last Height:   Ht Readings from Last 1 Encounters:  04/28/17 4' 11.5" (1.511 m)     Observations/Objective:  General: The patient is awake, alert and appears not in acute distress. The patient is well groomed. Head: Normocephalic, atraumatic. Neck is supple. ROM intact , Mallampati 3, neck circumference:14".  Patient is able to hold her breath for 20 sec. No SOB during interview.    Neurologic exam : The patient is awake and alert, oriented to place and time.   Memory subjective  described as intact. There is a sufficient attention span & concentration ability. Speech is fluent without dysarthria, dysphonia or aphasia. Mood and affect are appropriate.  Cranial nerves: No changes in her sense of taste or smell.  Pupils are equal  Extraocular movements  in vertical and horizontal planes intact  Facial motor strength is symmetric and tongue and uvula move midline. Tongue protrusion into either cheek is normal. Shoulder shrug is normal.   Motor exam: symmetric ROM  in all extremities, some restriction to shoulder elevation, left more than right. Both shoulders look droopy.   Coordination: Rapid alternating movements  without evidence of ataxia, dysmetria or tremor.  Gait and station: Patient walks currently  without assistive device , her right leg is shorter by 1/4 inch and this may be compensated with a shoe insert.   Asencion Partridge Euline Kimbler MD 08/20/2018    Assessment and Plan: The patient's father suffered from myasthenia gravis,  and she has developed hypoglycemia symptoms, some of her spells have improved after taking food.  There is still a possibility of a MG manifestation in her, the weakness spells, the increased fatigue , but it would not account for the visual field restriction.    I will need to look at MRI and MRA brain , Ach Ab, and a cardiac monitor can be helpful to rule out other causes,  Orthostatic BP and heart rate can be done by PCP and/ or cardiologist. Gait Exam with Dr Doran Durand.      1) MG Ach ab testing,  2) Gait exam ( Dr Doran Durand) 3) In the  meanting MRI and MRA brain and neck.  4) Cardiac monitor with  Dr. Pernell Dupre , MD  RV face to face - 4-6 weeks.* May need Vestibular therapy, will need orthostatics as well.       I discussed the assessment and treatment plan with the patient. The patient was provided an opportunity to ask questions and all were answered. The patient agreed with the plan and demonstrated an understanding of the instructions.   The patient was advised to call back or seek an in-person evaluation if the symptoms worsen or if the condition fails to improve as anticipated.  I provided 28 minutes of non-face-to-face time during this encounter. I also reviewed additional notes by other specialists as long as available in [place], Dr Altheimer's referral and the last noted physical exam. Total time 40 minutes.   Larey Seat, MD   08-20-2018

## 2018-08-22 DIAGNOSIS — G894 Chronic pain syndrome: Secondary | ICD-10-CM | POA: Diagnosis not present

## 2018-08-22 DIAGNOSIS — F411 Generalized anxiety disorder: Secondary | ICD-10-CM | POA: Diagnosis not present

## 2018-08-22 DIAGNOSIS — F900 Attention-deficit hyperactivity disorder, predominantly inattentive type: Secondary | ICD-10-CM | POA: Diagnosis not present

## 2018-08-22 MED FILL — L-METHYLFOLATE-ALGAE 15-90.: 15-90.314 | 30 days supply | Qty: 30 | Fill #0

## 2018-08-22 MED FILL — DAYTRANA 10 MG/9 HR PATCH: 10 | 30 days supply | Qty: 30 | Fill #0

## 2018-08-22 MED FILL — DAYTRANA 15 MG/9 HR PATCH: 15 | 30 days supply | Qty: 30 | Fill #0

## 2018-08-24 ENCOUNTER — Encounter: Payer: Self-pay | Admitting: Neurology

## 2018-08-24 DIAGNOSIS — H8190 Unspecified disorder of vestibular function, unspecified ear: Secondary | ICD-10-CM | POA: Insufficient documentation

## 2018-08-24 DIAGNOSIS — R29898 Other symptoms and signs involving the musculoskeletal system: Secondary | ICD-10-CM | POA: Insufficient documentation

## 2018-08-24 DIAGNOSIS — R42 Dizziness and giddiness: Secondary | ICD-10-CM | POA: Insufficient documentation

## 2018-08-24 DIAGNOSIS — H53483 Generalized contraction of visual field, bilateral: Secondary | ICD-10-CM | POA: Insufficient documentation

## 2018-08-24 NOTE — Patient Instructions (Signed)

## 2018-08-25 NOTE — Progress Notes (Signed)
Please schedule 30-day monitor for r/o AF in setting of TIA.

## 2018-08-27 ENCOUNTER — Telehealth: Payer: Self-pay | Admitting: *Deleted

## 2018-08-27 ENCOUNTER — Other Ambulatory Visit: Payer: Self-pay | Admitting: *Deleted

## 2018-08-27 ENCOUNTER — Telehealth: Payer: Self-pay | Admitting: Neurology

## 2018-08-27 DIAGNOSIS — G459 Transient cerebral ischemic attack, unspecified: Secondary | ICD-10-CM

## 2018-08-27 DIAGNOSIS — R42 Dizziness and giddiness: Secondary | ICD-10-CM

## 2018-08-27 NOTE — Telephone Encounter (Signed)
[facility] umr/medicare/tricare order sent to GI. No auth. They will reach out to the pt to schedule.

## 2018-08-27 NOTE — Telephone Encounter (Signed)
[employer] to ship 30 day cardiac event monitor to your home.  Please follow instructions in kit and call [employer] to send your baseline recording.

## 2018-08-28 DIAGNOSIS — I10 Essential (primary) hypertension: Secondary | ICD-10-CM | POA: Diagnosis not present

## 2018-08-28 DIAGNOSIS — M797 Fibromyalgia: Secondary | ICD-10-CM | POA: Diagnosis not present

## 2018-08-28 DIAGNOSIS — K219 Gastro-esophageal reflux disease without esophagitis: Secondary | ICD-10-CM | POA: Diagnosis not present

## 2018-08-28 DIAGNOSIS — N951 Menopausal and female climacteric states: Secondary | ICD-10-CM | POA: Diagnosis not present

## 2018-08-28 DIAGNOSIS — E559 Vitamin D deficiency, unspecified: Secondary | ICD-10-CM | POA: Diagnosis not present

## 2018-08-28 DIAGNOSIS — M159 Polyosteoarthritis, unspecified: Secondary | ICD-10-CM | POA: Diagnosis not present

## 2018-08-28 DIAGNOSIS — E039 Hypothyroidism, unspecified: Secondary | ICD-10-CM | POA: Diagnosis not present

## 2018-08-28 DIAGNOSIS — R7302 Impaired glucose tolerance (oral): Secondary | ICD-10-CM | POA: Diagnosis not present

## 2018-08-28 DIAGNOSIS — E782 Mixed hyperlipidemia: Secondary | ICD-10-CM | POA: Diagnosis not present

## 2018-09-04 ENCOUNTER — Other Ambulatory Visit: Payer: Self-pay | Admitting: Neurology

## 2018-09-04 ENCOUNTER — Telehealth: Payer: Self-pay | Admitting: Neurology

## 2018-09-04 DIAGNOSIS — R5383 Other fatigue: Secondary | ICD-10-CM

## 2018-09-04 DIAGNOSIS — E11649 Type 2 diabetes mellitus with hypoglycemia without coma: Secondary | ICD-10-CM

## 2018-09-04 DIAGNOSIS — R29898 Other symptoms and signs involving the musculoskeletal system: Secondary | ICD-10-CM

## 2018-09-04 DIAGNOSIS — R42 Dizziness and giddiness: Secondary | ICD-10-CM

## 2018-09-04 DIAGNOSIS — H53483 Generalized contraction of visual field, bilateral: Secondary | ICD-10-CM

## 2018-09-04 DIAGNOSIS — H8190 Unspecified disorder of vestibular function, unspecified ear: Secondary | ICD-10-CM

## 2018-09-04 DIAGNOSIS — E46 Unspecified protein-calorie malnutrition: Secondary | ICD-10-CM

## 2018-09-04 DIAGNOSIS — E8809 Other disorders of plasma-protein metabolism, not elsewhere classified: Secondary | ICD-10-CM

## 2018-09-04 MED ORDER — ALPRAZOLAM 1 MG PO TABS: ORAL_TABLET | ORAL | 0 refills | Status: DC

## 2018-09-04 MED FILL — ALPRAZolam 1 MG TABS: 1 | 1 days supply | Qty: 2 | Fill #0

## 2018-09-04 NOTE — Telephone Encounter (Signed)
Patient states she will need Xanax  vail um for MRI .  [facility] out patient. Patient is Claustrophobic.   Patient would like lab order's sent to her and she will set up with [facility].  Patient is at home and she will call [facility] to reschedule her MRI and she will call us back to schedule follow with Dr. Brett Fairy . Per Dr. Brett Fairy  patient needs to come to the office for her follow up . Dr. Brett Fairy needs to do a gait evaluation .

## 2018-09-04 NOTE — Telephone Encounter (Signed)
Called the patient to inform her that lab was ordered through [facility]. She states that it is best covered under [facility]. Informed her that I will see if we can order through [facility] and then mail the order req to her. Confirmed the pharmacy on file. Advised the patient Dr Brett Fairy was out of the office until Monday but that I would inform her that pt is in need of this upon her return to work. Patient ask that the med be sent to [facility] outpatient pharmacy so that they can mail to the patient. Pt states once she gets the medication she will schedule the MRI with GI. She will then call and schedule a follow up after that.

## 2018-09-04 NOTE — Telephone Encounter (Signed)
No objection to using low dose Xanax for MRI or MRA procedure pre medication

## 2018-09-17 ENCOUNTER — Ambulatory Visit (INDEPENDENT_AMBULATORY_CARE_PROVIDER_SITE_OTHER): Payer: 59

## 2018-09-17 DIAGNOSIS — R42 Dizziness and giddiness: Secondary | ICD-10-CM

## 2018-09-17 DIAGNOSIS — G459 Transient cerebral ischemic attack, unspecified: Secondary | ICD-10-CM

## 2018-09-18 ENCOUNTER — Telehealth: Payer: Self-pay | Admitting: Physician Assistant

## 2018-09-18 NOTE — Telephone Encounter (Signed)
Call from [employer]:  Patient selected "passed out" as a symptom. Rhythm was sinus rhythm. HR 94. No Afib, no heart block, no pauses.  [employer] attempted to call the patient, no answer. Patient also selected the following symptoms: Dizziness Lightheadedness Chest pain or pressure Passed out  I called the patient. She stated she didn't pass out. She must have selected this option accidentally. She is feeling better. She was a bit dizzy, but had just burned her arm and likely had a vagal episode.    Tami Lin Duke, PA-C 09/18/2018, 8:08 PM

## 2018-09-24 ENCOUNTER — Telehealth: Payer: Self-pay | Admitting: Interventional Cardiology

## 2018-09-24 MED FILL — PRAVASTATIN NA 40 MG TAB: 40 | 90 days supply | Qty: 90 | Fill #2

## 2018-09-24 MED FILL — DAYTRANA 15 MG/9 HR PATCH: 15 | 30 days supply | Qty: 30 | Fill #0

## 2018-09-24 NOTE — Telephone Encounter (Signed)
New Message           Patient is calling to report the  bad reaction that she is having to the adhesive,  and [employer] asked her to call to let  Dr. Tamala Julian know. Pls advise

## 2018-09-25 MED FILL — PREMARIN 0.45 MG TABLET: 0.45 | 90 days supply | Qty: 90 | Fill #2

## 2018-09-25 NOTE — Telephone Encounter (Signed)
LMVM-  Return call regarding reaction to adhesive.  If you call [employer] , they can send you electrode set up for sensitive skin.  This would also give your skin a couple of days to heal.  If you need to discuss this further, please call our office at [phone].

## 2018-10-06 ENCOUNTER — Other Ambulatory Visit: Payer: Self-pay

## 2018-10-06 ENCOUNTER — Ambulatory Visit
Admission: RE | Admit: 2018-10-06 | Discharge: 2018-10-06 | Disposition: A | Discharge status: Home / Self Care | Payer: 59 | Source: Ambulatory Visit | Attending: Neurology | Admitting: Neurology

## 2018-10-06 DIAGNOSIS — H814 Vertigo of central origin: Secondary | ICD-10-CM

## 2018-10-06 MED ORDER — GADOBENATE DIMEGLUMINE 529 MG/ML IV SOLN
15.0000 mL | Freq: Once | INTRAVENOUS | Status: AC | PRN
  Administered 2018-10-06: 15 mL via INTRAVENOUS

## 2018-10-09 ENCOUNTER — Telehealth: Payer: Self-pay | Admitting: Neurology

## 2018-10-09 NOTE — Telephone Encounter (Signed)
-----   Message from Larey Seat, MD sent at 10/08/2018  4:17 PM EDT ----- FINDINGS: The imaged extracranial and intracranial portions of the  internal carotid arteries appear normal. The middle cerebral and anterior  cerebral arteries appear normal.   In the posterior circulation, the left vertebral artery is dominant and  the right vertebral artery appears to terminate as the right PICA.Marland Kitchen No  stenosis is noted within the vertebral arteries and the basilar arteries.  The posterior cerebral arteries appear normal.   No aneurysms were identified.  IMPRESSION: This is a normal MR angiogram of the intracranial arteries.   Incidental note is made of a dominant left vertebral artery with the right  vertebral artery terminating as the posterior inferior cerebellar artery.   This is a normal variant.    INTERPRETING PHYSICIAN:  Richard A. Felecia Shelling, MD, PhD, Charlynn Grimes  Cc: Myriam Jacobson,  please send copy to Dr. Lorne Skeens, MD

## 2018-10-09 NOTE — Telephone Encounter (Signed)
Called and reviewed the MRI and MRA findings that Dr. Brett Fairy resulted. Informed her of the normal findings. Advised the patient of results. Pt verbalized understanding. Pt requested to schedule a follow up. I was able to schedule 7/21 at 1:30 pm. Pt verbalized understanding or arriving at 1 to check in.

## 2018-10-10 NOTE — Telephone Encounter (Signed)
Follow up   Patient needs a call in reference to monitor per the previous message. Please call.

## 2018-10-29 ENCOUNTER — Other Ambulatory Visit: Payer: Self-pay | Admitting: Interventional Cardiology

## 2018-10-29 ENCOUNTER — Other Ambulatory Visit: Payer: Self-pay

## 2018-10-29 DIAGNOSIS — G459 Transient cerebral ischemic attack, unspecified: Secondary | ICD-10-CM

## 2018-10-29 DIAGNOSIS — R42 Dizziness and giddiness: Secondary | ICD-10-CM

## 2018-10-29 MED FILL — L-METHYLFOLATE-ALGAE 15-90.: 15-90.314 | 30 days supply | Qty: 30 | Fill #0

## 2018-10-30 ENCOUNTER — Ambulatory Visit: Payer: Self-pay | Admitting: Neurology

## 2018-10-30 MED FILL — DAYTRANA 15 MG/9 HR PATCH: 15 | 30 days supply | Qty: 30 | Fill #0

## 2018-10-30 MED FILL — SYNTHROID 25 MCG TABLET: 25 | 90 days supply | Qty: 135 | Fill #0

## 2018-11-08 ENCOUNTER — Telehealth: Payer: Self-pay | Admitting: Neurology

## 2018-11-08 NOTE — Telephone Encounter (Signed)
Called the patient to review the cardiac monitor results. She had a reaction to adhesive from wearing the results. She was only through out the whole process able to wear the event monitor a total of 6 days. There were several times she would have a spell and wasn't wearing the monitor to capture.  EKG with apple watch and was able to print a rhythm from that when she had an episode that her husband wanted her to bring in for the visit. She is scheduled for 10/20 follow up. I have placed on wait list and will advise her if something else opens up.

## 2018-11-08 NOTE — Telephone Encounter (Signed)
-----   Message from Larey Seat, MD sent at 11/06/2018  5:00 PM EDT -----  Narrative & Impression    NSR is the predominant rhythm, Ave HR 80 bpm with range 62-157 bpm  Syncope while wearing monitor not associated with abnormal rhythm  No AF, AFl, or VT.   n short - no abnormal rhythm was found.    Cc Dr Altheimer,

## 2018-11-14 DIAGNOSIS — F900 Attention-deficit hyperactivity disorder, predominantly inattentive type: Secondary | ICD-10-CM | POA: Diagnosis not present

## 2018-11-14 DIAGNOSIS — G894 Chronic pain syndrome: Secondary | ICD-10-CM | POA: Diagnosis not present

## 2018-11-14 MED FILL — ESCITALOPRAM 10 MG TABLET: 10 | 90 days supply | Qty: 180 | Fill #0

## 2018-11-14 MED FILL — DAYTRANA 10 MG/9 HR PATCH: 10 | 30 days supply | Qty: 30 | Fill #0

## 2018-11-14 MED FILL — traZODone HCL 50 MG TABS: 50 | 90 days supply | Qty: 180 | Fill #0

## 2018-11-20 MED FILL — SSD 1% CREAM: 1 | 20 days supply | Qty: 25 | Fill #0

## 2018-11-20 MED FILL — BENZONATATE 100 MG CAPS: 100 | 10 days supply | Qty: 30 | Fill #0

## 2018-11-20 MED FILL — ACCU-CHEK FASTCLIX LANCETS: 90 days supply | Qty: 204 | Fill #0

## 2018-11-20 MED FILL — ACCU-CHEK GUIDE STRP: 90 days supply | Qty: 200 | Fill #0

## 2018-11-20 MED FILL — ALBUTEROL SULFATE HFA 108 (: 108 (90 BAS | 17 days supply | Qty: 18 | Fill #0

## 2018-11-20 MED FILL — EPINEPHRINE 0.3 MG AUTO-INJ: 0.3 | 15 days supply | Qty: 2 | Fill #0

## 2018-11-26 DIAGNOSIS — H5203 Hypermetropia, bilateral: Secondary | ICD-10-CM | POA: Diagnosis not present

## 2018-11-27 DIAGNOSIS — M797 Fibromyalgia: Secondary | ICD-10-CM | POA: Diagnosis not present

## 2018-11-27 DIAGNOSIS — E782 Mixed hyperlipidemia: Secondary | ICD-10-CM | POA: Diagnosis not present

## 2018-11-27 DIAGNOSIS — I10 Essential (primary) hypertension: Secondary | ICD-10-CM | POA: Diagnosis not present

## 2018-11-27 DIAGNOSIS — K219 Gastro-esophageal reflux disease without esophagitis: Secondary | ICD-10-CM | POA: Diagnosis not present

## 2018-11-27 DIAGNOSIS — R7302 Impaired glucose tolerance (oral): Secondary | ICD-10-CM | POA: Diagnosis not present

## 2018-11-27 DIAGNOSIS — M159 Polyosteoarthritis, unspecified: Secondary | ICD-10-CM | POA: Diagnosis not present

## 2018-11-27 DIAGNOSIS — E039 Hypothyroidism, unspecified: Secondary | ICD-10-CM | POA: Diagnosis not present

## 2018-11-27 DIAGNOSIS — E559 Vitamin D deficiency, unspecified: Secondary | ICD-10-CM | POA: Diagnosis not present

## 2018-11-27 DIAGNOSIS — N951 Menopausal and female climacteric states: Secondary | ICD-10-CM | POA: Diagnosis not present

## 2018-11-27 MED FILL — RESTASIS 0.05% EYE EMULSION: 0.05 | 30 days supply | Qty: 60 | Fill #0

## 2018-11-29 ENCOUNTER — Other Ambulatory Visit: Payer: Self-pay

## 2018-11-29 ENCOUNTER — Encounter: Payer: Self-pay | Admitting: Neurology

## 2018-11-29 ENCOUNTER — Ambulatory Visit (INDEPENDENT_AMBULATORY_CARE_PROVIDER_SITE_OTHER): Payer: 59 | Admitting: Neurology

## 2018-11-29 VITALS — BP 109/75 | HR 84 | Temp 97.3°F | Ht 59.0 in | Wt 173.0 lb

## 2018-11-29 DIAGNOSIS — H8190 Unspecified disorder of vestibular function, unspecified ear: Secondary | ICD-10-CM

## 2018-11-29 DIAGNOSIS — R2681 Unsteadiness on feet: Secondary | ICD-10-CM

## 2018-11-29 DIAGNOSIS — M549 Dorsalgia, unspecified: Secondary | ICD-10-CM | POA: Diagnosis not present

## 2018-11-29 DIAGNOSIS — R42 Dizziness and giddiness: Secondary | ICD-10-CM | POA: Diagnosis not present

## 2018-11-29 DIAGNOSIS — H53483 Generalized contraction of visual field, bilateral: Secondary | ICD-10-CM | POA: Diagnosis not present

## 2018-11-29 DIAGNOSIS — E11649 Type 2 diabetes mellitus with hypoglycemia without coma: Secondary | ICD-10-CM | POA: Diagnosis not present

## 2018-11-29 MED ORDER — CYPROHEPTADINE HCL 4 MG PO TABS: 4.0000 mg | ORAL_TABLET | Freq: Three times a day (TID) | ORAL | 0 refills | Status: DC | PRN

## 2018-11-29 MED FILL — CYPROHEPTADINE HCL 4 MG TAB: 4 | 10 days supply | Qty: 30 | Fill #0

## 2018-11-29 MED FILL — L-METHYLFOLATE-ALGAE 15-90.: 15-90.314 | 30 days supply | Qty: 30 | Fill #1

## 2018-11-29 NOTE — Patient Instructions (Signed)
Dizziness  Dizziness is a common problem. It is a feeling of unsteadiness or light-headedness. You may feel like you are about to faint. Dizziness can lead to injury if you stumble or fall. Anyone can become dizzy, but dizziness is more common in older adults. This condition can be caused by a number of things, including medicines, dehydration, or illness. Follow these instructions at home: Eating and drinking  Drink enough fluid to keep your urine clear or pale yellow. This helps to keep you from becoming dehydrated. Try to drink more clear fluids, such as water.  Do not drink alcohol.  Limit your caffeine intake if told to do so by your health care provider. Check ingredients and nutrition facts to see if a food or beverage contains caffeine.  Limit your salt (sodium) intake if told to do so by your health care provider. Check ingredients and nutrition facts to see if a food or beverage contains sodium. Activity  Avoid making quick movements. ? Rise slowly from chairs and steady yourself until you feel okay. ? In the morning, first sit up on the side of the bed. When you feel okay, stand slowly while you hold onto something until you know that your balance is fine.  If you need to stand in one place for a long time, move your legs often. Tighten and relax the muscles in your legs while you are standing.  Do not drive or use heavy machinery if you feel dizzy.  Avoid bending down if you feel dizzy. Place items in your home so that they are easy for you to reach without leaning over. Lifestyle  Do not use any products that contain nicotine or tobacco, such as cigarettes and e-cigarettes. If you need help quitting, ask your health care provider.  Try to reduce your stress level by using methods such as yoga or meditation. Talk with your health care provider if you need help to manage your stress. General instructions  Watch your dizziness for any changes.  Take over-the-counter and  prescription medicines only as told by your health care provider. Talk with your health care provider if you think that your dizziness is caused by a medicine that you are taking.  Tell a friend or a family member that you are feeling dizzy. If he or she notices any changes in your behavior, have this person call your health care provider.  Keep all follow-up visits as told by your health care provider. This is important. Contact a health care provider if:  Your dizziness does not go away.  Your dizziness or light-headedness gets worse.  You feel nauseous.  You have reduced hearing.  You have new symptoms.  You are unsteady on your feet or you feel like the room is spinning. Get help right away if:  You vomit or have diarrhea and are unable to eat or drink anything.  You have problems talking, walking, swallowing, or using your arms, hands, or legs.  You feel generally weak.  You are not thinking clearly or you have trouble forming sentences. It may take a friend or family member to notice this.  You have chest pain, abdominal pain, shortness of breath, or sweating.  Your vision changes.  You have any bleeding.  You have a severe headache.  You have neck pain or a stiff neck.  You have a fever. These symptoms may represent a serious problem that is an emergency. Do not wait to see if the symptoms will go away. Get medical  help right away. Call your local emergency services (911 in the [place] Do not drive yourself to the hospital. Summary  Dizziness is a feeling of unsteadiness or light-headedness. This condition can be caused by a number of things, including medicines, dehydration, or illness.  Anyone can become dizzy, but dizziness is more common in older adults.  Drink enough fluid to keep your urine clear or pale yellow. Do not drink alcohol.  Avoid making quick movements if you feel dizzy. Monitor your dizziness for any changes. This information is not intended to  replace advice given to you by your health care provider. Make sure you discuss any questions you have with your health care provider. Document Released: 09/21/2000 Document Revised: 03/31/2017 Document Reviewed: 04/30/2016 Elsevier Patient Education  2020 [employer].

## 2018-11-29 NOTE — Progress Notes (Addendum)
History of Present Illness:  11-29-2018 , RV    08-20-2018, virtual visit  The patient reports having a variety of spells, that she was able to cluster in 4 different spell groups.   1) #1 in 2019 in the heat of summer the patient went to [place] to visit the [place] with family.  There she hit her head while alighted from the car, on the vertex of her skull.  She reports feeling as if if there was sand running from the vertex down to the spine.  This lasted for a while and she felt increasingly weak but she continued [place] the zoo ( 15 minutes of recovery time )  and was apparently able to ambulate with difficulties .  She vomited about 2.5 hours after she hit her head. She had a migraine at that point.  Her husband had not noted her to be pale or in any way confused she had regular pulses she reports that she felt peripheral vision loss accompanied this weakness.  2) spell #2 is a sensation of increased pressure in her head that occurs nearly daily between 3 and 6 PM she likens the sensation to a feeling of low blood sugar or hypoglycemia but heart rate and blood pressure are normal.  She reports feeling better when she eats or drinks something. (may also be the time her patch wears off). Afrin and Claritin have somewhat helped- see notes below.   3) spell #3 occurs when she looks up turns her head towards the ceiling and is associated with blurred vision peripheral vision loss a shrinking of the visual field she feels weak unsteady and it occurs when she focuses on a task as well. Vertigo.  This has been present for about 18 months and she reports that her gait has been changed while she has such a spell.  She reportedly "walks like a duck".  Her blood sugars are normal during this time.  4) spell #4 is described as a feeling as if pulled forward or backward a sensation of being in motion while not moving a kind of non-rotational vertigo with propulsive and retropulsive tendencies.  There  has been no fall.  The patient's father suffered from myasthenia gravis,  and she has developed hypoglycemia symptoms, some of her spells have improved after taking food.  There is still a possibility of a MG manifestation in her, the weakness spells, the increased fatigue , but it would not account for the visual field restriction.    I will need to look at MRI and MRA brain , Ach Ab, and a cardiac monitor can be helpful to rule out other causes,  Orthostatic BP and heart rate can be done by PCP and/ or cardiologist. Gait Exam with Dr Doran Durand.      1) MG Ach ab testing,  2) Gait exam ( Dr Doran Durand) 3) In the meanting MRI and MRA brain and neck.  4) Cardiac monitor with  Dr. Pernell Dupre , MD  RV face to face - 4-6 weeks.* May need Vestibular therapy, will need orthostatics as well.          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 Elyse Hsu for evaluation of spells. I had met  virtually on 08-20-2018  I have seen Rita Gregory in the past she has a history of fibromyalgia, adult ADHD, now over the last 4 years there have been some changes she became prediabetic, she had a back surgery in 2016 on the lumbar spine performed by Dr. Kristeen Miss.  She reports the above-named type of spells is a fairly new finding.  She carries also a diagnosis of mixed dyslipidemia, hypertension, primary hypothyroidism vitamin D deficiency, GERD without esophagitis, menopausal symptoms, osteoarthritis, chronic fatigue and chronic pain.  She reports chronic left shoulder pain and bunion pain in her feet but continues to manage these conservatively.  She was found to have a patent foramen ovale at [facility] cardiology clinic and has followed with Dr. Daneen Schick, MD.  HbA1c  has increased from 5.7 in 2008-6.1 in October 2019.   Rv from 11-29-2018- no 'big' spells since our last conversation, she takes often meclizine and claritin, and feels this prevents dizziness and Migraine.  No tunnel vision - her ophthalmologist evaluated her last week-  He believes the unfocussed gaze is due to astigmatism. Mild macular degeneration. Daytrana 15 mg patch for mental focus, giving her 9 hours of energy and concentration. .  She developed skin sensitivity for any sticky tape, sticky plaster. This makes obtaining a EKG, EEG or sleep study difficult.    I had ordered in the meantime since our virtual visit for the patient to undergo an MRI of the brain with and without contrast as well as an MRA of the head there was no aneurysm found today Dr. Felecia Shelling interpreted the study on 6-29 2020 extracranial and intracranial portions of the internal carotid arteries were also noted without calcification. MRI brain atrophy, mild and generalized.    Review of Systems: Out of a complete 14 system review, the patient complains of only the following symptoms, and all other reviewed systems are negative.  Spells as above - Vertigo, dizziness, congestion.   Social History   Socioeconomic History   Marital status: Married    Spouse name: Legrand Como   Number of children: Not on file   Years of education: Not on file   Highest education level: Not on file  Occupational History   Occupation: Facilities manager: [employer]   Financial resource strain: Not on file   Food insecurity    Worry: Not on file    Inability: Not on file   Transportation needs    Medical: Not on file    Non-medical: Not on file  Tobacco Use   Smoking status: Passive Smoke Exposure - Never Smoker   Smokeless tobacco: Never Used   Tobacco comment: [employer] attent[employer] for 7 years. 1966-1973 Passive smoker  Substance and Sexual Activity   Alcohol use: Yes    Comment: Very rarely    Drug use: No    Sexual activity: Yes    Partners: Male  Lifestyle   Physical activity    Days per week: Not on file    Minutes per session: Not on file   Stress: Not on file  Relationships   Social connections    Talks on phone: Not on file    Gets together: Not on file    Attends religious service: Not on file    Active member of club or organization: Not on file    Attends meetings of clubs or organizations: Not on file    Relationship status: Not on file   Intimate partner violence  Fear of current or ex partner: Not on file    Emotionally abused: Not on file    Physically abused: Not on file    Forced sexual activity: Not on file  Other Topics Concern   Not on file  Social History Narrative   Husband is Dr. Tillman Sers     Family History  Problem Relation Age of Onset   Ovarian cancer Mother        passed at age 5   Heart disease Father    Kidney disease Father        Kidney cancer   Diabetes type II Father        passed at 24 yrs old   Colon cancer Father        cancereous polyps   Stomach cancer Paternal Uncle    Bipolar disorder Sister        Osteopenia   Bipolar disorder Son     Past Medical History:  Diagnosis Date   Arthritis of left acromioclavicular joint    Cervical disc disorder with left arm radiculopathy of cervical region 04/20/2017   Chronic pain    Dr. Ellene Route follows   Colon polyp 11/09/2007   Hyperplastic    Depression    Diverticulosis of colon (without mention of hemorrhage) 11/09/2007   Esophageal reflux    Fibromyalgia    Hypertension    in the past   Hypothyroidism    Kyphoscoliosis    40 degree bend ro right side   Paroxysmal spells 02/13/2013   Patent foramen ovale    hx.- Dr. Lemmie Evens. Smith,cardiology- follows   Pre-diabetes    Controls with diet   Rotator cuff tear, non-traumatic, left    Shoulder impingement, left     Past Surgical History:  Procedure Laterality Date   ABDOMINAL HYSTERECTOMY      ANTERIOR LATERAL LUMBAR FUSION 4 LEVELS Left 06/16/2015   Procedure: ANTERIOR LATERAL LUMBAR FUSION THORACIC TWELVE-LUMBAR FOUR;  Surgeon: Kristeen Miss, MD;  Location: [facility];  Service: Neurosurgery;  Laterality: Left;  Thoracolumbar spine   APPENDECTOMY     CESAREAN SECTION     x2   CHOLECYSTECTOMY     dry eyes     uses Restasis daily   ESOPHAGOGASTRODUODENOSCOPY  02/23/2012   Procedure: ESOPHAGOGASTRODUODENOSCOPY (EGD);  Surgeon: Irene Shipper, MD;  Location: [facility];  Service: Endoscopy;  Laterality: N/A;   JOINT REPLACEMENT     RTKA '05   KNEE ARTHROSCOPY W/ ACL RECONSTRUCTION     Rt. Knee   POSTERIOR LUMBAR FUSION 4 LEVEL N/A 06/16/2015   Procedure: POSTERIOR LUMBAR FUSION LUMBAR FOUR-FIVE LUMBAR FIVE-SACRAL ONE;  Surgeon: Kristeen Miss, MD;  Location: [facility];  Service: Neurosurgery;  Laterality: N/A;   RADIOLOGY WITH ANESTHESIA N/A 06/15/2015   Procedure: RADIOLOGY WITH ANESTHESIA;  Surgeon: Medication Radiologist, MD;  Location: [facility];  Service: Radiology;  Laterality: N/A;   right rotator cuff  2011   sx   SHOULDER ARTHROSCOPY Left 04/28/2017   Procedure: ARTHROSCOPY SHOULDER WITH EXTENSIVE DEBRIDEMENT ROTATOR CUFF TEAR;  Surgeon: Elsie Saas, MD;  Location: [facility];  Service: Orthopedics;  Laterality: Left;    Current Outpatient Medications  Medication Sig Dispense Refill   acetaminophen (TYLENOL) 650 MG CR tablet Take 650 mg by mouth every 8 (eight) hours as needed for pain.     albuterol (PROVENTIL HFA;VENTOLIN HFA) 108 (90 BASE) MCG/ACT inhaler Inhale 2 puffs into the lungs every 6 (six) hours as needed. For shortness  of breath     carboxymethylcellulose (REFRESH PLUS) 0.5 % SOLN 1 drop daily as needed.     conjugated estrogens (PREMARIN) vaginal cream Place 0.625 mg vaginally daily as needed.      Cyanocobalamin (VITAMIN B 12 PO) Take 3,000 mcg by mouth daily.     cycloSPORINE (RESTASIS) 0.05 % ophthalmic emulsion Place 1 drop  into both eyes 2 (two) times daily.     EPINEPHrine (EPIPEN 2-PAK) 0.3 mg/0.3 mL DEVI Inject 0.3 mg into the muscle once. To medications, touch, or inhaltions     escitalopram (LEXAPRO) 10 MG tablet Take 10 mg by mouth 2 (two) times daily. Brand name only     esomeprazole (NEXIUM) 20 MG capsule Take 20-40 mg by mouth at bedtime as needed.     ketoconazole (NIZORAL) 2 % cream Apply 1 application topically 2 (two) times daily as needed for irritation.      L-Methylfolate-Algae (DEPLIN 15) 15-90.314 MG CAPS Take 1 capsule by mouth daily.  1   lansoprazole (PREVACID) 30 MG capsule Take 30 mg by mouth 2 (two) times daily before a meal.     loratadine (CLARITIN) 10 MG tablet Take 10 mg by mouth daily as needed for allergies.     meclizine (ANTIVERT) 25 MG tablet Take 25 mg by mouth 3 (three) times daily as needed for dizziness.     methylphenidate (DAYTRANA) 10 mg/9hr Place 1 patch onto the skin daily as needed (74m daily/10 mg as a prn). Take if extra if needed for ADHD. Wear patch for 9 hours only each day     methylphenidate (DAYTRANA) 15 mg/9hr Place 1 patch onto the skin daily. wear patch for 9 hours only each day     miconazole (MONISTAT 7) 2 % vaginal cream Use as needed     Multiple Vitamins-Minerals (CENTRUM SILVER PO) Take 1 tablet by mouth. CENTRUM SILVER GUMMY ONE A DAY     oxymetazoline (NASAL RELIEF) 0.05 % nasal spray Use 1-2 sprays each nostril every 12 hours as needed, for occasional use only     Polyvinyl Alcohol-Povidone (REFRESH OP) Apply to eye.     pravastatin (PRAVACHOL) 40 MG tablet Take 40 mg by mouth at bedtime.     silver sulfADIAZINE (SILVADENE) 1 % cream Use as directed as needed for burn     testosterone (ANDROGEL) 50 MG/5GM (1%) GEL Use as directed 3 times a week as needed     traZODone (DESYREL) 50 MG tablet Take 50-100 mg by mouth at bedtime.      Vitamin D, Ergocalciferol, (DRISDOL) 50000 units CAPS capsule Take 50,000 Units by mouth every 7 (seven)  days.     No current facility-administered medications for this visit.     Allergies as of 11/29/2018 - Review Complete 11/29/2018  Allergen Reaction Noted   Betadine [povidone iodine] Other (See Comments) 04/28/2017   Fentanyl Shortness Of Breath 03/27/2008   Morphine and related Anaphylaxis 09/26/2011   Bupropion hcl  03/27/2008   Codeine Nausea And Vomiting 03/27/2008   Demerol [meperidine] Nausea And Vomiting 09/26/2011   Dextromethorphan Other (See Comments) 09/26/2011   Guaifenesin & derivatives  09/26/2011   Lipitor [atorvastatin]  09/26/2011   Lovaza [omega-3-acid ethyl esters] Diarrhea 09/26/2011   Lyrica [pregabalin]  09/26/2011   Naltrexone  09/26/2011   Percodan [oxycodone-aspirin] Nausea And Vomiting 09/26/2011   Prilosec [omeprazole]  09/26/2011   Topamax [topiramate]  09/26/2011   Valium [diazepam]  09/26/2011   Versed [midazolam]  09/26/2011   Adhesive [tape]  Rash 50/06/7046   Concerta [methylphenidate] Palpitations 09/26/2011   Doxycycline Rash 05/06/2016   Lamotrigine Rash 03/27/2008   Nickel Rash 09/26/2011   Oxycodone-acetaminophen Nausea And Vomiting and Rash 03/27/2008   Percocet [oxycodone-acetaminophen] Nausea And Vomiting and Rash 09/26/2011   Tegaderm ag mesh [silver] Rash 09/26/2011    Vitals: BP 109/75    Pulse 84    Temp (!) 97.3 F (36.3 C)    Ht '4\' 11"'  (1.499 m)    Wt 173 lb (78.5 kg)    LMP  (Exact Date)    BMI 34.94 kg/m  Last Weight:  Wt Readings from Last 1 Encounters:  11/29/18 173 lb (78.5 kg)   Last Height:   Ht Readings from Last 1 Encounters:  11/29/18 '4\' 11"'  (1.499 m)    Physical exam on 11-29-2018  General: The patient is awake, alert and appears not in acute distress. The patient is well groomed. Head: Normocephalic, atraumatic.  Neck is supple. ROM intact , Mallampati 2 - with a very elongated uvula - but soft palate rises and there is no lateral crowding, neck circumference:14.25".  Patient is  able to hold her breath for 20 sec. No SOB .   Neurologic exam : The patient is awake and alert, oriented to place and time.   Memory subjective  described as intact. There is a sufficient attention span & concentration ability, but timeframe of events is sometimes confusing.  Marland Kitchen Speech is fluent without dysarthria, dysphonia or aphasia. Mood and affect are appropriate.  Cranial nerves: No changes in her sense of taste or smell.  She noted that coffee does taste different.  Pupils are equal in size- reactive to light-   Extraocular movements  in vertical and horizontal planes intact.  Facial motor strength is symmetric and tongue and uvula move midline.  Tongue protrusion into either cheek is normal. Uvula midline. Hearing non lateralized in rinne- weber.   Shoulder shrug is normal.    Motor exam: symmetric ROM  in all extremities, some restriction to shoulder elevation, left more than right. She has reportedly "weak rotator cuffs". Right shoulder injured in a fall.  Both shoulders look droopy. Reports weakness of grip strength. I feel a similar strength left and right.  Normal muscle tone.  Knee replacement on the right, right left leg is shorter.   left rotator cuff " shredded" in another fall.  She has problems to lift her feet- stumbling frequently.  Bilateral pronator drift, worse on the left,drifts bilaterally antigravity.  Coordination: Rapid alternating movements slowed -finger to nose bilaterally with dysmetria, and very slow -  without evidence of ataxia,  tremor.  Gait and station: Patient walks currently without assistive device ,  her right leg is shorter by 1/4 inch and this may be compensated with a shoe insert.  Spontaneous walking is normal, heel to toe is very ataxic, she cannot perform toe or heel.  I suspect there is a feeling of knees buckle, sharp pain when rotating in the knee.    Assessment and Plan:   I will refresh the order for myasthenia.   1)Sinus  fullness, more often when congested anyway, but also in response to stress. I like to have an ENT evaluation.   2) I doubt intracranial hypertension- and patient is not interested in spinal tap with manometry. She has scoliosis.   3) stress , very sensitive to stress , and again, stress brings on congestion.  Rarely having a runny nose- but post  Nasal drip. -  trial of periactine. BP, Glucose and hydration were all normal when she checked.   She wore an EEG monitor that only captured sinus rhythm.   4) gait disorder is instability -not ataxia, not central - this is related to joints and spine.    Asencion Partridge Marylynne Keelin MD 11/29/2018    I reviewed the images with the patient.  I discussed the assessment and treatment plan with the patient. The patient was provided an opportunity to ask questions and all were answered. The patient agreed with the plan and demonstrated an understanding of the instructions.   I provided 40 minutes of face-to-face time during this encounter. I also reviewed additional notes by other specialists as long as available in [place], Dr Altheimer's referral , and the last noted physical exam.  Total time 40 minutes.   Larey Seat, MD

## 2018-12-19 MED FILL — DAYTRANA 15 MG/9 HR PATCH: 15 | 30 days supply | Qty: 30 | Fill #0

## 2018-12-25 DIAGNOSIS — L820 Inflamed seborrheic keratosis: Secondary | ICD-10-CM | POA: Diagnosis not present

## 2018-12-25 DIAGNOSIS — L298 Other pruritus: Secondary | ICD-10-CM | POA: Diagnosis not present

## 2018-12-25 DIAGNOSIS — L821 Other seborrheic keratosis: Secondary | ICD-10-CM | POA: Diagnosis not present

## 2018-12-25 MED FILL — TRIAMCINOLONE 0.1% CREAM: 0.1 | 15 days supply | Qty: 80 | Fill #0

## 2018-12-25 MED FILL — PREMARIN 0.45 MG TABLET: 0.45 | 90 days supply | Qty: 90 | Fill #3

## 2018-12-25 MED FILL — L-METHYLFOLATE-ALGAE 15-90.: 15-90.314 | 30 days supply | Qty: 30 | Fill #2

## 2018-12-25 MED FILL — PRAVASTATIN NA 40 MG TAB: 40 | 90 days supply | Qty: 90 | Fill #3

## 2018-12-25 MED FILL — HYDROCORTISONE 2.5% CREAM: 2.5 | 30 days supply | Qty: 30 | Fill #0

## 2019-01-23 MED FILL — L-METHYLFOLATE-ALGAE 15-90.: 15-90.314 | 30 days supply | Qty: 30 | Fill #0

## 2019-01-23 MED FILL — DAYTRANA 15 MG/9 HR PATCH: 15 | 30 days supply | Qty: 30 | Fill #0

## 2019-01-29 ENCOUNTER — Ambulatory Visit: Payer: Self-pay | Admitting: Neurology

## 2019-02-06 ENCOUNTER — Other Ambulatory Visit: Payer: Self-pay

## 2019-02-06 DIAGNOSIS — Z20828 Contact with and (suspected) exposure to other viral communicable diseases: Secondary | ICD-10-CM | POA: Diagnosis not present

## 2019-02-06 DIAGNOSIS — Z20822 Contact with and (suspected) exposure to covid-19: Secondary | ICD-10-CM

## 2019-02-07 LAB — NOVEL CORONAVIRUS, NAA: SARS-CoV-2, NAA: NOT DETECTED

## 2019-02-08 DIAGNOSIS — G894 Chronic pain syndrome: Secondary | ICD-10-CM | POA: Diagnosis not present

## 2019-02-08 DIAGNOSIS — F900 Attention-deficit hyperactivity disorder, predominantly inattentive type: Secondary | ICD-10-CM | POA: Diagnosis not present

## 2019-02-08 MED FILL — traZODone HCL 50 MG TABS: 50 | 90 days supply | Qty: 180 | Fill #0

## 2019-02-08 MED FILL — DAYTRANA 10 MG/9 HR PATCH: 10 | 30 days supply | Qty: 30 | Fill #0

## 2019-02-25 MED FILL — L-METHYLFOLATE-ALGAE 15-90.: 15-90.314 | 30 days supply | Qty: 30 | Fill #1

## 2019-02-25 MED FILL — SYNTHROID 25 MCG TABLET: 25 | 90 days supply | Qty: 135 | Fill #0

## 2019-02-27 ENCOUNTER — Telehealth (INDEPENDENT_AMBULATORY_CARE_PROVIDER_SITE_OTHER): Payer: 59 | Admitting: Neurology

## 2019-02-27 ENCOUNTER — Encounter: Payer: Self-pay | Admitting: Neurology

## 2019-02-27 DIAGNOSIS — M48061 Spinal stenosis, lumbar region without neurogenic claudication: Secondary | ICD-10-CM

## 2019-02-27 DIAGNOSIS — Z981 Arthrodesis status: Secondary | ICD-10-CM

## 2019-02-27 DIAGNOSIS — R42 Dizziness and giddiness: Secondary | ICD-10-CM

## 2019-02-27 DIAGNOSIS — R270 Ataxia, unspecified: Secondary | ICD-10-CM | POA: Diagnosis not present

## 2019-02-27 DIAGNOSIS — H8190 Unspecified disorder of vestibular function, unspecified ear: Secondary | ICD-10-CM

## 2019-02-27 NOTE — Patient Instructions (Signed)
Meclizine chewable tablets What is this medicine? MECLIZINE (MEK li zeen) is an antihistamine. It is used to prevent nausea, vomiting, or dizziness caused by motion sickness. It is also used to prevent and treat vertigo (extreme dizziness or a feeling that you or your surroundings are tilting or spinning around). This medicine may be used for other purposes; ask your health care provider or pharmacist if you have questions. COMMON BRAND NAME(S): Bonine, Travel Sickness What should I tell my health care provider before I take this medicine? They need to know if you have any of these conditions:  glaucoma  lung or breathing disease, like asthma  problems urinating  prostate disease  stomach or intestine problems  an unusual or allergic reaction to meclizine, other medicines, foods, dyes, or preservatives  pregnant or trying to get pregnant  breast-feeding How should I use this medicine? Take this medicine by mouth with a glass of water. Chew it completely or swallow whole. Follow the directions on the prescription label. If you are using this medicine to prevent motion sickness, take the dose at least 1 hour before travel. If it upsets your stomach, take it with food or milk. Take your doses at regular intervals. Do not take your medicine more often than directed. Talk to your pediatrician regarding the use of this medicine in children. While this drug may be prescribed for selected conditions, precautions do apply. Overdosage: If you think you have taken too much of this medicine contact a poison control center or emergency room at once. NOTE: This medicine is only for you. Do not share this medicine with others. What if I miss a dose? If you miss a dose, take it as soon as you can. If it is almost time for your next dose, take only that dose. Do not take double or extra doses. What may interact with this medicine? Do not take this medicine with any of the following  medications:  MAOIs like Carbex, Eldepryl, Marplan, Nardil, and Parnate This medicine may also interact with the following medications:  alcohol  antihistamines for allergy, cough and cold  certain medicines for anxiety or sleep  certain medicines for depression, like amitriptyline, fluoxetine, sertraline  certain medicines for seizures like phenobarbital, primidone  general anesthetics like halothane, isoflurane, methoxyflurane, propofol  local anesthetics like lidocaine, pramoxine, tetracaine  medicines that relax muscles for surgery  narcotic medicines for pain  phenothiazines like chlorpromazine, mesoridazine, prochlorperazine, thioridazine This list may not describe all possible interactions. Give your health care provider a list of all the medicines, herbs, non-prescription drugs, or dietary supplements you use. Also tell them if you smoke, drink alcohol, or use illegal drugs. Some items may interact with your medicine. What should I watch for while using this medicine? Tell your doctor or healthcare professional if your symptoms do not start to get better or if they get worse. You may get drowsy or dizzy. Do not drive, use machinery, or do anything that needs mental alertness until you know how this medicine affects you. Do not stand or sit up quickly, especially if you are an older patient. This reduces the risk of dizzy or fainting spells. Alcohol may interfere with the effect of this medicine. Avoid alcoholic drinks. Your mouth may get dry. Chewing sugarless gum or sucking hard candy, and drinking plenty of water may help. Contact your doctor if the problem does not go away or is severe. This medicine may cause dry eyes and blurred vision. If you wear contact lenses  you may feel some discomfort. Lubricating drops may help. See your eye doctor if the problem does not go away or is severe. What side effects may I notice from receiving this medicine? Side effects that you should  report to your doctor or health care professional as soon as possible:  feeling faint or lightheaded, falls  fast, irregular heartbeat Side effects that usually do not require medical attention (report to your doctor or health care professional if they continue or are bothersome):  constipation  headache  trouble passing urine or change in the amount of urine  trouble sleeping  upset stomach This list may not describe all possible side effects. Call your doctor for medical advice about side effects. You may report side effects to FDA at 1-800-FDA-1088. Where should I keep my medicine? Keep out of the reach of children. Store at room temperature between 15 and 30 degrees C (59 and 86 degrees F). Keep container tightly closed. Throw away any unused medicine after the expiration date. NOTE: This sheet is a summary. It may not cover all possible information. If you have questions about this medicine, talk to your doctor, pharmacist, or health care provider.  2020 Elsevier/Gold Standard (2015-04-30 10:44:29)

## 2019-02-27 NOTE — Progress Notes (Signed)
Virtual Visit via Video Note  I connected with Rita Gregory on 02/27/19 at  2:00 PM EST by telephone and verified that I am speaking with the correct person using two identifiers.  Location: Patient: at her home  Provider: at [facility]   I discussed the limitations, risks, security and privacy concerns of performing an evaluation and management service by telephone and the availability of in person appointments. I also discussed with the patient that there may be a patient responsible charge related to this service. The patient expressed understanding and agreed to proceed.   History of Present Illness:  Please review the last visit notes with Rita Gregory for details, the patient reported that she has been in quarantine she has gained weight she recently developed labored breathing of the feeling that she cannot take a deep breath.  She had tested negative for Covid and a recent test and continues to sleep in a separate bedroom from her husband who also tested negative.  She has a headache disorder that responds to Afrin nasal spray and meclizine which she buys over-the-counter at the 12.5 doses.  I have tried Periactin to treat her headaches but that did not work out for her.  She also feels that she has now to sit or recline slightly in bed to allow her to breathe easier this is a fairly new component of her complaints.  She has a history of vertigo which is well controlled, she had spells that I have described in my previous encounters with her.   Observations/Objective:  We were able to see each other on video before continuing audio visit only.  The patient has no facial asymmetry, her voice is slightly hoarse but this has been her baseline, her memory appears to be intact she acknowledges some level of depression especially during this isolating time in the Covid Pandemic. She has degenerative joint disease affecting her ankle, knee hip and also some back and neck pain.  She is less  physically active but she does twice a week yoga by Zoom.  She has continued to control her blood sugar and blood pressure very well she has postponed a visit with ENT who will have to weigh in and concerns to the Afrin use.  This visit was likely have to be a face-to-face.  I also encouraged her that if shortness of breath worsens or continues she should start to talk to a pulmonologist and may consider a diaphragmatic function test at as well as a pulmonary function test.  It certainly helps that she knows that she is Covid negative.  Assessment and Plan: She has continued to control her blood sugar and blood pressure very well she has postponed a visit with ENT who will have to weigh in and concerns to the Afrin use.    This visit was likely have to be a face-to-face.  I also encouraged her that if shortness of breath worsens or continues she should start to talk to a pulmonologist and may consider a diaphragmatic function test at as well as a pulmonary function test.  It certainly helps that she knows that she is Covid negative.    Follow Up Instructions: Rv by Video or Phone in 6 month , can be with NP.     I discussed the assessment and treatment plan with the patient. The patient was provided an opportunity to ask questions and all were answered. The patient agreed with the plan and demonstrated an understanding of the instructions.  The patient was advised to call back or seek an in-person evaluation if the symptoms worsen or if the condition fails to improve as anticipated.  I provided 15 minutes of non-face-to-face time during this encounter.   Larey Seat, MD

## 2019-03-04 DIAGNOSIS — N951 Menopausal and female climacteric states: Secondary | ICD-10-CM | POA: Diagnosis not present

## 2019-03-04 DIAGNOSIS — E559 Vitamin D deficiency, unspecified: Secondary | ICD-10-CM | POA: Diagnosis not present

## 2019-03-04 DIAGNOSIS — E782 Mixed hyperlipidemia: Secondary | ICD-10-CM | POA: Diagnosis not present

## 2019-03-04 DIAGNOSIS — E039 Hypothyroidism, unspecified: Secondary | ICD-10-CM | POA: Diagnosis not present

## 2019-03-04 DIAGNOSIS — R7302 Impaired glucose tolerance (oral): Secondary | ICD-10-CM | POA: Diagnosis not present

## 2019-03-04 DIAGNOSIS — K219 Gastro-esophageal reflux disease without esophagitis: Secondary | ICD-10-CM | POA: Diagnosis not present

## 2019-03-04 DIAGNOSIS — M797 Fibromyalgia: Secondary | ICD-10-CM | POA: Diagnosis not present

## 2019-03-04 DIAGNOSIS — M159 Polyosteoarthritis, unspecified: Secondary | ICD-10-CM | POA: Diagnosis not present

## 2019-03-04 DIAGNOSIS — I10 Essential (primary) hypertension: Secondary | ICD-10-CM | POA: Diagnosis not present

## 2019-03-06 ENCOUNTER — Other Ambulatory Visit: Payer: Self-pay | Admitting: Neurology

## 2019-03-06 DIAGNOSIS — E46 Unspecified protein-calorie malnutrition: Secondary | ICD-10-CM | POA: Diagnosis not present

## 2019-03-06 DIAGNOSIS — H53483 Generalized contraction of visual field, bilateral: Secondary | ICD-10-CM | POA: Diagnosis not present

## 2019-03-06 DIAGNOSIS — R7302 Impaired glucose tolerance (oral): Secondary | ICD-10-CM | POA: Diagnosis not present

## 2019-03-06 DIAGNOSIS — I10 Essential (primary) hypertension: Secondary | ICD-10-CM | POA: Diagnosis not present

## 2019-03-06 DIAGNOSIS — E782 Mixed hyperlipidemia: Secondary | ICD-10-CM | POA: Diagnosis not present

## 2019-03-06 DIAGNOSIS — R42 Dizziness and giddiness: Secondary | ICD-10-CM | POA: Diagnosis not present

## 2019-03-06 DIAGNOSIS — E038 Other specified hypothyroidism: Secondary | ICD-10-CM | POA: Diagnosis not present

## 2019-03-06 DIAGNOSIS — R29898 Other symptoms and signs involving the musculoskeletal system: Secondary | ICD-10-CM | POA: Diagnosis not present

## 2019-03-06 DIAGNOSIS — R5383 Other fatigue: Secondary | ICD-10-CM | POA: Diagnosis not present

## 2019-03-06 DIAGNOSIS — E11649 Type 2 diabetes mellitus with hypoglycemia without coma: Secondary | ICD-10-CM | POA: Diagnosis not present

## 2019-03-06 DIAGNOSIS — D509 Iron deficiency anemia, unspecified: Secondary | ICD-10-CM | POA: Diagnosis not present

## 2019-03-12 LAB — ACETYLCHOLINE RECEPTOR, MODULATING: Acetylchol Modul Ab: 20 % Inhibition

## 2019-03-13 MED FILL — RESTASIS 0.05% EYE EMULSION: 0.05 | 30 days supply | Qty: 60 | Fill #1

## 2019-03-13 MED FILL — DAYTRANA 15 MG/9 HR PATCH: 15 | 30 days supply | Qty: 30 | Fill #0

## 2019-03-21 ENCOUNTER — Encounter: Payer: Self-pay | Admitting: Neurology

## 2019-03-29 MED FILL — ALBUTEROL SULFATE HFA 108 (: 108 (90 BAS | 17 days supply | Qty: 18 | Fill #1

## 2019-03-29 MED FILL — EPINEPHRINE 0.3 MG AUTO-INJ: 0.3 | 15 days supply | Qty: 2 | Fill #1

## 2019-03-29 MED FILL — PRAVASTATIN NA 40 MG TAB: 40 | 90 days supply | Qty: 90 | Fill #0

## 2019-03-29 MED FILL — L-METHYLFOLATE-ALGAE 15-90.: 15-90.314 | 30 days supply | Qty: 30 | Fill #2

## 2019-04-01 MED FILL — TRIAMCINOLONE ACETONIDE 0.1: 0.1 | 30 days supply | Qty: 80 | Fill #0

## 2019-04-02 MED FILL — PREMARIN 0.45 MG TABLET: 0.45 | 90 days supply | Qty: 90 | Fill #0

## 2019-04-09 MED FILL — RESTASIS 0.05% EYE EMULSION: 0.05 | 30 days supply | Qty: 60 | Fill #2

## 2019-04-10 MED FILL — DAYTRANA 15 MG/9 HR PATCH: 15 | 30 days supply | Qty: 30 | Fill #0

## 2019-04-11 MED FILL — FREESTYLE LANCETS: 90 days supply | Qty: 200 | Fill #0

## 2019-04-11 MED FILL — FREESTYLE LITE METER: 30 days supply | Qty: 1 | Fill #0

## 2019-04-11 MED FILL — FREESTYLE LITE TEST STRIP: 90 days supply | Qty: 200 | Fill #0

## 2019-04-17 DIAGNOSIS — L814 Other melanin hyperpigmentation: Secondary | ICD-10-CM | POA: Diagnosis not present

## 2019-04-17 DIAGNOSIS — L821 Other seborrheic keratosis: Secondary | ICD-10-CM | POA: Diagnosis not present

## 2019-04-17 DIAGNOSIS — L304 Erythema intertrigo: Secondary | ICD-10-CM | POA: Diagnosis not present

## 2019-04-26 DIAGNOSIS — F900 Attention-deficit hyperactivity disorder, predominantly inattentive type: Secondary | ICD-10-CM | POA: Diagnosis not present

## 2019-04-26 DIAGNOSIS — G894 Chronic pain syndrome: Secondary | ICD-10-CM | POA: Diagnosis not present

## 2019-04-26 MED FILL — L-METHYLFOLATE-ALGAE 15-90.: 15-90.314 | 30 days supply | Qty: 30 | Fill #0

## 2019-05-03 DIAGNOSIS — H04123 Dry eye syndrome of bilateral lacrimal glands: Secondary | ICD-10-CM | POA: Diagnosis not present

## 2019-05-21 MED FILL — DAYTRANA 15 MG/9 HR PATCH: 15 | 30 days supply | Qty: 30 | Fill #0

## 2019-05-21 MED FILL — SYNTHROID 25 MCG TABLET: 25 | 90 days supply | Qty: 135 | Fill #1

## 2019-05-24 ENCOUNTER — Ambulatory Visit: Payer: 59 | Attending: Internal Medicine

## 2019-05-24 DIAGNOSIS — Z23 Encounter for immunization: Secondary | ICD-10-CM | POA: Insufficient documentation

## 2019-05-24 NOTE — Progress Notes (Signed)
   Covid-19 Vaccination Clinic  Name:  Rita Gregory    MRN: [id number] DOB: 15-Feb-1946  05/24/2019  [name] was observed post Covid-19 immunization for 30 minutes based on pre-vaccination screening without incidence. She was provided with Vaccine Information Sheet and instruction to access the V-Safe system.   [name] was instructed to call 911 with any severe reactions post vaccine: Marland Kitchen Difficulty breathing  . Swelling of your face and throat  . A fast heartbeat  . A bad rash all over your body  . Dizziness and weakness    Immunizations Administered    Name Date Dose VIS Date Route   Pfizer COVID-19 Vaccine 05/24/2019  3:09 PM 0.3 mL 03/22/2019 Intramuscular   Manufacturer: [place]   Lot: [id number]   [place]: [id number]

## 2019-06-10 DIAGNOSIS — M159 Polyosteoarthritis, unspecified: Secondary | ICD-10-CM | POA: Diagnosis not present

## 2019-06-10 DIAGNOSIS — N951 Menopausal and female climacteric states: Secondary | ICD-10-CM | POA: Diagnosis not present

## 2019-06-10 DIAGNOSIS — E039 Hypothyroidism, unspecified: Secondary | ICD-10-CM | POA: Diagnosis not present

## 2019-06-10 DIAGNOSIS — R7302 Impaired glucose tolerance (oral): Secondary | ICD-10-CM | POA: Diagnosis not present

## 2019-06-10 DIAGNOSIS — M797 Fibromyalgia: Secondary | ICD-10-CM | POA: Diagnosis not present

## 2019-06-10 DIAGNOSIS — E559 Vitamin D deficiency, unspecified: Secondary | ICD-10-CM | POA: Diagnosis not present

## 2019-06-10 DIAGNOSIS — E782 Mixed hyperlipidemia: Secondary | ICD-10-CM | POA: Diagnosis not present

## 2019-06-10 DIAGNOSIS — K219 Gastro-esophageal reflux disease without esophagitis: Secondary | ICD-10-CM | POA: Diagnosis not present

## 2019-06-10 DIAGNOSIS — I10 Essential (primary) hypertension: Secondary | ICD-10-CM | POA: Diagnosis not present

## 2019-06-10 MED FILL — L-METHYLFOLATE-ALGAE 15-90.: 15-90.314 | 30 days supply | Qty: 30 | Fill #1

## 2019-06-10 MED FILL — ALBUTEROL SULFATE HFA 108 (: 108 (90 BAS | 16 days supply | Qty: 18 | Fill #0

## 2019-06-10 MED FILL — BENZONATATE 100 MG CAPS: 100 | 10 days supply | Qty: 30 | Fill #0

## 2019-06-14 ENCOUNTER — Other Ambulatory Visit (HOSPITAL_COMMUNITY): Payer: Self-pay | Admitting: Obstetrics and Gynecology

## 2019-06-14 DIAGNOSIS — Z01419 Encounter for gynecological examination (general) (routine) without abnormal findings: Secondary | ICD-10-CM | POA: Diagnosis not present

## 2019-06-14 DIAGNOSIS — R6882 Decreased libido: Secondary | ICD-10-CM | POA: Diagnosis not present

## 2019-06-14 DIAGNOSIS — N907 Vulvar cyst: Secondary | ICD-10-CM | POA: Diagnosis not present

## 2019-06-14 DIAGNOSIS — N951 Menopausal and female climacteric states: Secondary | ICD-10-CM | POA: Diagnosis not present

## 2019-06-16 ENCOUNTER — Ambulatory Visit: Payer: 59 | Attending: Internal Medicine

## 2019-06-16 DIAGNOSIS — Z23 Encounter for immunization: Secondary | ICD-10-CM | POA: Insufficient documentation

## 2019-06-16 NOTE — Progress Notes (Signed)
   Covid-19 Vaccination Clinic  Name:  Rita Gregory    MRN: [id number] DOB: 1945/07/17  06/16/2019  Ms. Rita Gregory was observed post Covid-19 immunization for 15 minutes without incident. She was provided with Vaccine Information Sheet and instruction to access the V-Safe system.   Ms. Rita Gregory was instructed to call 911 with any severe reactions post vaccine: Marland Kitchen Difficulty breathing  . Swelling of face and throat  . A fast heartbeat  . A bad rash all over body  . Dizziness and weakness   Immunizations Administered    Name Date Dose VIS Date Route   Pfizer COVID-19 Vaccine 06/16/2019  3:07 PM 0.3 mL 03/22/2019 Intramuscular   Manufacturer: [place]   Lot: [id number]   [place]: [id number]

## 2019-06-17 MED FILL — PREMARIN 0.45 MG TABLET: 0.45 | 90 days supply | Qty: 90 | Fill #0

## 2019-06-20 DIAGNOSIS — H5310 Unspecified subjective visual disturbances: Secondary | ICD-10-CM | POA: Diagnosis not present

## 2019-06-20 DIAGNOSIS — H04123 Dry eye syndrome of bilateral lacrimal glands: Secondary | ICD-10-CM | POA: Diagnosis not present

## 2019-06-25 DIAGNOSIS — F411 Generalized anxiety disorder: Secondary | ICD-10-CM | POA: Diagnosis not present

## 2019-06-25 DIAGNOSIS — F900 Attention-deficit hyperactivity disorder, predominantly inattentive type: Secondary | ICD-10-CM | POA: Diagnosis not present

## 2019-06-25 MED FILL — DAYTRANA 15 MG/9 HR PATCH: 15 | 30 days supply | Qty: 30 | Fill #0

## 2019-07-01 ENCOUNTER — Other Ambulatory Visit (HOSPITAL_COMMUNITY): Payer: Self-pay | Admitting: Endocrinology

## 2019-07-01 MED FILL — DEXCOM G6 TRANSMITTER MISC: 90 days supply | Qty: 1 | Fill #0

## 2019-07-01 MED FILL — DEXCOM G6 RECEIVER DEVI: 30 days supply | Qty: 1 | Fill #0

## 2019-07-01 MED FILL — DEXCOM G6 SENSOR MISC: 30 days supply | Qty: 3 | Fill #0

## 2019-07-05 MED FILL — PRAVASTATIN NA 40 MG TAB: 40 | 90 days supply | Qty: 90 | Fill #0

## 2019-07-05 MED FILL — DAYTRANA 10 MG/9 HR PATCH: 10 | 30 days supply | Qty: 30 | Fill #0

## 2019-07-08 MED FILL — L-METHYLFOLATE-ALGAE 15-90.: 15-90.314 | 30 days supply | Qty: 30 | Fill #2

## 2019-08-05 MED FILL — DAYTRANA 15 MG/9 HR PATCH: 15 | 30 days supply | Qty: 30 | Fill #0

## 2019-08-05 MED FILL — L-METHYLFOLATE-ALGAE 15-90.: 15-90.314 | 30 days supply | Qty: 30 | Fill #3

## 2019-08-26 MED FILL — SYNTHROID 25 MCG TABLET: 25 | 90 days supply | Qty: 135 | Fill #2

## 2019-10-02 ENCOUNTER — Other Ambulatory Visit: Payer: Self-pay | Admitting: Endocrinology

## 2019-10-02 DIAGNOSIS — Z1231 Encounter for screening mammogram for malignant neoplasm of breast: Secondary | ICD-10-CM

## 2019-10-17 ENCOUNTER — Other Ambulatory Visit (HOSPITAL_COMMUNITY): Payer: Self-pay | Admitting: Psychiatry

## 2019-10-17 DIAGNOSIS — F900 Attention-deficit hyperactivity disorder, predominantly inattentive type: Secondary | ICD-10-CM | POA: Diagnosis not present

## 2019-10-17 DIAGNOSIS — G894 Chronic pain syndrome: Secondary | ICD-10-CM | POA: Diagnosis not present

## 2019-10-17 MED FILL — DAYTRANA 15 MG/9 HR PATCH: 15 | 30 days supply | Qty: 30 | Fill #0

## 2019-10-17 MED FILL — DAYTRANA 10 MG/9 HR PATCH: 10 | 30 days supply | Qty: 30 | Fill #0

## 2019-10-17 MED FILL — L-METHYLFOLATE-ALGAE 15-90.: 15-90.314 | 30 days supply | Qty: 30 | Fill #0

## 2019-10-18 MED FILL — PREMARIN 0.45 MG TABLET: 0.45 | 90 days supply | Qty: 90 | Fill #1

## 2019-10-18 MED FILL — PRAVASTATIN NA 40 MG TAB: 40 | 90 days supply | Qty: 90 | Fill #1

## 2019-10-24 DIAGNOSIS — E782 Mixed hyperlipidemia: Secondary | ICD-10-CM | POA: Diagnosis not present

## 2019-10-24 DIAGNOSIS — I10 Essential (primary) hypertension: Secondary | ICD-10-CM | POA: Diagnosis not present

## 2019-10-24 DIAGNOSIS — E038 Other specified hypothyroidism: Secondary | ICD-10-CM | POA: Diagnosis not present

## 2019-10-24 DIAGNOSIS — R7302 Impaired glucose tolerance (oral): Secondary | ICD-10-CM | POA: Diagnosis not present

## 2019-10-24 DIAGNOSIS — D509 Iron deficiency anemia, unspecified: Secondary | ICD-10-CM | POA: Diagnosis not present

## 2019-11-01 DIAGNOSIS — E782 Mixed hyperlipidemia: Secondary | ICD-10-CM | POA: Diagnosis not present

## 2019-11-01 DIAGNOSIS — K219 Gastro-esophageal reflux disease without esophagitis: Secondary | ICD-10-CM | POA: Diagnosis not present

## 2019-11-01 DIAGNOSIS — K5790 Diverticulosis of intestine, part unspecified, without perforation or abscess without bleeding: Secondary | ICD-10-CM | POA: Diagnosis not present

## 2019-11-01 DIAGNOSIS — M5137 Other intervertebral disc degeneration, lumbosacral region: Secondary | ICD-10-CM | POA: Diagnosis not present

## 2019-11-01 DIAGNOSIS — E559 Vitamin D deficiency, unspecified: Secondary | ICD-10-CM | POA: Diagnosis not present

## 2019-11-01 DIAGNOSIS — I10 Essential (primary) hypertension: Secondary | ICD-10-CM | POA: Diagnosis not present

## 2019-11-01 DIAGNOSIS — R110 Nausea: Secondary | ICD-10-CM | POA: Diagnosis not present

## 2019-11-01 DIAGNOSIS — M797 Fibromyalgia: Secondary | ICD-10-CM | POA: Diagnosis not present

## 2019-11-01 DIAGNOSIS — M419 Scoliosis, unspecified: Secondary | ICD-10-CM | POA: Diagnosis not present

## 2019-11-01 DIAGNOSIS — E039 Hypothyroidism, unspecified: Secondary | ICD-10-CM | POA: Diagnosis not present

## 2019-11-01 DIAGNOSIS — M479 Spondylosis, unspecified: Secondary | ICD-10-CM | POA: Diagnosis not present

## 2019-11-01 DIAGNOSIS — R7301 Impaired fasting glucose: Secondary | ICD-10-CM | POA: Diagnosis not present

## 2019-11-08 MED FILL — traZODone HCL 50 MG TABS: 50 | 90 days supply | Qty: 180 | Fill #0

## 2019-11-18 MED FILL — L-METHYLFOLATE-ALGAE 15-90.: 15-90.314 | 30 days supply | Qty: 30 | Fill #1

## 2019-11-18 MED FILL — traZODone HCL 50 MG TABS: 50 | 90 days supply | Qty: 180 | Fill #0

## 2019-11-18 MED FILL — DAYTRANA 15 MG/9 HR PATCH: 15 | 30 days supply | Qty: 30 | Fill #0

## 2019-11-18 MED FILL — SYNTHROID 25 MCG TABLET: 25 | 90 days supply | Qty: 135 | Fill #0

## 2019-11-25 DIAGNOSIS — M19011 Primary osteoarthritis, right shoulder: Secondary | ICD-10-CM | POA: Diagnosis not present

## 2019-11-25 DIAGNOSIS — M25551 Pain in right hip: Secondary | ICD-10-CM | POA: Diagnosis not present

## 2019-11-25 DIAGNOSIS — M1712 Unilateral primary osteoarthritis, left knee: Secondary | ICD-10-CM | POA: Diagnosis not present

## 2019-11-25 DIAGNOSIS — M25552 Pain in left hip: Secondary | ICD-10-CM | POA: Diagnosis not present

## 2019-11-25 DIAGNOSIS — Z96651 Presence of right artificial knee joint: Secondary | ICD-10-CM | POA: Diagnosis not present

## 2019-11-25 DIAGNOSIS — M19012 Primary osteoarthritis, left shoulder: Secondary | ICD-10-CM | POA: Diagnosis not present

## 2019-11-27 DIAGNOSIS — H5310 Unspecified subjective visual disturbances: Secondary | ICD-10-CM | POA: Diagnosis not present

## 2019-11-28 ENCOUNTER — Encounter: Payer: Self-pay | Admitting: Internal Medicine

## 2019-11-28 ENCOUNTER — Ambulatory Visit (INDEPENDENT_AMBULATORY_CARE_PROVIDER_SITE_OTHER): Payer: 59 | Admitting: Internal Medicine

## 2019-11-28 VITALS — BP 118/70 | HR 76 | Ht 59.5 in | Wt 176.4 lb

## 2019-11-28 DIAGNOSIS — G8929 Other chronic pain: Secondary | ICD-10-CM

## 2019-11-28 DIAGNOSIS — Z86010 Personal history of colonic polyps: Secondary | ICD-10-CM

## 2019-11-28 DIAGNOSIS — R1013 Epigastric pain: Secondary | ICD-10-CM | POA: Diagnosis not present

## 2019-11-28 DIAGNOSIS — R110 Nausea: Secondary | ICD-10-CM

## 2019-11-28 DIAGNOSIS — H04123 Dry eye syndrome of bilateral lacrimal glands: Secondary | ICD-10-CM | POA: Diagnosis not present

## 2019-11-28 DIAGNOSIS — K219 Gastro-esophageal reflux disease without esophagitis: Secondary | ICD-10-CM | POA: Diagnosis not present

## 2019-11-28 DIAGNOSIS — M6208 Separation of muscle (nontraumatic), other site: Secondary | ICD-10-CM

## 2019-11-28 MED ORDER — PANTOPRAZOLE SODIUM 40 MG PO TBEC: 40.0000 mg | DELAYED_RELEASE_TABLET | Freq: Every day | ORAL | 3 refills | Status: DC

## 2019-11-28 MED ORDER — ONDANSETRON HCL 4 MG PO TABS: 4.0000 mg | ORAL_TABLET | ORAL | 3 refills | Status: DC | PRN

## 2019-11-28 MED FILL — ONDANSETRON HCL 4 MG TABLET: 4 | 5 days supply | Qty: 30 | Fill #0

## 2019-11-28 MED FILL — PANTOPRAZOLE SOD DR 40 MG T: 40 | 90 days supply | Qty: 90 | Fill #0

## 2019-11-28 NOTE — Patient Instructions (Signed)
We have sent the following medications to your pharmacy for you to pick up at your convenience:  Pantoprazole, Zofran.  Please call us to schedule your endoscopy/colonoscopy

## 2019-11-28 NOTE — Progress Notes (Signed)
HISTORY OF PRESENT ILLNESS:  Rita Gregory is a pleasant 74 y.o. 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  Patient has been seen previously in this office, but not since 2013.  As a way of review, she did undergo complete colonoscopy November 09, 2007 to evaluate constipation and hematochezia.  The examination revealed sigmoid diverticulosis.  In addition, a 12 mm sessile polyp was removed from the right colon.  This was hyperplastic, but because of the size, follow-up in 5 years recommended.  She did receive a recall letter, but has not had follow-up surveillance colonoscopy.  She also underwent upper endoscopy February 23, 2012 to evaluate epigastric pain.  The examination was normal.  She does have chronic reflux for which she has been alternating Nexium 20 mg daily with Prevacid 30 mg daily.  She has had significant weight gain through Covid.  22 pounds since her last office visit here, and at least 10 pounds over the past year per her outside office records.  With this she has noticed regurgitation and worsening reflux with bending forward.  No dysphagia.  Nausea has been prominent at times, but no vomiting.  She does have occasional loose stools and intermittent constipation, but no dramatic change from baseline.  No GI bleeding.  She has completed her Covid vaccination series.  She does have close primary care supervision with Dr. Legrand Como [place].  She has seen him recently.  I do have a note from March 2021 where she was evaluated via telehealth medicine.  No relevant laboratory abnormalities.  REVIEW OF SYSTEMS:  All non-GI ROS negative unless otherwise stated in the HPI except for sinus and allergy, anxiety, arthritis, back pain, visual change, depression, fatigue, headaches, itching, muscle  cramps, shortness of breath, skin rash  Past Medical History:  Diagnosis Date  . Arthritis of left acromioclavicular joint   . Cervical disc disorder with left arm radiculopathy of cervical region 04/20/2017  . Chronic pain    Dr. Ellene Route follows  . Colon polyp 11/09/2007   Hyperplastic   . Depression   . Diverticulosis of colon (without mention of hemorrhage) 11/09/2007  . Esophageal reflux   . Fibromyalgia   . Hypertension    in the past  . Hypothyroidism   . Kyphoscoliosis    40 degree bend ro right side  . Paroxysmal spells 02/13/2013  . Patent foramen ovale    hx.- Dr. Lemmie Evens. Smith,cardiology- follows  . Pre-diabetes    Controls with diet  . Rotator cuff tear, non-traumatic, left   . Shoulder impingement, left     Past Surgical History:  Procedure Laterality Date  . ABDOMINAL HYSTERECTOMY    . ANTERIOR LATERAL LUMBAR FUSION 4 LEVELS Left 06/16/2015   Procedure: ANTERIOR LATERAL LUMBAR FUSION THORACIC TWELVE-LUMBAR FOUR;  Surgeon: Kristeen Miss, MD;  Location: [place] [facility];  Service: Neurosurgery;  Laterality: Left;  Thoracolumbar spine  . APPENDECTOMY    . CESAREAN SECTION     x2  . CHOLECYSTECTOMY    . dry eyes     uses Restasis daily  . ESOPHAGOGASTRODUODENOSCOPY  02/23/2012   Procedure: ESOPHAGOGASTRODUODENOSCOPY (EGD);  Surgeon: Irene Shipper, MD;  Location: [facility];  Service: Endoscopy;  Laterality: N/A;  . JOINT REPLACEMENT     RTKA '05  . KNEE ARTHROSCOPY W/ ACL RECONSTRUCTION     Rt. Knee  . POSTERIOR  LUMBAR FUSION 4 LEVEL N/A 06/16/2015   Procedure: POSTERIOR LUMBAR FUSION LUMBAR FOUR-FIVE LUMBAR FIVE-SACRAL ONE;  Surgeon: Kristeen Miss, MD;  Location: [place] [facility];  Service: Neurosurgery;  Laterality: N/A;  . RADIOLOGY WITH ANESTHESIA N/A 06/15/2015   Procedure: RADIOLOGY WITH ANESTHESIA;  Surgeon: Medication Radiologist, MD;  Location: [facility];  Service: Radiology;  Laterality: N/A;  . right rotator cuff  2011   sx  . SHOULDER ARTHROSCOPY Left 04/28/2017    Procedure: ARTHROSCOPY SHOULDER WITH EXTENSIVE DEBRIDEMENT ROTATOR CUFF TEAR;  Surgeon: Elsie Saas, MD;  Location: [facility];  Service: Orthopedics;  Laterality: Left;    Social History Breanda Greenlaw  reports that she is a non-smoker but has been exposed to tobacco smoke. She has never used smokeless tobacco. She reports current alcohol use. She reports that she does not use drugs.  family history includes Bipolar disorder in her sister and son; Colon cancer in her father; Diabetes type II in her father; Heart disease in her father; Kidney disease in her father; Ovarian cancer in her mother; Stomach cancer in her paternal uncle.  Allergies  Allergen Reactions  . Betadine [Povidone Iodine] Other (See Comments)    Blister hands and feet    . Fentanyl Shortness Of Breath    Pt has tolerated this medication 06/2015 pre and during operation. MD ok giving.  . Morphine And Related Anaphylaxis    respiratory depression   . Bupropion Hcl     Dizzy, fog  . Codeine Nausea And Vomiting  . Demerol [Meperidine] Nausea And Vomiting  . Dextromethorphan Other (See Comments)    Unknown   . Guaifenesin & Derivatives     Other, unknown  . Lipitor [Atorvastatin]     Myalgia  . Lovaza [Omega-3-Acid Ethyl Esters] Diarrhea  . Lyrica [Pregabalin]     Fatigue and depression  . Naltrexone     depression  . Percodan [Oxycodone-Aspirin] Nausea And Vomiting  . Prilosec [Omeprazole]     Iliacs    . Topamax [Topiramate]     disorientation   . Valium [Diazepam]     Small doses are OK but large doses are not. Long-term use causes depression.   . Versed [Midazolam]     In moderate in large doses shortness of breath, and respiratory depression   . Adhesive [Tape] Rash    Skin irritation   . Concerta [Methylphenidate] Palpitations  . Doxycycline Rash    Pruritic rash  . Lamotrigine Rash  . Nickel Rash    Skin irritation   . Oxycodone-Acetaminophen Nausea And Vomiting and  Rash    depression  . Percocet [Oxycodone-Acetaminophen] Nausea And Vomiting and Rash  . Tegaderm Ag Mesh [Silver] Rash       PHYSICAL EXAMINATION: Vital signs: BP 118/70 (BP Location: Left Arm, Patient Position: Sitting)   Pulse 76   Ht 4' 11.5" (1.511 m)   Wt 176 lb 6.4 oz (80 kg)   SpO2 97%   BMI 35.03 kg/m   Constitutional: generally well-appearing, no acute distress Psychiatric: alert and oriented x3, cooperative Eyes: extraocular movements intact, anicteric, conjunctiva pink Mouth: oral pharynx moist, no lesions Neck: supple no lymphadenopathy Cardiovascular: heart regular rate and rhythm, no murmur Lungs: clear to auscultation bilaterally Abdomen: soft, nontender, nondistended, no obvious ascites, no peritoneal signs, normal bowel sounds, no organomegaly.  Small area of diastases abdominis recti versus early ventral hernia below the xiphoid process. Rectal: Deferred to colonoscopy Extremities: no clubbing, cyanosis, or lower extremity edema bilaterally Skin:  no lesions on visible extremities Neuro: No focal deficits.  Cranial nerves intact  ASSESSMENT:  1.  Worsening reflux symptoms.  Likely related to Covid associated weight gain 2.  Nausea.  Same 3.  Abdominal bloating. 4.  Small area of abdominal diastases versus early hernia.  Does not need further attention. 5.  Normal EGD 2013 6.  Large hyperplastic polyp 2009.  Overdue for follow-up   PLAN:  1.  Reflux precautions with attention to weight loss 2.  Stop Nexium 20 mg daily 3.  Prescribe pantoprazole 40 mg daily; #30; 11 refills.  Medication effects and risks reviewed. 4.  Prescribe Zofran 4 mg every 4 hours as needed for nausea.  Refills provided..  Medication effects and risks reviewed. 5.  Schedule upper endoscopy to evaluate worsening reflux symptoms, nausea, and epigastric discomfort.The nature of the procedure, as well as the risks, benefits, and alternatives were carefully and thoroughly reviewed with  the patient. Ample time for discussion and questions allowed. The patient understood, was satisfied, and agreed to proceed. 6.  Schedule colonoscopy for overdue surveillance.The nature of the procedure, as well as the risks, benefits, and alternatives were carefully and thoroughly reviewed with the patient. Ample time for discussion and questions allowed. The patient understood, was satisfied, and agreed to proceed. A total time of 45 minutes was spent preparing to see the patient, reviewing test, obtaining comprehensive history, performing comprehensive physical exam, counseling the patient regarding the above listed issues, ordering medications, ordering advanced procedures, and documenting clinical information in the health record

## 2019-12-09 DIAGNOSIS — M25561 Pain in right knee: Secondary | ICD-10-CM | POA: Diagnosis not present

## 2019-12-09 DIAGNOSIS — M1712 Unilateral primary osteoarthritis, left knee: Secondary | ICD-10-CM | POA: Diagnosis not present

## 2019-12-09 DIAGNOSIS — M5416 Radiculopathy, lumbar region: Secondary | ICD-10-CM | POA: Diagnosis not present

## 2019-12-09 DIAGNOSIS — M25562 Pain in left knee: Secondary | ICD-10-CM | POA: Diagnosis not present

## 2019-12-11 DIAGNOSIS — M25562 Pain in left knee: Secondary | ICD-10-CM | POA: Diagnosis not present

## 2019-12-11 DIAGNOSIS — M25561 Pain in right knee: Secondary | ICD-10-CM | POA: Diagnosis not present

## 2019-12-11 DIAGNOSIS — M1712 Unilateral primary osteoarthritis, left knee: Secondary | ICD-10-CM | POA: Diagnosis not present

## 2019-12-11 DIAGNOSIS — M5416 Radiculopathy, lumbar region: Secondary | ICD-10-CM | POA: Diagnosis not present

## 2019-12-14 DIAGNOSIS — Z23 Encounter for immunization: Secondary | ICD-10-CM | POA: Diagnosis not present

## 2019-12-24 DIAGNOSIS — M25561 Pain in right knee: Secondary | ICD-10-CM | POA: Diagnosis not present

## 2019-12-24 DIAGNOSIS — M25562 Pain in left knee: Secondary | ICD-10-CM | POA: Diagnosis not present

## 2019-12-24 DIAGNOSIS — M5416 Radiculopathy, lumbar region: Secondary | ICD-10-CM | POA: Diagnosis not present

## 2019-12-24 DIAGNOSIS — M1712 Unilateral primary osteoarthritis, left knee: Secondary | ICD-10-CM | POA: Diagnosis not present

## 2019-12-24 MED FILL — L-METHYLFOLATE-ALGAE 15-90.: 15-90.314 | 30 days supply | Qty: 30 | Fill #2

## 2019-12-24 MED FILL — DAYTRANA 15 MG/9 HR PATCH: 15 | 30 days supply | Qty: 30 | Fill #0

## 2019-12-24 MED FILL — DAYTRANA 10 MG/9 HR PATCH: 10 | 30 days supply | Qty: 30 | Fill #0

## 2020-01-01 MED FILL — RESTASIS 0.05% EYE EMULSION: 0.05 | 30 days supply | Qty: 60 | Fill #0

## 2020-01-02 DIAGNOSIS — M5416 Radiculopathy, lumbar region: Secondary | ICD-10-CM | POA: Diagnosis not present

## 2020-01-02 DIAGNOSIS — M1712 Unilateral primary osteoarthritis, left knee: Secondary | ICD-10-CM | POA: Diagnosis not present

## 2020-01-02 DIAGNOSIS — M25562 Pain in left knee: Secondary | ICD-10-CM | POA: Diagnosis not present

## 2020-01-02 DIAGNOSIS — M25561 Pain in right knee: Secondary | ICD-10-CM | POA: Diagnosis not present

## 2020-01-08 DIAGNOSIS — M1712 Unilateral primary osteoarthritis, left knee: Secondary | ICD-10-CM | POA: Diagnosis not present

## 2020-01-08 DIAGNOSIS — M25562 Pain in left knee: Secondary | ICD-10-CM | POA: Diagnosis not present

## 2020-01-08 DIAGNOSIS — M25561 Pain in right knee: Secondary | ICD-10-CM | POA: Diagnosis not present

## 2020-01-08 DIAGNOSIS — M5416 Radiculopathy, lumbar region: Secondary | ICD-10-CM | POA: Diagnosis not present

## 2020-01-10 DIAGNOSIS — M4120 Other idiopathic scoliosis, site unspecified: Secondary | ICD-10-CM | POA: Diagnosis not present

## 2020-01-14 DIAGNOSIS — M1712 Unilateral primary osteoarthritis, left knee: Secondary | ICD-10-CM | POA: Diagnosis not present

## 2020-01-14 DIAGNOSIS — M25561 Pain in right knee: Secondary | ICD-10-CM | POA: Diagnosis not present

## 2020-01-14 DIAGNOSIS — M25562 Pain in left knee: Secondary | ICD-10-CM | POA: Diagnosis not present

## 2020-01-14 DIAGNOSIS — M5416 Radiculopathy, lumbar region: Secondary | ICD-10-CM | POA: Diagnosis not present

## 2020-01-14 MED FILL — DEXCOM G6 SENSOR MISC: 30 days supply | Qty: 3 | Fill #1

## 2020-01-14 MED FILL — PRAVASTATIN NA 40 MG TAB: 40 | 90 days supply | Qty: 90 | Fill #2

## 2020-01-14 MED FILL — FREESTYLE LITE TEST STRIP: 50 days supply | Qty: 100 | Fill #0

## 2020-01-15 ENCOUNTER — Other Ambulatory Visit (HOSPITAL_COMMUNITY): Payer: Self-pay | Admitting: Psychiatry

## 2020-01-15 DIAGNOSIS — F900 Attention-deficit hyperactivity disorder, predominantly inattentive type: Secondary | ICD-10-CM | POA: Diagnosis not present

## 2020-01-15 DIAGNOSIS — G894 Chronic pain syndrome: Secondary | ICD-10-CM | POA: Diagnosis not present

## 2020-01-16 DIAGNOSIS — M1712 Unilateral primary osteoarthritis, left knee: Secondary | ICD-10-CM | POA: Diagnosis not present

## 2020-01-16 DIAGNOSIS — M25561 Pain in right knee: Secondary | ICD-10-CM | POA: Diagnosis not present

## 2020-01-16 DIAGNOSIS — M25562 Pain in left knee: Secondary | ICD-10-CM | POA: Diagnosis not present

## 2020-01-16 DIAGNOSIS — M5416 Radiculopathy, lumbar region: Secondary | ICD-10-CM | POA: Diagnosis not present

## 2020-01-21 DIAGNOSIS — M25562 Pain in left knee: Secondary | ICD-10-CM | POA: Diagnosis not present

## 2020-01-21 DIAGNOSIS — M25561 Pain in right knee: Secondary | ICD-10-CM | POA: Diagnosis not present

## 2020-01-21 DIAGNOSIS — M1712 Unilateral primary osteoarthritis, left knee: Secondary | ICD-10-CM | POA: Diagnosis not present

## 2020-01-21 DIAGNOSIS — M5416 Radiculopathy, lumbar region: Secondary | ICD-10-CM | POA: Diagnosis not present

## 2020-01-23 DIAGNOSIS — M5416 Radiculopathy, lumbar region: Secondary | ICD-10-CM | POA: Diagnosis not present

## 2020-01-23 DIAGNOSIS — M25562 Pain in left knee: Secondary | ICD-10-CM | POA: Diagnosis not present

## 2020-01-23 DIAGNOSIS — M25561 Pain in right knee: Secondary | ICD-10-CM | POA: Diagnosis not present

## 2020-01-23 DIAGNOSIS — M1712 Unilateral primary osteoarthritis, left knee: Secondary | ICD-10-CM | POA: Diagnosis not present

## 2020-01-24 ENCOUNTER — Other Ambulatory Visit (HOSPITAL_COMMUNITY): Payer: Self-pay | Admitting: Ophthalmology

## 2020-01-24 DIAGNOSIS — H04123 Dry eye syndrome of bilateral lacrimal glands: Secondary | ICD-10-CM | POA: Diagnosis not present

## 2020-01-28 DIAGNOSIS — M25561 Pain in right knee: Secondary | ICD-10-CM | POA: Diagnosis not present

## 2020-01-28 DIAGNOSIS — M5416 Radiculopathy, lumbar region: Secondary | ICD-10-CM | POA: Diagnosis not present

## 2020-01-28 DIAGNOSIS — M1712 Unilateral primary osteoarthritis, left knee: Secondary | ICD-10-CM | POA: Diagnosis not present

## 2020-01-28 DIAGNOSIS — M25562 Pain in left knee: Secondary | ICD-10-CM | POA: Diagnosis not present

## 2020-01-29 DIAGNOSIS — Z23 Encounter for immunization: Secondary | ICD-10-CM | POA: Diagnosis not present

## 2020-01-29 MED FILL — PREMARIN 0.45 MG TABLET: 0.45 | 90 days supply | Qty: 90 | Fill #2

## 2020-01-29 MED FILL — DAYTRANA 15 MG/9 HR PATCH: 15 | 30 days supply | Qty: 30 | Fill #0

## 2020-01-29 MED FILL — L-METHYLFOLATE-ALGAE 15-90.: 15-90.314 | 30 days supply | Qty: 30 | Fill #3

## 2020-01-29 MED FILL — DAYTRANA 10 MG/9 HR PATCH: 10 | 30 days supply | Qty: 30 | Fill #0

## 2020-01-30 ENCOUNTER — Telehealth: Payer: Self-pay | Admitting: Internal Medicine

## 2020-02-06 DIAGNOSIS — M1712 Unilateral primary osteoarthritis, left knee: Secondary | ICD-10-CM | POA: Diagnosis not present

## 2020-02-10 ENCOUNTER — Encounter: Payer: Self-pay | Admitting: *Deleted

## 2020-02-11 DIAGNOSIS — M25561 Pain in right knee: Secondary | ICD-10-CM | POA: Diagnosis not present

## 2020-02-11 DIAGNOSIS — M5416 Radiculopathy, lumbar region: Secondary | ICD-10-CM | POA: Diagnosis not present

## 2020-02-11 DIAGNOSIS — M1712 Unilateral primary osteoarthritis, left knee: Secondary | ICD-10-CM | POA: Diagnosis not present

## 2020-02-11 DIAGNOSIS — M25562 Pain in left knee: Secondary | ICD-10-CM | POA: Diagnosis not present

## 2020-02-12 DIAGNOSIS — E559 Vitamin D deficiency, unspecified: Secondary | ICD-10-CM | POA: Diagnosis not present

## 2020-02-12 DIAGNOSIS — E039 Hypothyroidism, unspecified: Secondary | ICD-10-CM | POA: Diagnosis not present

## 2020-02-12 DIAGNOSIS — Q211 Atrial septal defect: Secondary | ICD-10-CM | POA: Diagnosis not present

## 2020-02-12 DIAGNOSIS — R7301 Impaired fasting glucose: Secondary | ICD-10-CM | POA: Diagnosis not present

## 2020-02-12 DIAGNOSIS — I10 Essential (primary) hypertension: Secondary | ICD-10-CM | POA: Diagnosis not present

## 2020-02-12 DIAGNOSIS — E782 Mixed hyperlipidemia: Secondary | ICD-10-CM | POA: Diagnosis not present

## 2020-02-20 DIAGNOSIS — M25561 Pain in right knee: Secondary | ICD-10-CM | POA: Diagnosis not present

## 2020-02-20 DIAGNOSIS — M5416 Radiculopathy, lumbar region: Secondary | ICD-10-CM | POA: Diagnosis not present

## 2020-02-20 DIAGNOSIS — M25562 Pain in left knee: Secondary | ICD-10-CM | POA: Diagnosis not present

## 2020-02-20 DIAGNOSIS — M1712 Unilateral primary osteoarthritis, left knee: Secondary | ICD-10-CM | POA: Diagnosis not present

## 2020-02-26 ENCOUNTER — Other Ambulatory Visit: Payer: Self-pay | Admitting: Internal Medicine

## 2020-02-26 ENCOUNTER — Ambulatory Visit (AMBULATORY_SURGERY_CENTER): Payer: Self-pay | Admitting: *Deleted

## 2020-02-26 ENCOUNTER — Other Ambulatory Visit: Payer: Self-pay

## 2020-02-26 VITALS — Ht 59.5 in | Wt 180.0 lb

## 2020-02-26 DIAGNOSIS — Z86010 Personal history of colonic polyps: Secondary | ICD-10-CM

## 2020-02-26 DIAGNOSIS — K219 Gastro-esophageal reflux disease without esophagitis: Secondary | ICD-10-CM

## 2020-02-26 MED ORDER — SUTAB 1479-225-188 MG PO TABS: 1.0000 | ORAL_TABLET | Freq: Once | ORAL | 0 refills | Status: DC

## 2020-02-26 MED FILL — DAYTRANA 10 MG/9 HR PATCH: 10 | 30 days supply | Qty: 30 | Fill #0

## 2020-02-26 MED FILL — SUTAB 1479-225-188 MG TABS: 1479-225-18 | 1 days supply | Qty: 24 | Fill #0

## 2020-02-26 MED FILL — DEXCOM G6 SENSOR MISC: 30 days supply | Qty: 3 | Fill #2

## 2020-02-26 MED FILL — L-METHYLFOLATE-ALGAE 15-90.: 15-90.314 | 30 days supply | Qty: 30 | Fill #4

## 2020-02-26 MED FILL — SYNTHROID 25 MCG TABLET: 25 | 90 days supply | Qty: 135 | Fill #1

## 2020-02-26 MED FILL — DAYTRANA 15 MG/9 HR PATCH: 15 | 30 days supply | Qty: 30 | Fill #0

## 2020-02-26 MED FILL — XIIDRA 5 % SOLN: 5 | 90 days supply | Qty: 180 | Fill #0

## 2020-02-26 NOTE — Progress Notes (Signed)
Completed covid vaccines 06-16-19\  Pt is aware that care partner will wait in the car during procedure; if they feel like they will be too hot or cold to wait in the car; they may wait in the 4 th floor lobby. Patient is aware to bring only one care partner. We want them to wear a mask (we do not have any that we can provide them), practice social distancing, and we will check their temperatures when they get here.  I did remind the patient that their care partner needs to stay in the parking lot the entire time and have a cell phone available, we will call them when the pt is ready for discharge. Patient will wear mask into building.   Prolonged recovery with hysterectomy with Fentanyl used, difficulty with intubation or hx/fam hx of malignant hyperthermia per pt    No egg or soy allergy  No home oxygen use   No medications for weight loss taken  emmi information given  Pt denies constipation issues    Sutab code put into RX and paper copy given to pt to show pharmacy

## 2020-03-03 DIAGNOSIS — M25562 Pain in left knee: Secondary | ICD-10-CM | POA: Diagnosis not present

## 2020-03-03 DIAGNOSIS — M25561 Pain in right knee: Secondary | ICD-10-CM | POA: Diagnosis not present

## 2020-03-03 DIAGNOSIS — M1712 Unilateral primary osteoarthritis, left knee: Secondary | ICD-10-CM | POA: Diagnosis not present

## 2020-03-03 DIAGNOSIS — M5416 Radiculopathy, lumbar region: Secondary | ICD-10-CM | POA: Diagnosis not present

## 2020-03-10 DIAGNOSIS — M5416 Radiculopathy, lumbar region: Secondary | ICD-10-CM | POA: Diagnosis not present

## 2020-03-10 DIAGNOSIS — M25561 Pain in right knee: Secondary | ICD-10-CM | POA: Diagnosis not present

## 2020-03-10 DIAGNOSIS — M1712 Unilateral primary osteoarthritis, left knee: Secondary | ICD-10-CM | POA: Diagnosis not present

## 2020-03-10 DIAGNOSIS — M25562 Pain in left knee: Secondary | ICD-10-CM | POA: Diagnosis not present

## 2020-03-12 DIAGNOSIS — M1712 Unilateral primary osteoarthritis, left knee: Secondary | ICD-10-CM | POA: Diagnosis not present

## 2020-03-12 DIAGNOSIS — M25561 Pain in right knee: Secondary | ICD-10-CM | POA: Diagnosis not present

## 2020-03-12 DIAGNOSIS — M25562 Pain in left knee: Secondary | ICD-10-CM | POA: Diagnosis not present

## 2020-03-12 DIAGNOSIS — M5416 Radiculopathy, lumbar region: Secondary | ICD-10-CM | POA: Diagnosis not present

## 2020-03-19 MED FILL — DEXCOM G6 TRANSMITTER MISC: 90 days supply | Qty: 1 | Fill #1

## 2020-03-23 ENCOUNTER — Ambulatory Visit (AMBULATORY_SURGERY_CENTER): Payer: 59 | Admitting: Internal Medicine

## 2020-03-23 ENCOUNTER — Encounter: Payer: Self-pay | Admitting: Internal Medicine

## 2020-03-23 ENCOUNTER — Other Ambulatory Visit: Payer: Self-pay

## 2020-03-23 VITALS — BP 133/74 | HR 75 | Temp 98.7°F | Resp 12 | Ht 59.5 in | Wt 180.0 lb

## 2020-03-23 DIAGNOSIS — Z86010 Personal history of colonic polyps: Secondary | ICD-10-CM | POA: Diagnosis not present

## 2020-03-23 DIAGNOSIS — K635 Polyp of colon: Secondary | ICD-10-CM | POA: Diagnosis not present

## 2020-03-23 DIAGNOSIS — R110 Nausea: Secondary | ICD-10-CM

## 2020-03-23 DIAGNOSIS — G8929 Other chronic pain: Secondary | ICD-10-CM

## 2020-03-23 DIAGNOSIS — K219 Gastro-esophageal reflux disease without esophagitis: Secondary | ICD-10-CM | POA: Diagnosis not present

## 2020-03-23 DIAGNOSIS — D123 Benign neoplasm of transverse colon: Secondary | ICD-10-CM

## 2020-03-23 DIAGNOSIS — R109 Unspecified abdominal pain: Secondary | ICD-10-CM | POA: Diagnosis not present

## 2020-03-23 DIAGNOSIS — D122 Benign neoplasm of ascending colon: Secondary | ICD-10-CM

## 2020-03-23 DIAGNOSIS — R1013 Epigastric pain: Secondary | ICD-10-CM

## 2020-03-23 DIAGNOSIS — Z1211 Encounter for screening for malignant neoplasm of colon: Secondary | ICD-10-CM | POA: Diagnosis not present

## 2020-03-23 HISTORY — PX: COLONOSCOPY WITH ESOPHAGOGASTRODUODENOSCOPY (EGD): SHX5779

## 2020-03-23 HISTORY — PX: UPPER GASTROINTESTINAL ENDOSCOPY: SHX188

## 2020-03-23 HISTORY — PX: COLONOSCOPY: SHX174

## 2020-03-23 MED ORDER — SODIUM CHLORIDE 0.9 % IV SOLN: 500.0000 mL | Freq: Once | INTRAVENOUS | Status: DC

## 2020-03-23 NOTE — Progress Notes (Signed)
Lidocaine buffer  robinol antisialogogue A and O x3. Report to RN. Tolerated MAC anesthesia well.Teeth unchanged after procedure.

## 2020-03-23 NOTE — Progress Notes (Signed)
Pt's states no medical or surgical changes since previsit or office visit.  Vital JD  Filtered IV tubing due to hx of PFO, paper tape used.

## 2020-03-23 NOTE — Progress Notes (Signed)
Called to room to assist during endoscopic procedure.  Patient ID and intended procedure confirmed with present staff. Received instructions for my participation in the procedure from the performing physician.  

## 2020-03-23 NOTE — Op Note (Signed)
[facility] Patient Name: Rita Gregory Procedure Date: 03/23/2020 2:00 PM MRN: [id number] Endoscopist: Docia Chuck. Henrene Pastor , MD Age: 74 Referring MD:  Date of Birth: 09/04/1945 Gender: Female Account #: [id number] Procedure:                Upper GI endoscopy Indications:              Epigastric abdominal pain, Esophageal reflux                            symptoms that persist despite appropriate therapy,                            Nausea Medicines:                Monitored Anesthesia Care Procedure:                Pre-Anesthesia Assessment:                           - Prior to the procedure, a History and Physical                            was performed, and patient medications and                            allergies were reviewed. The patient's tolerance of                            previous anesthesia was also reviewed. The risks                            and benefits of the procedure and the sedation                            options and risks were discussed with the patient.                            All questions were answered, and informed consent                            was obtained. Prior Anticoagulants: The patient has                            taken no previous anticoagulant or antiplatelet                            agents. ASA Grade Assessment: II - A patient with                            mild systemic disease. After reviewing the risks                            and benefits, the patient was deemed in  satisfactory condition to undergo the procedure.                           After obtaining informed consent, the endoscope was                            passed under direct vision. Throughout the                            procedure, the patient's blood pressure, pulse, and                            oxygen saturations were monitored continuously. The                            Endoscope was introduced through the mouth, and                             advanced to the second part of duodenum. The upper                            GI endoscopy was accomplished without difficulty.                            The patient tolerated the procedure well. Scope In: Scope Out: Findings:                 The esophagus was normal.                           The stomach was normal.                           The examined duodenum was normal.                           The cardia and gastric fundus were normal on                            retroflexion. Complications:            No immediate complications. Estimated Blood Loss:     Estimated blood loss: none. Impression:               1. GERD                           2. Normal EGD Recommendation:           - Patient has a contact number available for                            emergencies. The signs and symptoms of potential                            delayed complications were discussed with the  patient. Return to normal activities tomorrow.                            Written discharge instructions were provided to the                            patient.                           - Resume previous diet.                           - Continue present medications.                           - Reflux precautions with attention to weight loss Docia Chuck. Henrene Pastor, MD 03/23/2020 2:41:57 PM This report has been signed electronically.

## 2020-03-23 NOTE — Patient Instructions (Signed)
YOU HAD AN ENDOSCOPIC PROCEDURE TODAY AT THE [facility]:   Refer to the procedure report that was given to you for any specific questions about what was found during the examination.  If the procedure report does not answer your questions, please call your gastroenterologist to clarify.  If you requested that your care partner not be given the details of your procedure findings, then the procedure report has been included in a sealed envelope for you to review at your convenience later.  YOU SHOULD EXPECT: Some feelings of bloating in the abdomen. Passage of more gas than usual.  Walking can help get rid of the air that was put into your GI tract during the procedure and reduce the bloating. If you had a lower endoscopy (such as a colonoscopy or flexible sigmoidoscopy) you may notice spotting of blood in your stool or on the toilet paper. If you underwent a bowel prep for your procedure, you may not have a normal bowel movement for a few days.  Please Note:  You might notice some irritation and congestion in your nose or some drainage.  This is from the oxygen used during your procedure.  There is no need for concern and it should clear up in a day or so.  SYMPTOMS TO REPORT IMMEDIATELY:   Following lower endoscopy (colonoscopy or flexible sigmoidoscopy):  Excessive amounts of blood in the stool  Significant tenderness or worsening of abdominal pains  Swelling of the abdomen that is new, acute  Fever of 100F or higher   Following upper endoscopy (EGD)  Vomiting of blood or coffee ground material  New chest pain or pain under the shoulder blades  Painful or persistently difficult swallowing  New shortness of breath  Fever of 100F or higher  Black, tarry-looking stools  For urgent or emergent issues, a gastroenterologist can be reached at any hour by calling [phone]. Do not use MyChart messaging for urgent concerns.    DIET:  We do recommend a small meal at first, but  then you may proceed to your regular diet.  Drink plenty of fluids but you should avoid alcoholic beverages for 24 hours.  ACTIVITY:  You should plan to take it easy for the rest of today and you should NOT DRIVE or use heavy machinery until tomorrow (because of the sedation medicines used during the test).    FOLLOW UP: Our staff will call the number listed on your records 48-72 hours following your procedure to check on you and address any questions or concerns that you may have regarding the information given to you following your procedure. If we do not reach you, we will leave a message.  We will attempt to reach you two times.  During this call, we will ask if you have developed any symptoms of COVID 19. If you develop any symptoms (ie: fever, flu-like symptoms, shortness of breath, cough etc.) before then, please call [phone].  If you test positive for Covid 19 in the 2 weeks post procedure, please call and report this information to us.    If any biopsies were taken you will be contacted by phone or by letter within the next 1-3 weeks.  Please call us at [phone] if you have not heard about the biopsies in 3 weeks.    SIGNATURES/CONFIDENTIALITY: You and/or your care partner have signed paperwork which will be entered into your electronic medical record.  These signatures attest to the fact that that the information above on   your After Visit Summary has been reviewed and is understood.  Full responsibility of the confidentiality of this discharge information lies with you and/or your care-partner. 

## 2020-03-23 NOTE — Op Note (Signed)
[facility] Patient Name: Rita Gregory Procedure Date: 03/23/2020 2:00 PM MRN: [id number] Endoscopist: Docia Chuck. Henrene Pastor , MD Age: 74 Referring MD:  Date of Birth: 07-12-1945 Gender: Female Account #: [id number] Procedure:                Colonoscopy with cold snare polypectomy x 2 Indications:              High risk colon cancer surveillance: Personal                            history of colonic polyps. Right-sided hyperplastic                            polyp 2009 Medicines:                Monitored Anesthesia Care Procedure:                Pre-Anesthesia Assessment:                           - Prior to the procedure, a History and Physical                            was performed, and patient medications and                            allergies were reviewed. The patient's tolerance of                            previous anesthesia was also reviewed. The risks                            and benefits of the procedure and the sedation                            options and risks were discussed with the patient.                            All questions were answered, and informed consent                            was obtained. Prior Anticoagulants: The patient has                            taken no previous anticoagulant or antiplatelet                            agents. ASA Grade Assessment: II - A patient with                            mild systemic disease. After reviewing the risks                            and benefits, the patient was deemed in  satisfactory condition to undergo the procedure.                           After obtaining informed consent, the colonoscope                            was passed under direct vision. Throughout the                            procedure, the patient's blood pressure, pulse, and                            oxygen saturations were monitored continuously. The                            Colonoscope was  introduced through the anus and                            advanced to the the cecum, identified by                            appendiceal orifice and ileocecal valve. The                            terminal ileum, ileocecal valve, appendiceal                            orifice, and rectum were photographed. The quality                            of the bowel preparation was excellent. The                            colonoscopy was performed without difficulty. The                            patient tolerated the procedure well. The bowel                            preparation used was SUPREP via split dose                            instruction. Scope In: 2:12:52 PM Scope Out: 2:28:10 PM Scope Withdrawal Time: 0 hours 12 minutes 7 seconds  Total Procedure Duration: 0 hours 15 minutes 18 seconds  Findings:                 The terminal ileum appeared normal.                           Two polyps were found in the transverse colon and                            ascending colon. The polyps were 3 to 7 mm in size.  These polyps were removed with a cold snare.                            Resection and retrieval were complete.                           Multiple diverticula were found in the left colon                            and right colon.                           The exam was otherwise without abnormality on                            direct and retroflexion views. Complications:            No immediate complications. Estimated blood loss:                            None. Estimated Blood Loss:     Estimated blood loss: none. Impression:               - Two 3 to 7 mm polyps in the transverse colon and                            in the ascending colon, removed with a cold snare.                            Resected and retrieved.                           - Diverticulosis in the left colon and in the right                            colon. Normal terminal ileum                            - The examination was otherwise normal on direct                            and retroflexion views. Recommendation:           - Repeat colonoscopy date, if any, to be determined                            after pending pathology results are reviewed for                            surveillance.                           - Patient has a contact number available for                            emergencies. The signs and symptoms of potential  delayed complications were discussed with the                            patient. Return to normal activities tomorrow.                            Written discharge instructions were provided to the                            patient.                           - Resume previous diet.                           - Continue present medications.                           - Await pathology results. Docia Chuck. Henrene Pastor, MD 03/23/2020 2:34:19 PM This report has been signed electronically.

## 2020-03-25 ENCOUNTER — Telehealth: Payer: Self-pay

## 2020-03-25 ENCOUNTER — Telehealth: Payer: Self-pay | Admitting: *Deleted

## 2020-03-25 NOTE — Telephone Encounter (Signed)
Follow up call, without answer. No voicemail.

## 2020-03-25 NOTE — Telephone Encounter (Signed)
°  Follow up Call-  Call back number 03/23/2020  Post procedure Call Back phone  # [phone]  Permission to leave phone message Yes  Some recent data might be hidden     Patient questions:  Do you have a fever, pain , or abdominal swelling? No. Pain Score  0 *  Have you tolerated food without any problems? Yes.    Have you been able to return to your normal activities? Yes.    Do you have any questions about your discharge instructions: Diet   No. Medications  No. Follow up visit  No.  Do you have questions or concerns about your Care? No.  Actions: * If pain score is 4 or above: No action needed, pain <4.  1. Have you developed a fever since your procedure? no  2.   Have you had an respiratory symptoms (SOB or cough) since your procedure? no  3.   Have you tested positive for COVID 19 since your procedure no  4.   Have you had any family members/close contacts diagnosed with the COVID 19 since your procedure?  no   If yes to any of these questions please route to Joylene John, RN and Joella Prince, RN

## 2020-03-26 ENCOUNTER — Other Ambulatory Visit (HOSPITAL_COMMUNITY): Payer: Self-pay | Admitting: Endocrinology

## 2020-03-26 MED FILL — L-METHYLFOLATE-ALGAE 15-90.: 15-90.314 | 30 days supply | Qty: 30 | Fill #5

## 2020-03-26 MED FILL — EPINEPHRINE 0.3 MG AUTO-INJ: 0.3 | 2 days supply | Qty: 2 | Fill #0

## 2020-03-26 MED FILL — DAYTRANA 10 MG/9 HR PATCH: 10 | 30 days supply | Qty: 30 | Fill #0

## 2020-03-26 MED FILL — DAYTRANA 15 MG/9 HR PATCH: 15 | 30 days supply | Qty: 30 | Fill #0

## 2020-03-26 MED FILL — FREESTYLE LITE TEST STRIP: 50 days supply | Qty: 100 | Fill #1

## 2020-03-26 MED FILL — PRAVASTATIN NA 40 MG TAB: 40 | 90 days supply | Qty: 90 | Fill #3

## 2020-03-26 MED FILL — DEXCOM G6 SENSOR MISC: 30 days supply | Qty: 3 | Fill #3

## 2020-03-26 MED FILL — SSD 1% CREAM: 1 | 7 days supply | Qty: 25 | Fill #0

## 2020-03-26 MED FILL — traZODone HCL 50 MG TABS: 50 | 90 days supply | Qty: 180 | Fill #1

## 2020-03-26 MED FILL — ALBUTEROL SULFATE HFA 108 (: 108 (90 BAS | 16 days supply | Qty: 18 | Fill #1

## 2020-03-30 ENCOUNTER — Encounter: Payer: Self-pay | Admitting: Internal Medicine

## 2020-04-15 ENCOUNTER — Other Ambulatory Visit (HOSPITAL_COMMUNITY): Payer: Self-pay | Admitting: Psychiatry

## 2020-04-15 DIAGNOSIS — G894 Chronic pain syndrome: Secondary | ICD-10-CM | POA: Diagnosis not present

## 2020-04-15 DIAGNOSIS — F900 Attention-deficit hyperactivity disorder, predominantly inattentive type: Secondary | ICD-10-CM | POA: Diagnosis not present

## 2020-04-15 MED FILL — L-METHYLFOLATE-ALGAE 15-90.: 15-90.314 | 30 days supply | Qty: 30 | Fill #0

## 2020-04-16 ENCOUNTER — Other Ambulatory Visit: Payer: Self-pay

## 2020-04-16 ENCOUNTER — Ambulatory Visit
Admission: RE | Admit: 2020-04-16 | Discharge: 2020-04-16 | Disposition: A | Discharge status: Home / Self Care | Payer: 59 | Source: Ambulatory Visit | Attending: Endocrinology | Admitting: Endocrinology

## 2020-04-16 DIAGNOSIS — E782 Mixed hyperlipidemia: Secondary | ICD-10-CM | POA: Diagnosis not present

## 2020-04-16 DIAGNOSIS — I10 Essential (primary) hypertension: Secondary | ICD-10-CM | POA: Diagnosis not present

## 2020-04-16 DIAGNOSIS — D509 Iron deficiency anemia, unspecified: Secondary | ICD-10-CM | POA: Diagnosis not present

## 2020-04-16 DIAGNOSIS — Z1231 Encounter for screening mammogram for malignant neoplasm of breast: Secondary | ICD-10-CM

## 2020-04-16 DIAGNOSIS — E559 Vitamin D deficiency, unspecified: Secondary | ICD-10-CM | POA: Diagnosis not present

## 2020-04-16 DIAGNOSIS — E038 Other specified hypothyroidism: Secondary | ICD-10-CM | POA: Diagnosis not present

## 2020-04-16 DIAGNOSIS — R7302 Impaired glucose tolerance (oral): Secondary | ICD-10-CM | POA: Diagnosis not present

## 2020-04-21 MED FILL — LEXAPRO 20 MG TABLET: 20 | 30 days supply | Qty: 30 | Fill #0

## 2020-05-04 MED FILL — PREMARIN 0.45 MG TABLET: 0.45 | 90 days supply | Qty: 90 | Fill #3

## 2020-05-04 MED FILL — DEXCOM G6 SENSOR MISC: 30 days supply | Qty: 3 | Fill #4

## 2020-05-05 MED FILL — FREESTYLE LITE TEST STRIP: 50 days supply | Qty: 100 | Fill #2

## 2020-05-14 ENCOUNTER — Other Ambulatory Visit (HOSPITAL_COMMUNITY): Payer: Self-pay | Admitting: Endocrinology

## 2020-05-14 DIAGNOSIS — M159 Polyosteoarthritis, unspecified: Secondary | ICD-10-CM | POA: Diagnosis not present

## 2020-05-14 DIAGNOSIS — M797 Fibromyalgia: Secondary | ICD-10-CM | POA: Diagnosis not present

## 2020-05-14 DIAGNOSIS — K219 Gastro-esophageal reflux disease without esophagitis: Secondary | ICD-10-CM | POA: Diagnosis not present

## 2020-05-14 DIAGNOSIS — I10 Essential (primary) hypertension: Secondary | ICD-10-CM | POA: Diagnosis not present

## 2020-05-14 DIAGNOSIS — E039 Hypothyroidism, unspecified: Secondary | ICD-10-CM | POA: Diagnosis not present

## 2020-05-14 DIAGNOSIS — R7302 Impaired glucose tolerance (oral): Secondary | ICD-10-CM | POA: Diagnosis not present

## 2020-05-14 DIAGNOSIS — N951 Menopausal and female climacteric states: Secondary | ICD-10-CM | POA: Diagnosis not present

## 2020-05-14 DIAGNOSIS — E559 Vitamin D deficiency, unspecified: Secondary | ICD-10-CM | POA: Diagnosis not present

## 2020-05-14 DIAGNOSIS — E782 Mixed hyperlipidemia: Secondary | ICD-10-CM | POA: Diagnosis not present

## 2020-05-14 DIAGNOSIS — M79671 Pain in right foot: Secondary | ICD-10-CM | POA: Diagnosis not present

## 2020-05-14 DIAGNOSIS — M79672 Pain in left foot: Secondary | ICD-10-CM | POA: Diagnosis not present

## 2020-05-14 DIAGNOSIS — G894 Chronic pain syndrome: Secondary | ICD-10-CM | POA: Diagnosis not present

## 2020-05-14 MED FILL — SYNTHROID 25 MCG TABLET: 25 | 90 days supply | Qty: 135 | Fill #0

## 2020-05-14 MED FILL — ALBUTEROL SULFATE HFA 108 (: 108 (90 BAS | 17 days supply | Qty: 18 | Fill #0

## 2020-05-14 MED FILL — BENZONATATE 100 MG CAPS: 100 | 10 days supply | Qty: 30 | Fill #0

## 2020-05-21 MED FILL — DAYTRANA 15 MG/9 HR PATCH: 15 | 30 days supply | Qty: 30 | Fill #0

## 2020-05-21 MED FILL — LEXAPRO 20 MG TABLET: 20 | 30 days supply | Qty: 30 | Fill #1

## 2020-05-21 MED FILL — DAYTRANA 10 MG/9 HR PATCH: 10 | 30 days supply | Qty: 30 | Fill #0

## 2020-05-21 MED FILL — L-METHYLFOLATE-ALGAE 15-90.: 15-90.314 | 30 days supply | Qty: 30 | Fill #1

## 2020-05-21 MED FILL — XIIDRA 5 % SOLN: 5 | 90 days supply | Qty: 180 | Fill #1

## 2020-06-05 MED FILL — DEXCOM G6 SENSOR MISC: 30 days supply | Qty: 3 | Fill #5

## 2020-06-18 MED FILL — DAYTRANA 15 MG/9 HR PATCH: 15 | 30 days supply | Qty: 30 | Fill #0

## 2020-06-18 MED FILL — LEXAPRO 20 MG TABLET: 20 | 30 days supply | Qty: 30 | Fill #2

## 2020-06-18 MED FILL — DEXCOM G6 TRANSMITTER MISC: 90 days supply | Qty: 1 | Fill #0

## 2020-07-01 MED FILL — L-METHYLFOLATE FORTE 15 MG: 15-90.314 | 30 days supply | Qty: 30 | Fill #2

## 2020-07-02 DIAGNOSIS — L820 Inflamed seborrheic keratosis: Secondary | ICD-10-CM | POA: Diagnosis not present

## 2020-07-02 DIAGNOSIS — L304 Erythema intertrigo: Secondary | ICD-10-CM | POA: Diagnosis not present

## 2020-07-02 DIAGNOSIS — D2271 Melanocytic nevi of right lower limb, including hip: Secondary | ICD-10-CM | POA: Diagnosis not present

## 2020-07-02 DIAGNOSIS — D225 Melanocytic nevi of trunk: Secondary | ICD-10-CM | POA: Diagnosis not present

## 2020-07-02 DIAGNOSIS — D2272 Melanocytic nevi of left lower limb, including hip: Secondary | ICD-10-CM | POA: Diagnosis not present

## 2020-07-02 DIAGNOSIS — L821 Other seborrheic keratosis: Secondary | ICD-10-CM | POA: Diagnosis not present

## 2020-07-02 DIAGNOSIS — L814 Other melanin hyperpigmentation: Secondary | ICD-10-CM | POA: Diagnosis not present

## 2020-07-07 ENCOUNTER — Other Ambulatory Visit (HOSPITAL_COMMUNITY): Payer: Self-pay | Admitting: Obstetrics and Gynecology

## 2020-07-07 DIAGNOSIS — R6882 Decreased libido: Secondary | ICD-10-CM | POA: Diagnosis not present

## 2020-07-07 DIAGNOSIS — N907 Vulvar cyst: Secondary | ICD-10-CM | POA: Diagnosis not present

## 2020-07-07 DIAGNOSIS — N951 Menopausal and female climacteric states: Secondary | ICD-10-CM | POA: Diagnosis not present

## 2020-07-08 ENCOUNTER — Other Ambulatory Visit (HOSPITAL_COMMUNITY): Payer: Self-pay | Admitting: Psychiatry

## 2020-07-08 DIAGNOSIS — G894 Chronic pain syndrome: Secondary | ICD-10-CM | POA: Diagnosis not present

## 2020-07-08 DIAGNOSIS — F900 Attention-deficit hyperactivity disorder, predominantly inattentive type: Secondary | ICD-10-CM | POA: Diagnosis not present

## 2020-07-08 DIAGNOSIS — F411 Generalized anxiety disorder: Secondary | ICD-10-CM | POA: Diagnosis not present

## 2020-07-08 MED FILL — traZODone HCL 50 MG TABS: 50 | 90 days supply | Qty: 180 | Fill #0

## 2020-07-08 MED FILL — GABAPENTIN 100 MG CAPSULE: 100 | 30 days supply | Qty: 180 | Fill #0

## 2020-07-09 MED FILL — DEXCOM G6 SENSOR MISC: 30 days supply | Qty: 3 | Fill #0

## 2020-07-10 ENCOUNTER — Other Ambulatory Visit (HOSPITAL_COMMUNITY): Payer: Self-pay | Admitting: Neurological Surgery

## 2020-07-10 DIAGNOSIS — M4120 Other idiopathic scoliosis, site unspecified: Secondary | ICD-10-CM | POA: Diagnosis not present

## 2020-07-10 DIAGNOSIS — M4714 Other spondylosis with myelopathy, thoracic region: Secondary | ICD-10-CM | POA: Diagnosis not present

## 2020-07-10 DIAGNOSIS — Z6837 Body mass index (BMI) 37.0-37.9, adult: Secondary | ICD-10-CM | POA: Diagnosis not present

## 2020-07-10 MED FILL — ALPRAZolam 1 MG TABS: 1 | 1 days supply | Qty: 1 | Fill #0

## 2020-07-15 ENCOUNTER — Other Ambulatory Visit: Payer: Self-pay | Admitting: Neurological Surgery

## 2020-07-15 DIAGNOSIS — M4714 Other spondylosis with myelopathy, thoracic region: Secondary | ICD-10-CM

## 2020-07-20 ENCOUNTER — Other Ambulatory Visit (HOSPITAL_COMMUNITY): Payer: Self-pay

## 2020-07-20 MED FILL — Estrogens, Conjugated Tab 0.45 MG: ORAL | 90 days supply | Qty: 90 | Fill #0 | Status: AC

## 2020-07-20 MED FILL — Continuous Glucose System Sensor: 30 days supply | Qty: 3 | Fill #0 | Status: AC

## 2020-07-20 MED FILL — Pravastatin Sodium Tab 40 MG: ORAL | 90 days supply | Qty: 90 | Fill #0 | Status: AC

## 2020-07-20 MED FILL — Methylphenidate TD Patch 10 MG/9HR: TRANSDERMAL | 30 days supply | Qty: 30 | Fill #0 | Status: AC

## 2020-07-20 MED FILL — Escitalopram Oxalate Tab 20 MG (Base Equiv): ORAL | 30 days supply | Qty: 30 | Fill #0 | Status: AC

## 2020-07-21 ENCOUNTER — Other Ambulatory Visit (HOSPITAL_COMMUNITY): Payer: Self-pay

## 2020-07-22 ENCOUNTER — Other Ambulatory Visit (HOSPITAL_COMMUNITY): Payer: Self-pay

## 2020-07-27 ENCOUNTER — Other Ambulatory Visit (HOSPITAL_COMMUNITY): Payer: Self-pay

## 2020-08-04 ENCOUNTER — Other Ambulatory Visit (HOSPITAL_COMMUNITY): Payer: Self-pay

## 2020-08-04 MED FILL — Methylphenidate TD Patch 15 MG/9HR: TRANSDERMAL | 30 days supply | Qty: 30 | Fill #0 | Status: AC

## 2020-08-04 MED FILL — L-Methylfolate-Algae Cap 15-90.314 MG: ORAL | 30 days supply | Qty: 30 | Fill #0 | Status: AC

## 2020-08-05 DIAGNOSIS — F900 Attention-deficit hyperactivity disorder, predominantly inattentive type: Secondary | ICD-10-CM | POA: Diagnosis not present

## 2020-08-05 DIAGNOSIS — G894 Chronic pain syndrome: Secondary | ICD-10-CM | POA: Diagnosis not present

## 2020-08-06 ENCOUNTER — Telehealth: Payer: Self-pay | Admitting: Neurology

## 2020-08-06 NOTE — Telephone Encounter (Signed)
We received a new referral on patient from Dr. Ellene Route for neuropathic symptoms/thoracic spondylosis with myelopathy. She has seen Dr. Brett Fairy in the past, but is now requesting to see Dr. Jaynee Eagles. Would you both be ok with this?

## 2020-08-06 NOTE — Telephone Encounter (Signed)
I would be OK- and I would like for her to see a neuromuscular neurologist . CD

## 2020-08-13 ENCOUNTER — Other Ambulatory Visit (HOSPITAL_COMMUNITY): Payer: Self-pay

## 2020-08-13 DIAGNOSIS — E559 Vitamin D deficiency, unspecified: Secondary | ICD-10-CM | POA: Diagnosis not present

## 2020-08-13 DIAGNOSIS — R7301 Impaired fasting glucose: Secondary | ICD-10-CM | POA: Diagnosis not present

## 2020-08-13 DIAGNOSIS — E039 Hypothyroidism, unspecified: Secondary | ICD-10-CM | POA: Diagnosis not present

## 2020-08-13 DIAGNOSIS — I10 Essential (primary) hypertension: Secondary | ICD-10-CM | POA: Diagnosis not present

## 2020-08-13 DIAGNOSIS — G43909 Migraine, unspecified, not intractable, without status migrainosus: Secondary | ICD-10-CM | POA: Diagnosis not present

## 2020-08-13 DIAGNOSIS — E041 Nontoxic single thyroid nodule: Secondary | ICD-10-CM | POA: Diagnosis not present

## 2020-08-13 DIAGNOSIS — Q211 Atrial septal defect: Secondary | ICD-10-CM | POA: Diagnosis not present

## 2020-08-13 DIAGNOSIS — E669 Obesity, unspecified: Secondary | ICD-10-CM | POA: Diagnosis not present

## 2020-08-13 DIAGNOSIS — K219 Gastro-esophageal reflux disease without esophagitis: Secondary | ICD-10-CM | POA: Diagnosis not present

## 2020-08-13 DIAGNOSIS — E782 Mixed hyperlipidemia: Secondary | ICD-10-CM | POA: Diagnosis not present

## 2020-08-13 DIAGNOSIS — R110 Nausea: Secondary | ICD-10-CM | POA: Diagnosis not present

## 2020-08-13 DIAGNOSIS — M797 Fibromyalgia: Secondary | ICD-10-CM | POA: Diagnosis not present

## 2020-08-13 MED ORDER — SILVER SULFADIAZINE 1 % EX CREA
1.0000 "application " | TOPICAL_CREAM | CUTANEOUS | 2 refills | Status: AC
  Filled 2020-08-13: qty 25, 14d supply, fill #0

## 2020-08-13 MED ORDER — ICOSAPENT ETHYL 1 G PO CAPS
2.0000 g | ORAL_CAPSULE | Freq: Two times a day (BID) | ORAL | 4 refills | Status: DC
  Filled 2020-08-13: qty 360, 90d supply, fill #0

## 2020-08-13 MED ORDER — METFORMIN HCL ER 500 MG PO TB24
500.0000 mg | ORAL_TABLET | Freq: Every day | ORAL | 4 refills | Status: DC
  Filled 2020-08-13: qty 90, 90d supply, fill #0

## 2020-08-13 MED ORDER — FREESTYLE LANCETS MISC
4 refills | Status: DC
  Filled 2020-08-13: qty 200, 90d supply, fill #0

## 2020-08-13 MED ORDER — PRAVASTATIN SODIUM 40 MG PO TABS
40.0000 mg | ORAL_TABLET | Freq: Every day | ORAL | 4 refills | Status: DC
  Filled 2020-08-13 – 2020-11-04 (×3): qty 90, 90d supply, fill #0
  Filled 2021-02-10: qty 90, 90d supply, fill #1
  Filled 2021-05-20: qty 90, 90d supply, fill #2

## 2020-08-13 MED ORDER — SYNTHROID 25 MCG PO TABS
37.5000 ug | ORAL_TABLET | Freq: Every day | ORAL | 4 refills | Status: DC
  Filled 2020-08-13: qty 135, 90d supply, fill #0
  Filled 2020-12-03: qty 135, 90d supply, fill #1

## 2020-08-13 MED ORDER — FREESTYLE LIBRE 2 SENSOR MISC
12 refills | Status: DC
  Filled 2020-08-13: qty 2, 28d supply, fill #0

## 2020-08-13 MED ORDER — BENZONATATE 100 MG PO CAPS
100.0000 mg | ORAL_CAPSULE | Freq: Three times a day (TID) | ORAL | 3 refills | Status: DC | PRN
  Filled 2020-08-13: qty 30, 10d supply, fill #0

## 2020-08-13 MED ORDER — GLUCOSE BLOOD VI STRP
ORAL_STRIP | 8 refills | Status: DC
  Filled 2020-08-13: qty 100, 50d supply, fill #0

## 2020-08-13 MED ORDER — EPINEPHRINE 0.3 MG/0.3ML IJ SOAJ
INTRAMUSCULAR | 6 refills | Status: DC
  Filled 2020-08-13: qty 2, 30d supply, fill #0

## 2020-08-13 MED ORDER — ALBUTEROL SULFATE HFA 108 (90 BASE) MCG/ACT IN AERS
2.0000 | INHALATION_SPRAY | RESPIRATORY_TRACT | 5 refills | Status: DC | PRN
  Filled 2020-08-13: qty 18, 17d supply, fill #0

## 2020-08-13 MED ORDER — AMOXICILLIN 500 MG PO CAPS
2000.0000 mg | ORAL_CAPSULE | ORAL | 3 refills | Status: DC
  Filled 2020-08-13: qty 4, 1d supply, fill #0
  Filled 2021-01-05 – 2021-02-10 (×2): qty 4, 1d supply, fill #1

## 2020-08-15 ENCOUNTER — Other Ambulatory Visit: Payer: Self-pay

## 2020-08-15 ENCOUNTER — Ambulatory Visit
Admission: RE | Admit: 2020-08-15 | Discharge: 2020-08-15 | Disposition: A | Discharge status: Home / Self Care | Payer: 59 | Source: Ambulatory Visit | Attending: Neurological Surgery | Admitting: Neurological Surgery

## 2020-08-15 DIAGNOSIS — M5124 Other intervertebral disc displacement, thoracic region: Secondary | ICD-10-CM | POA: Diagnosis not present

## 2020-08-15 DIAGNOSIS — M4714 Other spondylosis with myelopathy, thoracic region: Secondary | ICD-10-CM

## 2020-08-15 DIAGNOSIS — M47814 Spondylosis without myelopathy or radiculopathy, thoracic region: Secondary | ICD-10-CM | POA: Diagnosis not present

## 2020-08-17 DIAGNOSIS — M4714 Other spondylosis with myelopathy, thoracic region: Secondary | ICD-10-CM | POA: Diagnosis not present

## 2020-08-18 DIAGNOSIS — H524 Presbyopia: Secondary | ICD-10-CM | POA: Diagnosis not present

## 2020-08-21 ENCOUNTER — Other Ambulatory Visit (HOSPITAL_COMMUNITY): Payer: Self-pay

## 2020-08-24 ENCOUNTER — Other Ambulatory Visit (HOSPITAL_COMMUNITY): Payer: Self-pay

## 2020-08-25 ENCOUNTER — Other Ambulatory Visit (HOSPITAL_COMMUNITY): Payer: Self-pay

## 2020-08-25 DIAGNOSIS — M1712 Unilateral primary osteoarthritis, left knee: Secondary | ICD-10-CM | POA: Diagnosis not present

## 2020-08-25 DIAGNOSIS — S46012A Strain of muscle(s) and tendon(s) of the rotator cuff of left shoulder, initial encounter: Secondary | ICD-10-CM | POA: Diagnosis not present

## 2020-08-25 DIAGNOSIS — M19012 Primary osteoarthritis, left shoulder: Secondary | ICD-10-CM | POA: Diagnosis not present

## 2020-08-25 DIAGNOSIS — M25561 Pain in right knee: Secondary | ICD-10-CM | POA: Diagnosis not present

## 2020-08-25 MED FILL — Escitalopram Oxalate Tab 20 MG (Base Equiv): ORAL | 30 days supply | Qty: 30 | Fill #1 | Status: AC

## 2020-09-02 ENCOUNTER — Other Ambulatory Visit (HOSPITAL_COMMUNITY): Payer: Self-pay

## 2020-09-02 MED FILL — Methylphenidate TD Patch 10 MG/9HR: TRANSDERMAL | 30 days supply | Qty: 30 | Fill #0 | Status: AC

## 2020-09-02 MED FILL — Methylphenidate TD Patch 15 MG/9HR: TRANSDERMAL | 30 days supply | Qty: 30 | Fill #0 | Status: AC

## 2020-09-02 MED FILL — L-Methylfolate-Algae Cap 15-90.314 MG: ORAL | 30 days supply | Qty: 30 | Fill #1 | Status: AC

## 2020-09-03 ENCOUNTER — Other Ambulatory Visit (HOSPITAL_COMMUNITY): Payer: Self-pay

## 2020-09-03 MED FILL — Continuous Glucose System Sensor: 30 days supply | Qty: 3 | Fill #1 | Status: AC

## 2020-09-04 ENCOUNTER — Other Ambulatory Visit (HOSPITAL_COMMUNITY): Payer: Self-pay

## 2020-09-16 ENCOUNTER — Other Ambulatory Visit (HOSPITAL_BASED_OUTPATIENT_CLINIC_OR_DEPARTMENT_OTHER): Payer: Self-pay

## 2020-09-23 ENCOUNTER — Other Ambulatory Visit (HOSPITAL_COMMUNITY): Payer: Self-pay

## 2020-09-23 MED FILL — Escitalopram Oxalate Tab 20 MG (Base Equiv): ORAL | 30 days supply | Qty: 30 | Fill #2 | Status: AC

## 2020-09-23 MED FILL — Continuous Glucose System Transmitter: 90 days supply | Qty: 1 | Fill #0 | Status: CN

## 2020-09-24 ENCOUNTER — Other Ambulatory Visit (HOSPITAL_COMMUNITY): Payer: Self-pay

## 2020-09-24 MED ORDER — FREESTYLE LIBRE 2 SENSOR MISC
4 refills | Status: DC
  Filled 2020-09-24: qty 6, 84d supply, fill #0
  Filled 2020-12-03: qty 6, 84d supply, fill #1
  Filled 2021-02-17: qty 6, 84d supply, fill #2
  Filled 2021-04-30 – 2021-05-10 (×2): qty 6, 84d supply, fill #3
  Filled 2021-06-24: qty 2, 28d supply, fill #3
  Filled 2021-07-26: qty 3, 42d supply, fill #4
  Filled 2021-08-23: qty 2, 28d supply, fill #5

## 2020-09-25 ENCOUNTER — Other Ambulatory Visit (HOSPITAL_COMMUNITY): Payer: Self-pay

## 2020-09-25 MED ORDER — CARESTART COVID-19 HOME TEST VI KIT
PACK | 0 refills | Status: DC
  Filled 2020-09-25: qty 4, 4d supply, fill #0

## 2020-10-06 ENCOUNTER — Other Ambulatory Visit (HOSPITAL_COMMUNITY): Payer: Self-pay

## 2020-10-06 MED FILL — Methylphenidate TD Patch 15 MG/9HR: TRANSDERMAL | 30 days supply | Qty: 30 | Fill #0 | Status: AC

## 2020-10-06 MED FILL — Methylphenidate TD Patch 10 MG/9HR: TRANSDERMAL | 30 days supply | Qty: 30 | Fill #0 | Status: AC

## 2020-10-06 MED FILL — Escitalopram Oxalate Tab 20 MG (Base Equiv): ORAL | 30 days supply | Qty: 30 | Fill #0 | Status: AC

## 2020-10-06 MED FILL — L-Methylfolate-Algae Cap 15-90.314 MG: ORAL | 30 days supply | Qty: 30 | Fill #2 | Status: AC

## 2020-10-07 ENCOUNTER — Other Ambulatory Visit (HOSPITAL_COMMUNITY): Payer: Self-pay

## 2020-10-21 ENCOUNTER — Emergency Department (HOSPITAL_BASED_OUTPATIENT_CLINIC_OR_DEPARTMENT_OTHER): Payer: 59

## 2020-10-21 ENCOUNTER — Emergency Department (HOSPITAL_BASED_OUTPATIENT_CLINIC_OR_DEPARTMENT_OTHER)
Admission: EM | Admit: 2020-10-21 | Discharge: 2020-10-21 | Disposition: A | Discharge status: Home / Self Care | Payer: 59 | Attending: Emergency Medicine | Admitting: Emergency Medicine

## 2020-10-21 ENCOUNTER — Other Ambulatory Visit: Payer: Self-pay

## 2020-10-21 ENCOUNTER — Encounter (HOSPITAL_BASED_OUTPATIENT_CLINIC_OR_DEPARTMENT_OTHER): Payer: Self-pay | Admitting: Obstetrics and Gynecology

## 2020-10-21 DIAGNOSIS — E119 Type 2 diabetes mellitus without complications: Secondary | ICD-10-CM | POA: Diagnosis not present

## 2020-10-21 DIAGNOSIS — U071 COVID-19: Secondary | ICD-10-CM | POA: Diagnosis not present

## 2020-10-21 DIAGNOSIS — R112 Nausea with vomiting, unspecified: Secondary | ICD-10-CM | POA: Insufficient documentation

## 2020-10-21 DIAGNOSIS — Z7984 Long term (current) use of oral hypoglycemic drugs: Secondary | ICD-10-CM | POA: Diagnosis not present

## 2020-10-21 DIAGNOSIS — E039 Hypothyroidism, unspecified: Secondary | ICD-10-CM | POA: Insufficient documentation

## 2020-10-21 DIAGNOSIS — I10 Essential (primary) hypertension: Secondary | ICD-10-CM | POA: Insufficient documentation

## 2020-10-21 DIAGNOSIS — R059 Cough, unspecified: Secondary | ICD-10-CM | POA: Diagnosis not present

## 2020-10-21 DIAGNOSIS — R519 Headache, unspecified: Secondary | ICD-10-CM | POA: Diagnosis not present

## 2020-10-21 DIAGNOSIS — J029 Acute pharyngitis, unspecified: Secondary | ICD-10-CM | POA: Diagnosis not present

## 2020-10-21 DIAGNOSIS — Z79899 Other long term (current) drug therapy: Secondary | ICD-10-CM | POA: Diagnosis not present

## 2020-10-21 DIAGNOSIS — Z96651 Presence of right artificial knee joint: Secondary | ICD-10-CM | POA: Diagnosis not present

## 2020-10-21 DIAGNOSIS — R110 Nausea: Secondary | ICD-10-CM | POA: Diagnosis not present

## 2020-10-21 MED ORDER — ONDANSETRON 4 MG PO TBDP: ORAL_TABLET | ORAL | 0 refills | Status: DC

## 2020-10-21 MED ORDER — BENZONATATE 100 MG PO CAPS: 100.0000 mg | ORAL_CAPSULE | Freq: Three times a day (TID) | ORAL | 0 refills | Status: DC

## 2020-10-21 MED ORDER — ONDANSETRON 4 MG PO TBDP
ORAL_TABLET | ORAL | 0 refills | Status: DC
  Filled 2020-10-21: qty 20, fill #0

## 2020-10-21 MED ORDER — BENZONATATE 100 MG PO CAPS
200.0000 mg | ORAL_CAPSULE | Freq: Once | ORAL | Status: AC
  Administered 2020-10-21: 200 mg via ORAL
  Filled 2020-10-21: qty 2

## 2020-10-21 MED ORDER — ONDANSETRON 4 MG PO TBDP
4.0000 mg | ORAL_TABLET | Freq: Once | ORAL | Status: AC
  Administered 2020-10-21: 4 mg via ORAL
  Filled 2020-10-21: qty 1

## 2020-10-21 MED ORDER — BENZONATATE 100 MG PO CAPS
100.0000 mg | ORAL_CAPSULE | Freq: Three times a day (TID) | ORAL | 0 refills | Status: DC
  Filled 2020-10-21: qty 21, 7d supply, fill #0

## 2020-10-21 NOTE — Discharge Instructions (Addendum)
Take tylenol 2 pills 4 times a day and motrin 4 pills 3 times a day.  Drink plenty of fluids.  Return for worsening shortness of breath, headache, confusion. Follow up with your family doctor.   

## 2020-10-21 NOTE — ED Provider Notes (Signed)
[facility] EMERGENCY DEPT Provider Note   CSN: [id number] Arrival date & time: 10/21/20  2038     History Chief Complaint  Patient presents with   Covid Positive    Rita Gregory is a 75 y.o. female.  75 yo F with a chief complaint of having the coronavirus.  Going on for about 4 days now.  Patient was exposed during an [employer] reunion.  Her husband has had some mild symptoms.  He is a pediatrician and was worried about her work of breathing earlier today.  Feels like she is a bit better now.  She has had some nausea and vomiting with this.  Has had some shortness of breath off and on.  Fevers and chills.  The history is provided by the patient and the spouse.  Illness Severity:  Moderate Onset quality:  Gradual Duration:  4 days Timing:  Constant Progression:  Worsening Chronicity:  New Associated symptoms: cough, fever, headaches, myalgias and shortness of breath   Associated symptoms: no chest pain, no congestion, no nausea, no rhinorrhea, no vomiting and no wheezing       Past Medical History:  Diagnosis Date   Allergy    Arthritis of left acromioclavicular joint    Asthma    Blood transfusion without reported diagnosis    unsure when   Cervical disc disorder with left arm radiculopathy of cervical region 04/20/2017   frequent headaches   Chronic pain    Dr. Ellene Route follows   Colon polyp 11/09/2007   Hyperplastic    Depression    Diverticulosis of colon (without mention of hemorrhage) 11/09/2007   Dry eye    Esophageal reflux    Fibromyalgia    Hyperlipidemia    Hypertension    in the past   Hypothyroidism    Kyphoscoliosis    40 degree bend ro right side   Paroxysmal spells 02/13/2013   Patent foramen ovale    hx.- Dr. Lemmie Evens. Smith,cardiology- follows   Pre-diabetes    Controls with diet   Rotator cuff tear, non-traumatic, left    Shoulder impingement, left     Patient Active Problem List   Diagnosis Date Noted   Weakness of  extremity 08/24/2018   Dizzy spells 08/24/2018   Tunnel vision, bilateral 08/24/2018   Episodic recurrent vertigo 08/24/2018   Diabetes ([place]) 04/20/2017   Cervical disc disorder with left arm radiculopathy of cervical region 04/20/2017   Rotator cuff tear, non-traumatic, left    Shoulder impingement, left    Arthritis of left acromioclavicular joint    Foot pain, bilateral 10/04/2016   Chronic fatigue 05/06/2016   Menopausal symptoms 05/06/2016   Mixed dyslipidemia 05/06/2016   Osteoarthritis, generalized 05/06/2016   Primary hypothyroidism 05/06/2016   Vitamin D deficiency 05/06/2016   Hypertension, essential 05/06/2016   Impaired glucose tolerance 05/06/2016   Hypoalbuminemia due to protein-calorie malnutrition (HCC)    Degenerative lumbar spinal stenosis 06/22/2015   Weakness of both lower extremities    Ataxia    Gastroesophageal reflux disease without esophagitis    Constipation due to pain medication    S/P lumbar fusion    Lethargy    Postprocedural hypotension    Benign essential HTN    Fibromyalgia    Chronic pain syndrome    Post-operative pain    Respiratory depression    Acute blood loss anemia    Leukocytosis    Hypomagnesemia    Lumbar spondylolysis    Intractable back pain 06/12/2015   Scoliosis  of lumbar spine 06/12/2015   Paroxysmal spells 02/13/2013   GERD (gastroesophageal reflux disease) 02/16/2012   Diverticulosis 02/16/2012   Personal history of colonic polyps 02/16/2012   ARTHRITIS, RIGHT KNEE 03/27/2008   BUNIONS, BILATERAL 03/27/2008   FIBROMYALGIA 03/27/2008   FOOT PAIN, BILATERAL 03/27/2008   UNEQUAL LEG LENGTH 03/27/2008   CONGENITAL PES PLANUS 03/27/2008    Past Surgical History:  Procedure Laterality Date   ABDOMINAL HYSTERECTOMY     ANTERIOR LATERAL LUMBAR FUSION 4 LEVELS Left 06/16/2015   Procedure: ANTERIOR LATERAL LUMBAR FUSION THORACIC TWELVE-LUMBAR FOUR;  Surgeon: Kristeen Miss, MD;  Location: [facility];  Service: Neurosurgery;   Laterality: Left;  Thoracolumbar spine   APPENDECTOMY     CESAREAN SECTION     x2   CHOLECYSTECTOMY     COLONOSCOPY  03/23/2020   dry eyes     uses Restasis daily   ESOPHAGOGASTRODUODENOSCOPY  02/23/2012   Procedure: ESOPHAGOGASTRODUODENOSCOPY (EGD);  Surgeon: Irene Shipper, MD;  Location: [facility];  Service: Endoscopy;  Laterality: N/A;   JOINT REPLACEMENT     RTKA '05   KNEE ARTHROSCOPY W/ ACL RECONSTRUCTION     Rt. Knee   POSTERIOR LUMBAR FUSION 4 LEVEL N/A 06/16/2015   Procedure: POSTERIOR LUMBAR FUSION LUMBAR FOUR-FIVE LUMBAR FIVE-SACRAL ONE;  Surgeon: Kristeen Miss, MD;  Location: [facility];  Service: Neurosurgery;  Laterality: N/A;   RADIOLOGY WITH ANESTHESIA N/A 06/15/2015   Procedure: RADIOLOGY WITH ANESTHESIA;  Surgeon: Medication Radiologist, MD;  Location: [facility];  Service: Radiology;  Laterality: N/A;   right rotator cuff  2011   sx   SHOULDER ARTHROSCOPY Left 04/28/2017   Procedure: ARTHROSCOPY SHOULDER WITH EXTENSIVE DEBRIDEMENT ROTATOR CUFF TEAR;  Surgeon: Elsie Saas, MD;  Location: [facility];  Service: Orthopedics;  Laterality: Left;   UPPER GASTROINTESTINAL ENDOSCOPY  03/23/2020     OB History     Gravida      Para      Term      Preterm      AB      Living  2      SAB      IAB      Ectopic      Multiple      Live Births              Family History  Problem Relation Age of Onset   Ovarian cancer Mother        passed at age 16   Heart disease Father    Kidney disease Father        Kidney cancer   Diabetes type II Father        passed at 74 yrs old   Colon polyps Father    Bipolar disorder Sister        Osteopenia   Bipolar disorder Son    Esophageal cancer Neg Hx    Rectal cancer Neg Hx    Stomach cancer Neg Hx    Colon cancer Neg Hx     Social History   Tobacco Use   Smoking status: Never    Passive exposure: Yes   Smokeless tobacco: Never   Tobacco comments:    Flight attentent for 7 years.  [id number] Passive smoker  Vaping Use   Vaping Use: Never used  Substance Use Topics   Alcohol use: Yes    Comment: Very rarely    Drug use: No    Home Medications Prior to Admission medications  Medication Sig Start Date End Date Taking? Authorizing Provider  acetaminophen (TYLENOL) 650 MG CR tablet Take 650 mg by mouth every 8 (eight) hours as needed for pain.    [provider]  albuterol (PROVENTIL HFA;VENTOLIN HFA) 108 (90 BASE) MCG/ACT inhaler Inhale 2 puffs into the lungs every 6 (six) hours as needed. For shortness of breath    [provider]  albuterol (VENTOLIN HFA) 108 (90 Base) MCG/ACT inhaler INHALE 2 SPRAYS EVERY 4 HOURS AS NEEDED FOR WHEEZING 05/14/20 05/14/21  Altheimer, Legrand Como, MD  albuterol (VENTOLIN HFA) 108 (90 Base) MCG/ACT inhaler Inhale 2 puffs into the lungs every 4 (four) hours as needed for wheezing 08/13/20     ALPRAZolam (XANAX) 1 MG tablet TAKE 1 TABLET BY MOUTH PRIOR TO MRI 07/10/20 01/06/21  Kristeen Miss, MD  amoxicillin (AMOXIL) 500 MG capsule as needed. 09/26/19   [provider]  amoxicillin (AMOXIL) 500 MG capsule Take 4 capsules (2,000 mg total) by mouth as directed 1 hours before dental treatment 08/13/20     benzonatate (TESSALON) 100 MG capsule as needed. 06/10/19   [provider]  benzonatate (TESSALON) 100 MG capsule Take 1 capsule (100 mg total) by mouth every 8 (eight) hours. 10/21/20   Deno Etienne, DO  carboxymethylcellulose (REFRESH PLUS) 0.5 % SOLN 1 drop daily as needed. Patient not taking: Reported on 03/23/2020    [provider]  conjugated estrogens (PREMARIN) vaginal cream Place 0.625 mg vaginally daily as needed.     [provider]  Continuous Blood Gluc Receiver ([place]) [place] USE TO CONTINUOUSLY MONITOR GLUCOSE 05/14/20 05/14/21  Altheimer, Legrand Como, MD  Continuous Blood Gluc Sensor (DEXCOM G6 SENSOR) MISC Use to continuously monitor glucose.  Change every 10 days 07/01/19   [provider]  Continuous Blood Gluc Sensor (DEXCOM G6 SENSOR) MISC USE TO CONTINUOUSLY MONITOR GLUCOSE. CHANGE EVERY 10 DAYS 05/14/20 05/14/21  Altheimer, Legrand Como, MD  Continuous Blood Gluc Sensor (FREESTYLE LIBRE 2 SENSOR) MISC Inject into skin every 14 days as directed for glucose monitoring 08/13/20     Continuous Blood Gluc Sensor (FREESTYLE LIBRE 2 SENSOR) MISC Inject into the skin every 14 days. Use as directed for 14 days per sensor for glucose monitoring 09/24/20     Continuous Blood Gluc Transmit (DEXCOM G6 TRANSMITTER) MISC USE WITH DEXCOM SENSORS TO CONTINUOUSLY MONITOR GLUCOSE. CHANGE EVERY THREE MONTHS 05/14/20 05/14/21  Altheimer, Legrand Como, MD  COVID-19 At Home Antigen Test [facility] COVID-19 HOME TEST) KIT use as directed 09/25/20   Jefm Bryant, [facility]  Cyanocobalamin (VITAMIN B 12 PO) Take 3,000 mcg by mouth daily.    [provider]  EPINEPHrine (EPI-PEN) 0.3 mg/0.3 mL DEVI Inject 0.3 mg into the muscle once. To medications, touch, or inhaltions    [provider]  EPINEPHrine 0.3 mg/0.3 mL IJ SOAJ injection USE AS DIRECTED AS NEEDED FOR SEVERE ALLERGIC REACTION 03/26/20 03/26/21  Altheimer, Legrand Como, MD  EPINEPHrine 0.3 mg/0.3 mL IJ SOAJ injection Use as directed for severe allergic reaction 08/13/20     escitalopram (LEXAPRO) 10 MG tablet Take 10 mg by mouth 2 (two) times daily. Brand name only    [provider]  esomeprazole (NEXIUM) 20 MG capsule Take 20-40 mg by mouth at bedtime as needed.    [provider]  estrogens, conjugated, (PREMARIN) 0.45 MG tablet TAKE 1 TABLET BY MOUTH ONCE DAILY. 07/07/20 07/07/21  Christophe Louis, MD  estrogens, conjugated, (PREMARIN) 0.45 MG tablet TAKE 1 TABLET BY MOUTH ONCE A DAY  06/14/19 06/13/20  Christophe Louis, MD  gabapentin (NEURONTIN) 100 MG capsule TAKE 1-3 CAPSULES BY MOUTH 2 TIMES A DAY 07/08/20 07/08/21  Boison, Amy S, NP  glucose blood test strip USE 1 TO CHECK BLOOD GLUCOSE TWICE DAILY. 05/14/20 05/14/21  Altheimer, Legrand Como, MD   glucose blood test strip Use to test blood sugar 2 times daily 08/13/20     hydrocortisone 2.5 % cream as needed. 12/25/18   [provider]  icosapent Ethyl (VASCEPA) 1 g capsule Take 2 capsules (2 g total) by mouth 2 (two) times daily with a meal. 08/13/20     ketoconazole (NIZORAL) 2 % cream Apply 1 application topically 2 (two) times daily as needed for irritation.     [provider]  L-methylfolate Calcium 15 MG TABS daily. 09/26/19   [provider]  L-Methylfolate-Algae (DEPLIN 15) 15-90.314 MG CAPS Take 1 capsule by mouth daily. 05/19/15   [provider]  L-Methylfolate-Algae (DEPLIN 15) 15-90.314 MG CAPS TAKE 1 CAPSULE BY MOUTH DAILY 07/08/20 07/08/21  Boison, Amy S, NP  Lancets (FREESTYLE) lancets USE TO TEST BLOOD SUGAR 2 TIMES A DAY (DX: E11.9) 05/14/20 05/14/21  Altheimer, Legrand Como, MD  Lancets (FREESTYLE) lancets Use to test blood sugar twice a day 08/13/20   Altheimer, Legrand Como, MD  Lancets 30G MISC USE TO TEST BLOOD SUGAR TWICE A DAY (E11.0) 05/14/20 05/14/21  Altheimer, Legrand Como, MD  lansoprazole (PREVACID) 30 MG capsule Take 30 mg by mouth 2 (two) times daily before a meal.    [provider]  levothyroxine (SYNTHROID) 25 MCG tablet 1.5 mg daily 06/10/19   [provider]  LEXAPRO 20 MG tablet TAKE 1 TABLET BY MOUTH ONCE DAILY. 07/08/20 07/08/21  Boison, Amy S, NP  LEXAPRO 20 MG tablet TAKE 1 TABLET BY MOUTH ONCE DAILY. 04/15/20 04/15/21  Boison, Amy S, NP  Lifitegrast 5 % SOLN in the morning and at bedtime.    [provider]  Lifitegrast 5 % SOLN PLACE 1 DROP IN BOTH EYES TWO TIMES DAILY 01/24/20 01/23/21  Jola Schmidt, MD  loratadine (CLARITIN) 10 MG tablet Take 10 mg by mouth daily as needed for allergies.    [provider]  meclizine (ANTIVERT) 25 MG tablet Take 25 mg by mouth 3 (three) times daily as needed for dizziness.    [provider]  metFORMIN (GLUCOPHAGE-XR) 500 MG 24 hr tablet Take 1 tablet (500 mg total) by  mouth daily for prediabetes 08/13/20     methylphenidate (DAYTRANA) 10 mg/9hr patch APPLY 1 PATCH TOPICALLY ON THE SKIN IN THE AFTERNOON AS NEEDED. DUE 08/30/20. 07/08/20 01/04/21  Boison, Amy S, NP  methylphenidate (DAYTRANA) 10 mg/9hr patch APPLY 1 PATCH TOPICALLY ON THE SKIN IN THE AFTERNOON AS NEEDE. DUE 08/04/20. 07/08/20 01/04/21  Boison, Amy S, NP  methylphenidate (DAYTRANA) 10 mg/9hr patch APPLY 1 PATCH TOPICALLY ON THE SKIN IN THE AFTERNOON AS NEEDED/ DUE 07/08/20. 07/08/20 01/04/21    methylphenidate (DAYTRANA) 10 mg/9hr patch APPLY 1 PATCH TOPICALLY ONTO THE SKIN IN THE AFTERNOON AS NEEDED. DUE 04/22/20. 04/15/20 10/12/20  Boison, Amy S, NP  methylphenidate (DAYTRANA) 10 mg/9hr patch APPLY 1 PATCH ONTO THE SKIN TOPICALLY IN THE AFTERNOON AS NEEDED. DUE 05/19/20. 04/15/20 10/12/20  Boison, Amy S, NP  methylphenidate (DAYTRANA) 10 mg/9hr patch APPLY 1 PATCH TOPICALLY ONTO THE SKIN IN THE AFTERNOON AS NEEDED. DUE 06/12/20. 04/15/20   Boison, Amy S, NP  methylphenidate (DAYTRANA) 10 mg/9hr patch APPLY 1 PATCH ONTO THE SKIN IN THE AFTERNOON AS NEEDED.  DUE 03/12/20. 01/15/20 07/13/20  Boison, Amy S, NP  methylphenidate (DAYTRANA) 10 mg/9hr patch APPLY 1 PATCH ONTO THE SKIN IN THE AFTERNOON AS NEEDED. 01/15/20 07/13/20  Boison, Amy S, NP  methylphenidate (DAYTRANA) 10 mg/9hr patch APPLY 1 PATCH ONTO THE SKIN IN THE AFTERNOON AS NEEDED. DUE 01/19/20. 01/15/20 07/13/20  Boison, Amy S, NP  methylphenidate (DAYTRANA) 10 mg/9hr Place 1 patch onto the skin daily as needed (84m daily/10 mg as a prn). Take if extra if needed for ADHD. Wear patch for 9 hours only each day    [provider]  methylphenidate (DAYTRANA) 15 mg/9hr Place 1 patch onto the skin daily. wear patch for 9 hours only each day    [provider]  methylphenidate (DAYTRANA) 15 mg/9hr APPLY 1 PATCH TOPICALLY ON THE SKIN ONCE DAILY. DUE 08/30/20. 07/08/20 01/04/21  Boison, Amy S, NP  methylphenidate (DAYTRANA) 15 mg/9hr APPLY 1 PATCH TOPCIALLY ON  THE SKIN ONCE DAILY. DUE 08/04/20. 07/08/20 01/04/21    methylphenidate (DAYTRANA) 15 mg/9hr APPLY 1 PATCH ON SKIN DAILY. DUE 07/08/20. 07/08/20 01/04/21  Boison, Amy S, NP  methylphenidate (DAYTRANA) 15 mg/9hr APPLY 1 PATCH TOPICALLY ONTO THE SKIN ONCE DAILY. DUE 04/22/20. 04/15/20 10/12/20  Boison, Amy S, NP  methylphenidate (DAYTRANA) 15 mg/9hr APPLY 1 PATCH TOPICALLY ONTO THE SKIN ONCE DAILY. DUE 05/19/20. 04/15/20 10/12/20  Boison, Amy S, NP  methylphenidate (DAYTRANA) 15 mg/9hr APPLY 1 PATCH TOPICALLY ONTO THE SKIN ONCE DAILY. DUE 06/12/20. 04/15/20 10/12/20  Boison, Amy S, NP  methylphenidate (DAYTRANA) 15 mg/9hr APPLY 1 PATCH ONTO THE SKIN DAILY. DUE 03/12/20. 01/15/20 07/13/20  Boison, Amy S, NP  methylphenidate (DAYTRANA) 15 mg/9hr APPLY 1 PATCH ONTO THE SKIN DAILY. 01/15/20 07/13/20  Boison, Amy S, NP  methylphenidate (DAYTRANA) 15 mg/9hr APPLY 1 PATCH ONTO THE SKIN DAILY. DUE 01/19/20. 01/15/20 07/13/20  Boison, Amy S, NP  miconazole (MONISTAT 7) 2 % vaginal cream Use as needed    [provider]  Multiple Vitamins-Minerals (CENTRUM SILVER PO) Take 1 tablet by mouth. CENTRUM SILVER GUMMY ONE A DAY    [provider]  ondansetron (ZOFRAN ODT) 4 MG disintegrating tablet 436mODT q4 hours prn nausea/vomit 10/21/20   FlDeno EtienneDO  ondansetron (ZOFRAN) 4 MG tablet Take 1 tablet (4 mg total) by mouth every 4 (four) hours as needed for nausea or vomiting. 11/28/19   PeIrene ShipperMD  oxymetazoline (AFRIN) 0.05 % nasal spray Use 1-2 sprays each nostril every 12 hours as needed, for occasional use only    [provider]  pantoprazole (PROTONIX) 40 MG tablet Take 1 tablet (40 mg total) by mouth daily. 11/28/19   PeIrene ShipperMD  Polyethyl Glycol-Propyl Glycol (SYSTANE) 0.4-0.3 % GEL ophthalmic gel Apply to eye as needed. Patient not taking: Reported on 03/23/2020    [provider]  Polyvinyl Alcohol-Povidone ([place]) Apply to eye.    [provider]  pravastatin  (PRAVACHOL) 40 MG tablet Take 40 mg by mouth at bedtime.    [provider]  pravastatin (PRAVACHOL) 40 MG tablet TAKE 1 TABLET BY MOUTH ONCE DAILY FOR CHOLESTEROL. 05/14/20 05/14/21  Altheimer, MiLegrand ComoMD  pravastatin (PRAVACHOL) 40 MG tablet Take 1 tablet (40 mg total) by mouth daily for cholesterol 08/13/20     PREMARIN 0.45 MG tablet Take 0.45 mg by mouth daily. 01/29/20   [provider]  silver sulfADIAZINE (SILVADENE) 1 % cream Use as directed as needed for burn 04/25/17   [provider]  silver sulfADIAZINE (SILVADENE) 1 % cream APPLY TO THE AFFECTED AREA(S) AS DIRECTED/NEEDED FOR BURN 03/26/20 03/26/21  Altheimer, Legrand Como, MD  silver sulfADIAZINE (SILVADENE) 1 % cream APPLY TO THE AFFECTED AREA(S) AS NEEDED FOR BURN AS DIRECTED 08/13/20     Sodium Sulfate-Mag Sulfate-KCl 2094-709-628 MG TABS USE AS DIRECTED 02/26/20 02/25/21  Irene Shipper, MD  SYNTHROID 25 MCG tablet TAKE 1 & 1/2 TABLETS BY MOUTH ONCE DAILY ON AN EMPTY STOMACH FOR THYROID. 05/14/20 05/14/21  Altheimer, Legrand Como, MD  SYNTHROID 25 MCG tablet Take 1.5 tablets (37.5 mcg total) by mouth daily on an empty stomach for thyroid 08/13/20     testosterone (ANDROGEL) 50 MG/5GM (1%) GEL Use as directed 3 times a week as needed    [provider]  traZODone (DESYREL) 50 MG tablet Take 50-100 mg by mouth at bedtime.     [provider]  traZODone (DESYREL) 50 MG tablet TAKE 1-2 TABLETS BY MOUTH AT BEDTIME AS NEEDED FOR SLEEP 07/08/20 07/08/21  Boison, Amy S, NP  triamcinolone (KENALOG) 0.1 % Apply topically in the morning and at bedtime.    [provider]  Vitamin D, Ergocalciferol, (DRISDOL) 50000 units CAPS capsule Take 50,000 Units by mouth every 7 (seven) days.    [provider]    Allergies    Betadine [povidone iodine], Fentanyl, Morphine and related, Bupropion hcl, Codeine, Demerol [meperidine], Dextromethorphan, Guaifenesin & derivatives, Lipitor [atorvastatin], Lovaza  [omega-3-acid ethyl esters], Lyrica [pregabalin], Naltrexone, Percodan [oxycodone-aspirin], Prilosec [omeprazole], Topamax [topiramate], Valium [diazepam], Versed [midazolam], Adhesive [tape], Concerta [methylphenidate], Doxycycline, Lamotrigine, Nickel, Oxycodone-acetaminophen, Percocet [oxycodone-acetaminophen], and Tegaderm ag mesh [silver]  Review of Systems   Review of Systems  Constitutional:  Positive for chills and fever.  HENT:  Negative for congestion and rhinorrhea.   Eyes:  Negative for redness and visual disturbance.  Respiratory:  Positive for cough and shortness of breath. Negative for wheezing.   Cardiovascular:  Negative for chest pain and palpitations.  Gastrointestinal:  Negative for nausea and vomiting.  Genitourinary:  Negative for dysuria and urgency.  Musculoskeletal:  Positive for myalgias. Negative for arthralgias.  Skin:  Negative for pallor and wound.  Neurological:  Positive for headaches. Negative for dizziness.   Physical Exam Updated Vital Signs BP 128/81 (BP Location: Right Arm)   Pulse 99   Temp 98.1 F (36.7 C) (Tympanic)   Resp 20   Ht 4' 11.75" (1.518 m)   Wt 81.6 kg   SpO2 96%   BMI 35.45 kg/m   Physical Exam Vitals and nursing note reviewed.  Constitutional:      General: She is not in acute distress.    Appearance: She is well-developed. She is not diaphoretic.  HENT:     Head: Normocephalic and atraumatic.  Eyes:     Pupils: Pupils are equal, round, and reactive to light.  Cardiovascular:     Rate and Rhythm: Normal rate and regular rhythm.     Heart sounds: No murmur heard.   No friction rub. No gallop.  Pulmonary:     Effort: Pulmonary effort is normal.     Breath sounds: No wheezing or rales.  Abdominal:     General: There is no distension.     Palpations: Abdomen is soft.     Tenderness: There is no abdominal tenderness.  Musculoskeletal:        General: No tenderness.     Cervical back: Normal range of motion and neck  supple.  Skin:    General: Skin is  warm and dry.  Neurological:     Mental Status: She is alert and oriented to person, place, and time.  Psychiatric:        Behavior: Behavior normal.    ED Results / Procedures / Treatments   Labs (all labs ordered are listed, but only abnormal results are displayed) Labs Reviewed - No data to display  EKG None  Radiology DG Chest [facility] 1 View  Result Date: 10/21/2020 CLINICAL DATA:  COVID-19 positive, cough, sore throat, nausea and emesis with headache EXAM: PORTABLE CHEST 1 VIEW COMPARISON:  Radiograph 06/17/2015 FINDINGS: Bandlike opacities in the lung bases, likely atelectasis or scarring. Lungs are otherwise clear. No consolidation, features of edema, pneumothorax, or effusion. Stable cardiomediastinal contours with a calcified aorta. No acute osseous or soft tissue abnormality. Degenerative changes are present in the imaged spine and shoulders. Prior thoracolumbar imaging, incompletely visualized. IMPRESSION: Few bandlike opacities in the lung bases, may reflect atelectasis or scarring without other acute cardiopulmonary abnormality. Aortic Atherosclerosis (ICD10-I70.0). Electronically Signed   By: Lovena Le M.D.   On: 10/21/2020 21:24    Procedures Procedures   Medications Ordered in ED Medications  benzonatate (TESSALON) capsule 200 mg (has no administration in time range)  ondansetron (ZOFRAN-ODT) disintegrating tablet 4 mg (has no administration in time range)    ED Course  I have reviewed the triage vital signs and the nursing notes.  Pertinent labs & imaging results that were available during my care of the patient were reviewed by me and considered in my medical decision making (see chart for details).    MDM Rules/Calculators/A&P                          75 yo F with a chief complaints of having the coronavirus.  Cough congestion fevers chills myalgias headache nausea vomiting going on for about 4 days now.  Her husband was  concerned about her work of breathing initially.  Clear lung sounds on my exam chest x-ray viewed by me without focal infiltrate.  Not hypoxic here.  I discussed different at home therapies and offered monoclonal antibody infusion.  I did review possible Paxlovid with the pharmacist and unfortunately she had multiple drug interactions and so was unable to be a candidate for this.  The patient and husband elect to not start any antiviral medicine specific for this.  No follow-up with her family doctor.  10:58 PM:  I have discussed the diagnosis/risks/treatment options with the patient and believe the pt to be eligible for discharge home to follow-up with PCP. We also discussed returning to the ED immediately if new or worsening sx occur. We discussed the sx which are most concerning (e.g., sudden worsening pain, fever, inability to tolerate by mouth) that necessitate immediate return. Medications administered to the patient during their visit and any new prescriptions provided to the patient are listed below.  Medications given during this visit Medications  benzonatate (TESSALON) capsule 200 mg (has no administration in time range)  ondansetron (ZOFRAN-ODT) disintegrating tablet 4 mg (has no administration in time range)     The patient appears reasonably screen and/or stabilized for discharge and I doubt any other medical condition or other [facility] requiring further screening, evaluation, or treatment in the ED at this time prior to discharge.   Final Clinical Impression(s) / ED Diagnoses Final diagnoses:  [id number]    Rx / DC Orders ED Discharge Orders  Ordered    benzonatate (TESSALON) 100 MG capsule  Every 8 hours,   Status:  Discontinued        10/21/20 2251    ondansetron (ZOFRAN ODT) 4 MG disintegrating tablet  Status:  Discontinued        10/21/20 2251    benzonatate (TESSALON) 100 MG capsule  Every 8 hours        10/21/20 2252    ondansetron (ZOFRAN ODT) 4 MG disintegrating  tablet        10/21/20 2252             Deno Etienne, DO 10/21/20 2258

## 2020-10-21 NOTE — ED Triage Notes (Signed)
Patient reports to the ER for COVID+ with cough, sore throat, nausea, emesis, headache.

## 2020-10-22 ENCOUNTER — Other Ambulatory Visit (HOSPITAL_COMMUNITY): Payer: Self-pay

## 2020-10-27 ENCOUNTER — Other Ambulatory Visit (HOSPITAL_COMMUNITY): Payer: Self-pay

## 2020-10-27 DIAGNOSIS — G894 Chronic pain syndrome: Secondary | ICD-10-CM | POA: Diagnosis not present

## 2020-10-27 DIAGNOSIS — F900 Attention-deficit hyperactivity disorder, predominantly inattentive type: Secondary | ICD-10-CM | POA: Diagnosis not present

## 2020-10-27 MED ORDER — METHYLPHENIDATE 10 MG/9HR TD PTCH
MEDICATED_PATCH | TRANSDERMAL | 0 refills | Status: DC
  Filled 2020-10-27 – 2020-11-04 (×2): qty 30, 30d supply, fill #0

## 2020-10-27 MED ORDER — METHYLPHENIDATE 15 MG/9HR TD PTCH: MEDICATED_PATCH | TRANSDERMAL | 0 refills | Status: DC

## 2020-10-27 MED ORDER — LEXAPRO 20 MG PO TABS
20.0000 mg | ORAL_TABLET | Freq: Every day | ORAL | 1 refills | Status: DC
  Filled 2020-10-27 – 2020-11-04 (×2): qty 90, 90d supply, fill #0
  Filled 2021-02-10: qty 90, 90d supply, fill #1

## 2020-10-27 MED ORDER — METHYLPHENIDATE 10 MG/9HR TD PTCH: MEDICATED_PATCH | TRANSDERMAL | 0 refills | Status: DC

## 2020-10-27 MED ORDER — DEPLIN 15 15-90.314 MG PO CAPS
1.0000 | ORAL_CAPSULE | Freq: Every day | ORAL | 1 refills | Status: DC
  Filled 2020-10-27: qty 90, 90d supply, fill #0
  Filled 2020-11-04 – 2020-11-05 (×2): qty 30, 30d supply, fill #0
  Filled 2020-12-03: qty 30, 30d supply, fill #1
  Filled 2021-01-26: qty 30, 30d supply, fill #2

## 2020-10-27 MED ORDER — TRAZODONE HCL 50 MG PO TABS
50.0000 mg | ORAL_TABLET | Freq: Every evening | ORAL | 1 refills | Status: DC | PRN
  Filled 2020-10-27: qty 180, 90d supply, fill #0

## 2020-10-27 MED ORDER — METHYLPHENIDATE 15 MG/9HR TD PTCH
MEDICATED_PATCH | TRANSDERMAL | 0 refills | Status: DC
  Filled 2020-10-27 – 2020-11-04 (×2): qty 30, 30d supply, fill #0

## 2020-10-27 MED FILL — Estrogens, Conjugated Tab 0.45 MG: ORAL | 90 days supply | Qty: 90 | Fill #1 | Status: CN

## 2020-10-28 ENCOUNTER — Other Ambulatory Visit (HOSPITAL_COMMUNITY): Payer: Self-pay

## 2020-11-03 ENCOUNTER — Other Ambulatory Visit: Payer: 59 | Admitting: Internal Medicine

## 2020-11-03 ENCOUNTER — Other Ambulatory Visit: Payer: Self-pay

## 2020-11-03 DIAGNOSIS — M4306 Spondylolysis, lumbar region: Secondary | ICD-10-CM | POA: Diagnosis not present

## 2020-11-03 DIAGNOSIS — M797 Fibromyalgia: Secondary | ICD-10-CM

## 2020-11-03 DIAGNOSIS — R5382 Chronic fatigue, unspecified: Secondary | ICD-10-CM

## 2020-11-03 DIAGNOSIS — M419 Scoliosis, unspecified: Secondary | ICD-10-CM | POA: Diagnosis not present

## 2020-11-03 DIAGNOSIS — G894 Chronic pain syndrome: Secondary | ICD-10-CM

## 2020-11-03 DIAGNOSIS — R7303 Prediabetes: Secondary | ICD-10-CM

## 2020-11-03 DIAGNOSIS — E785 Hyperlipidemia, unspecified: Secondary | ICD-10-CM

## 2020-11-03 DIAGNOSIS — E039 Hypothyroidism, unspecified: Secondary | ICD-10-CM

## 2020-11-03 DIAGNOSIS — R7302 Impaired glucose tolerance (oral): Secondary | ICD-10-CM

## 2020-11-03 DIAGNOSIS — I10 Essential (primary) hypertension: Secondary | ICD-10-CM

## 2020-11-03 DIAGNOSIS — K219 Gastro-esophageal reflux disease without esophagitis: Secondary | ICD-10-CM | POA: Diagnosis not present

## 2020-11-03 DIAGNOSIS — E782 Mixed hyperlipidemia: Secondary | ICD-10-CM

## 2020-11-03 DIAGNOSIS — K5790 Diverticulosis of intestine, part unspecified, without perforation or abscess without bleeding: Secondary | ICD-10-CM

## 2020-11-03 DIAGNOSIS — G43909 Migraine, unspecified, not intractable, without status migrainosus: Secondary | ICD-10-CM

## 2020-11-03 DIAGNOSIS — M159 Polyosteoarthritis, unspecified: Secondary | ICD-10-CM

## 2020-11-03 DIAGNOSIS — N951 Menopausal and female climacteric states: Secondary | ICD-10-CM

## 2020-11-03 DIAGNOSIS — H04129 Dry eye syndrome of unspecified lacrimal gland: Secondary | ICD-10-CM

## 2020-11-03 DIAGNOSIS — E559 Vitamin D deficiency, unspecified: Secondary | ICD-10-CM

## 2020-11-03 DIAGNOSIS — F39 Unspecified mood [affective] disorder: Secondary | ICD-10-CM

## 2020-11-03 DIAGNOSIS — M21611 Bunion of right foot: Secondary | ICD-10-CM

## 2020-11-03 DIAGNOSIS — R41840 Attention and concentration deficit: Secondary | ICD-10-CM

## 2020-11-03 DIAGNOSIS — M21612 Bunion of left foot: Secondary | ICD-10-CM

## 2020-11-03 NOTE — Addendum Note (Signed)
Addended by: Mady Haagensen on: 11/03/2020 09:42 AM   Modules accepted: Orders

## 2020-11-03 NOTE — Addendum Note (Signed)
Addended by: Mady Haagensen on: 11/03/2020 10:25 AM   Modules accepted: Orders

## 2020-11-03 NOTE — Addendum Note (Signed)
Addended by: Mady Haagensen on: 11/03/2020 12:18 PM   Modules accepted: Orders

## 2020-11-03 NOTE — Addendum Note (Signed)
Addended by: Mady Haagensen on: 11/03/2020 10:24 AM   Modules accepted: Orders

## 2020-11-04 ENCOUNTER — Other Ambulatory Visit (HOSPITAL_COMMUNITY): Payer: Self-pay

## 2020-11-04 LAB — MICROALBUMIN / CREATININE URINE RATIO
Creatinine, Urine: 93 mg/dL (ref 20–275)
Microalb Creat Ratio: 15 mcg/mg creat (ref <30)
Microalb, Ur: 1.4 mg/dL

## 2020-11-04 LAB — CBC WITH DIFFERENTIAL/PLATELET
Absolute Monocytes: 554 {cells}/uL (ref 200–950)
Basophils Absolute: 44 {cells}/uL (ref 0–200)
Basophils Relative: 0.5 %
Eosinophils Absolute: 114 {cells}/uL (ref 15–500)
Eosinophils Relative: 1.3 %
HCT: 42.5 % (ref 35.0–45.0)
Hemoglobin: 13.7 g/dL (ref 11.7–15.5)
Lymphs Abs: 3221 {cells}/uL (ref 850–3900)
MCH: 28.1 pg (ref 27.0–33.0)
MCHC: 32.2 g/dL (ref 32.0–36.0)
MCV: 87.1 fL (ref 80.0–100.0)
MPV: 9.8 fL (ref 7.5–12.5)
Monocytes Relative: 6.3 %
Neutro Abs: 4866 {cells}/uL (ref 1500–7800)
Neutrophils Relative %: 55.3 %
Platelets: 307 Thousand/uL (ref 140–400)
RBC: 4.88 Million/uL (ref 3.80–5.10)
RDW: 14.1 % (ref 11.0–15.0)
Total Lymphocyte: 36.6 %
WBC: 8.8 Thousand/uL (ref 3.8–10.8)

## 2020-11-04 LAB — HEMOGLOBIN A1C
Hgb A1c MFr Bld: 6.2 %{Hb} — ABNORMAL HIGH
Mean Plasma Glucose: 131 mg/dL
eAG (mmol/L): 7.3 mmol/L

## 2020-11-04 LAB — COMPLETE METABOLIC PANEL WITHOUT GFR
AG Ratio: 1.3 (calc) (ref 1.0–2.5)
ALT: 11 U/L (ref 6–29)
AST: 16 U/L (ref 10–35)
Albumin: 3.9 g/dL (ref 3.6–5.1)
Alkaline phosphatase (APISO): 87 U/L (ref 37–153)
BUN: 18 mg/dL (ref 7–25)
CO2: 28 mmol/L (ref 20–32)
Calcium: 9.9 mg/dL (ref 8.6–10.4)
Chloride: 102 mmol/L (ref 98–110)
Creat: 0.63 mg/dL (ref 0.60–1.00)
Globulin: 2.9 g/dL (ref 1.9–3.7)
Glucose, Bld: 103 mg/dL — ABNORMAL HIGH (ref 65–99)
Potassium: 4.4 mmol/L (ref 3.5–5.3)
Sodium: 140 mmol/L (ref 135–146)
Total Bilirubin: 0.3 mg/dL (ref 0.2–1.2)
Total Protein: 6.8 g/dL (ref 6.1–8.1)
eGFR: 92 mL/min/1.73m2

## 2020-11-04 LAB — FERRITIN: Ferritin: 18 ng/mL (ref 16–288)

## 2020-11-04 LAB — VITAMIN D 25 HYDROXY (VIT D DEFICIENCY, FRACTURES): Vit D, 25-Hydroxy: 101 ng/mL — ABNORMAL HIGH (ref 30–100)

## 2020-11-04 LAB — LIPID PANEL
Cholesterol: 172 mg/dL (ref <200)
HDL: 67 mg/dL (ref >=50)
LDL Cholesterol (Calc): 75 mg/dL (calc)
Non-HDL Cholesterol (Calc): 105 mg/dL (calc) (ref <130)
Total CHOL/HDL Ratio: 2.6 (calc) (ref <5.0)
Triglycerides: 201 mg/dL — ABNORMAL HIGH (ref <150)

## 2020-11-04 LAB — C-REACTIVE PROTEIN: CRP: 16.6 mg/L — ABNORMAL HIGH (ref <8.0)

## 2020-11-04 LAB — TSH: TSH: 4 mIU/L (ref 0.40–4.50)

## 2020-11-04 LAB — T4, FREE: Free T4: 1.1 ng/dL (ref 0.8–1.8)

## 2020-11-04 LAB — VITAMIN B12: Vitamin B-12: 911 pg/mL (ref 200–1100)

## 2020-11-04 MED FILL — Estrogens, Conjugated Tab 0.45 MG: ORAL | 90 days supply | Qty: 90 | Fill #1 | Status: AC

## 2020-11-05 ENCOUNTER — Other Ambulatory Visit: Payer: Self-pay

## 2020-11-05 ENCOUNTER — Ambulatory Visit (INDEPENDENT_AMBULATORY_CARE_PROVIDER_SITE_OTHER): Payer: 59 | Admitting: Internal Medicine

## 2020-11-05 ENCOUNTER — Encounter: Payer: Self-pay | Admitting: Internal Medicine

## 2020-11-05 ENCOUNTER — Other Ambulatory Visit (HOSPITAL_COMMUNITY): Payer: Self-pay

## 2020-11-05 VITALS — BP 110/80 | HR 82 | Ht 58.25 in | Wt 180.0 lb

## 2020-11-05 DIAGNOSIS — F900 Attention-deficit hyperactivity disorder, predominantly inattentive type: Secondary | ICD-10-CM | POA: Diagnosis not present

## 2020-11-05 DIAGNOSIS — Z0000 Encounter for general adult medical examination without abnormal findings: Secondary | ICD-10-CM

## 2020-11-05 DIAGNOSIS — G8929 Other chronic pain: Secondary | ICD-10-CM | POA: Diagnosis not present

## 2020-11-05 DIAGNOSIS — Z8669 Personal history of other diseases of the nervous system and sense organs: Secondary | ICD-10-CM | POA: Diagnosis not present

## 2020-11-05 DIAGNOSIS — M549 Dorsalgia, unspecified: Secondary | ICD-10-CM

## 2020-11-05 DIAGNOSIS — R7302 Impaired glucose tolerance (oral): Secondary | ICD-10-CM | POA: Diagnosis not present

## 2020-11-05 DIAGNOSIS — E781 Pure hyperglyceridemia: Secondary | ICD-10-CM

## 2020-11-05 DIAGNOSIS — M797 Fibromyalgia: Secondary | ICD-10-CM

## 2020-11-05 DIAGNOSIS — Z96651 Presence of right artificial knee joint: Secondary | ICD-10-CM | POA: Diagnosis not present

## 2020-11-05 DIAGNOSIS — Z8639 Personal history of other endocrine, nutritional and metabolic disease: Secondary | ICD-10-CM | POA: Diagnosis not present

## 2020-11-05 LAB — POCT URINALYSIS DIPSTICK
Appearance: NEGATIVE
Bilirubin, UA: NEGATIVE
Blood, UA: NEGATIVE
Glucose, UA: NEGATIVE
Ketones, UA: NEGATIVE
Leukocytes, UA: NEGATIVE
Nitrite, UA: NEGATIVE
Odor: NEGATIVE
Protein, UA: NEGATIVE
Spec Grav, UA: 1.01 (ref 1.010–1.025)
Urobilinogen, UA: 0.2 E.U./dL
pH, UA: 6 (ref 5.0–8.0)

## 2020-11-05 MED ORDER — CARESTART COVID-19 HOME TEST VI KIT
PACK | 0 refills | Status: DC
  Filled 2020-11-05: qty 4, 4d supply, fill #0

## 2020-11-05 MED FILL — Lifitegrast Ophth Soln 5%: OPHTHALMIC | 90 days supply | Qty: 180 | Fill #0 | Status: AC

## 2020-11-05 NOTE — Progress Notes (Signed)
Subjective:    Patient ID: Rita Gregory, female    DOB: 11-03-1945, 75 y.o.   MRN: [id number]  HPI 75 year old Female  presents here for the first time  today for Medicare Wellness and health maintenance exam and evaluation of medical issues.  She is married to Dr. Tillman Sers who is an Endocrinologist with [facility]. 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 She is now retired.  Mrs. Magnin has a history of osteoarthritis, fatigue, fibromyalgia syndrome, migraine headaches.  Patient has history of stress urinary incontinence.  She had a hysterectomy in 07-08-1986.  Menarche was at age 47.  Dr. Noemi Chapel is her orthopedist and she has had a total right knee arthroplasty and rotator cuff surgery.  History of torn right ACL in 08-Jul-1983.  History of scoliosis and lumbar fusion.  Failed bilateral rotator cuff surgery.  2 pregnancies and no miscarriages.  Dr. Scarlette Shorts is her Gastroenterologist.Colonoscopy and upper endoscopy done Dec 2021.Sessile serrated polyp and hyperplastic polyp removed.  Dr. Ellene Route is her neurosurgeon.Hx of scoliosis and thoracic spondylosis with myelopathy.  Sees Dr. Esmeralda Arthur  in [place] for ADD.  Dr. Jola Schmidt is Ophthalmologist. Dr. Christophe Louis is GYN.  Social history: Married.  She has 2 sons ages 40 and 39.  Younger son has history of bipolar disorder which is well controlled.  Oldest son is in excellent health.  Sister age 44 with history of bipolar disorder.  1 brother in good health.  Father died in Jul 08, 2011 with history of cardiovascular disease and myasthenia gravis.  Mother died in 33 with ovarian cancer with history of hypertension.  Patient was followed for a number of years by Dr. Elyse Hsu, endocrinologist but he has recently retired.  She is now on Vascepa 2 capsules twice daily with meals.  Has discontinued metformin ER.  Patient used the Daytrana (methylphenidate) patch for attention deficit.   Takes lansoprazole 30 mg daily for GE reflux symptoms.  Takes trazodone for sleep and brand-name Synthroid 25 mcg 1.5 tablets on empty stomach.  Uses topical Voltaren on her knees for osteoarthritis.  She takes Lexapro 20 mg daily.  Her vitamin D level is high at 101.  She takes vitamin D weekly.  Free T4 is normal at 1.1.  B12 normal.  Ferritin is low normal at 18.  CBC is normal.  She is not anemic.  She requested a C-reactive protein and it was slightly elevated at 16.6.  Normal is less then 8.0 her hemoglobin A1c is excellent at 6.2%.  Fasting glucose is 103.  BUN and creatinine are normal and her estimated GFR is 92 cc/min.  Electrolytes are normal as are serum proteins.  Liver functions are normal.  Total cholesterol is 172, HDL 67, triglycerides 201 and LDL cholesterol 75.  Her TSH is 4.    Review of Systems If Blood sugar gets a bit low she gets symptoms around 100 or slightly less.     Objective:   Physical Exam BP 110/80 pulse 82 regular pulse ox 95% weight 180 pounds BMI 37.30  Skin: warm and dry.  No cervical adenopathy.  TMs clear.  No thyromegaly.  No carotid bruits.  Chest is clear to auscultation.  Cardiac exam: Regular rate and rhythm without ectopy.  Abdomen soft nondistended without hepatosplenomegaly masses or tenderness.  No lower extremity pitting edema.  Neuro is intact without gross focal deficits on brief neurological exam.     Assessment &  Plan:   Attention deficit disorder treated with Daytrana patch and seen by Dr. Ladene Artist in [facility]  Failed bilateral rotator cuff surgeries  History of scoliosis and lumbar fusion  Attention deficit disorder  Chronic back pain-history of scoliosis and lumbar fusion  History of total right knee arthroplasty  Hypothyroidism-previously followed by Dr. Elyse Hsu who has retired.  Recommending increase dose of Synthroid slightly and follow-up in 3 months.  Fibromyalgia  History of migraine  headaches  Fatigue  Hypertriglyceridemia  Impaired glucose tolerance  Musculoskeletal pain  Nocturia  History of hysterectomy 1988  History of surgery for torn right anterior cruciate ligament 1985  Plan: Slight adjustment in thyroid replacement medication and follow-up in 3 months.

## 2020-11-11 DIAGNOSIS — M25562 Pain in left knee: Secondary | ICD-10-CM | POA: Diagnosis not present

## 2020-11-11 DIAGNOSIS — M25561 Pain in right knee: Secondary | ICD-10-CM | POA: Diagnosis not present

## 2020-11-11 DIAGNOSIS — M1712 Unilateral primary osteoarthritis, left knee: Secondary | ICD-10-CM | POA: Diagnosis not present

## 2020-11-11 DIAGNOSIS — M5416 Radiculopathy, lumbar region: Secondary | ICD-10-CM | POA: Diagnosis not present

## 2020-11-12 DIAGNOSIS — M797 Fibromyalgia: Secondary | ICD-10-CM | POA: Diagnosis not present

## 2020-11-12 DIAGNOSIS — F39 Unspecified mood [affective] disorder: Secondary | ICD-10-CM | POA: Diagnosis not present

## 2020-11-12 DIAGNOSIS — F900 Attention-deficit hyperactivity disorder, predominantly inattentive type: Secondary | ICD-10-CM | POA: Diagnosis not present

## 2020-11-15 NOTE — Telephone Encounter (Signed)
Virtual visit with NP Tobe Sos.

## 2020-11-17 DIAGNOSIS — M25562 Pain in left knee: Secondary | ICD-10-CM | POA: Diagnosis not present

## 2020-11-17 DIAGNOSIS — M5416 Radiculopathy, lumbar region: Secondary | ICD-10-CM | POA: Diagnosis not present

## 2020-11-17 DIAGNOSIS — M25561 Pain in right knee: Secondary | ICD-10-CM | POA: Diagnosis not present

## 2020-11-17 DIAGNOSIS — M1712 Unilateral primary osteoarthritis, left knee: Secondary | ICD-10-CM | POA: Diagnosis not present

## 2020-11-19 DIAGNOSIS — M25561 Pain in right knee: Secondary | ICD-10-CM | POA: Diagnosis not present

## 2020-11-19 DIAGNOSIS — M5416 Radiculopathy, lumbar region: Secondary | ICD-10-CM | POA: Diagnosis not present

## 2020-11-19 DIAGNOSIS — M1712 Unilateral primary osteoarthritis, left knee: Secondary | ICD-10-CM | POA: Diagnosis not present

## 2020-11-19 DIAGNOSIS — M25562 Pain in left knee: Secondary | ICD-10-CM | POA: Diagnosis not present

## 2020-11-23 DIAGNOSIS — M25561 Pain in right knee: Secondary | ICD-10-CM | POA: Diagnosis not present

## 2020-11-23 DIAGNOSIS — M5416 Radiculopathy, lumbar region: Secondary | ICD-10-CM | POA: Diagnosis not present

## 2020-11-23 DIAGNOSIS — M25562 Pain in left knee: Secondary | ICD-10-CM | POA: Diagnosis not present

## 2020-11-23 DIAGNOSIS — M1712 Unilateral primary osteoarthritis, left knee: Secondary | ICD-10-CM | POA: Diagnosis not present

## 2020-11-25 DIAGNOSIS — M25562 Pain in left knee: Secondary | ICD-10-CM | POA: Diagnosis not present

## 2020-11-25 DIAGNOSIS — M5416 Radiculopathy, lumbar region: Secondary | ICD-10-CM | POA: Diagnosis not present

## 2020-11-25 DIAGNOSIS — M1712 Unilateral primary osteoarthritis, left knee: Secondary | ICD-10-CM | POA: Diagnosis not present

## 2020-11-25 DIAGNOSIS — M25561 Pain in right knee: Secondary | ICD-10-CM | POA: Diagnosis not present

## 2020-12-01 DIAGNOSIS — M1712 Unilateral primary osteoarthritis, left knee: Secondary | ICD-10-CM | POA: Diagnosis not present

## 2020-12-01 DIAGNOSIS — M25562 Pain in left knee: Secondary | ICD-10-CM | POA: Diagnosis not present

## 2020-12-01 DIAGNOSIS — M25561 Pain in right knee: Secondary | ICD-10-CM | POA: Diagnosis not present

## 2020-12-01 DIAGNOSIS — M5416 Radiculopathy, lumbar region: Secondary | ICD-10-CM | POA: Diagnosis not present

## 2020-12-02 ENCOUNTER — Other Ambulatory Visit (HOSPITAL_COMMUNITY): Payer: Self-pay

## 2020-12-02 DIAGNOSIS — F900 Attention-deficit hyperactivity disorder, predominantly inattentive type: Secondary | ICD-10-CM | POA: Diagnosis not present

## 2020-12-02 DIAGNOSIS — G894 Chronic pain syndrome: Secondary | ICD-10-CM | POA: Diagnosis not present

## 2020-12-02 MED ORDER — METHYLPHENIDATE 15 MG/9HR TD PTCH
MEDICATED_PATCH | TRANSDERMAL | 0 refills | Status: DC
  Filled 2020-12-02: qty 30, 30d supply, fill #0

## 2020-12-02 MED ORDER — DEPLIN 15 15-90.314 MG PO CAPS
ORAL_CAPSULE | ORAL | 1 refills | Status: DC
  Filled 2020-12-02: qty 90, 90d supply, fill #0

## 2020-12-02 MED ORDER — METHYLPHENIDATE 20 MG/9HR TD PTCH
MEDICATED_PATCH | TRANSDERMAL | 0 refills | Status: DC
  Filled 2020-12-02: qty 30, 30d supply, fill #0

## 2020-12-02 MED ORDER — LEXAPRO 20 MG PO TABS
20.0000 mg | ORAL_TABLET | Freq: Every day | ORAL | 1 refills | Status: DC
  Filled 2020-12-02: qty 90, 90d supply, fill #0

## 2020-12-02 MED ORDER — METHYLPHENIDATE 20 MG/9HR TD PTCH
MEDICATED_PATCH | TRANSDERMAL | 0 refills | Status: DC
  Filled 2020-12-02 (×3): qty 30, 30d supply, fill #0

## 2020-12-02 MED ORDER — TRAZODONE HCL 50 MG PO TABS
50.0000 mg | ORAL_TABLET | Freq: Every evening | ORAL | 1 refills | Status: DC | PRN
  Filled 2020-12-02: qty 180, 90d supply, fill #0

## 2020-12-03 ENCOUNTER — Ambulatory Visit: Payer: 59 | Admitting: Internal Medicine

## 2020-12-03 ENCOUNTER — Other Ambulatory Visit (HOSPITAL_COMMUNITY): Payer: Self-pay

## 2020-12-03 DIAGNOSIS — M25561 Pain in right knee: Secondary | ICD-10-CM | POA: Diagnosis not present

## 2020-12-03 DIAGNOSIS — M5416 Radiculopathy, lumbar region: Secondary | ICD-10-CM | POA: Diagnosis not present

## 2020-12-03 DIAGNOSIS — M25562 Pain in left knee: Secondary | ICD-10-CM | POA: Diagnosis not present

## 2020-12-03 DIAGNOSIS — M1712 Unilateral primary osteoarthritis, left knee: Secondary | ICD-10-CM | POA: Diagnosis not present

## 2020-12-03 NOTE — Progress Notes (Deleted)
Patient ID: Rita Gregory, female   DOB: [birth year], 75 y.o.   MRN: [id number]  This visit occurred during the SARS-CoV-2 public health emergency.  Safety protocols were in place, including screening questions prior to the visit, additional usage of staff PPE, and extensive cleaning of exam room while observing appropriate contact time as indicated for disinfecting solutions.   HPI: Rita Gregory is a 75 y.o.-year-old female,  self-referred, for management of prediabetes and suspected hypoglycemic spells.  Reviewed HbA1c: Lab Results  Component Value Date   HGBA1C 6.2 (H) 11/03/2020   HGBA1C 5.8 (H) 11/22/2016   She was started on 500 mg of metformin ER by Dr. Elyse Hsu in 08/2020, but she recently stopped this due to concern for hypoglycemia  Pt checks her sugars *** a day and they are: - am: n/c - 2h after b'fast: n/c - before lunch: n/c - 2h after lunch: n/c - before dinner: n/c - 2h after dinner: n/c - bedtime: n/c - nighttime: n/c Lowest sugar was ***; she has hypoglycemia awareness at 70.  Highest sugar was ***.  Pt's meals are: - Breakfast: - Lunch: - Dinner: - Snacks:  - no CKD, last BUN/creatinine:  11/03/2020: ACR 15 Lab Results  Component Value Date   BUN 18 11/03/2020   BUN 21 11/22/2016   CREATININE 0.63 11/03/2020   CREATININE 0.69 11/22/2016  Not on ACE inhibitor/ARB.  -+ HL last set of lipids: Lab Results  Component Value Date   CHOL 172 11/03/2020   HDL 67 11/03/2020   LDLCALC 75 11/03/2020   TRIG 201 (H) 11/03/2020   CHOLHDL 2.6 11/03/2020  On pravastatin 40 mg daily and Vascepa.  - last eye exam was in ***. No DR.   - no numbness and tingling in her feet.  Pt has FH of DM in ***.  She also has hypothyroidism:  Pt is on levothyroxine 37.5 mcg daily, taken: - in am - fasting - at least 30 min from b'fast - no calcium - no iron - no multivitamins - no PPIs - not on Biotin  TFTs reviewed: Lab Results   Component Value Date   TSH 4.00 11/03/2020   TSH 2.17 11/22/2016   She has a history of a tiny thyroid nodule: 08/30/2007: Thyroid U/S: Findings: Thyroid gland is lower limits of normal in size with the  right lobe measuring 3.3 cm long X 1.2 cm AP X 1.1 cm wide.  Left  lobe measures 3.9 cm long X 1.0 cm AP X 1.3 cm wide.  Isthmus  measures 2 mm AP.  Thyroid echotexture is homogeneous.  At the  medial mid left lobe the is cystic focus measuring 5 mm long X 3 mm  AP X 3 mm wide containing tiny anterior nonshadowing mural  echogenicity.     IMPRESSION:  1. Solitary 5 mm slightly complex cyst at the medial mid left lobe  thyroid favoring cystic adenoma.  2.  Otherwise, negative.   Pt denies: - feeling nodules in neck - hoarseness - dysphagia - choking - SOB with lying down  She has a history of vitamin D deficiency:  Latest vitamin D level was higher than normal: Lab Results  Component Value Date   VD25OH 101 (H) 11/03/2020   VD25OH 69 11/22/2016   Previously on 50,000 units ergocalciferol daily.  This is managed by PCP.  Recent B12 level reviewed and this was normal: Lab Results  Component Value Date   VITAMINB12 911 11/03/2020   Of note, she  also has a history of: ADHD-on Adderall Chronic pain syndrome  chronic fatigue  fibromyalgia -sees rheumatology GERD Hypertension Back pain and shoulder pain, status post spinal surgery Osteoarthritis  Migraines Dry eyes-on Xiidra   ROS: Constitutional: no weight gain, no weight loss, no fatigue, no subjective hyperthermia, no subjective hypothermia, no nocturia Eyes: no blurry vision, no xerophthalmia ENT: no sore throat, + see HPI no tinnitus, no hypoacusis Cardiovascular: no CP, no SOB, no palpitations, no leg swelling Respiratory: no cough, no SOB, no wheezing Gastrointestinal: no N, no V, no D, no C, no acid reflux Musculoskeletal: no muscle, no joint aches Skin: no rash, no hair loss Neurological: no tremors,  no numbness or tingling/no dizziness/no HAs Psychiatric: no depression, no anxiety  PE: There were no vitals taken for this visit. Wt Readings from Last 3 Encounters:  11/05/20 180 lb (81.6 kg)  10/21/20 180 lb (81.6 kg)  03/23/20 180 lb (81.6 kg)   Constitutional: overweight, in NAD Eyes: PERRLA, EOMI, no exophthalmos ENT: moist mucous membranes, no thyromegaly, no cervical lymphadenopathy Cardiovascular: RRR, No MRG Respiratory: CTA B Gastrointestinal: abdomen soft, NT, ND, BS+ Musculoskeletal: no deformities, strength intact in all 4 Skin: moist, warm, no rashes Neurological: no tremor with outstretched hands, DTR normal in all 4  ASSESSMENT: 1.  Prediabetes  2.  Idiopathic postprandial syndrome  3.  Hyperlipidemia  PLAN:  1. Patient with history of impaired glucose tolerance since 2002, with HbA1c levels in the prediabetic range.  Her highest HbA1c before this year was in 2008, at 6.1%.  However, she had another HbA1c in 04/2020 was higher, at 6.2%.  A repeat HbA1c obtained last month was the same.   -She was previously on metformin low-dose, 500 mg daily started in 08/2020 by her previous endocrinologist, but this was stopped due to presumed hypoglycemic episodes.  Of note, she started a CGM in 2021 and this was an eye opener and demonstrated that her episodes of transient hypoglycemia were not, in fact, related to low blood sugars.  At last visit with Dr. Elyse Hsu in 08/2020, she was advised to resume the CGM. - I suggested to:  There are no Patient Instructions on file for this visit. - Strongly advised her to start checking sugars at different times of the day - check 1x a day, rotating checks - discussed about CBG targets for treatment: 80-115 mg/dL before meals and <140-150 mg/dL after meals; target HbA1c <6.5%. - given foot care handout and explained the principles  - given instructions for hypoglycemia management "15-15 rule"  - advised for yearly eye exams  - Return  to clinic in 3-4 mo    2.  Idiopathic postprandial syndrome -She describes spells with weakness, dizziness, burning sensation in her stomach, which occasionally resolve with taking glucose -However, these episodes were not associated with low blood sugars.  She occasionally has dizzy spells occurring when her blood sugars are at 100, and she eats to prevent this to happen.  I explained that this is a normal blood sugar but she may have increased sensitivity to the fluctuations in blood sugars.   -Discussed the need to eat lower glycemic index foods, not have fluids liquids with meals but separate them by 30 minutes from the meal, and start the meal with protein and fat in and with carbs -Discussed about caring glucose tablets with her to have in hand and take 1 to 2 tablets if she feels that her sugars are dropping -Of note, she has an apple watch  with a mid alert feature -She saw neurology in the past and no etiology was found.  Previous MRI normal. -She will see neurology again to rule out myasthenia gravis  3.  Hyperlipidemia - Reviewed latest lipid panel from last month: Triglycerides slightly high, otherwise normal Lab Results  Component Value Date   CHOL 172 11/03/2020   HDL 67 11/03/2020   LDLCALC 75 11/03/2020   TRIG 201 (H) 11/03/2020   CHOLHDL 2.6 11/03/2020  - Continues pravastatin 40 mg daily and Vascepa without side effects.   Philemon Kingdom, MD PhD [facility] End[facility]

## 2020-12-04 ENCOUNTER — Other Ambulatory Visit (HOSPITAL_COMMUNITY): Payer: Self-pay

## 2020-12-08 ENCOUNTER — Other Ambulatory Visit (HOSPITAL_COMMUNITY): Payer: Self-pay

## 2020-12-09 NOTE — Patient Instructions (Signed)
It was a pleasure to see you today. Follow up in 3 months.

## 2020-12-15 ENCOUNTER — Other Ambulatory Visit (HOSPITAL_COMMUNITY): Payer: Self-pay

## 2020-12-15 ENCOUNTER — Telehealth: Payer: Self-pay | Admitting: Internal Medicine

## 2020-12-15 MED ORDER — SYNTHROID 25 MCG PO TABS
37.5000 ug | ORAL_TABLET | Freq: Every day | ORAL | 4 refills | Status: DC
  Filled 2020-12-15: qty 135, 90d supply, fill #0

## 2020-12-15 NOTE — Telephone Encounter (Signed)
Refill sent.

## 2020-12-15 NOTE — Telephone Encounter (Signed)
Pt called and said that she needs a refill on SYNTHROID 25 MCG tablet sent to [facility] patient [facility], she would like it for a 90D/S

## 2020-12-18 ENCOUNTER — Other Ambulatory Visit (HOSPITAL_COMMUNITY): Payer: Self-pay

## 2021-01-05 ENCOUNTER — Other Ambulatory Visit (HOSPITAL_COMMUNITY): Payer: Self-pay

## 2021-01-07 ENCOUNTER — Other Ambulatory Visit (HOSPITAL_COMMUNITY): Payer: Self-pay

## 2021-01-07 ENCOUNTER — Ambulatory Visit (INDEPENDENT_AMBULATORY_CARE_PROVIDER_SITE_OTHER): Payer: 59 | Admitting: Internal Medicine

## 2021-01-07 ENCOUNTER — Other Ambulatory Visit: Payer: Self-pay

## 2021-01-07 ENCOUNTER — Encounter: Payer: Self-pay | Admitting: Internal Medicine

## 2021-01-07 VITALS — BP 122/78 | HR 91 | Ht 58.25 in | Wt 183.0 lb

## 2021-01-07 DIAGNOSIS — E038 Other specified hypothyroidism: Secondary | ICD-10-CM

## 2021-01-07 DIAGNOSIS — R7303 Prediabetes: Secondary | ICD-10-CM | POA: Diagnosis not present

## 2021-01-07 DIAGNOSIS — E782 Mixed hyperlipidemia: Secondary | ICD-10-CM | POA: Diagnosis not present

## 2021-01-07 DIAGNOSIS — E039 Hypothyroidism, unspecified: Secondary | ICD-10-CM

## 2021-01-07 DIAGNOSIS — E063 Autoimmune thyroiditis: Secondary | ICD-10-CM | POA: Diagnosis not present

## 2021-01-07 LAB — POCT GLYCOSYLATED HEMOGLOBIN (HGB A1C): Hemoglobin A1C: 5.8 % — AB (ref 4.0–5.6)

## 2021-01-07 MED ORDER — SYNTHROID 50 MCG PO TABS
50.0000 ug | ORAL_TABLET | Freq: Every day | ORAL | 3 refills | Status: DC
  Filled 2021-01-07: qty 90, 90d supply, fill #0
  Filled 2021-04-09: qty 90, 90d supply, fill #1
  Filled 2021-08-12: qty 90, 90d supply, fill #2
  Filled 2021-11-18: qty 90, 90d supply, fill #3

## 2021-01-07 NOTE — Progress Notes (Signed)
Patient ID: Rita Gregory, female   DOB: Oct 05, 1945, 75 y.o.   MRN: [id number]  This visit occurred during the SARS-CoV-2 public health emergency.  Safety protocols were in place, including screening questions prior to the visit, additional usage of staff PPE, and extensive cleaning of exam room while observing appropriate contact time as indicated for disinfecting solutions.   HPI: Rita Gregory is a 75 y.o. female, self-referred for management of prediabetes, since early 2000s, and suspected hypoglycemic spells, Hashimoto's hypothyroidism.  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 her husband, Dr. Tillman Sers, is also  my patient. She previously saw Dr. Elyse Hsu, but he recently retired.  She recently had COVID in 10/2020 - was bedbound for 1 week.  She recovered well.  Prediabetes:  Patient describes that she has longstanding sensitivity to blood sugar variations even within the normal range.    She describes that after she had attached a freestyle libre CGM, she realized that her blood sugars did not actually drop as low as she thought when she was feeling hypoglycemic, however, she does describe significant hypoglycemic symptoms including slurring of words and disorientation when sugars go to 68-70.  When sugars get to around 80-90, she describes heartburn, vision changes, headaches, extreme fatigue, dizziness.  This happened mostly during the day, after meals.  She corrects them with 2 glucose tablets.  She also feels the needs to eat ice cream at night to raise her glucose around 115-120 when going to bed, to avoid lows overnight.  She recently had to change her sensor targets from 90 to 85 and then to 80 as it was alarming her too much as she could not sleep.  Lows blood sugars: 68, highest blood sugar, 130.  Reviewed patient's HbA1c levels: Lab Results  Component Value Date   HGBA1C 6.2 (H) 11/03/2020   HGBA1C 5.8 (H) 11/22/2016   Patient describes that she  was started on 500 mg of metformin ER by Dr. Elyse Hsu in 08/2020, but she recently stopped due to concern for hypoglycemia.  Pt is currently not on any medications for her prediabetes.  Reviewed her CGM downloads:   Pt's meals are: - Brunch: Half a cup of great grain cereal with 10 pecans and 10 for almonds, + half-and-half - Dinner: 4 to 6 ounces of meat or fish -usually present - Snacks: 1-2 -including ice cream at night  - no CKD; last BUN/creatinine:  11/03/2020: ACR 15 Lab Results  Component Value Date   BUN 18 11/03/2020   BUN 21 11/22/2016   CREATININE 0.63 11/03/2020   CREATININE 0.69 11/22/2016  She is not on ARB or ACE inhibitor.  -+ HL; last set of lipids: Lab Results  Component Value Date   CHOL 172 11/03/2020   HDL 67 11/03/2020   LDLCALC 75 11/03/2020   TRIG 201 (H) 11/03/2020   CHOLHDL 2.6 11/03/2020  On pravastatin 40 mg daily and Vascepa 2 g twice a day - just started.   - last eye exam was in 2022. No DR. + dry macular degeneration, dry eyes.    - no numbness and tingling in her feet.   Pt has FH of DM in father - DM2.   She also has hypothyroidism:   Pt is on Synthroid 50 (2x 25 mcg) daily, taken: (Generic did not work for her in the past) - at night - fasting - at least 30 min from b'fast - no calcium - no iron - no multivitamins - + PPIs at night -  not on Biotin  TFTs reviewed: Lab Results  Component Value Date   TSH 4.00 11/03/2020   TSH 2.17 11/22/2016   She also has a history of a tiny thyroid nodule: 08/30/2007: Thyroid ultrasound: Findings: Thyroid gland is lower limits of normal in size with the  right lobe measuring 3.3 cm long X 1.2 cm AP X 1.1 cm wide.  Left  lobe measures 3.9 cm long X 1.0 cm AP X 1.3 cm wide.  Isthmus  measures 2 mm AP.  Thyroid echotexture is homogeneous.  At the  medial mid left lobe the is cystic focus measuring 5 mm long X 3 mm  AP X 3 mm wide containing tiny anterior nonshadowing mural  echogenicity.      IMPRESSION:  1. Solitary 5 mm slightly complex cyst at the medial mid left lobe  thyroid favoring cystic adenoma.  2.  Otherwise, negative.    Pt denies: - feeling nodules in neck - hoarseness - dysphagia - choking - SOB with lying down  She has vitamin D deficiency-managed by PCP:  Latest vitamin D was higher than target: Lab Results  Component Value Date   VD25OH 101 (H) 11/03/2020   VD25OH 69 11/22/2016   She was previously on ergocalciferol 50,000 units weekly per PCP but switched to every 2 weeks in 10/2020.  A B12 level was normal: Lab Results  Component Value Date   VITAMINB12 911 11/03/2020   She also has a history of: ADHD-on Adderall Chronic pain syndrome Chronic fatigue Fibromyalgia-sees rheumatology -on methylphenidate patch for this and for ADD GERD HTN Back pain and shoulder pain-status post spinal surgery - Dr. Ellene Route Osteoarthritis Migraines Dry eyes-on Shirley Friar and Systain - possibly Sjogren  ROS: Constitutional: + weight gain, + fatigue, + hot flushes, + nocturia Eyes: + blurry vision, + xerophthalmia ENT: no sore throat, no nodules palpated in throat, no dysphagia/odynophagia, no hoarseness Cardiovascular: no CP/+ SOB with exertion/no palpitations/+ leg swelling Respiratory: no cough/+ SOB Gastrointestinal: no N/no V/no D/+ C, + heartburn Musculoskeletal: + muscle aches/+ joint aches Skin: + rash, + itching, no hair loss Neurological: no tremors/numbness/tingling/dizziness, + HA Psychiatric: + Intermittent depression/no anxiety + Low libido  Past Medical History:  Diagnosis Date   Allergy    Arthritis of left acromioclavicular joint    Asthma    Blood transfusion without reported diagnosis    unsure when   Cervical disc disorder with left arm radiculopathy of cervical region 04/20/2017   frequent headaches   Chronic pain    Dr. Ellene Route follows   Colon polyp 11/09/2007   Hyperplastic    Depression    Diverticulosis of colon  (without mention of hemorrhage) 11/09/2007   Dry eye    Esophageal reflux    Fibromyalgia    Hyperlipidemia    Hypertension    in the past   Hypothyroidism    Kyphoscoliosis    40 degree bend ro right side   Paroxysmal spells 02/13/2013   Patent foramen ovale    hx.- Dr. Lemmie Evens. Smith,cardiology- follows   Pre-diabetes    Controls with diet   Rotator cuff tear, non-traumatic, left    Shoulder impingement, left    Past Surgical History:  Procedure Laterality Date   ABDOMINAL HYSTERECTOMY     ANTERIOR LATERAL LUMBAR FUSION 4 LEVELS Left 06/16/2015   Procedure: ANTERIOR LATERAL LUMBAR FUSION THORACIC TWELVE-LUMBAR FOUR;  Surgeon: Kristeen Miss, MD;  Location: [facility];  Service: Neurosurgery;  Laterality: Left;  Thoracolumbar spine   APPENDECTOMY  CESAREAN SECTION     x2   CHOLECYSTECTOMY     COLONOSCOPY  03/23/2020   dry eyes     uses Restasis daily   ESOPHAGOGASTRODUODENOSCOPY  02/23/2012   Procedure: ESOPHAGOGASTRODUODENOSCOPY (EGD);  Surgeon: Irene Shipper, MD;  Location: [facility];  Service: Endoscopy;  Laterality: N/A;   JOINT REPLACEMENT     RTKA '05   KNEE ARTHROSCOPY W/ ACL RECONSTRUCTION     Rt. Knee   POSTERIOR LUMBAR FUSION 4 LEVEL N/A 06/16/2015   Procedure: POSTERIOR LUMBAR FUSION LUMBAR FOUR-FIVE LUMBAR FIVE-SACRAL ONE;  Surgeon: Kristeen Miss, MD;  Location: [facility];  Service: Neurosurgery;  Laterality: N/A;   RADIOLOGY WITH ANESTHESIA N/A 06/15/2015   Procedure: RADIOLOGY WITH ANESTHESIA;  Surgeon: Medication Radiologist, MD;  Location: [facility];  Service: Radiology;  Laterality: N/A;   right rotator cuff  2011   sx   SHOULDER ARTHROSCOPY Left 04/28/2017   Procedure: ARTHROSCOPY SHOULDER WITH EXTENSIVE DEBRIDEMENT ROTATOR CUFF TEAR;  Surgeon: Elsie Saas, MD;  Location: [facility];  Service: Orthopedics;  Laterality: Left;   UPPER GASTROINTESTINAL ENDOSCOPY  03/23/2020   Social History   Socioeconomic History   Marital status: Married     Spouse name: Legrand Como   Number of children: Not on file   Years of education: Not on file   Highest education level: Not on file  Occupational History   Occupation: Facilities manager:   Tobacco Use   Smoking status: Never    Passive exposure: Yes   Smokeless tobacco: Never   Tobacco comments:    Flight attentent for 7 years. 1966-1973 Passive smoker  Vaping Use   Vaping Use: Never used  Substance and Sexual Activity   Alcohol use: Yes    Comment: Very rarely    Drug use: No   Sexual activity: Yes    Partners: Male  Other Topics Concern   Not on file  Social History Narrative   Husband is Dr. Tillman Sers    Social Determinants of Health   Financial Resource Strain: Not on file  Food Insecurity: Not on file  Transportation Needs: Not on file  Physical Activity: Not on file  Stress: Not on file  Social Connections: Not on file  Intimate Partner Violence: Not on file   Current Outpatient Medications on File Prior to Visit  Medication Sig Dispense Refill   acetaminophen (TYLENOL) 650 MG CR tablet Take 650 mg by mouth every 8 (eight) hours as needed for pain.     albuterol (VENTOLIN HFA) 108 (90 Base) MCG/ACT inhaler Inhale 2 puffs into the lungs every 4 (four) hours as needed for wheezing 18 g 5   amoxicillin (AMOXIL) 500 MG capsule Take 4 capsules (2,000 mg total) by mouth as directed 1 hours before dental treatment 4 capsule 3   conjugated estrogens (PREMARIN) vaginal cream Place 0.625 mg vaginally daily as needed.      Continuous Blood Gluc Sensor (FREESTYLE LIBRE 2 SENSOR) MISC Inject into skin every 14 days as directed for glucose monitoring 4 each 12   Continuous Blood Gluc Sensor (FREESTYLE LIBRE 2 SENSOR) MISC Inject into the skin every 14 days. Use as directed for 14 days per sensor for glucose monitoring 12 each 4   COVID-19 At Home Antigen Test (CARESTART COVID-19 HOME TEST) KIT Use as directed. 4 each 0   EPINEPHrine 0.3 mg/0.3 mL IJ SOAJ injection Use  as directed for severe allergic reaction 2 each 6  esomeprazole (NEXIUM) 20 MG capsule Take 20-40 mg by mouth at bedtime as needed.     estrogens, conjugated, (PREMARIN) 0.45 MG tablet TAKE 1 TABLET BY MOUTH ONCE DAILY. 90 tablet 3   glucose blood test strip Use to test blood sugar 2 times daily 100 each 8   hydrocortisone 2.5 % cream as needed.     icosapent Ethyl (VASCEPA) 1 g capsule Take 2 capsules (2 g total) by mouth 2 (two) times daily with a meal. 360 capsule 4   ketoconazole (NIZORAL) 2 % cream Apply 1 application topically 2 (two) times daily as needed for irritation.      L-Methylfolate-Algae (DEPLIN 15) 15-90.314 MG CAPS Take 1 capsule by mouth daily 90 capsule 1   Lancets (FREESTYLE) lancets Use to test blood sugar twice a day 200 each 4   Lancets 30G MISC USE TO TEST BLOOD SUGAR TWICE A DAY (E11.0) 200 each 4   LEXAPRO 20 MG tablet Take 1 tablet (20 mg total) by mouth daily. BRAND NAME ONLY 90 tablet 1   LEXAPRO 20 MG tablet Take 1 tablet (20 mg total) by mouth daily. 90 tablet 1   Lifitegrast 5 % SOLN PLACE 1 DROP IN BOTH EYES TWO TIMES DAILY 180 each 99   loratadine (CLARITIN) 10 MG tablet Take 10 mg by mouth daily as needed for allergies.     meclizine (ANTIVERT) 25 MG tablet Take 25 mg by mouth 3 (three) times daily as needed for dizziness.     methylphenidate (DAYTRANA) 10 mg/9hr Place 1 patch onto the skin daily as needed (71m daily/10 mg as a prn). Take if extra if needed for ADHD. Wear patch for 9 hours only each day     methylphenidate (DAYTRANA) 15 mg/9hr APPLY 1 PATCH TOPICALLY ON THE SKIN ONCE DAILY. DUE 08/30/20. 30 patch 0   [START ON 01/24/2021] methylphenidate (DAYTRANA) 20 MG/9HR apply 1 patch daily fill 01/24/21 30 patch 0   miconazole (MONISTAT 7) 2 % vaginal cream Use as needed     Multiple Vitamins-Minerals (CENTRUM SILVER PO) Take 1 tablet by mouth. CENTRUM SILVER GUMMY ONE A DAY     ondansetron (ZOFRAN ODT) 4 MG disintegrating tablet 422mODT q4 hours prn  nausea/vomit 20 tablet 0   oxymetazoline (AFRIN) 0.05 % nasal spray Use 1-2 sprays each nostril every 12 hours as needed, for occasional use only     pantoprazole (PROTONIX) 40 MG tablet Take 1 tablet (40 mg total) by mouth daily. 90 tablet 3   Polyethyl Glycol-Propyl Glycol (SYSTANE) 0.4-0.3 % GEL ophthalmic gel Apply to eye as needed.     Polyvinyl Alcohol-Povidone (REFRESH OP) Apply to eye.     pravastatin (PRAVACHOL) 40 MG tablet TAKE 1 TABLET BY MOUTH ONCE DAILY FOR CHOLESTEROL. 90 tablet 4   pravastatin (PRAVACHOL) 40 MG tablet Take 1 tablet (40 mg total) by mouth daily for cholesterol 90 tablet 4   PREMARIN 0.45 MG tablet Take 0.45 mg by mouth daily.     silver sulfADIAZINE (SILVADENE) 1 % cream APPLY TO THE AFFECTED AREA(S) AS NEEDED FOR BURN AS DIRECTED 25 g 2   Sodium Sulfate-Mag Sulfate-KCl 14571-377-6570G TABS USE AS DIRECTED 24 tablet 0   testosterone (ANDROGEL) 50 MG/5GM (1%) GEL Use as directed 3 times a week as needed     traZODone (DESYREL) 50 MG tablet TAKE 1-2 TABLETS BY MOUTH AT BEDTIME AS NEEDED FOR SLEEP 180 tablet 1   traZODone (DESYREL) 50 MG tablet Take 1-2 tablets (50-100 mg  total) by mouth at bedtime as needed for sleep 180 tablet 1   triamcinolone (KENALOG) 0.1 % Apply topically in the morning and at bedtime.     Vitamin D, Ergocalciferol, (DRISDOL) 50000 units CAPS capsule Take 50,000 Units by mouth every 14 (fourteen) days.     No current facility-administered medications on file prior to visit.   Allergies  Allergen Reactions   Betadine [Povidone Iodine] Other (See Comments)    Blister hands and feet     Fentanyl Shortness Of Breath    Pt has tolerated this medication 06/2015 pre and during operation. MD ok giving.  Low dose only   Morphine And Related Anaphylaxis    respiratory depression    Bupropion Hcl     Dizzy, fog   Codeine Nausea And Vomiting   Demerol [Meperidine] Nausea And Vomiting   Dextromethorphan Other (See Comments)    Unknown     Guaifenesin & Derivatives     Other, unknown   Lipitor [Atorvastatin]     Myalgia   Lovaza [Omega-3-Acid Ethyl Esters] Diarrhea   Lyrica [Pregabalin]     Fatigue and depression   Naltrexone     depression   Percodan [Oxycodone-Aspirin] Nausea And Vomiting   Prilosec [Omeprazole]     Iliacs     Topamax [Topiramate]     disorientation    Valium [Diazepam]     Small doses are OK but large doses are not. Long-term use causes depression.    Versed [Midazolam]     In moderate in large doses shortness of breath, and respiratory depression    Adhesive [Tape] Rash    Skin irritation    Concerta [Methylphenidate] Palpitations   Doxycycline Rash    Pruritic rash   Lamotrigine Rash   Nickel Rash    Skin irritation    Oxycodone-Acetaminophen Nausea And Vomiting and Rash    depression   Percocet [Oxycodone-Acetaminophen] Nausea And Vomiting and Rash   Family History  Problem Relation Age of Onset   Ovarian cancer Mother        passed at age 25   Heart disease Father    Kidney disease Father        Kidney cancer   Diabetes type II Father        passed at 61 yrs old   Colon polyps Father    Myasthenia gravis Father        passed from [place] gravis   Bipolar disorder Sister        Osteopenia   Bipolar disorder Son    Esophageal cancer Neg Hx    Rectal cancer Neg Hx    Stomach cancer Neg Hx    Colon cancer Neg Hx    PE: Vitals:   01/07/21 1331  BP: 122/78  Pulse: 91  SpO2: 95%    Wt Readings from Last 3 Encounters:  01/07/21 183 lb (83 kg)  11/05/20 180 lb (81.6 kg)  10/21/20 180 lb (81.6 kg)    Constitutional: overweight, in NAD Eyes: PERRLA, EOMI, no exophthalmos ENT: moist mucous membranes, no thyromegaly, no cervical lymphadenopathy Cardiovascular: RRR, No MRG, + mild periankle edema bilaterally Respiratory: CTA B Gastrointestinal: abdomen soft, NT, ND, BS+ Musculoskeletal: no deformities, strength intact in all 4 Skin: moist, warm, no rashes Neurological:  no tremor with outstretched hands, DTR normal in all 4  ASSESSMENT: 1.  Prediabetes   2.  Idiopathic postprandial syndrome   3.  Hyperlipidemia  4.  Hashimoto's hypothyroidism   PLAN:  1.  Patient with history of impaired glucose tolerance since 2002, weight HbA1c levels in the prediabetic range.  Her highest HbA1c before this year was in 2008, at 6.1%.  However, in 04/2020 she had a higher HbA1c, at 6.2% and a repeat obtained in July was the same. -She was previously on metformin low-dose, 500 mg daily started in 08/2019 by her previous endocrinologist but this was stopped due to presumed hypoglycemic episodes.  Of note, she started a CGM in 2021 and this was an eye opener and demonstrated that her episodes of transient hypoglycemia were not, in fact, related to low blood sugars.  At last visit with Dr. Elyse Hsu in 08/2020, she was advised to resume the CGM.  She did not resume it and pays out-of-pocket for it as this is not covered by her insurance. CGM interpretation: -At today's visit, we reviewed her CGM downloads: It appears that 100% of values are in target range (goal >70%), while 0% are higher than 180 (goal <25%), and 0% are lower than 70 (goal <4%).  The calculated average blood sugar is 112.  The projected HbA1c for the next 3 months (GMI) is 6.0%. -Reviewing the CGM trends, sugars are extremely stable throughout the day, all in target range, without fluctuations. -No medication addition if needed for now -Please also see problem #2 - I suggested to:  Patient Instructions  Please continue Synthroid 50 mcg daily.  Take the thyroid hormone every day, with water, in am, at least 30 minutes before breakfast, separated by at least 4 hours from: - acid reflux medications - calcium - iron - multivitamins  Please come back for labs in 5-6 weeks.  Please come back for a follow-up appointment in 3-4 months.  - Discussed about targets for blood sugars: for her: 80-100 mg/dL before  meals and less than 140-150 mg/dL after meals along with an HbA1c of less than 6.5% ideally - we checked her HbA1c: 5.8% (lower) - advised to check sugars at different times of the day - 4x a day, rotating check times - advised for yearly eye exams >> she is UTD  - return to clinic in 4 months     2.  Idiopathic postprandial syndrome - she describes spells with weakness, dizziness, burning sensation in her stomach, which resolve with taking glucose - However, these episodes were not associated with low blood sugars.  She occasionally has dizzy spells occurring when her blood sugars are at 90, and she eats to prevent this to happen.  She may have neurologic symptoms when her sugars drop under 70.  We discussed that since these are normal sugars, it appears that she may have just an increase sensitivity to the fluctuations in the blood sugars.  Of note, she saw neurology in the past and no etiology was found for these episodes. MRI was normal.   -No medication is available for this condition, however, there are some dietary changes that she can institute to prevent large fluctuations in blood sugars: Eating low glycemic index foods, mostly focusing on solid foods, rather than semisolid or liquid, no  liquids at the time of the meal but separate them by at least 30 minutes from the meal, and start the meal with protein and fat and end with carbs -Discussed about carrying glucose tablets with her and have 1 to 2 tablets if she feels that her sugars are dropping.  She is already doing so. -We also discussed that her freestyle libre CGM may list sugars that are 10  to 20 mg/dL lower than actually measured by glucometer. -Of note, she has an apple watch with a med alert feature   3.  Hyperlipidemia - Reviewed latest lipid panel from 2 months ago: Triglycerides slightly high, otherwise at goal: Lab Results  Component Value Date   CHOL 172 11/03/2020   HDL 67 11/03/2020   LDLCALC 75 11/03/2020   TRIG 201  (H) 11/03/2020   CHOLHDL 2.6 11/03/2020  -Continues pravastatin 40 mg daily and Vascepa 2 g twice a day without side effects, which she just started-tolerated well  4.  Hashimoto's hypothyroidism - latest thyroid labs reviewed with pt. >> normal: Lab Results  Component Value Date   TSH 4.00 11/03/2020  - she continues on Synthroid d.a.w. 2x25 mcg daily-she would like to switch to a 50 mcg tablet - pt feels good on this dose. - we discussed about taking the thyroid hormone every day, with water, >30 minutes before breakfast, separated by >4 hours from acid reflux medications, calcium, iron, multivitamins. Pt. is not taking it correctly as she takes it at night.  However, she agrees to move it to the morning. - will check thyroid tests in 1.5 months after the above change: TSH and fT4 - If labs are abnormal, she will need to return for repeat TFTs in 1.5 months  - Total time spent for the visit: 1 hour, in obtaining medical information from the chart and from the patient, reviewing her  previous labs, imaging reports, and treatments, reviewing her symptoms, counseling her about her conditions (please see the discussed topics above), and developing a plan to further investigate and treat them; she had a number of questions which I addressed.   Philemon Kingdom, MD PhD [facility]

## 2021-01-07 NOTE — Patient Instructions (Addendum)
Please continue Synthroid 50 mcg daily.  Take the thyroid hormone every day, with water, in am, at least 30 minutes before breakfast, separated by at least 4 hours from: - acid reflux medications - calcium - iron - multivitamins  Please come back for labs in 5-6 weeks.  Please come back for a follow-up appointment in 3-4 months.  PATIENT INSTRUCTIONS FOR TYPE 2 DIABETES:  DIET AND EXERCISE Diet and exercise is an important part of diabetic treatment.  We recommended aerobic exercise in the form of brisk walking (working between 40-60% of maximal aerobic capacity, similar to brisk walking) for 150 minutes per week (such as 30 minutes five days per week) along with 3 times per week performing 'resistance' training (using various gauge rubber tubes with handles) 5-10 exercises involving the major muscle groups (upper body, lower body and core) performing 10-15 repetitions (or near fatigue) each exercise. Start at half the above goal but build slowly to reach the above goals. If limited by weight, joint pain, or disability, we recommend daily walking in a swimming pool with water up to waist to reduce pressure from joints while allow for adequate exercise.    BLOOD GLUCOSES Monitoring your blood glucoses is important for continued management of your diabetes. Please check your blood glucoses 2-4 times a day: fasting, before meals and at bedtime (you can rotate these measurements - e.g. one day check before the 3 meals, the next day check before 2 of the meals and before bedtime, etc.).   HYPOGLYCEMIA (low blood sugar) Hypoglycemia is usually a reaction to not eating, exercising, or taking too much insulin/ other diabetes drugs.  Symptoms include tremors, sweating, hunger, confusion, headache, etc. Treat IMMEDIATELY with 15 grams of Carbs: 4 glucose tablets  cup regular juice/soda 2 tablespoons raisins 4 teaspoons sugar 1 tablespoon honey Recheck blood glucose in 15 mins and repeat above if  still symptomatic/blood glucose <100.  RECOMMENDATIONS TO REDUCE YOUR RISK OF DIABETIC COMPLICATIONS: * Take your prescribed MEDICATION(S) * Follow a DIABETIC diet: Complex carbs, fiber rich foods, (monounsaturated and polyunsaturated) fats * AVOID saturated/trans fats, high fat foods, >2,300 mg salt per day. * EXERCISE at least 5 times a week for 30 minutes or preferably daily.  * DO NOT SMOKE OR DRINK more than 1 drink a day. * Check your FEET every day. Do not wear tightfitting shoes. Contact us if you develop an ulcer * See your EYE doctor once a year or more if needed * Get a FLU shot once a year * Get a PNEUMONIA vaccine once before and once after age 16 years  GOALS:  * Your Hemoglobin A1c of <7%  * fasting sugars need to be <130 * after meals sugars need to be <180 (2h after you start eating) * Your Systolic BP should be 193 or lower  * Your Diastolic BP should be 80 or lower  * Your HDL (Good Cholesterol) should be 40 or higher  * Your LDL (Bad Cholesterol) should be 100 or lower. * Your Triglycerides should be 150 or lower  * Your Urine microalbumin (kidney function) should be <30 * Your Body Mass Index should be 25 or lower    Please consider the following ways to cut down carbs and fat and increase fiber and micronutrients in your diet: - substitute whole grain for white bread or pasta - substitute brown rice for white rice - substitute 90-calorie flat bread pieces for slices of bread when possible - substitute sweet potatoes or yams  for white potatoes - substitute humus for margarine - substitute tofu for cheese when possible - substitute almond or rice milk for regular milk (would not drink soy milk daily due to concern for soy estrogen influence on breast cancer risk) - substitute dark chocolate for other sweets when possible - substitute water - can add lemon or orange slices for taste - for diet sodas (artificial sweeteners will trick your body that you can eat  sweets without getting calories and will lead you to overeating and weight gain in the long run) - do not skip breakfast or other meals (this will slow down the metabolism and will result in more weight gain over time)  - can try smoothies made from fruit and almond/rice milk in am instead of regular breakfast - can also try old-fashioned (not instant) oatmeal made with almond/rice milk in am - order the dressing on the side when eating salad at a restaurant (pour less than half of the dressing on the salad) - eat as little meat as possible - can try juicing, but should not forget that juicing will get rid of the fiber, so would alternate with eating raw veg./fruits or drinking smoothies - use as little oil as possible, even when using olive oil - can dress a salad with a mix of balsamic vinegar and lemon juice, for e.g. - use agave nectar, stevia sugar, or regular sugar rather than artificial sweateners - steam or broil/roast veggies  - snack on veggies/fruit/nuts (unsalted, preferably) when possible, rather than processed foods - reduce or eliminate aspartame in diet (it is in diet sodas, chewing gum, etc) Read the labels!  Try to read Dr. Janene Harvey book: "Program for Reversing Diabetes" for other ideas for healthy eating.

## 2021-01-13 ENCOUNTER — Other Ambulatory Visit (HOSPITAL_COMMUNITY): Payer: Self-pay

## 2021-01-13 ENCOUNTER — Other Ambulatory Visit: Payer: Self-pay | Admitting: Neurological Surgery

## 2021-01-13 DIAGNOSIS — M5412 Radiculopathy, cervical region: Secondary | ICD-10-CM

## 2021-01-14 ENCOUNTER — Other Ambulatory Visit (HOSPITAL_COMMUNITY): Payer: Self-pay

## 2021-01-14 MED ORDER — DIAZEPAM 10 MG PO TABS
10.0000 mg | ORAL_TABLET | ORAL | 0 refills | Status: DC
  Filled 2021-01-14: qty 1, 1d supply, fill #0

## 2021-01-14 MED ORDER — METHYLPHENIDATE 15 MG/9HR TD PTCH
15.0000 mg | MEDICATED_PATCH | TRANSDERMAL | 0 refills | Status: DC
  Filled 2021-01-14: qty 30, 30d supply, fill #0

## 2021-01-14 MED ORDER — METHYLPHENIDATE 15 MG/9HR TD PTCH: 15.0000 mg | MEDICATED_PATCH | TRANSDERMAL | 0 refills | Status: DC

## 2021-01-15 ENCOUNTER — Other Ambulatory Visit (HOSPITAL_COMMUNITY): Payer: Self-pay

## 2021-01-22 ENCOUNTER — Other Ambulatory Visit: Payer: Self-pay

## 2021-01-22 ENCOUNTER — Ambulatory Visit
Admission: RE | Admit: 2021-01-22 | Discharge: 2021-01-22 | Disposition: A | Discharge status: Home / Self Care | Payer: 59 | Source: Ambulatory Visit | Attending: Neurological Surgery | Admitting: Neurological Surgery

## 2021-01-22 DIAGNOSIS — M5412 Radiculopathy, cervical region: Secondary | ICD-10-CM

## 2021-01-22 DIAGNOSIS — M50221 Other cervical disc displacement at C4-C5 level: Secondary | ICD-10-CM | POA: Diagnosis not present

## 2021-01-25 ENCOUNTER — Other Ambulatory Visit (HOSPITAL_COMMUNITY): Payer: Self-pay

## 2021-01-25 MED ORDER — CYCLOBENZAPRINE HCL 5 MG PO TABS
5.0000 mg | ORAL_TABLET | Freq: Three times a day (TID) | ORAL | 2 refills | Status: DC
  Filled 2021-01-25: qty 30, 10d supply, fill #0

## 2021-01-26 ENCOUNTER — Other Ambulatory Visit (HOSPITAL_COMMUNITY): Payer: Self-pay

## 2021-01-27 ENCOUNTER — Other Ambulatory Visit (HOSPITAL_COMMUNITY): Payer: Self-pay

## 2021-02-02 ENCOUNTER — Ambulatory Visit: Payer: 59 | Admitting: Internal Medicine

## 2021-02-02 DIAGNOSIS — Z23 Encounter for immunization: Secondary | ICD-10-CM | POA: Diagnosis not present

## 2021-02-04 ENCOUNTER — Other Ambulatory Visit (HOSPITAL_COMMUNITY): Payer: Self-pay

## 2021-02-09 DIAGNOSIS — M4722 Other spondylosis with radiculopathy, cervical region: Secondary | ICD-10-CM | POA: Diagnosis not present

## 2021-02-09 DIAGNOSIS — M25562 Pain in left knee: Secondary | ICD-10-CM | POA: Diagnosis not present

## 2021-02-09 DIAGNOSIS — M1712 Unilateral primary osteoarthritis, left knee: Secondary | ICD-10-CM | POA: Diagnosis not present

## 2021-02-09 DIAGNOSIS — M5416 Radiculopathy, lumbar region: Secondary | ICD-10-CM | POA: Diagnosis not present

## 2021-02-10 ENCOUNTER — Other Ambulatory Visit (HOSPITAL_COMMUNITY): Payer: Self-pay

## 2021-02-10 MED FILL — Estrogens, Conjugated Tab 0.45 MG: ORAL | 90 days supply | Qty: 90 | Fill #2 | Status: AC

## 2021-02-16 DIAGNOSIS — M5416 Radiculopathy, lumbar region: Secondary | ICD-10-CM | POA: Diagnosis not present

## 2021-02-16 DIAGNOSIS — M1712 Unilateral primary osteoarthritis, left knee: Secondary | ICD-10-CM | POA: Diagnosis not present

## 2021-02-16 DIAGNOSIS — M25562 Pain in left knee: Secondary | ICD-10-CM | POA: Diagnosis not present

## 2021-02-16 DIAGNOSIS — M4722 Other spondylosis with radiculopathy, cervical region: Secondary | ICD-10-CM | POA: Diagnosis not present

## 2021-02-17 ENCOUNTER — Other Ambulatory Visit (HOSPITAL_COMMUNITY): Payer: Self-pay

## 2021-02-17 ENCOUNTER — Other Ambulatory Visit: Payer: Self-pay | Admitting: Internal Medicine

## 2021-02-17 DIAGNOSIS — E063 Autoimmune thyroiditis: Secondary | ICD-10-CM

## 2021-02-17 DIAGNOSIS — E038 Other specified hypothyroidism: Secondary | ICD-10-CM

## 2021-02-17 DIAGNOSIS — G894 Chronic pain syndrome: Secondary | ICD-10-CM | POA: Diagnosis not present

## 2021-02-17 DIAGNOSIS — E039 Hypothyroidism, unspecified: Secondary | ICD-10-CM

## 2021-02-17 DIAGNOSIS — F900 Attention-deficit hyperactivity disorder, predominantly inattentive type: Secondary | ICD-10-CM | POA: Diagnosis not present

## 2021-02-17 MED ORDER — DAYTRANA 10 MG/9HR TD PTCH
10.0000 mg | MEDICATED_PATCH | Freq: Every day | TRANSDERMAL | 0 refills | Status: DC | PRN
  Filled 2021-02-17 – 2021-02-18 (×3): qty 30, 30d supply, fill #0

## 2021-02-17 MED ORDER — TRAZODONE HCL 50 MG PO TABS
50.0000 mg | ORAL_TABLET | Freq: Every evening | ORAL | 1 refills | Status: DC | PRN
Start: 2021-02-17 — End: 2022-01-18
  Filled 2021-02-17: qty 180, 90d supply, fill #0
  Filled 2021-04-09: qty 180, 90d supply, fill #1

## 2021-02-17 MED ORDER — DAYTRANA 10 MG/9HR TD PTCH: 10.0000 mg | MEDICATED_PATCH | Freq: Every day | TRANSDERMAL | 0 refills | Status: DC

## 2021-02-17 MED ORDER — LEXAPRO 20 MG PO TABS
20.0000 mg | ORAL_TABLET | Freq: Every day | ORAL | 1 refills | Status: DC
  Filled 2021-02-17: qty 90, 90d supply, fill #0

## 2021-02-17 MED ORDER — DAYTRANA 10 MG/9HR TD PTCH: 10.0000 mg | MEDICATED_PATCH | Freq: Every day | TRANSDERMAL | 0 refills | Status: DC | PRN

## 2021-02-17 MED ORDER — METHYLPHENIDATE 15 MG/9HR TD PTCH: 15.0000 mg | MEDICATED_PATCH | Freq: Every day | TRANSDERMAL | 0 refills | Status: DC

## 2021-02-17 MED ORDER — METHYLPHENIDATE 15 MG/9HR TD PTCH
15.0000 mg | MEDICATED_PATCH | Freq: Every day | TRANSDERMAL | 0 refills | Status: DC
  Filled 2021-04-09: qty 30, 30d supply, fill #0

## 2021-02-17 MED ORDER — METHYLPHENIDATE 15 MG/9HR TD PTCH
15.0000 mg | MEDICATED_PATCH | Freq: Every day | TRANSDERMAL | 0 refills | Status: DC
  Filled 2021-02-17: qty 30, 30d supply, fill #0

## 2021-02-18 ENCOUNTER — Other Ambulatory Visit (HOSPITAL_COMMUNITY): Payer: Self-pay

## 2021-02-18 ENCOUNTER — Other Ambulatory Visit (INDEPENDENT_AMBULATORY_CARE_PROVIDER_SITE_OTHER): Payer: 59

## 2021-02-18 ENCOUNTER — Other Ambulatory Visit: Payer: Self-pay

## 2021-02-18 DIAGNOSIS — E063 Autoimmune thyroiditis: Secondary | ICD-10-CM | POA: Diagnosis not present

## 2021-02-18 DIAGNOSIS — E038 Other specified hypothyroidism: Secondary | ICD-10-CM

## 2021-02-18 LAB — T4, FREE: Free T4: 0.83 ng/dL (ref 0.60–1.60)

## 2021-02-18 LAB — TSH: TSH: 1.13 u[IU]/mL (ref 0.35–5.50)

## 2021-02-19 ENCOUNTER — Telehealth: Payer: Self-pay | Admitting: Internal Medicine

## 2021-02-19 ENCOUNTER — Other Ambulatory Visit (HOSPITAL_COMMUNITY): Payer: Self-pay

## 2021-02-19 NOTE — Telephone Encounter (Signed)
LVM to CB and schedule missed appt.

## 2021-02-23 DIAGNOSIS — M5416 Radiculopathy, lumbar region: Secondary | ICD-10-CM | POA: Diagnosis not present

## 2021-02-23 DIAGNOSIS — M25562 Pain in left knee: Secondary | ICD-10-CM | POA: Diagnosis not present

## 2021-02-23 DIAGNOSIS — M1712 Unilateral primary osteoarthritis, left knee: Secondary | ICD-10-CM | POA: Diagnosis not present

## 2021-02-23 DIAGNOSIS — M4722 Other spondylosis with radiculopathy, cervical region: Secondary | ICD-10-CM | POA: Diagnosis not present

## 2021-02-26 DIAGNOSIS — M1712 Unilateral primary osteoarthritis, left knee: Secondary | ICD-10-CM | POA: Diagnosis not present

## 2021-02-26 DIAGNOSIS — M5416 Radiculopathy, lumbar region: Secondary | ICD-10-CM | POA: Diagnosis not present

## 2021-02-26 DIAGNOSIS — M25562 Pain in left knee: Secondary | ICD-10-CM | POA: Diagnosis not present

## 2021-02-26 DIAGNOSIS — M4722 Other spondylosis with radiculopathy, cervical region: Secondary | ICD-10-CM | POA: Diagnosis not present

## 2021-03-09 DIAGNOSIS — M4722 Other spondylosis with radiculopathy, cervical region: Secondary | ICD-10-CM | POA: Diagnosis not present

## 2021-03-09 DIAGNOSIS — M25562 Pain in left knee: Secondary | ICD-10-CM | POA: Diagnosis not present

## 2021-03-09 DIAGNOSIS — M5416 Radiculopathy, lumbar region: Secondary | ICD-10-CM | POA: Diagnosis not present

## 2021-03-09 DIAGNOSIS — M1712 Unilateral primary osteoarthritis, left knee: Secondary | ICD-10-CM | POA: Diagnosis not present

## 2021-03-25 ENCOUNTER — Ambulatory Visit (INDEPENDENT_AMBULATORY_CARE_PROVIDER_SITE_OTHER): Payer: 59 | Admitting: Internal Medicine

## 2021-03-25 ENCOUNTER — Encounter: Payer: Self-pay | Admitting: Internal Medicine

## 2021-03-25 ENCOUNTER — Other Ambulatory Visit: Payer: Self-pay

## 2021-03-25 VITALS — BP 120/68 | HR 84 | Ht 58.75 in | Wt 182.0 lb

## 2021-03-25 DIAGNOSIS — E063 Autoimmune thyroiditis: Secondary | ICD-10-CM

## 2021-03-25 DIAGNOSIS — E782 Mixed hyperlipidemia: Secondary | ICD-10-CM | POA: Diagnosis not present

## 2021-03-25 DIAGNOSIS — R7303 Prediabetes: Secondary | ICD-10-CM | POA: Diagnosis not present

## 2021-03-25 DIAGNOSIS — E039 Hypothyroidism, unspecified: Secondary | ICD-10-CM | POA: Diagnosis not present

## 2021-03-25 DIAGNOSIS — E038 Other specified hypothyroidism: Secondary | ICD-10-CM

## 2021-03-25 NOTE — Patient Instructions (Signed)
Please continue Synthroid 50 mcg daily.  Take the thyroid hormone every day, with water, in am, at least 30 minutes before breakfast, separated by at least 4 hours from: - acid reflux medications - calcium - iron - multivitamins  Please come back for a follow-up appointment in 4-6 months.

## 2021-03-25 NOTE — Progress Notes (Signed)
Patient ID: Rita Gregory, female   DOB: 04-25-1945, 75 y.o.   MRN: [id number]  This visit occurred during the SARS-CoV-2 public health emergency.  Safety protocols were in place, including screening questions prior to the visit, additional usage of staff PPE, and extensive cleaning of exam room while observing appropriate contact time as indicated for disinfecting solutions.   HPI: Rita Gregory is a 75 y.o. female, self-referred for management of prediabetes, since early 2000s, and suspected hypoglycemic spells, Hashimoto's hypothyroidism.  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 her husband, Dr. Tillman Sers, is also  my patient. She previously saw Dr. Elyse Hsu, but he recently retired.  Interim history: When I last saw her, she just recovered after having had COVID in 10/2020 and was bedbound for a week.  She recently had another URI, now with some shortness of breath. + increased urination (has stress incontince - stable), + blurry vision, + nausea, no chest pain. She has HAs. She has a bulging disk C4-C5. She had PT. Still has dizziness. She plans to join [employer].  Prediabetes:  Reviewed history: Patient describes that she has longstanding sensitivity to blood sugar variations even within the normal range.    She describes that after she had attached a freestyle libre CGM, she realized that her blood sugars did not actually drop as low as she thought when she was feeling hypoglycemic, however, she does describe significant hypoglycemic symptoms including slurring of words and disorientation when sugars go to 68-70.  When sugars get to around 80-90, she describes heartburn, vision changes, headaches, extreme fatigue, dizziness.  This happens mostly during the day, after meals.  She corrects them with 2 glucose tablets.  She also feels the needs to eat ice cream at night to raise her glucose around 115-120 when going to bed, to avoid lows overnight.  Before our  visit in 12/2020 had to change her sensor targets from 90 to 85 and then to 80 as it was alarming her too much as she could not sleep.  Lowest blood sugars: 68 >> 65. Highest blood sugar: 130 >> 204 (Thanksgiving).  Reviewed patient's HbA1c levels: Lab Results  Component Value Date   HGBA1C 5.8 (A) 01/07/2021   HGBA1C 6.2 (H) 11/03/2020   HGBA1C 5.8 (H) 11/22/2016   Patient describes that she was started on 500 mg of metformin ER by Dr. Elyse Hsu in 08/2020, but she recently stopped due to concern for hypoglycemia. Pt is currently not on any medications for her prediabetes.  Reviewed her CGM downloads:   Previously:   Pt's meals are: - Brunch: Half a cup of great grain cereal with 10 pecans and 10 for almonds, + half-and-half - Dinner: 4 to 6 ounces of meat or fish -usually present - Snacks: 1-2 -including ice cream at night  - no CKD; last BUN/creatinine:  11/03/2020: ACR 15 Lab Results  Component Value Date   BUN 18 11/03/2020   BUN 21 11/22/2016   CREATININE 0.63 11/03/2020   CREATININE 0.69 11/22/2016  She is not on ARB or ACE inhibitor.  -+ HL; last set of lipids: Lab Results  Component Value Date   CHOL 172 11/03/2020   HDL 67 11/03/2020   LDLCALC 75 11/03/2020   TRIG 201 (H) 11/03/2020   CHOLHDL 2.6 11/03/2020  On pravastatin 40 mg daily and Vascepa 2 g twice a day -started before our visit from 12/2020.   - last eye exam was in 2022. No DR. + dry macular degeneration, dry eyes.    -  no numbness and tingling in her feet.   Pt has FH of DM in father - DM2.   She also has hypothyroidism:   Pt is on Synthroid 50 daily, taken: (Generic did not work for her in the past) - at night - fasting - at least 30 min from b'fast - no calcium - no iron - no multivitamins - + PPIs at night - not on Biotin  TFTs reviewed: Lab Results  Component Value Date   TSH 1.13 02/18/2021   TSH 4.00 11/03/2020   TSH 2.17 11/22/2016   She also has a history of a tiny  thyroid nodule: 08/30/2007: Thyroid ultrasound: Findings: Thyroid gland is lower limits of normal in size with the  right lobe measuring 3.3 cm long X 1.2 cm AP X 1.1 cm wide.  Left  lobe measures 3.9 cm long X 1.0 cm AP X 1.3 cm wide.  Isthmus  measures 2 mm AP.  Thyroid echotexture is homogeneous.  At the  medial mid left lobe the is cystic focus measuring 5 mm long X 3 mm  AP X 3 mm wide containing tiny anterior nonshadowing mural  echogenicity.     IMPRESSION:  1. Solitary 5 mm slightly complex cyst at the medial mid left lobe  thyroid favoring cystic adenoma.  2.  Otherwise, negative.    Pt denies: - feeling nodules in neck - hoarseness - dysphagia - choking - SOB with lying down  She has vitamin D deficiency-managed by PCP: Latest vitamin D was higher than target: Lab Results  Component Value Date   VD25OH 101 (H) 11/03/2020   VD25OH 69 11/22/2016  She was previously on ergocalciferol 50,000 units weekly per PCP but switched to every 2 weeks in 10/2020.  A B12 level was normal: Lab Results  Component Value Date   VITAMINB12 911 11/03/2020   She also has a history of: ADHD-on Adderall Chronic pain syndrome Chronic fatigue Fibromyalgia-sees rheumatology -on methylphenidate patch for this and for ADD GERD HTN Back pain and shoulder pain-status post spinal surgery - Dr. Ellene Route Osteoarthritis Migraines Dry eyes-on Shirley Friar and Systain - possibly Sjogren  ROS: + see HPI  Past Medical History:  Diagnosis Date   Allergy    Arthritis of left acromioclavicular joint    Asthma    Blood transfusion without reported diagnosis    unsure when   Cervical disc disorder with left arm radiculopathy of cervical region 04/20/2017   frequent headaches   Chronic pain    Dr. Ellene Route follows   Colon polyp 11/09/2007   Hyperplastic    Depression    Diverticulosis of colon (without mention of hemorrhage) 11/09/2007   Dry eye    Esophageal reflux    Fibromyalgia     Hyperlipidemia    Hypertension    in the past   Hypothyroidism    Kyphoscoliosis    40 degree bend ro right side   Paroxysmal spells 02/13/2013   Patent foramen ovale    hx.- Dr. Lemmie Evens. Smith,cardiology- follows   Pre-diabetes    Controls with diet   Rotator cuff tear, non-traumatic, left    Shoulder impingement, left    Past Surgical History:  Procedure Laterality Date   ABDOMINAL HYSTERECTOMY     ANTERIOR LATERAL LUMBAR FUSION 4 LEVELS Left 06/16/2015   Procedure: ANTERIOR LATERAL LUMBAR FUSION THORACIC TWELVE-LUMBAR FOUR;  Surgeon: Kristeen Miss, MD;  Location: [facility];  Service: Neurosurgery;  Laterality: Left;  Thoracolumbar spine   APPENDECTOMY  CESAREAN SECTION     x2   CHOLECYSTECTOMY     COLONOSCOPY  03/23/2020   dry eyes     uses Restasis daily   ESOPHAGOGASTRODUODENOSCOPY  02/23/2012   Procedure: ESOPHAGOGASTRODUODENOSCOPY (EGD);  Surgeon: Irene Shipper, MD;  Location: [facility];  Service: Endoscopy;  Laterality: N/A;   JOINT REPLACEMENT     RTKA '05   KNEE ARTHROSCOPY W/ ACL RECONSTRUCTION     Rt. Knee   POSTERIOR LUMBAR FUSION 4 LEVEL N/A 06/16/2015   Procedure: POSTERIOR LUMBAR FUSION LUMBAR FOUR-FIVE LUMBAR FIVE-SACRAL ONE;  Surgeon: Kristeen Miss, MD;  Location: [facility] [place] [facility];  Service: Neurosurgery;  Laterality: N/A;   RADIOLOGY WITH ANESTHESIA N/A 06/15/2015   Procedure: RADIOLOGY WITH ANESTHESIA;  Surgeon: Medication Radiologist, MD;  Location: [facility];  Service: Radiology;  Laterality: N/A;   right rotator cuff  2011   sx   SHOULDER ARTHROSCOPY Left 04/28/2017   Procedure: ARTHROSCOPY SHOULDER WITH EXTENSIVE DEBRIDEMENT ROTATOR CUFF TEAR;  Surgeon: Elsie Saas, MD;  Location: [facility];  Service: Orthopedics;  Laterality: Left;   UPPER GASTROINTESTINAL ENDOSCOPY  03/23/2020   Social History   Socioeconomic History   Marital status: Married    Spouse name: Legrand Como   Number of children: Not on file   Years of education: Not on file    Highest education level: Not on file  Occupational History   Occupation: Facilities manager: [employer]  Tobacco Use   Smoking status: Never    Passive exposure: Yes   Smokeless tobacco: Never   Tobacco comments:    Flight attentent for 7 years. 1966-1973 Passive smoker  Vaping Use   Vaping Use: Never used  Substance and Sexual Activity   Alcohol use: Yes    Comment: Very rarely    Drug use: No   Sexual activity: Yes    Partners: Male  Other Topics Concern   Not on file  Social History Narrative   Husband is Dr. Tillman Sers    Social Determinants of Health   Financial Resource Strain: Not on file  Food Insecurity: Not on file  Transportation Needs: Not on file  Physical Activity: Not on file  Stress: Not on file  Social Connections: Not on file  Intimate Partner Violence: Not on file   Current Outpatient Medications on File Prior to Visit  Medication Sig Dispense Refill   acetaminophen (TYLENOL) 650 MG CR tablet Take 650 mg by mouth every 8 (eight) hours as needed for pain.     albuterol (VENTOLIN HFA) 108 (90 Base) MCG/ACT inhaler Inhale 2 puffs into the lungs every 4 (four) hours as needed for wheezing 18 g 5   amoxicillin (AMOXIL) 500 MG capsule Take 4 capsules (2,000 mg total) by mouth as directed 1 hours before dental treatment 4 capsule 3   conjugated estrogens (PREMARIN) vaginal cream Place 0.625 mg vaginally daily as needed.      Continuous Blood Gluc Sensor (FREESTYLE LIBRE 2 SENSOR) MISC Inject into skin every 14 days as directed for glucose monitoring 4 each 12   Continuous Blood Gluc Sensor (FREESTYLE LIBRE 2 SENSOR) MISC Inject into the skin every 14 days. Use as directed for 14 days per sensor for glucose monitoring 12 each 4   COVID-19 At Home Antigen Test (CARESTART COVID-19 HOME TEST) KIT Use as directed. 4 each 0   cyclobenzaprine (FLEXERIL) 5 MG tablet Take 1 tablet (5 mg total) by mouth 3 (three) times daily. 30 tablet 2  DAYTRANA 10 MG/9HR patch  Place 1 patch (10 mg total) onto the skin daily in the afternoon. DNF until 03/15/2021 30 patch 0   [START ON 04/11/2021] DAYTRANA 10 MG/9HR patch Place 1 patch (10 mg total) onto the skin in the afternoon as needed. DNF 04/11/2021 30 patch 0   DAYTRANA 10 MG/9HR patch Place 1 patch (10 mg total) onto the skin every afternoon as needed. 30 patch 0   diazepam (VALIUM) 10 MG tablet Take 1 tablet  by mouth 45 minutes before MRI 1 tablet 0   EPINEPHrine 0.3 mg/0.3 mL IJ SOAJ injection Use as directed for severe allergic reaction 2 each 6   esomeprazole (NEXIUM) 20 MG capsule Take 20-40 mg by mouth at bedtime as needed.     estrogens, conjugated, (PREMARIN) 0.45 MG tablet TAKE 1 TABLET BY MOUTH ONCE DAILY. 90 tablet 3   glucose blood test strip Use to test blood sugar 2 times daily 100 each 8   hydrocortisone 2.5 % cream as needed.     icosapent Ethyl (VASCEPA) 1 g capsule Take 2 capsules (2 g total) by mouth 2 (two) times daily with a meal. 360 capsule 4   ketoconazole (NIZORAL) 2 % cream Apply 1 application topically 2 (two) times daily as needed for irritation.      L-Methylfolate-Algae (DEPLIN 15) 15-90.314 MG CAPS Take 1 capsule by mouth daily 90 capsule 1   Lancets (FREESTYLE) lancets Use to test blood sugar twice a day 200 each 4   Lancets 30G MISC USE TO TEST BLOOD SUGAR TWICE A DAY (E11.0) 200 each 4   LEXAPRO 20 MG tablet Take 1 tablet (20 mg total) by mouth daily. BRAND NAME ONLY 90 tablet 1   LEXAPRO 20 MG tablet Take 1 tablet (20 mg total) by mouth daily. 90 tablet 1   LEXAPRO 20 MG tablet Take 1 tablet (20 mg total) by mouth daily. 90 tablet 1   loratadine (CLARITIN) 10 MG tablet Take 10 mg by mouth daily as needed for allergies.     meclizine (ANTIVERT) 25 MG tablet Take 25 mg by mouth 3 (three) times daily as needed for dizziness.     methylphenidate (DAYTRANA) 10 mg/9hr Place 1 patch onto the skin daily as needed (28m daily/10 mg as a prn). Take if extra if needed for ADHD. Wear patch for  9 hours only each day     methylphenidate (DAYTRANA) 15 mg/9hr APPLY 1 PATCH TOPICALLY ON THE SKIN ONCE DAILY. DUE 08/30/20. 30 patch 0   methylphenidate (DAYTRANA) 15 mg/9hr Place 1 patch (15 mg total) onto the skin as directed on days not using 29mdose 30 patch 0   methylphenidate (DAYTRANA) 15 mg/9hr Place 1 patch (15 mg total) onto the skin as directed on days not taking 2066m0 patch 0   [START ON 04/11/2021] methylphenidate (DAYTRANA) 15 mg/9hr Place 1 patch (15 mg total) onto the skin daily. DNF until 04/11/2021 30 patch 0   methylphenidate (DAYTRANA) 15 mg/9hr Place 1 patch (15 mg total) onto the skin daily. DNF until 03/15/2021 30 patch 0   methylphenidate (DAYTRANA) 15 mg/9hr Place 1 patch (15 mg total) onto the skin daily. 30 patch 0   methylphenidate (DAYTRANA) 20 MG/9HR apply 1 patch daily fill 01/24/21 30 patch 0   miconazole (MONISTAT 7) 2 % vaginal cream Use as needed     Multiple Vitamins-Minerals (CENTRUM SILVER PO) Take 1 tablet by mouth. CENTRUM SILVER GUMMY ONE A DAY     ondansetron (  ZOFRAN ODT) 4 MG disintegrating tablet 70m ODT q4 hours prn nausea/vomit 20 tablet 0   oxymetazoline (AFRIN) 0.05 % nasal spray Use 1-2 sprays each nostril every 12 hours as needed, for occasional use only     pantoprazole (PROTONIX) 40 MG tablet Take 1 tablet (40 mg total) by mouth daily. 90 tablet 3   Polyethyl Glycol-Propyl Glycol (SYSTANE) 0.4-0.3 % GEL ophthalmic gel Apply to eye as needed.     Polyvinyl Alcohol-Povidone (REFRESH OP) Apply to eye.     pravastatin (PRAVACHOL) 40 MG tablet TAKE 1 TABLET BY MOUTH ONCE DAILY FOR CHOLESTEROL. 90 tablet 4   pravastatin (PRAVACHOL) 40 MG tablet Take 1 tablet (40 mg total) by mouth daily for cholesterol 90 tablet 4   PREMARIN 0.45 MG tablet Take 0.45 mg by mouth daily.     silver sulfADIAZINE (SILVADENE) 1 % cream APPLY TO THE AFFECTED AREA(S) AS NEEDED FOR BURN AS DIRECTED 25 g 2   SYNTHROID 50 MCG tablet Take 1 tablet (50 mcg total) by mouth daily  before breakfast. 90 tablet 3   testosterone (ANDROGEL) 50 MG/5GM (1%) GEL Use as directed 3 times a week as needed     traZODone (DESYREL) 50 MG tablet TAKE 1-2 TABLETS BY MOUTH AT BEDTIME AS NEEDED FOR SLEEP 180 tablet 1   traZODone (DESYREL) 50 MG tablet Take 1-2 tablets (50-100 mg total) by mouth at bedtime as needed for sleep 180 tablet 1   traZODone (DESYREL) 50 MG tablet Take 1-2 tablets (50-100 mg total) by mouth at bedtime as needed for sleep 180 tablet 1   triamcinolone (KENALOG) 0.1 % Apply topically in the morning and at bedtime.     Vitamin D, Ergocalciferol, (DRISDOL) 50000 units CAPS capsule Take 50,000 Units by mouth every 14 (fourteen) days.     No current facility-administered medications on file prior to visit.   Allergies  Allergen Reactions   Betadine [Povidone Iodine] Other (See Comments)    Blister hands and feet     Fentanyl Shortness Of Breath    Pt has tolerated this medication 06/2015 pre and during operation. MD ok giving.  Low dose only   Morphine And Related Anaphylaxis    respiratory depression    Bupropion Hcl     Dizzy, fog   Codeine Nausea And Vomiting   Demerol [Meperidine] Nausea And Vomiting   Dextromethorphan Other (See Comments)    Unknown    Guaifenesin & Derivatives     Other, unknown   Lipitor [Atorvastatin]     Myalgia   Lovaza [Omega-3-Acid Ethyl Esters] Diarrhea   Lyrica [Pregabalin]     Fatigue and depression   Naltrexone     depression   Percodan [Oxycodone-Aspirin] Nausea And Vomiting   Prilosec [Omeprazole]     Iliacs     Topamax [Topiramate]     disorientation    Valium [Diazepam]     Small doses are OK but large doses are not. Long-term use causes depression.    Versed [Midazolam]     In moderate in large doses shortness of breath, and respiratory depression    Adhesive [Tape] Rash    Skin irritation    Concerta [Methylphenidate] Palpitations   Doxycycline Rash    Pruritic rash   Lamotrigine Rash   Nickel Rash     Skin irritation    Oxycodone-Acetaminophen Nausea And Vomiting and Rash    depression   Percocet [Oxycodone-Acetaminophen] Nausea And Vomiting and Rash   Family History  Problem Relation Age  of Onset   Ovarian cancer Mother        passed at age 70   Heart disease Father    Kidney disease Father        Kidney cancer   Diabetes type II Father        passed at 41 yrs old   Colon polyps Father    Myasthenia gravis Father        passed from [place] gravis   Bipolar disorder Sister        Osteopenia   Bipolar disorder Son    Esophageal cancer Neg Hx    Rectal cancer Neg Hx    Stomach cancer Neg Hx    Colon cancer Neg Hx    PE: Vitals:   03/25/21 1449  BP: 120/68  Pulse: 84  SpO2: 96%   Wt Readings from Last 3 Encounters:  03/25/21 182 lb (82.6 kg)  01/07/21 183 lb (83 kg)  11/05/20 180 lb (81.6 kg)    Constitutional: overweight, in NAD Eyes: PERRLA, EOMI, no exophthalmos ENT: moist mucous membranes, no thyromegaly, no cervical lymphadenopathy Cardiovascular: RRR, No MRG, + mild periankle edema bilaterally Respiratory: CTA B Musculoskeletal: no deformities, strength intact in all 4 Skin: moist, warm, no rashes Neurological: no tremor with outstretched hands, DTR normal in all 4  ASSESSMENT: 1.  Prediabetes   2.  Idiopathic postprandial syndrome   3.  Hyperlipidemia  4.  Hashimoto's hypothyroidism   PLAN:  1. Patient with history of impaired glucose tolerance since 2002, with HbA1c levels in the prediabetic range.  Her highest HbA1c before this year was in 2008, at 6.1%.  However, in 04/2020, she had a higher HbA1c, at 6.2%, and a repeat obtained in 10/2020 was the same.  She was suggested to start metformin low-dose, 500 mg daily, by her previous endocrinologist but discussed due to presumed hypoglycemic episodes. Of note, she started a CGM in 2021 and this was an eye opener and demonstrated that her episodes of transient hypoglycemia were not, in fact, related to  low blood sugars.  At last visit with Dr. Elyse Hsu in 08/2020, she was advised to resume the CGM.  She is paying out-of-pocket for this. CGM interpretation: -At today's visit, we reviewed her CGM downloads: It appears that 100% of values are in target range (goal >70%), while 0% are higher than 180 (goal <25%), and 0% are lower than 70 (goal <4%).  The calculated average blood sugar is 119.  The projected HbA1c for the next 3 months (GMI) is 6.2%. -Reviewing the CGM trends, it appears that sugars are all at goal, with minimal fluctuations further reports, she had only 1 blood sugar in the low 200s around Thanksgiving.  Lowest blood sugar was 65. -No medication is needed for now - I suggested to:  Patient Instructions  Please continue Synthroid 50 mcg daily.  Take the thyroid hormone every day, with water, in am, at least 30 minutes before breakfast, separated by at least 4 hours from: - acid reflux medications - calcium - iron - multivitamins  Please come back for a follow-up appointment in 3-4 months.  - Discussed about targets for blood sugars: for her: 80-100 mg/dL before meals and less than 140-150 mg/dL after meals along with an HbA1c of less than 6.5% ideally - we did not repeat an HbA1c today - advised for yearly eye exams >> she is UTD - return to clinic in 6 months   2.  Idiopathic postprandial syndrome -Associated with spells  with weakness, dizziness, burning sensation in her stomach, which resolved with taking glucose  - However, these episodes were not associated with low blood sugars.  She occasionally has dizzy spells occurring when her blood sugars are at 90, and she eats to prevent this to happen.  She may have neurologic symptoms when her sugars drop under 70.  We discussed that since these are normal sugars, it appears that she may have just an increase sensitivity to the fluctuations in the blood sugars.  Of note, she saw neurology in the past and no etiology was found for  these episodes. MRI was normal.   -No medication is available for this condition, however, at last visit we discussed about dietary changes that she can institute to prevent large fluctuations in blood sugars: Eating low glycemic index foods, mostly focusing on solid foods, rather than semisolid or liquid, no  liquids at the time of the meal but separate them by at least 30 minutes from the meal, and start the meal with protein and fat and end with carbs.  -She does have glucose tablets, of which she takes 1-2 if she feels her sugars are dropping. -We did discuss at last visit with her freestyle libre CGM indicates sugars that are 10 to 20 mg/dL lower than actually measured by glucometer, therefore, no lows observed on the CGM may be actually normal blood sugars when checked with her glucometer. -Of note, she has an apple watch with a med-alert feature   3.  Hyperlipidemia -Reviewed latest lipid panel from 10/2020: Triglycerides high, LDL and HDL at goal: Lab Results  Component Value Date   CHOL 172 11/03/2020   HDL 67 11/03/2020   LDLCALC 75 11/03/2020   TRIG 201 (H) 11/03/2020   CHOLHDL 2.6 11/03/2020  -She continues on pravastatin 40 mg daily and Vascepa 2 g twice a day, started after the above results returned.  These are tolerated well  4.  Hashimoto's hypothyroidism - latest thyroid labs reviewed with pt. >> normal: Lab Results  Component Value Date   TSH 1.13 02/18/2021  - she continues on Synthroid d.a.w. 50 mcg daily at last visit, we changed her from 2 tablets of 25 mcg daily to 1 tablet of 50 mcg daily - pt feels good on this dose. - we discussed about taking the thyroid hormone every day, with water, >30 minutes before breakfast, separated by >4 hours from acid reflux medications, calcium, iron, multivitamins. Pt. is taking it correctly now.  At last visit, we moved the dose from nighttime to morning.  Philemon Kingdom, MD PhD [facility] Endocrinology

## 2021-04-09 ENCOUNTER — Other Ambulatory Visit: Payer: Self-pay | Admitting: Ophthalmology

## 2021-04-09 ENCOUNTER — Other Ambulatory Visit (HOSPITAL_COMMUNITY): Payer: Self-pay

## 2021-04-13 ENCOUNTER — Other Ambulatory Visit (HOSPITAL_COMMUNITY): Payer: Self-pay

## 2021-04-15 DIAGNOSIS — L820 Inflamed seborrheic keratosis: Secondary | ICD-10-CM | POA: Diagnosis not present

## 2021-04-15 DIAGNOSIS — L218 Other seborrheic dermatitis: Secondary | ICD-10-CM | POA: Diagnosis not present

## 2021-04-15 DIAGNOSIS — D225 Melanocytic nevi of trunk: Secondary | ICD-10-CM | POA: Diagnosis not present

## 2021-04-15 DIAGNOSIS — L821 Other seborrheic keratosis: Secondary | ICD-10-CM | POA: Diagnosis not present

## 2021-04-15 DIAGNOSIS — L304 Erythema intertrigo: Secondary | ICD-10-CM | POA: Diagnosis not present

## 2021-04-16 ENCOUNTER — Other Ambulatory Visit (HOSPITAL_COMMUNITY): Payer: Self-pay

## 2021-04-16 MED ORDER — XIIDRA 5 % OP SOLN
1.0000 [drp] | Freq: Two times a day (BID) | OPHTHALMIC | 4 refills | Status: AC
  Filled 2021-04-16: qty 180, 90d supply, fill #0
  Filled 2021-10-07: qty 180, 90d supply, fill #1
  Filled 2022-01-04: qty 180, 90d supply, fill #2

## 2021-04-30 ENCOUNTER — Other Ambulatory Visit (HOSPITAL_COMMUNITY): Payer: Self-pay

## 2021-04-30 MED ORDER — CARESTART COVID-19 HOME TEST VI KIT
PACK | 0 refills | Status: DC
  Filled 2021-04-30: qty 4, 4d supply, fill #0

## 2021-05-07 ENCOUNTER — Other Ambulatory Visit (HOSPITAL_COMMUNITY): Payer: Self-pay

## 2021-05-07 DIAGNOSIS — G894 Chronic pain syndrome: Secondary | ICD-10-CM | POA: Diagnosis not present

## 2021-05-07 DIAGNOSIS — F900 Attention-deficit hyperactivity disorder, predominantly inattentive type: Secondary | ICD-10-CM | POA: Diagnosis not present

## 2021-05-07 MED ORDER — METHYLPHENIDATE 15 MG/9HR TD PTCH
15.0000 mg | MEDICATED_PATCH | Freq: Every day | TRANSDERMAL | 0 refills | Status: DC
Start: 2021-06-03 — End: 2022-01-18
  Filled 2021-06-24: qty 30, 30d supply, fill #0

## 2021-05-07 MED ORDER — METHYLPHENIDATE 10 MG/9HR TD PTCH: 10.0000 mg | MEDICATED_PATCH | TRANSDERMAL | 0 refills | Status: DC

## 2021-05-07 MED ORDER — METHYLPHENIDATE 10 MG/9HR TD PTCH
10.0000 mg | MEDICATED_PATCH | TRANSDERMAL | 0 refills | Status: DC
  Filled 2021-05-07: qty 30, 30d supply, fill #0

## 2021-05-07 MED ORDER — TRAZODONE HCL 50 MG PO TABS
50.0000 mg | ORAL_TABLET | Freq: Every evening | ORAL | 1 refills | Status: DC | PRN
  Filled 2021-05-07: qty 180, 90d supply, fill #0

## 2021-05-07 MED ORDER — METHYLPHENIDATE 15 MG/9HR TD PTCH
15.0000 mg | MEDICATED_PATCH | Freq: Every day | TRANSDERMAL | 0 refills | Status: DC
Start: 2021-06-30 — End: 2021-08-05

## 2021-05-07 MED ORDER — LEXAPRO 20 MG PO TABS
20.0000 mg | ORAL_TABLET | Freq: Every day | ORAL | 1 refills | Status: DC
  Filled 2021-05-07 – 2021-05-28 (×2): qty 90, 90d supply, fill #0

## 2021-05-07 MED ORDER — METHYLPHENIDATE 15 MG/9HR TD PTCH
15.0000 mg | MEDICATED_PATCH | Freq: Every day | TRANSDERMAL | 0 refills | Status: DC
  Filled 2021-05-07: qty 30, 30d supply, fill #0

## 2021-05-10 ENCOUNTER — Other Ambulatory Visit (HOSPITAL_COMMUNITY): Payer: Self-pay

## 2021-05-10 ENCOUNTER — Encounter: Payer: Self-pay | Admitting: Internal Medicine

## 2021-05-10 DIAGNOSIS — R7303 Prediabetes: Secondary | ICD-10-CM

## 2021-05-10 MED ORDER — FREESTYLE LIBRE 2 READER DEVI
0 refills | Status: DC
  Filled 2021-05-10: qty 1, 14d supply, fill #0

## 2021-05-20 ENCOUNTER — Other Ambulatory Visit (HOSPITAL_COMMUNITY): Payer: Self-pay

## 2021-05-20 ENCOUNTER — Telehealth: Payer: Self-pay | Admitting: Internal Medicine

## 2021-05-20 MED ORDER — CARESTART COVID-19 HOME TEST VI KIT
PACK | 0 refills | Status: DC
  Filled 2021-05-20: qty 4, 4d supply, fill #0

## 2021-05-20 MED FILL — Estrogens, Conjugated Tab 0.45 MG: ORAL | 90 days supply | Qty: 90 | Fill #3 | Status: AC

## 2021-05-20 NOTE — Telephone Encounter (Signed)
Rita Gregory [phone]  [name] called to say she was having UTI urgency, frequency ,HA,fatigue,hernia,epigastric X 1 year, pain on center low ab, mild C4/C5 wants referral to Dr Jaynee Eagles. I let her know we could not address all of these issues in one visit. I scheduled her for a possible UTI visit at 65 tomorrow because she stated she is not able to come today.

## 2021-05-21 ENCOUNTER — Other Ambulatory Visit (HOSPITAL_COMMUNITY): Payer: Self-pay

## 2021-05-21 ENCOUNTER — Other Ambulatory Visit: Payer: Self-pay

## 2021-05-21 ENCOUNTER — Ambulatory Visit (INDEPENDENT_AMBULATORY_CARE_PROVIDER_SITE_OTHER): Payer: 59 | Admitting: Internal Medicine

## 2021-05-21 ENCOUNTER — Encounter: Payer: Self-pay | Admitting: Internal Medicine

## 2021-05-21 VITALS — BP 84/68 | HR 80 | Temp 97.3°F | Wt 174.4 lb

## 2021-05-21 DIAGNOSIS — M6281 Muscle weakness (generalized): Secondary | ICD-10-CM | POA: Diagnosis not present

## 2021-05-21 DIAGNOSIS — R350 Frequency of micturition: Secondary | ICD-10-CM

## 2021-05-21 DIAGNOSIS — M791 Myalgia, unspecified site: Secondary | ICD-10-CM | POA: Diagnosis not present

## 2021-05-21 DIAGNOSIS — R5383 Other fatigue: Secondary | ICD-10-CM

## 2021-05-21 LAB — POCT URINALYSIS DIPSTICK
Bilirubin, UA: NEGATIVE
Blood, UA: NEGATIVE
Glucose, UA: NEGATIVE
Ketones, UA: NEGATIVE
Leukocytes, UA: NEGATIVE
Nitrite, UA: NEGATIVE
Protein, UA: POSITIVE — AB
Spec Grav, UA: 1.02 (ref 1.010–1.025)
Urobilinogen, UA: 0.2 E.U./dL
pH, UA: 6 (ref 5.0–8.0)

## 2021-05-21 MED ORDER — CEFTRIAXONE SODIUM 1 G IJ SOLR
1.0000 g | Freq: Once | INTRAMUSCULAR | Status: AC
  Administered 2021-05-21: 1 g via INTRAMUSCULAR

## 2021-05-21 MED ORDER — CIPROFLOXACIN HCL 250 MG PO TABS: 250.0000 mg | ORAL_TABLET | Freq: Two times a day (BID) | ORAL | 0 refills | Status: AC

## 2021-05-21 NOTE — Progress Notes (Signed)
° °  Subjective:    Patient ID: Rita Gregory, female    DOB: 1945/12/03, 76 y.o.   MRN: [id number]  HPI 76 year old Female seen urgently today with complaint of urinary urgency and frequency. Also complaint of headache, fatigue, neck pain and epigastric pain. She recently has been having generalized musculoskeletal pain and spends up to 10 hours in bed. Complaint of epigastric pain x one year.  Patient had Medicare wellness visit and evaluation of medical issues for the first time here July 2022. She has a history of osteoarthritis, fatigue, fibromyalgia and migraine headaches, Hx of right total knee arthroplasty and rotator cuff surgery.  Had upper endoscopy Dec 2021 sessile serrated polyp and hyperplastic polyp removed.  Dr. Ellene Route is her neurosurgeon. Has hx of scoliosis and thoracis spondylosis with myelopathy.  She is on Daytrana patch for ADD. Hx GERD treated with PPI. Takes Trazadone for sleep. And Synthroid for hypothyroidism. She takes Lexapro daily. Uses Voltaren gel for osteoarthritis.  She has hypothyroidism, fibromyalgia, migraines, fatigue , hypertriglyceridemia, impaired glucose tolerance, sever musculoskeletal pain, nocturia. Scoliosis  and lumbar fusion resulting in chronic back pain. Hx right TKA,    Review of Systems no CVA tenderness. Has lower pelvic discomfort  Main issue today seems to be musculoskeletal pain. Urine dipstick positive for protein otherwise negative and culture grew mixed flora.     Objective:   Physical Exam  BP low at 84/68 Pulse ox 95% ear thermomter 97.3 degrees   She seems uncomfortable but NAD.No CVA tenderness.  Urine dipstick positive for protein but no evidence of acute UTI    Assessment & Plan:  Musculoskeletal pain- Urine culture obtained and Rheumatology studies obtained. CCP negative.sed rate 45. TSH normal.  Plan: treated with one gram IM Rocephin and Cipro 250 mg twice daily x 10 days.  Referral pending for Neurology  regarding Migraine Headaches  Addendum:ANA low titer 1:80, pattern nuclear speckled. CBC is normal. Glucose 126.   She is also being referred to Rheumatologist.

## 2021-05-22 LAB — URINE CULTURE
MICRO NUMBER:: 12993410
SPECIMEN QUALITY:: ADEQUATE

## 2021-05-22 LAB — CK: Total CK: 34 U/L (ref 29–143)

## 2021-05-24 NOTE — Telephone Encounter (Signed)
Results have been relayed to the patient. The patient verbalized understanding. No questions at this time.  She states that she has gone back to nexium from the prevacid and that has seemed to help her stomach more. No medication needed.

## 2021-05-24 NOTE — Telephone Encounter (Signed)
Rita Gregory said you ask her to call back today to let you know how she was doing. The pain in lower abdomen has subsided a lot. Much better. She still has a sore throat, has been nauseous, had drainage and diarrhea over the weekend. Did COVID test yesterday that was negative. No fever.

## 2021-05-25 LAB — CBC WITH DIFFERENTIAL/PLATELET
Absolute Monocytes: 717 cells/uL (ref 200–950)
Basophils Absolute: 51 cells/uL (ref 0–200)
Basophils Relative: 0.5 %
Eosinophils Absolute: 61 cells/uL (ref 15–500)
Eosinophils Relative: 0.6 %
HCT: 41.2 % (ref 35.0–45.0)
Hemoglobin: 13.6 g/dL (ref 11.7–15.5)
Lymphs Abs: 1939 cells/uL (ref 850–3900)
MCH: 28.7 pg (ref 27.0–33.0)
MCHC: 33 g/dL (ref 32.0–36.0)
MCV: 86.9 fL (ref 80.0–100.0)
MPV: 9.9 fL (ref 7.5–12.5)
Monocytes Relative: 7.1 %
Neutro Abs: 7333 cells/uL (ref 1500–7800)
Neutrophils Relative %: 72.6 %
Platelets: 296 10*3/uL (ref 140–400)
RBC: 4.74 10*6/uL (ref 3.80–5.10)
RDW: 13.4 % (ref 11.0–15.0)
Total Lymphocyte: 19.2 %
WBC: 10.1 10*3/uL (ref 3.8–10.8)

## 2021-05-25 LAB — COMPLETE METABOLIC PANEL WITH GFR
AG Ratio: 1.4 (calc) (ref 1.0–2.5)
ALT: 11 U/L (ref 6–29)
AST: 17 U/L (ref 10–35)
Albumin: 4 g/dL (ref 3.6–5.1)
Alkaline phosphatase (APISO): 104 U/L (ref 37–153)
BUN: 15 mg/dL (ref 7–25)
CO2: 26 mmol/L (ref 20–32)
Calcium: 9 mg/dL (ref 8.6–10.4)
Chloride: 99 mmol/L (ref 98–110)
Creat: 0.83 mg/dL (ref 0.60–1.00)
Globulin: 2.8 g/dL (calc) (ref 1.9–3.7)
Glucose, Bld: 126 mg/dL — ABNORMAL HIGH (ref 65–99)
Potassium: 3.9 mmol/L (ref 3.5–5.3)
Sodium: 137 mmol/L (ref 135–146)
Total Bilirubin: 0.6 mg/dL (ref 0.2–1.2)
Total Protein: 6.8 g/dL (ref 6.1–8.1)
eGFR: 73 mL/min/{1.73_m2} (ref >=60)

## 2021-05-25 LAB — ANTI-NUCLEAR AB-TITER (ANA TITER): ANA Titer 1: 1:80 {titer} — ABNORMAL HIGH

## 2021-05-25 LAB — CYCLIC CITRUL PEPTIDE ANTIBODY, IGG: Cyclic Citrullin Peptide Ab: <16 UNITS

## 2021-05-25 LAB — ANA: Anti Nuclear Antibody (ANA): POSITIVE — AB

## 2021-05-25 LAB — TSH: TSH: 0.63 mIU/L (ref 0.40–4.50)

## 2021-05-25 LAB — SEDIMENTATION RATE: Sed Rate: 45 mm/h — ABNORMAL HIGH (ref 0–30)

## 2021-05-27 ENCOUNTER — Telehealth: Payer: Self-pay | Admitting: Internal Medicine

## 2021-05-27 NOTE — Telephone Encounter (Signed)
Called patient to see be referred to for Rheumatology and she stated she will ask husband and call back with who she would like to be referred to.

## 2021-05-27 NOTE — Telephone Encounter (Signed)
-----   Message from Elby Showers, MD sent at 05/26/2021 12:29 PM EST ----- Please make referral to Rheumatologist of patient's choice. Has low titier positive ANA and musculoskeletal pain. ----- Message ----- From: Angus Seller, CMA Sent: 05/21/2021  12:33 PM EST To: Elby Showers, MD

## 2021-05-28 ENCOUNTER — Other Ambulatory Visit (HOSPITAL_COMMUNITY): Payer: Self-pay

## 2021-06-02 NOTE — Telephone Encounter (Signed)
LVM to see if she had decided what rheumatologist she wanted to be referred to.

## 2021-06-03 NOTE — Patient Instructions (Signed)
Awaiting Neurology referral and have made Rheumatology referral for low titter positive ANA and musculoskeletal pain. Treated for possible UTI with Rocephin and Cipro pending culture results.

## 2021-06-08 ENCOUNTER — Other Ambulatory Visit: Payer: Self-pay | Admitting: Internal Medicine

## 2021-06-08 ENCOUNTER — Other Ambulatory Visit (HOSPITAL_COMMUNITY): Payer: Self-pay

## 2021-06-08 ENCOUNTER — Other Ambulatory Visit: Payer: Self-pay

## 2021-06-08 DIAGNOSIS — Z1231 Encounter for screening mammogram for malignant neoplasm of breast: Secondary | ICD-10-CM

## 2021-06-08 NOTE — Telephone Encounter (Signed)
Patient called back and does not have preference, so have sent to [facility].

## 2021-06-09 ENCOUNTER — Other Ambulatory Visit (HOSPITAL_COMMUNITY): Payer: Self-pay

## 2021-06-14 ENCOUNTER — Other Ambulatory Visit: Payer: Self-pay

## 2021-06-14 ENCOUNTER — Other Ambulatory Visit (HOSPITAL_COMMUNITY): Payer: Self-pay

## 2021-06-14 ENCOUNTER — Ambulatory Visit
Admission: RE | Admit: 2021-06-14 | Discharge: 2021-06-14 | Disposition: A | Discharge status: Home / Self Care | Payer: 59 | Source: Ambulatory Visit | Attending: Internal Medicine | Admitting: Internal Medicine

## 2021-06-14 DIAGNOSIS — Z1231 Encounter for screening mammogram for malignant neoplasm of breast: Secondary | ICD-10-CM | POA: Diagnosis not present

## 2021-06-15 ENCOUNTER — Telehealth: Payer: Self-pay

## 2021-06-15 NOTE — Telephone Encounter (Addendum)
Patient states that Dr Tana Felts will not schedule her for H/A only. She is having visual disturbances, extreme fatigue and weakness in her arms and legs along with a mildly elevated ANA. They will not make appt until referral and notes reflect that.  ? ?Note per M.J. Renold Genta, MD- March 7,2023  ? ?I have personally spoken with Rita Gregory today by telephone. She reports extreme intermittent muscle weakness of arms and legs along with severe headaches that are debilitating. Has visual disturbances at times. Has seen Dr. Valetta Close at [facility]. She is quite worried.  She previously saw Dr. Brett Fairy but  says she did not feel her symptoms were being addressed. Father  apparently had Myasthenia gravis and patient is worried about this as well. ? ?Labs from  February 2023 ?ANA positive low titer 1:80  with nuclear speckled pattern. Sed rate 45. CCP negative. TSH normal.Total CK 34. ?

## 2021-06-15 NOTE — Telephone Encounter (Incomplete Revision)
Patient states that Dr Tana Felts will not schedule her for H/A only. She is having visual disturbances, extreme fatigue and weakness in her arms and legs along with a mildly elevated ANA. They will not make appt until referral and notes reflect that.   Note per M.J. Renold Genta, MD- March 7,2023   I have personally spoken with Mrs. Conerly today by telephone. She reports extreme intermittent muscle weakness of arms and legs along with severe headaches that are debilitating. Has visual disturbances at times. Has seen Dr. Valetta Close at [facility]. She is quite worried.  She previously saw Dr. Brett Fairy but  says she did not feel her symptoms were being addressed. Father  apparently had Myasthenia gravis and patient is worried about this as well.  Labs from  February 2023: ANA positive low titer 1:80  with nuclear speckled pattern. Sed rate 45. CCP negative. TSH normal.Total CK 34.

## 2021-06-16 ENCOUNTER — Encounter: Payer: Self-pay | Admitting: Neurology

## 2021-06-17 DIAGNOSIS — H04123 Dry eye syndrome of bilateral lacrimal glands: Secondary | ICD-10-CM | POA: Diagnosis not present

## 2021-06-17 DIAGNOSIS — R519 Headache, unspecified: Secondary | ICD-10-CM | POA: Diagnosis not present

## 2021-06-23 ENCOUNTER — Other Ambulatory Visit (INDEPENDENT_AMBULATORY_CARE_PROVIDER_SITE_OTHER): Payer: 59

## 2021-06-23 ENCOUNTER — Encounter: Payer: Self-pay | Admitting: Neurology

## 2021-06-23 ENCOUNTER — Other Ambulatory Visit: Payer: Self-pay

## 2021-06-23 ENCOUNTER — Ambulatory Visit (INDEPENDENT_AMBULATORY_CARE_PROVIDER_SITE_OTHER): Payer: 59 | Admitting: Neurology

## 2021-06-23 VITALS — BP 119/76 | HR 80 | Ht 58.75 in | Wt 182.0 lb

## 2021-06-23 DIAGNOSIS — R531 Weakness: Secondary | ICD-10-CM | POA: Diagnosis not present

## 2021-06-23 NOTE — Patient Instructions (Signed)
Check labs ? ?NCS/EMG of the right arm and leg - myasthenia gravis protocol ? ?ELECTROMYOGRAM AND NERVE CONDUCTION STUDIES (EMG/NCS) INSTRUCTIONS ? ?How to Prepare ?The neurologist conducting the EMG will need to know if you have certain medical conditions. Tell the neurologist and other EMG lab personnel if you: ?Have a pacemaker or any other electrical medical device ?Take blood-thinning medications ?Have hemophilia, a blood-clotting disorder that causes prolonged bleeding ?Bathing ?Take a shower or bath shortly before your exam in order to remove oils from your skin. Don?t apply lotions or creams before the exam.  ?What to Expect ?You?ll likely be asked to change into a hospital gown for the procedure and lie down on an examination table. The following explanations can help you understand what will happen during the exam.  ?Electrodes. The neurologist or a technician places surface electrodes at various locations on your skin depending on where you?re experiencing symptoms. Or the neurologist may insert needle electrodes at different sites depending on your symptoms.  ?Sensations. The electrodes will at times transmit a tiny electrical current that you may feel as a twinge or spasm. The needle electrode may cause discomfort or pain that usually ends shortly after the needle is removed. ?If you are concerned about discomfort or pain, you may want to talk to the neurologist about taking a short break during the exam.  ?Instructions. During the needle EMG, the neurologist will assess whether there is any spontaneous electrical activity when the muscle is at rest - activity that isn?t present in healthy muscle tissue - and the degree of activity when you slightly contract the muscle.  ?He or she will give you instructions on resting and contracting a muscle at appropriate times. Depending on what muscles and nerves the neurologist is examining, he or she may ask you to change positions during the exam.  ?After your  EMG ?You may experience some temporary, minor bruising where the needle electrode was inserted into your muscle. This bruising should fade within several days. If it persists, contact your primary care doctor.  ? ?

## 2021-06-23 NOTE — Progress Notes (Signed)
?[facility] ?Neurology Division ?Clinic Note - Initial Visit ? ? ?Date: 06/23/21 ? ?Rita Gregory ?MRN: [id number] ?DOB: Jan 12, 1946 ? ? ?Dear Dr. Renold Genta: ? ?Thank you for your kind referral of Rita Gregory for consultation of generalized weakness. Although her history is well known to you, please allow [place] to reiterate it for the purpose of our medical record. The patient was accompanied to the clinic by husband (physician) who also provides collateral information.  ?  ? ?History of Present Illness: ?Rita Gregory is a 76 y.o. right-handed female with ADHD, history of scoliosis and thoracic spondylosis with myelopathy, prediabetes, fibromylagia (1989), depression/anxiety, and bipolar tendancies presenting for evaluation of generalized weakness.  ? ?Patient has a multitude of symptoms for many years, including shortness of breath with exertion, fatigue, and generalized pain.  Her husband who is [facility] pediatric endocrinologist says that her symptoms have been progressively getting worse over the past year where she now spends most of her day in the bed and has become very sedentary.  Patient and husband would like to be evaluated for myasthenia gravis because her father developed myasthenia gravis in his 27s manifesting with fatigue and shortness of breath.  Of note, she has been evaluated by neurology in the past and had AChR antibodies checked in 2013 and 2020 which was negative. She reports having intermittent blurred vision and sometimes double vision which is worse in the evening; symptoms do not improve with one eye closure.  She has spells of slurred speech when her blood sugar is low.  She is able to perform her ADLs.  She admits to having generalized pain every day. Recent vitamin B12, TSH, and CK has been normal.  ? ?Out-side paper records, electronic medical record, and images have been reviewed where available and summarized as:  ?Lab Results  ?Component Value Date  ? HGBA1C 5.8 (A)  01/07/2021  ? ?Lab Results  ?Component Value Date  ? KZLDJTTS17 911 11/03/2020  ? ?Lab Results  ?Component Value Date  ? TSH 0.63 05/21/2021  ? ?Lab Results  ?Component Value Date  ? ESRSEDRATE 45 (H) 05/21/2021  ? ?Lab Results  ?Component Value Date  ? CKTOTAL 34 05/21/2021  ? ?Labs 03/06/2019:  AChR modulating - neg ?Labs 08/03/2011:  AChR binding, blocking and modulating - neg ? ?Past Medical History:  ?Diagnosis Date  ? Allergy   ? Arthritis of left acromioclavicular joint   ? Asthma   ? Blood transfusion without reported diagnosis   ? unsure when  ? Cervical disc disorder with left arm radiculopathy of cervical region 04/20/2017  ? frequent headaches  ? Chronic pain   ? Dr. Ellene Route follows  ? Colon polyp 11/09/2007  ? Hyperplastic   ? Depression   ? Diverticulosis of colon (without mention of hemorrhage) 11/09/2007  ? Dry eye   ? Esophageal reflux   ? Fibromyalgia   ? Hyperlipidemia   ? Hypertension   ? in the past  ? Hypothyroidism   ? Kyphoscoliosis   ? 40 degree bend ro right side  ? Paroxysmal spells 02/13/2013  ? Patent foramen ovale   ? hx.- Dr. Lemmie Evens. Smith,cardiology- follows  ? Pre-diabetes   ? Controls with diet  ? Rotator cuff tear, non-traumatic, left   ? Shoulder impingement, left   ? ? ?Past Surgical History:  ?Procedure Laterality Date  ? ABDOMINAL HYSTERECTOMY    ? ANTERIOR LATERAL LUMBAR FUSION 4 LEVELS Left 06/16/2015  ? Procedure: ANTERIOR LATERAL LUMBAR FUSION THORACIC TWELVE-LUMBAR FOUR;  Surgeon: Kristeen Miss, MD;  Location: [facility] NEURO ORS;  Service: Neurosurgery;  Laterality: Left;  Thoracolumbar spine  ? APPENDECTOMY    ? CESAREAN SECTION    ? x2  ? CHOLECYSTECTOMY    ? COLONOSCOPY  03/23/2020  ? dry eyes    ? uses Restasis daily  ? ESOPHAGOGASTRODUODENOSCOPY  02/23/2012  ? Procedure: ESOPHAGOGASTRODUODENOSCOPY (EGD);  Surgeon: Irene Shipper, MD;  Location: [facility];  Service: Endoscopy;  Laterality: N/A;  ? JOINT REPLACEMENT    ? RTKA '05  ? KNEE ARTHROSCOPY W/ ACL RECONSTRUCTION    ? Rt. Knee   ? POSTERIOR LUMBAR FUSION 4 LEVEL N/A 06/16/2015  ? Procedure: POSTERIOR LUMBAR FUSION LUMBAR FOUR-FIVE LUMBAR FIVE-SACRAL ONE;  Surgeon: Kristeen Miss, MD;  Location: [place] NEURO ORS;  Service: Neurosurgery;  Laterality: N/A;  ? RADIOLOGY WITH ANESTHESIA N/A 06/15/2015  ? Procedure: RADIOLOGY WITH ANESTHESIA;  Surgeon: Medication Radiologist, MD;  Location: [place];  Service: Radiology;  Laterality: N/A;  ? right rotator cuff  2011  ? sx  ? SHOULDER ARTHROSCOPY Left 04/28/2017  ? Procedure: ARTHROSCOPY SHOULDER WITH EXTENSIVE DEBRIDEMENT ROTATOR CUFF TEAR;  Surgeon: Elsie Saas, MD;  Location: [facility];  Service: Orthopedics;  Laterality: Left;  ? UPPER GASTROINTESTINAL ENDOSCOPY  03/23/2020  ? ? ? ?Medications:  ?Outpatient Encounter Medications as of 06/23/2021  ?Medication Sig  ? acetaminophen (TYLENOL) 650 MG CR tablet Take 650 mg by mouth every 8 (eight) hours as needed for pain.  ? albuterol (VENTOLIN HFA) 108 (90 Base) MCG/ACT inhaler Inhale 2 puffs into the lungs every 4 (four) hours as needed for wheezing  ? amoxicillin (AMOXIL) 500 MG capsule Take 4 capsules (2,000 mg total) by mouth as directed 1 hours before dental treatment  ? conjugated estrogens (PREMARIN) vaginal cream Place 0.625 mg vaginally daily as needed.   ? Continuous Blood Gluc Receiver (FREESTYLE LIBRE 2 READER) DEVI Use as instructed to check blood sugar  ? Continuous Blood Gluc Sensor (FREESTYLE LIBRE 2 SENSOR) MISC Inject into skin every 14 days as directed for glucose monitoring  ? Continuous Blood Gluc Sensor (FREESTYLE LIBRE 2 SENSOR) MISC Inject into the skin every 14 days. Use as directed for 14 days per sensor for glucose monitoring  ? COVID-19 At Home Antigen Test [facility] COVID-19 HOME TEST) KIT Use  as directed  ? cyclobenzaprine (FLEXERIL) 5 MG tablet Take 1 tablet (5 mg total) by mouth 3 (three) times daily.  ? diazepam (VALIUM) 10 MG tablet Take 1 tablet  by mouth 45 minutes before MRI  ? EPINEPHrine 0.3 mg/0.3 mL  IJ SOAJ injection Use as directed for severe allergic reaction  ? estrogens, conjugated, (PREMARIN) 0.45 MG tablet TAKE 1 TABLET BY MOUTH ONCE DAILY.  ? glucose blood test strip Use to test blood sugar 2 times daily  ? hydrocortisone 2.5 % cream as needed.  ? icosapent Ethyl (VASCEPA) 1 g capsule Take 2 capsules (2 g total) by mouth 2 (two) times daily with a meal.  ? ketoconazole (NIZORAL) 2 % cream Apply 1 application topically 2 (two) times daily as needed for irritation.   ? Lancets (FREESTYLE) lancets Use to test blood sugar twice a day  ? LEXAPRO 20 MG tablet Take 1 tablet (20 mg total) by mouth daily. BRAND NAME ONLY  ? LEXAPRO 20 MG tablet Take 1 tablet (20 mg total) by mouth daily.  ? LEXAPRO 20 MG tablet Take 1 tablet (20 mg total) by mouth daily.  ? LEXAPRO 20 MG tablet  Take 1 tablet (20 mg total) by mouth daily.  ? Lifitegrast (XIIDRA) 5 % SOLN Place 1 drop into both eyes 2 (two) times daily.  ? loratadine (CLARITIN) 10 MG tablet Take 10 mg by mouth daily as needed for allergies.  ? meclizine (ANTIVERT) 25 MG tablet Take 25 mg by mouth 3 (three) times daily as needed for dizziness.  ? methylphenidate (DAYTRANA) 10 mg/9hr patch Place 1 patch (10 mg total) onto the skin every afternoon  as needed  ? [START ON 06/30/2021] methylphenidate (DAYTRANA) 10 mg/9hr patch Place 1 patch (10 mg total) onto the skin every afternoon as needed. DNF until 06/30/21  ? methylphenidate (DAYTRANA) 10 mg/9hr patch Place 1 patch (10 mg total) onto the skin every afternoon as needed  ? methylphenidate (DAYTRANA) 10 mg/9hr Place 1 patch onto the skin daily as needed (19m daily/10 mg as a prn). Take if extra if needed for ADHD. Wear patch for 9 hours only each day  ? methylphenidate (DAYTRANA) 15 mg/9hr Place 1 patch (15 mg total) onto the skin as directed on days not using 256mdose  ? methylphenidate (DAYTRANA) 15 mg/9hr Place 1 patch (15 mg total) onto the skin as directed on days not taking 2035m? methylphenidate (DAYTRANA)  15 mg/9hr Place 1 patch (15 mg total) onto the skin daily. DNF until 04/11/2021  ? methylphenidate (DAYTRANA) 15 mg/9hr Place 1 patch (15 mg total) onto the skin daily.  ? methylphenidate ([facility] mg/9h

## 2021-06-24 ENCOUNTER — Other Ambulatory Visit (HOSPITAL_COMMUNITY): Payer: Self-pay

## 2021-07-06 ENCOUNTER — Other Ambulatory Visit (HOSPITAL_COMMUNITY): Payer: Self-pay

## 2021-07-06 MED ORDER — AMOXICILLIN 500 MG PO CAPS
2000.0000 mg | ORAL_CAPSULE | ORAL | 1 refills | Status: DC
  Filled 2021-07-06 – 2021-07-23 (×2): qty 16, 4d supply, fill #0

## 2021-07-10 LAB — MYASTHENIA GRAVIS PANEL 2
A CHR BINDING ABS: <0.3 nmol/L
ACHR Blocking Abs: <15 % Inhibition (ref <15)
Acetylchol Modul Ab: 3 % Inhibition

## 2021-07-14 ENCOUNTER — Other Ambulatory Visit (HOSPITAL_COMMUNITY): Payer: Self-pay

## 2021-07-15 DIAGNOSIS — Z01419 Encounter for gynecological examination (general) (routine) without abnormal findings: Secondary | ICD-10-CM | POA: Diagnosis not present

## 2021-07-15 DIAGNOSIS — N907 Vulvar cyst: Secondary | ICD-10-CM | POA: Diagnosis not present

## 2021-07-22 ENCOUNTER — Ambulatory Visit (INDEPENDENT_AMBULATORY_CARE_PROVIDER_SITE_OTHER): Payer: 59 | Admitting: Neurology

## 2021-07-22 ENCOUNTER — Encounter: Payer: TRICARE For Life (TFL) | Admitting: Neurology

## 2021-07-22 ENCOUNTER — Telehealth: Payer: Self-pay | Admitting: Neurology

## 2021-07-22 DIAGNOSIS — R531 Weakness: Secondary | ICD-10-CM | POA: Diagnosis not present

## 2021-07-22 NOTE — Telephone Encounter (Signed)
Patient notified that EMG is normal, specifically, no evidence of myasthenia gravis.  All questions answered.  Symptoms of weakness most likely pain-related from fibromyalgia.  Encouraged her to try to be more active and engage in exercise program.  All questions answered.   ?

## 2021-07-22 NOTE — Procedures (Signed)
[facility]  ?[address], [address] ? [place], [place] [address] ?Tel: [phone] ?Fax:  [phone] ?Test Date:  07/22/2021 ? ?Patient: Rita Gregory DOB: Aug 11, 1945 Physician: Narda Amber, DO  ?Sex: Female Height: '4\' 10"'$  Ref Phys: Narda Amber, DO  ?ID#: [id number]   Technician:   ? ?Patient Complaints: ?This is a 76 year old female referred for evaluation of generalized weakness. ? ?NCV & EMG Findings: ?Extensive electrodiagnostic testing of the right upper and lower extremity shows: ?Right median and sural sensory responses are within normal limits. ?Right median and peroneal motor responses are within normal limits. ?Repetitive nerve stimulation of the median nerve recording at the abductor pollicis brevis is normal.  Specifically, there is no evidence of abnormal decrement.  Patient did not tolerate repetitive nerve stimulation of the peroneal nerve. ?There is no evidence of active or chronic motor axonal loss changes affecting any of the tested muscles.  Motor unit configuration and recruitment pattern is within normal limits. ? ?Impression: ?This is a normal study.  In particular, there is no evidence of a neuromuscular junction disorder (i.e. myasthenia gravis), sensorimotor polyneuropathy, or diffuse myopathy. ? ? ?___________________________ ?Narda Amber, DO ? ? ? ?Nerve Conduction Studies ?Anti Sensory Summary Table ? ? Stim Site NR Peak (ms) Norm Peak (ms) P-T Amp (?V) Norm P-T Amp  ?Right Median Anti Sensory (2nd Digit)  32?C  ?Wrist    3.0 <3.8 24.5 >10  ?Right Sural Anti Sensory (Lat Mall)  32?C  ?Calf    2.8 <4.6 11.7 >3  ? ?Motor Summary Table ? ? Stim Site NR Onset (ms) Norm Onset (ms) O-P Amp (mV) Norm O-P Amp Site1 Site2 Delta-0 (ms) Dist (cm) Vel (m/s) Norm Vel (m/s)  ?Right Median Motor (Abd Poll Brev)  32?C  ?Wrist    2.9 <4.0 8.1 >5 Elbow Wrist 4.4 23.0 52 >50  ?Elbow    7.3  7.8         ?Right Peroneal Motor (Ext Dig Brev)  32?C  ?Ankle    2.5 <6.0 4.0 >2.5 B Fib Ankle  6.8 31.0 46 >40  ?B Fib    9.3  3.6  Poplt B Fib 1.4 7.0 50 >40  ?Poplt    10.7  3.2         ?Right Peroneal TA Motor (Tib Ant)  32?C  ?Fib Head    2.5 <4.5 4.3 >3 Poplit Fib Head 1.3 7.0 54 >40  ?Poplit    3.8  4.3         ? ?EMG ? ? Side Muscle Ins Act Fibs Psw Fasc Number Recrt Dur Dur. Amp Amp. Poly Poly. Comment  ?Right AntTibialis Nml Nml Nml Nml Nml Nml Nml Nml Nml Nml Nml Nml N/A  ?Right Gastroc Nml Nml Nml Nml Nml Nml Nml Nml Nml Nml Nml Nml N/A  ?Right BicepsFemS Nml Nml Nml Nml Nml Nml Nml Nml Nml Nml Nml Nml N/A  ?Right Flex Dig Long Nml Nml Nml Nml Nml Nml Nml Nml Nml Nml Nml Nml N/A  ?Right RectFemoris Nml Nml Nml Nml Nml Nml Nml Nml Nml Nml Nml Nml N/A  ?Right 1stDorInt Nml Nml Nml Nml Nml Nml Nml Nml Nml Nml Nml Nml N/A  ?Right PronatorTeres Nml Nml Nml Nml Nml Nml Nml Nml Nml Nml Nml Nml N/A  ?Right Biceps Nml Nml Nml Nml Nml Nml Nml Nml Nml Nml Nml Nml N/A  ?Right Triceps Nml Nml Nml Nml Nml Nml Nml Nml Nml Nml Nml Nml N/A  ?  Right Deltoid Nml Nml Nml Nml Nml Nml Nml Nml Nml Nml Nml Nml N/A  ? ?RNS ? ? Trial # Label Amp 1 (mV)  O-P Amp 5 (mV)  O-P Amp % Dif Area 1 (mV?ms) Area 5 (mV?ms) Area % Dif Rep Rate Train Length Pause Time (min:sec) Comments  ?Right Abd Poll Brev  ?Tr 1 Baseline 8.09 8.09 0.0 22.32 20.88 -6.5 3.00 10 00:30   ?Tr 2 Post Exercise 8.17 8.15 -0.3 20.50 19.71 -3.9 3.00 10 01:00   ?Tr 3 1 Min Post 8.13 8.07 -0.8 20.11 19.19 -4.6 3.00 10 01:00   ?Tr 4 2 Min Post 8.13 8.07 -0.7 19.67 18.75 -4.7 3.00 10 01:00   ?Tr 5 3 Min Post 8.13 8.07 -0.7 19.34 18.50 -4.4 3.00 10 00:00   ?  ? ?  ? ?  ? ? ?Waveforms: ?    ? ?    ? ?   ? ? ? ? ?

## 2021-07-23 ENCOUNTER — Other Ambulatory Visit (HOSPITAL_COMMUNITY): Payer: Self-pay

## 2021-07-23 ENCOUNTER — Other Ambulatory Visit: Payer: Self-pay

## 2021-07-23 DIAGNOSIS — F900 Attention-deficit hyperactivity disorder, predominantly inattentive type: Secondary | ICD-10-CM | POA: Diagnosis not present

## 2021-07-23 DIAGNOSIS — G894 Chronic pain syndrome: Secondary | ICD-10-CM | POA: Diagnosis not present

## 2021-07-23 MED ORDER — LEXAPRO 20 MG PO TABS
20.0000 mg | ORAL_TABLET | Freq: Every day | ORAL | 1 refills | Status: DC
  Filled 2021-07-23 – 2021-08-23 (×2): qty 90, 90d supply, fill #0

## 2021-07-23 MED ORDER — TRAZODONE HCL 50 MG PO TABS
50.0000 mg | ORAL_TABLET | Freq: Every evening | ORAL | 1 refills | Status: DC | PRN
  Filled 2021-07-23: qty 180, 90d supply, fill #0

## 2021-07-23 MED ORDER — METHYLPHENIDATE 15 MG/9HR TD PTCH
15.0000 mg | MEDICATED_PATCH | Freq: Every day | TRANSDERMAL | 0 refills | Status: DC
  Filled 2021-07-23: qty 30, 30d supply, fill #0

## 2021-07-23 MED ORDER — METHYLPHENIDATE 10 MG/9HR TD PTCH: 10.0000 mg | MEDICATED_PATCH | Freq: Every day | TRANSDERMAL | 0 refills | Status: DC

## 2021-07-23 MED ORDER — METHYLPHENIDATE 10 MG/9HR TD PTCH
10.0000 mg | MEDICATED_PATCH | Freq: Every day | TRANSDERMAL | 0 refills | Status: DC
  Filled 2021-07-23 (×2): qty 30, 30d supply, fill #0

## 2021-07-23 MED ORDER — METHYLPHENIDATE 15 MG/9HR TD PTCH: 15.0000 mg | MEDICATED_PATCH | Freq: Every day | TRANSDERMAL | 0 refills | Status: DC

## 2021-07-23 MED ORDER — METHYLPHENIDATE 15 MG/9HR TD PTCH
15.0000 mg | MEDICATED_PATCH | Freq: Every day | TRANSDERMAL | 0 refills | Status: DC
  Filled 2021-08-23: qty 30, 30d supply, fill #0

## 2021-07-26 ENCOUNTER — Other Ambulatory Visit (HOSPITAL_COMMUNITY): Payer: Self-pay

## 2021-07-26 MED ORDER — PREMARIN 0.45 MG PO TABS
0.4500 mg | ORAL_TABLET | Freq: Every day | ORAL | 3 refills | Status: AC
  Filled 2021-07-26: qty 90, 90d supply, fill #0
  Filled 2021-11-24: qty 90, 90d supply, fill #1

## 2021-07-27 ENCOUNTER — Other Ambulatory Visit (HOSPITAL_COMMUNITY): Payer: Self-pay

## 2021-07-28 ENCOUNTER — Other Ambulatory Visit (HOSPITAL_COMMUNITY): Payer: Self-pay

## 2021-07-29 ENCOUNTER — Other Ambulatory Visit: Payer: Self-pay | Admitting: Obstetrics and Gynecology

## 2021-07-29 DIAGNOSIS — M8580 Other specified disorders of bone density and structure, unspecified site: Secondary | ICD-10-CM

## 2021-08-02 ENCOUNTER — Other Ambulatory Visit (HOSPITAL_COMMUNITY): Payer: Self-pay

## 2021-08-05 ENCOUNTER — Encounter: Payer: Self-pay | Admitting: Internal Medicine

## 2021-08-05 ENCOUNTER — Ambulatory Visit (INDEPENDENT_AMBULATORY_CARE_PROVIDER_SITE_OTHER): Payer: 59 | Admitting: Internal Medicine

## 2021-08-05 ENCOUNTER — Other Ambulatory Visit (HOSPITAL_COMMUNITY): Payer: Self-pay

## 2021-08-05 VITALS — BP 110/74 | HR 75 | Ht 58.75 in | Wt 179.6 lb

## 2021-08-05 DIAGNOSIS — E038 Other specified hypothyroidism: Secondary | ICD-10-CM | POA: Diagnosis not present

## 2021-08-05 DIAGNOSIS — E063 Autoimmune thyroiditis: Secondary | ICD-10-CM

## 2021-08-05 DIAGNOSIS — R7989 Other specified abnormal findings of blood chemistry: Secondary | ICD-10-CM | POA: Diagnosis not present

## 2021-08-05 DIAGNOSIS — E782 Mixed hyperlipidemia: Secondary | ICD-10-CM | POA: Diagnosis not present

## 2021-08-05 DIAGNOSIS — R7303 Prediabetes: Secondary | ICD-10-CM | POA: Diagnosis not present

## 2021-08-05 LAB — POCT GLYCOSYLATED HEMOGLOBIN (HGB A1C): Hemoglobin A1C: 6 % — AB (ref 4.0–5.6)

## 2021-08-05 MED ORDER — DEXCOM G7 SENSOR MISC
3.0000 | 4 refills | Status: DC
  Filled 2021-08-05: qty 9, 84d supply, fill #0

## 2021-08-05 NOTE — Patient Instructions (Addendum)
Please continue Synthroid 50 mcg daily. ? ?Take the thyroid hormone every day, with water, in am, at least 30 minutes before breakfast, separated by at least 4 hours from: ?- acid reflux medications ?- calcium ?- iron ?- multivitamins ? ?Move the Multivitamins at night. ? ?Please stop at the lab. ? ?Please come back for a follow-up appointment in 6 months. ?

## 2021-08-05 NOTE — Progress Notes (Signed)
Patient ID: [name], female   DOB: [birth year], 76 y.o.   MRN: [id number] ? ?This visit occurred during the SARS-CoV-2 public health emergency.  Safety protocols were in place, including screening questions prior to the visit, additional usage of staff PPE, and extensive cleaning of exam room while observing appropriate contact time as indicated for disinfecting solutions.  ? ?HPI: Rita Gregory is a 76 y.o. female, initially self-referred, returning for follow-up for prediabetes, since early 2000s, and suspected hypoglycemic spells, Hashimoto's hypothyroidism.  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 her husband, Dr. Tillman Sers, is also  my patient. ?She previously saw Dr. Elyse Hsu before he retired.  Last visit with me 4 months ago. ? ?Interim history: ?She continues to have increased urination (has stress incontinence - stable), blurry vision, headaches, dizziness, some nausea.  No chest pain, shortness of breath. ?At last visit, she mentioned back pain.  She has a bulging disc at C4-C5.  She had physical therapy.  She was planning to join [employer]. She did not do this yet as she has L knee pain.  She has a history of R knee replacement. ?She is eating out now - living in a hotel for 3 weeks 2/2 work being done on her house. ?She has palpitations  - occasionally. ?Since last visit, her insurance started to cover the freestyle libre 2 CGM.  However, she is interested in switching to the [place].  She inquires about a sample. ? ?Prediabetes: ? ?Reviewed history: ?Patient describes that she has longstanding sensitivity to blood sugar variations even within the normal range.   ? ?She describes that after she had attached a freestyle libre CGM, she realized that her blood sugars did not actually drop as low as she thought when she was feeling hypoglycemic, however, she does describe significant hypoglycemic symptoms including slurring of words and disorientation when sugars go to  68-70. ? ?When sugars get to around 80-90, she describes heartburn, vision changes, headaches, extreme fatigue, dizziness.  This happens mostly during the day, after meals.  She corrects them with 2 glucose tablets.  She also feels the needs to eat ice cream at night to raise her glucose around 115-120 when going to bed, to avoid lows overnight. ? ?Before our visit in 12/2020 had to change her sensor targets from 90 to 85 and then to 80 as it was alarming her too much as she could not sleep. ? ?Lowest blood sugars: 68 >> 65 >> 70s. ?Highest blood sugar: 130 >> 204 (Thanksgiving) >> 202-4. ? ?Reviewed patient's HbA1c levels: ?Lab Results  ?Component Value Date  ? HGBA1C 5.8 (A) 01/07/2021  ? HGBA1C 6.2 (H) 11/03/2020  ? HGBA1C 5.8 (H) 11/22/2016  ? ?Patient describes that she was started on 500 mg of metformin ER by Dr. Elyse Hsu in 08/2020, but she recently stopped due to concern for hypoglycemia. ?Pt is currently not on any medications for her prediabetes. ? ?Reviewed her CGM downloads: ? ? ?Previously: ? ? ?Previously: ? ? ?Pt's meals are: ?- Brunch: Half a cup of great grain cereal with 10 pecans and 10 for almonds, + half-and-half ?- Dinner: 4 to 6 ounces of meat or fish -usually present ?- Snacks: 1-2 -including ice cream at night ? ?- no CKD; last BUN/creatinine:  ? ?Lab Results  ?Component Value Date  ? BUN 15 05/21/2021  ? BUN 18 11/03/2020  ? CREATININE 0.83 05/21/2021  ? CREATININE 0.63 11/03/2020  ?11/03/2020: ACR 15 ?She is not on ARB or  ACE inhibitor. ? ?-+ HL; last set of lipids: ?Lab Results  ?Component Value Date  ? CHOL 172 11/03/2020  ? HDL 67 11/03/2020  ? [place] 75 11/03/2020  ? TRIG 201 (H) 11/03/2020  ? CHOLHDL 2.6 11/03/2020  ?On pravastatin 40 mg daily and Vascepa 2 g twice a day -started before our visit from 12/2020. ?  ?- last eye exam was in 2022. No DR. + dry macular degeneration, dry eyes.  ?  ?- no numbness and tingling in her feet.  Latest foot exam 11/05/2020. ?  ?Pt has FH of DM in  father - DM2. ?  ?She also has hypothyroidism: ?  ?Pt is on Synthroid 50 daily, taken: ?(Generic did not work for her in the past) ?- at night ?- fasting ?- at least 30 min from b'fast ?- no calcium ?- no iron ?- + multivitamins - usually at night but occasionally in am ?- + PPIs at night ?- not on Biotin ? ?TFTs reviewed: ?Lab Results  ?Component Value Date  ? TSH 0.63 05/21/2021  ? TSH 1.13 02/18/2021  ? TSH 4.00 11/03/2020  ? TSH 2.17 11/22/2016  ? ?She also has a history of a tiny thyroid nodule: ?08/30/2007: Thyroid ultrasound: ?Findings: Thyroid gland is lower limits of normal in size with the  ?right lobe measuring 3.3 cm long X 1.2 cm AP X 1.1 cm wide.  Left  ?lobe measures 3.9 cm long X 1.0 cm AP X 1.3 cm wide.  Isthmus  ?measures 2 mm AP.  Thyroid echotexture is homogeneous.  At the  ?medial mid left lobe the is cystic focus measuring 5 mm long X 3 mm  ?AP X 3 mm wide containing tiny anterior nonshadowing mural  ?echogenicity.  ?   ?IMPRESSION:  ?1. Solitary 5 mm slightly complex cyst at the medial mid left lobe  ?thyroid favoring cystic adenoma.  ?2.  Otherwise, negative.  ?  ?Pt denies: ?- feeling nodules in neck ?- hoarseness ?- dysphagia ?- choking ?- SOB with lying down ? ?She has vitamin D deficiency-managed by PCP: ?Latest vitamin D was higher than target: ?Lab Results  ?Component Value Date  ? VD25OH 101 (H) 11/03/2020  ? VD25OH 69 11/22/2016  ?She was previously on ergocalciferol 50,000 units weekly per PCP but switched to every 2 weeks in 10/2020. ? ?A B12 level was normal: ?Lab Results  ?Component Value Date  ? XBDZHGDJ24 911 11/03/2020  ? ?She also has a history of: ?ADHD-on Adderall ?Chronic pain syndrome ?Chronic fatigue ?Fibromyalgia-sees rheumatology -on methylphenidate patch for this and for ADD ?GERD ?HTN ?Back pain and shoulder pain-status post spinal surgery - Dr. Ellene Route ?Osteoarthritis ?Migraines ?Dry eyes-on Shirley Friar and Systain - possibly Sjogren ? ?ROS: ?+ see HPI ? ?Past Medical  History:  ?Diagnosis Date  ? Allergy   ? Arthritis of left acromioclavicular joint   ? Asthma   ? Blood transfusion without reported diagnosis   ? unsure when  ? Cervical disc disorder with left arm radiculopathy of cervical region 04/20/2017  ? frequent headaches  ? Chronic pain   ? Dr. Ellene Route follows  ? Colon polyp 11/09/2007  ? Hyperplastic   ? Depression   ? Diverticulosis of colon (without mention of hemorrhage) 11/09/2007  ? Dry eye   ? Esophageal reflux   ? Fibromyalgia   ? Hyperlipidemia   ? Hypertension   ? in the past  ? Hypothyroidism   ? Kyphoscoliosis   ? 40 degree bend ro right side  ?  Paroxysmal spells 02/13/2013  ? Patent foramen ovale   ? hx.- Dr. Lemmie Evens. Smith,cardiology- follows  ? Pre-diabetes   ? Controls with diet  ? Rotator cuff tear, non-traumatic, left   ? Shoulder impingement, left   ? ?Past Surgical History:  ?Procedure Laterality Date  ? ABDOMINAL HYSTERECTOMY    ? ANTERIOR LATERAL LUMBAR FUSION 4 LEVELS Left 06/16/2015  ? Procedure: ANTERIOR LATERAL LUMBAR FUSION THORACIC TWELVE-LUMBAR FOUR;  Surgeon: Kristeen Miss, MD;  Location: [facility];  Service: Neurosurgery;  Laterality: Left;  Thoracolumbar spine  ? APPENDECTOMY    ? CESAREAN SECTION    ? x2  ? CHOLECYSTECTOMY    ? COLONOSCOPY  03/23/2020  ? dry eyes    ? uses Restasis daily  ? ESOPHAGOGASTRODUODENOSCOPY  02/23/2012  ? Procedure: ESOPHAGOGASTRODUODENOSCOPY (EGD);  Surgeon: Irene Shipper, MD;  Location: [facility];  Service: Endoscopy;  Laterality: N/A;  ? JOINT REPLACEMENT    ? RTKA '05  ? KNEE ARTHROSCOPY W/ ACL RECONSTRUCTION    ? Rt. Knee  ? POSTERIOR LUMBAR FUSION 4 LEVEL N/A 06/16/2015  ? Procedure: POSTERIOR LUMBAR FUSION LUMBAR FOUR-FIVE LUMBAR FIVE-SACRAL ONE;  Surgeon: Kristeen Miss, MD;  Location: [place] [facility];  Service: Neurosurgery;  Laterality: N/A;  ? RADIOLOGY WITH ANESTHESIA N/A 06/15/2015  ? Procedure: RADIOLOGY WITH ANESTHESIA;  Surgeon: Medication Radiologist, MD;  Location: [facility];  Service: Radiology;  Laterality: N/A;   ? right rotator cuff  2011  ? sx  ? SHOULDER ARTHROSCOPY Left 04/28/2017  ? Procedure: ARTHROSCOPY SHOULDER WITH EXTENSIVE DEBRIDEMENT ROTATOR CUFF TEAR;  Surgeon: Elsie Saas, MD;  Location: [facility]

## 2021-08-06 ENCOUNTER — Other Ambulatory Visit (HOSPITAL_COMMUNITY): Payer: Self-pay

## 2021-08-06 LAB — VITAMIN D 25 HYDROXY (VIT D DEFICIENCY, FRACTURES): VITD: 98.09 ng/mL (ref 30.00–100.00)

## 2021-08-06 LAB — T4, FREE: Free T4: 0.85 ng/dL (ref 0.60–1.60)

## 2021-08-06 LAB — TSH: TSH: 1.6 u[IU]/mL (ref 0.35–5.50)

## 2021-08-12 ENCOUNTER — Other Ambulatory Visit (HOSPITAL_COMMUNITY): Payer: Self-pay

## 2021-08-23 ENCOUNTER — Other Ambulatory Visit: Payer: Self-pay | Admitting: Internal Medicine

## 2021-08-23 ENCOUNTER — Other Ambulatory Visit (HOSPITAL_COMMUNITY): Payer: Self-pay

## 2021-08-23 MED ORDER — ALBUTEROL SULFATE HFA 108 (90 BASE) MCG/ACT IN AERS
2.0000 | INHALATION_SPRAY | RESPIRATORY_TRACT | 5 refills | Status: DC | PRN
  Filled 2021-08-23: qty 18, 17d supply, fill #0

## 2021-08-23 MED ORDER — EPINEPHRINE 0.3 MG/0.3ML IJ SOAJ
INTRAMUSCULAR | 6 refills | Status: DC
  Filled 2021-08-23: qty 2, 2d supply, fill #0

## 2021-08-23 MED FILL — Pravastatin Sodium Tab 40 MG: ORAL | 90 days supply | Qty: 90 | Fill #0 | Status: AC

## 2021-08-24 ENCOUNTER — Other Ambulatory Visit (HOSPITAL_COMMUNITY): Payer: Self-pay

## 2021-08-24 DIAGNOSIS — H5213 Myopia, bilateral: Secondary | ICD-10-CM | POA: Diagnosis not present

## 2021-08-24 LAB — HM DIABETES EYE EXAM

## 2021-08-25 ENCOUNTER — Other Ambulatory Visit (HOSPITAL_COMMUNITY): Payer: Self-pay

## 2021-09-03 ENCOUNTER — Ambulatory Visit
Admission: EM | Admit: 2021-09-03 | Discharge: 2021-09-03 | Disposition: A | Discharge status: Home / Self Care | Payer: 59 | Attending: Urgent Care | Admitting: Urgent Care

## 2021-09-03 DIAGNOSIS — U071 COVID-19: Secondary | ICD-10-CM

## 2021-09-03 MED ORDER — BENZONATATE 100 MG PO CAPS: 100.0000 mg | ORAL_CAPSULE | Freq: Three times a day (TID) | ORAL | 0 refills | Status: DC | PRN

## 2021-09-03 MED ORDER — PAXLOVID (300/100) 20 X 150 MG & 10 X 100MG PO TBPK: ORAL_TABLET | ORAL | 0 refills | Status: DC

## 2021-09-03 MED ORDER — PROMETHAZINE HCL 6.25 MG/5ML PO SYRP: 6.2500 mg | ORAL_SOLUTION | Freq: Four times a day (QID) | ORAL | 0 refills | Status: DC | PRN

## 2021-09-03 NOTE — ED Provider Notes (Addendum)
[place]   MRN: [id number] DOB: Aug 25, 1945  Subjective:   Rita Gregory is a 76 y.o. female presenting for 2-day history of acute onset coughing, intermittent shortness of breath, body aches, sinus congestion and throat pain.  Patient had exposure to COVID-19 when she was in [place] for a funeral.  They were notified later that they had this exposure.  No history of CKD.  Patient last had labs done including her creatinine level February of this year.  No current facility-administered medications for this encounter.  Current Outpatient Medications:    acetaminophen (TYLENOL) 650 MG CR tablet, Take 650 mg by mouth every 8 (eight) hours as needed for pain., Disp: , Rfl:    albuterol (VENTOLIN HFA) 108 (90 Base) MCG/ACT inhaler, Inhale 2 puffs into the lungs every 4 (four) hours as needed for wheezing, Disp: 18 g, Rfl: 5   amoxicillin (AMOXIL) 500 MG capsule, Take 4 capsules (2,000 mg total) by mouth as directed 1 hour prior to treatment, Disp: 16 capsule, Rfl: 1   conjugated estrogens (PREMARIN) vaginal cream, Place 0.625 mg vaginally daily as needed. , Disp: , Rfl:    Continuous Blood Gluc Receiver (FREESTYLE LIBRE 2 READER) DEVI, Use as instructed to check blood sugar, Disp: 1 each, Rfl: 0   Continuous Blood Gluc Sensor (DEXCOM G7 SENSOR) MISC, Apply 1 sensor every 10 days, Disp: 9 each, Rfl: 4   Continuous Blood Gluc Sensor (FREESTYLE LIBRE 2 SENSOR) MISC, Inject into skin every 14 days as directed for glucose monitoring, Disp: 4 each, Rfl: 12   Continuous Blood Gluc Sensor (FREESTYLE LIBRE 2 SENSOR) MISC, Inject into the skin every 14 days. Use as directed for 14 days per sensor for glucose monitoring, Disp: 12 each, Rfl: 4   COVID-19 At Home Antigen Test (CARESTART COVID-19 HOME TEST) KIT, Use  as directed, Disp: 4 each, Rfl: 0   EPINEPHrine 0.3 mg/0.3 mL IJ SOAJ injection, Use as directed for severe allergic reaction, Disp: 2 each, Rfl: 6   estrogens,  conjugated, (PREMARIN) 0.45 MG tablet, TAKE 1 TABLET BY MOUTH ONCE DAILY., Disp: 90 tablet, Rfl: 3   estrogens, conjugated, (PREMARIN) 0.45 MG tablet, Take 1 tablet (0.45 mg total) by mouth daily., Disp: 90 tablet, Rfl: 3   glucose blood test strip, Use to test blood sugar 2 times daily, Disp: 100 each, Rfl: 8   hydrocortisone 2.5 % cream, as needed., Disp: , Rfl:    icosapent Ethyl (VASCEPA) 1 g capsule, Take 2 capsules (2 g total) by mouth 2 (two) times daily with a meal., Disp: 360 capsule, Rfl: 4   ketoconazole (NIZORAL) 2 % cream, Apply 1 application topically 2 (two) times daily as needed for irritation. , Disp: , Rfl:    L-Methylfolate-Algae (DEPLIN 15) 15-90.314 MG CAPS, Take 1 capsule by mouth daily (Patient not taking: Reported on 05/21/2021), Disp: 90 capsule, Rfl: 1   Lancets (FREESTYLE) lancets, Use to test blood sugar twice a day, Disp: 200 each, Rfl: 4   LEXAPRO 20 MG tablet, Take 1 tablet (20 mg total) by mouth daily., Disp: 90 tablet, Rfl: 1   Lifitegrast (XIIDRA) 5 % SOLN, Place 1 drop into both eyes 2 (two) times daily., Disp: 900 each, Rfl: 4   loratadine (CLARITIN) 10 MG tablet, Take 10 mg by mouth daily as needed for allergies., Disp: , Rfl:    meclizine (ANTIVERT) 25 MG tablet, Take 25 mg by mouth 3 (three) times daily as needed for dizziness., Disp: ,  Rfl:    methylphenidate (DAYTRANA) 10 mg/9hr patch, Place 1 patch (10 mg total) onto the skin daily in the afternoon as needed, Disp: 30 patch, Rfl: 0   methylphenidate (DAYTRANA) 15 mg/9hr, Place 1 patch (15 mg total) onto the skin as directed on days not using 89m dose, Disp: 30 patch, Rfl: 0   methylphenidate (DAYTRANA) 15 mg/9hr, Place 1 patch (15 mg total) onto the skin daily. DNF until 04/11/2021, Disp: 30 patch, Rfl: 0   methylphenidate (DAYTRANA) 15 mg/9hr, Place 1 patch (15 mg total) onto the skin daily., Disp: 30 patch, Rfl: 0   methylphenidate (DAYTRANA) 15 mg/9hr, Place 1 patch (15 mg total) onto the skin daily., Disp:  30 patch, Rfl: 0   methylphenidate (DAYTRANA) 15 mg/9hr, Place 1 patch (15 mg total) onto the skin daily. DNF 06/03/21, Disp: 30 patch, Rfl: 0   methylphenidate (DAYTRANA) 15 mg/9hr, Place 1 patch (15 mg total) onto the skin daily., Disp: 30 patch, Rfl: 0   methylphenidate (DAYTRANA) 15 mg/9hr, Place 1 patch (15 mg total) onto the skin daily., Disp: 30 patch, Rfl: 0   methylphenidate (DAYTRANA) 15 mg/9hr, Place 1 patch (15 mg total) onto the skin daily., Disp: 30 patch, Rfl: 0   miconazole (MONISTAT 7) 2 % vaginal cream, Use as needed, Disp: , Rfl:    Multiple Vitamins-Minerals (CENTRUM SILVER PO), Take 1 tablet by mouth. CENTRUM SILVER GUMMY ONE A DAY, Disp: , Rfl:    ondansetron (ZOFRAN ODT) 4 MG disintegrating tablet, 414mODT q4 hours prn nausea/vomit (Patient not taking: Reported on 06/23/2021), Disp: 20 tablet, Rfl: 0   oxymetazoline (AFRIN) 0.05 % nasal spray, Use 1-2 sprays each nostril every 12 hours as needed, for occasional use only, Disp: , Rfl:    pantoprazole (PROTONIX) 40 MG tablet, Take 1 tablet (40 mg total) by mouth daily., Disp: 90 tablet, Rfl: 3   Polyethyl Glycol-Propyl Glycol (SYSTANE) 0.4-0.3 % GEL ophthalmic gel, Apply to eye as needed., Disp: , Rfl:    Polyvinyl Alcohol-Povidone (REFRESH OP), Apply to eye., Disp: , Rfl:    pravastatin (PRAVACHOL) 40 MG tablet, Take 1 tablet (40 mg total) by mouth daily for cholesterol, Disp: 90 tablet, Rfl: 4   pravastatin (PRAVACHOL) 40 MG tablet, Take 1 tablet (40 mg total) by mouth daily for cholesterol, Disp: 90 tablet, Rfl: 3   PREMARIN 0.45 MG tablet, Take 0.45 mg by mouth daily., Disp: , Rfl:    silver sulfADIAZINE (SILVADENE) 1 % cream, APPLY TO THE AFFECTED AREA(S) AS NEEDED FOR BURN AS DIRECTED, Disp: 25 g, Rfl: 2   SYNTHROID 50 MCG tablet, Take 1 tablet (50 mcg total) by mouth daily before breakfast., Disp: 90 tablet, Rfl: 3   testosterone (ANDROGEL) 50 MG/5GM (1%) GEL, Use as directed 3 times a week as needed, Disp: , Rfl:     traZODone (DESYREL) 50 MG tablet, TAKE 1-2 TABLETS BY MOUTH AT BEDTIME AS NEEDED FOR SLEEP, Disp: 180 tablet, Rfl: 1   traZODone (DESYREL) 50 MG tablet, Take 1-2 tablets (50-100 mg total) by mouth at bedtime as needed for sleep, Disp: 180 tablet, Rfl: 1   traZODone (DESYREL) 50 MG tablet, Take 1-2 tablets (50-100 mg total) by mouth at bedtime as needed for sleep, Disp: 180 tablet, Rfl: 1   traZODone (DESYREL) 50 MG tablet, Take 1-2 tablets (50-100 mg total) by mouth at bedtime as needed for sleep., Disp: 180 tablet, Rfl: 1   traZODone (DESYREL) 50 MG tablet, Take 1-2 tablets (50-100 mg total) by mouth  at bedtime as needed for sleep, Disp: 180 tablet, Rfl: 1   triamcinolone (KENALOG) 0.1 %, Apply topically in the morning and at bedtime. (Patient not taking: Reported on 06/23/2021), Disp: , Rfl:    Vitamin D, Ergocalciferol, (DRISDOL) 50000 units CAPS capsule, Take 50,000 Units by mouth every 14 (fourteen) days., Disp: , Rfl:    Allergies  Allergen Reactions   Betadine [Povidone Iodine] Other (See Comments)    Blister hands and feet     Fentanyl Shortness Of Breath    Pt has tolerated this medication 06/2015 pre and during operation. MD ok giving.  Low dose only   Morphine And Related Anaphylaxis    respiratory depression    Bupropion Hcl     Dizzy, fog   Codeine Nausea And Vomiting   Demerol [Meperidine] Nausea And Vomiting   Dextromethorphan Other (See Comments)    Unknown    Guaifenesin & Derivatives     Other, unknown   Lipitor [Atorvastatin]     Myalgia   Lovaza [Omega-3-Acid Ethyl Esters] Diarrhea   Lyrica [Pregabalin]     Fatigue and depression   Naltrexone     depression   Percodan [Oxycodone-Aspirin] Nausea And Vomiting   Polyethylene Glycol     Blurred vision, headache, and causes bright red skin irritation if on skin.   Prilosec [Omeprazole]     Iliacs     Topamax [Topiramate]     disorientation    Valium [Diazepam]     Small doses are OK but large doses are not.  Long-term use causes depression.    Versed [Midazolam]     In moderate in large doses shortness of breath, and respiratory depression    Adhesive [Tape] Rash    Skin irritation    Concerta [Methylphenidate] Palpitations   Doxycycline Rash    Pruritic rash   Lamotrigine Rash   Nickel Rash    Skin irritation    Oxycodone-Acetaminophen Nausea And Vomiting and Rash    depression   Percocet [Oxycodone-Acetaminophen] Nausea And Vomiting and Rash    Past Medical History:  Diagnosis Date   Allergy    Arthritis of left acromioclavicular joint    Asthma    Blood transfusion without reported diagnosis    unsure when   Cervical disc disorder with left arm radiculopathy of cervical region 04/20/2017   frequent headaches   Chronic pain    Dr. Ellene Route follows   Colon polyp 11/09/2007   Hyperplastic    Depression    Diverticulosis of colon (without mention of hemorrhage) 11/09/2007   Dry eye    Esophageal reflux    Fibromyalgia    Hyperlipidemia    Hypertension    in the past   Hypothyroidism    Kyphoscoliosis    40 degree bend ro right side   Paroxysmal spells 02/13/2013   Patent foramen ovale    hx.- Dr. Lemmie Evens. Smith,cardiology- follows   Pre-diabetes    Controls with diet   Rotator cuff tear, non-traumatic, left    Shoulder impingement, left      Past Surgical History:  Procedure Laterality Date   ABDOMINAL HYSTERECTOMY     ANTERIOR LATERAL LUMBAR FUSION 4 LEVELS Left 06/16/2015   Procedure: ANTERIOR LATERAL LUMBAR FUSION THORACIC TWELVE-LUMBAR FOUR;  Surgeon: Kristeen Miss, MD;  Location: [place];  Service: Neurosurgery;  Laterality: Left;  Thoracolumbar spine   APPENDECTOMY     CESAREAN SECTION     x2   CHOLECYSTECTOMY     COLONOSCOPY  03/23/2020   dry eyes     uses Restasis daily   ESOPHAGOGASTRODUODENOSCOPY  02/23/2012   Procedure: ESOPHAGOGASTRODUODENOSCOPY (EGD);  Surgeon: Irene Shipper, MD;  Location: [facility];  Service: Endoscopy;  Laterality: N/A;   JOINT  REPLACEMENT     RTKA '05   KNEE ARTHROSCOPY W/ ACL RECONSTRUCTION     Rt. Knee   POSTERIOR LUMBAR FUSION 4 LEVEL N/A 06/16/2015   Procedure: POSTERIOR LUMBAR FUSION LUMBAR FOUR-FIVE LUMBAR FIVE-SACRAL ONE;  Surgeon: Kristeen Miss, MD;  Location: [place] [facility] [facility];  Service: Neurosurgery;  Laterality: N/A;   RADIOLOGY WITH ANESTHESIA N/A 06/15/2015   Procedure: RADIOLOGY WITH ANESTHESIA;  Surgeon: Medication Radiologist, MD;  Location: [facility];  Service: Radiology;  Laterality: N/A;   right rotator cuff  2011   sx   SHOULDER ARTHROSCOPY Left 04/28/2017   Procedure: ARTHROSCOPY SHOULDER WITH EXTENSIVE DEBRIDEMENT ROTATOR CUFF TEAR;  Surgeon: Elsie Saas, MD;  Location: [facility];  Service: Orthopedics;  Laterality: Left;   UPPER GASTROINTESTINAL ENDOSCOPY  03/23/2020    Family History  Problem Relation Age of Onset   Ovarian cancer Mother        passed at age 77   Hypertension Mother    Heart disease Father    Kidney disease Father        Kidney cancer   Diabetes type II Father        passed at 110 yrs old   Colon polyps Father    Myasthenia gravis Father        passed from [place] gravis   Bipolar disorder Sister        Osteopenia   Bipolar disorder Son    Esophageal cancer Neg Hx    Rectal cancer Neg Hx    Stomach cancer Neg Hx    Colon cancer Neg Hx     Social History   Tobacco Use   Smoking status: Never    Passive exposure: Yes   Smokeless tobacco: Never   Tobacco comments:    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    Appearance: Normal appearance. She is well-developed. She is not ill-appearing,  toxic-appearing or diaphoretic.  HENT:     Head: Normocephalic and atraumatic.     Nose: Nose normal.     Mouth/Throat:     Mouth: Mucous membranes are moist.  Eyes:     General: No scleral icterus.       Right eye: No discharge.        Left eye: No discharge.     Extraocular Movements: Extraocular movements intact.  Cardiovascular:     Rate and Rhythm: Normal rate and regular rhythm.     Heart sounds: Normal heart sounds. No murmur heard.   No friction rub. No gallop.  Pulmonary:     Effort: Pulmonary effort is normal. No respiratory distress.     Breath sounds: No stridor. No wheezing, rhonchi or rales.  Chest:     Chest wall: No tenderness.  Skin:  General: Skin is warm and dry.  Neurological:     General: No focal deficit present.     Mental Status: She is alert and oriented to person, place, and time.  Psychiatric:        Mood and Affect: Mood normal.        Behavior: Behavior normal.    Assessment and Plan :   PDMP not reviewed this encounter.  1. Clinical diagnosis of COVID-19    Patient presented with her husband who would have like testing for flu as well.  This testing is pending.  Recommended starting Paxlovid at the full dosing given no history of kidney disease.  Recommend supportive care otherwise.  We will notify them of the results and update treatment plan as necessary. Deferred imaging given clear cardiopulmonary exam, hemodynamically stable vital signs.  Despite a history of asthma, patient declined prednisone course.  I did a thorough review of medication interactions with Paxlovid and discussed them with patient.  Counseled patient on potential for adverse effects with medications prescribed/recommended today, ER and return-to-clinic precautions discussed, patient verbalized understanding.   Jaynee Eagles, PA-C 09/03/21 1504

## 2021-09-03 NOTE — ED Triage Notes (Signed)
The patient states she had an exposure to covid and  c/o SOB, muscular aches, headache, cough, congestion and sore throat.  Started: 2 day ago

## 2021-09-04 LAB — NOVEL CORONAVIRUS, NAA: SARS-CoV-2, NAA: NOT DETECTED

## 2021-09-14 ENCOUNTER — Ambulatory Visit (INDEPENDENT_AMBULATORY_CARE_PROVIDER_SITE_OTHER): Payer: 59 | Admitting: Internal Medicine

## 2021-09-14 ENCOUNTER — Encounter: Payer: Self-pay | Admitting: Internal Medicine

## 2021-09-14 DIAGNOSIS — U071 COVID-19: Secondary | ICD-10-CM | POA: Diagnosis not present

## 2021-09-14 DIAGNOSIS — R053 Chronic cough: Secondary | ICD-10-CM | POA: Diagnosis not present

## 2021-09-14 MED ORDER — HYDROCODONE BIT-HOMATROP MBR 5-1.5 MG/5ML PO SOLN: 5.0000 mL | Freq: Three times a day (TID) | ORAL | 0 refills | Status: DC | PRN

## 2021-09-14 MED ORDER — AZITHROMYCIN 250 MG PO TABS: ORAL_TABLET | ORAL | 0 refills | Status: AC

## 2021-09-14 NOTE — Patient Instructions (Addendum)
Patient advised to purchase pulse oximetry monitor pulse ox at home.  Walk some to prevent atelectasis.  Take Zithromax Z-PAK 2 tabs day 1 followed by 1 tab days 2 through 5.  Take Hycodan sparingly for cough 1 teaspoon every 8 hours if needed.  Call if symptoms not improving or sooner if worse.  Stay well-hydrated.

## 2021-09-14 NOTE — Progress Notes (Addendum)
   Subjective:    Patient ID: Rita Gregory, female    DOB: 1945/06/11, 76 y.o.   MRN: [id number]  HPI 76 year old Female seen at Urgent Care at [facility] on May 26th with coughing, myalgias,sore throat, intermittent SOB.  On same day, husband tested positive for Covid-19. She and her husband had attended a funeral in [place] and were notified later that they were exposed to Covid-19.She tested negative for Covid-19 on May 26th at Urgent Care with PCR testing but husband  tested positive.She was given Paxlovid at Urgent Care. Also was given low dose Phenergan tablets and Tessalon perles. She and her husband both took Paxlovid. Now, she has cough, malaise and fatigue.She feels that she likely too had Covid-19 as she had similar symptoms.  We connected via audio only today.She is agreeable to speaking with me and is asking for advice.  She is identified using 2 identifiers as Rita Gregory. Alonge, a patient in this practice.  She is at her home and I am at my office.  Rita Gregory had [id number]-[id number] in July 2022.  Our records indicate her last vaccine for COVID-19 was May 2022.  She has a history of fibromyalgia syndrome, osteoarthritis, fatigue and migraine headaches.  She has stress urinary incontinence.  She has attention deficit disorder, GE reflux, insomnia and osteoarthritis.  She has a history of right knee arthroplasty, migraine headaches, hypertriglyceridemia, impaired glucose tolerance and musculoskeletal pain.  She takes low-dose thyroid replacement medication.  Chart reviewed from Urgent care visit May 26 as well as health maintenance exam July 2022.    Review of Systems  see above- No vomiting but malaise and fatigue.  Some cough.     Objective:   Physical Exam  This was not a video visit.  Despite attempts, we were able only to connect by audio due to technical difficulties.  She does not have a pulse oximetry device.  Currently does not sound tachypneic.      Assessment &  Plan:   COVID-19 virus infection by history and exposure to husband who tested positive for COVID-19.  She was recently treated in late May at same time as her husband with Paxlovid but still has malaise and fatigue and some cough.  Plan: She reports that she is currently afebrile.  She will be treated with Zithromax Z-PAK 2 tabs day 1 followed by 1 tab days 2 through 5 and Hycodan 1 teaspoon every 8 hours as needed for cough.  She should purchase a pulse oximeter and monitor her pulse ox and walk some to prevent atelectasis.  Call if symptoms not improving.  Stay well-hydrated.  Time spent with this visit is 15 minutes.

## 2021-10-07 ENCOUNTER — Other Ambulatory Visit: Payer: Self-pay | Admitting: Internal Medicine

## 2021-10-07 ENCOUNTER — Other Ambulatory Visit (HOSPITAL_COMMUNITY): Payer: Self-pay

## 2021-10-07 MED ORDER — FREESTYLE LIBRE 2 SENSOR MISC
4 refills | Status: DC
Start: 2021-10-07 — End: 2022-07-12
  Filled 2021-10-07: qty 2, 28d supply, fill #0
  Filled 2021-11-11: qty 2, 28d supply, fill #1
  Filled 2022-01-04: qty 2, 28d supply, fill #2
  Filled 2022-02-07: qty 2, 28d supply, fill #3

## 2021-10-08 ENCOUNTER — Other Ambulatory Visit (HOSPITAL_COMMUNITY): Payer: Self-pay

## 2021-10-14 ENCOUNTER — Other Ambulatory Visit (HOSPITAL_COMMUNITY): Payer: Self-pay

## 2021-10-15 ENCOUNTER — Other Ambulatory Visit (HOSPITAL_COMMUNITY): Payer: Self-pay

## 2021-10-15 MED ORDER — METHYLPHENIDATE 10 MG/9HR TD PTCH
10.0000 mg | MEDICATED_PATCH | Freq: Every day | TRANSDERMAL | 0 refills | Status: DC
  Filled 2021-10-15: qty 30, 30d supply, fill #0

## 2021-10-15 MED ORDER — METHYLPHENIDATE 15 MG/9HR TD PTCH
15.0000 mg | MEDICATED_PATCH | Freq: Every day | TRANSDERMAL | 0 refills | Status: DC
Start: 2021-10-15 — End: 2022-01-24
  Filled 2021-10-15: qty 30, 30d supply, fill #0

## 2021-10-18 ENCOUNTER — Other Ambulatory Visit (HOSPITAL_COMMUNITY): Payer: Self-pay

## 2021-11-02 ENCOUNTER — Telehealth: Payer: Self-pay | Admitting: Internal Medicine

## 2021-11-02 NOTE — Telephone Encounter (Signed)
Rita Gregory  [phone]  Kellan called wanting to be seen about a Knot in breast/epigastric area, that has been there for awhile but now seems to be growing and bothering her, she stated it is the size of a tea cup, and she is also having some shortness of breath on exertion.

## 2021-11-02 NOTE — Telephone Encounter (Addendum)
Scheduled appointment for Thursday.   I  have spoken with Mrs. Gagan today about this. She believes this to be a hernia. No hernia was identified on upper GI endoscopy by Dr. Henrene Pastor  In December 2021- so this is not a hiatal hernia. She is concerned about this area in her chest and  she will see me Thursday.  MJB, MD

## 2021-11-03 ENCOUNTER — Other Ambulatory Visit (HOSPITAL_COMMUNITY): Payer: Self-pay

## 2021-11-03 DIAGNOSIS — G894 Chronic pain syndrome: Secondary | ICD-10-CM | POA: Diagnosis not present

## 2021-11-03 DIAGNOSIS — F900 Attention-deficit hyperactivity disorder, predominantly inattentive type: Secondary | ICD-10-CM | POA: Diagnosis not present

## 2021-11-03 MED ORDER — METHYLPHENIDATE 15 MG/9HR TD PTCH
15.0000 mg | MEDICATED_PATCH | Freq: Every day | TRANSDERMAL | 0 refills | Status: AC
  Filled 2022-02-07: qty 30, 30d supply, fill #0

## 2021-11-03 MED ORDER — METHYLPHENIDATE 15 MG/9HR TD PTCH
15.0000 mg | MEDICATED_PATCH | Freq: Every day | TRANSDERMAL | 0 refills | Status: DC
  Filled 2022-01-04: qty 30, 30d supply, fill #0

## 2021-11-03 MED ORDER — METHYLPHENIDATE 10 MG/9HR TD PTCH
10.0000 mg | MEDICATED_PATCH | Freq: Every day | TRANSDERMAL | 0 refills | Status: AC
  Filled 2022-02-07: qty 30, 30d supply, fill #0

## 2021-11-03 MED ORDER — METHYLPHENIDATE 15 MG/9HR TD PTCH
15.0000 mg | MEDICATED_PATCH | Freq: Every day | TRANSDERMAL | 0 refills | Status: DC
  Filled 2021-11-25: qty 30, 30d supply, fill #0

## 2021-11-03 MED ORDER — METHYLPHENIDATE 10 MG/9HR TD PTCH
10.0000 mg | MEDICATED_PATCH | Freq: Every day | TRANSDERMAL | 0 refills | Status: DC
  Filled 2021-11-25: qty 30, 30d supply, fill #0

## 2021-11-03 MED ORDER — LEXAPRO 20 MG PO TABS
20.0000 mg | ORAL_TABLET | Freq: Every day | ORAL | 0 refills | Status: DC
  Filled 2021-11-03: qty 90, 90d supply, fill #0

## 2021-11-03 MED ORDER — TRAZODONE HCL 50 MG PO TABS
50.0000 mg | ORAL_TABLET | Freq: Every evening | ORAL | 0 refills | Status: AC | PRN
  Filled 2021-11-03: qty 180, 90d supply, fill #0

## 2021-11-03 MED ORDER — METHYLPHENIDATE 10 MG/9HR TD PTCH
10.0000 mg | MEDICATED_PATCH | Freq: Every day | TRANSDERMAL | 0 refills | Status: DC
  Filled 2022-01-04: qty 30, 30d supply, fill #0

## 2021-11-04 ENCOUNTER — Ambulatory Visit (INDEPENDENT_AMBULATORY_CARE_PROVIDER_SITE_OTHER): Payer: 59 | Admitting: Internal Medicine

## 2021-11-04 ENCOUNTER — Other Ambulatory Visit: Payer: Self-pay

## 2021-11-04 ENCOUNTER — Other Ambulatory Visit (HOSPITAL_COMMUNITY): Payer: Self-pay

## 2021-11-04 ENCOUNTER — Ambulatory Visit
Admission: RE | Admit: 2021-11-04 | Discharge: 2021-11-04 | Disposition: A | Discharge status: Home / Self Care | Payer: 59 | Source: Ambulatory Visit | Attending: Internal Medicine | Admitting: Internal Medicine

## 2021-11-04 ENCOUNTER — Encounter: Payer: Self-pay | Admitting: Internal Medicine

## 2021-11-04 VITALS — BP 112/80

## 2021-11-04 DIAGNOSIS — R0602 Shortness of breath: Secondary | ICD-10-CM | POA: Diagnosis not present

## 2021-11-04 DIAGNOSIS — M958 Other specified acquired deformities of musculoskeletal system: Secondary | ICD-10-CM

## 2021-11-04 MED ORDER — PRAVASTATIN SODIUM 40 MG PO TABS
40.0000 mg | ORAL_TABLET | Freq: Every day | ORAL | 3 refills | Status: DC
  Filled 2021-11-04: qty 90, 90d supply, fill #0

## 2021-11-04 MED ORDER — ICOSAPENT ETHYL 1 G PO CAPS
2.0000 g | ORAL_CAPSULE | Freq: Two times a day (BID) | ORAL | 4 refills | Status: DC
  Filled 2021-11-04: qty 360, 90d supply, fill #0

## 2021-11-04 MED ORDER — PANTOPRAZOLE SODIUM 40 MG PO TBEC
40.0000 mg | DELAYED_RELEASE_TABLET | Freq: Every day | ORAL | 3 refills | Status: DC
  Filled 2021-11-04: qty 90, 90d supply, fill #0

## 2021-11-04 MED ORDER — PRAVASTATIN SODIUM 40 MG PO TABS: 40.0000 mg | ORAL_TABLET | Freq: Every day | ORAL | 3 refills | Status: DC

## 2021-11-04 NOTE — Progress Notes (Deleted)
   Subjective:    Patient ID: Rita Gregory, female    DOB: [birth year], 76 y.o.   MRN: [id number]  HPI    Review of Systems     Objective:   Physical Exam        Assessment & Plan:

## 2021-11-05 NOTE — Patient Instructions (Addendum)
Referral to be made to Dr. Harlow Asa for evaluation of area and upper abdomen patient is concerned about.  A chest x-ray has been obtained.

## 2021-11-05 NOTE — Progress Notes (Signed)
   Subjective:    Patient ID: Rita Gregory, female    DOB: 04-Jan-1946, 76 y.o.   MRN: [id number]  HPI Rita Gregory is seen today concerned about a prominence in her epigastric area slightly right of midline.  She thinks it may be a hernia.  She thinks it has been there for some time but thinks it is a bit larger.  She has noted some shortness of breath.  Patient has osteoarthritis, fibromyalgia and migraine headaches.  She had total right knee arthroplasty and rotator cuff in the past.  History of torn right ACL in 07-20-83.  History of scoliosis and lumbar fusion.  Failed bilateral rotator cuff surgery.  She had endoscopy and colonoscopy in December 2021 by Dr. Scarlette Shorts.  The esophagus and stomach were both normal.  The duodenum was normal.  She has a history of GE reflux treated with PPI.  She takes low-dose thyroid replacement medication.  She takes Lexapro and trazodone.  She uses a Daytrana patch for attention deficit.  She has had 2 pregnancies and no miscarriages.  History of scoliosis and thoracic spondylosis with myelopathy.  History of attention deficit disorder seen by Dr. Wylene Simmer in [place].  Social history: She is married to Dr. Clemetine Marker, Endocrinologist, currently practicing here in [place].  She has 2 sons.  Family history: Sister with history of bipolar disorder.  1 brother in good health.  Father died in 07/20/11 with history of cardiovascular disease and myasthenia gravis.  Mother died in 1 with ovarian cancer and history of hypertension.  She takes Vascepa for hyperlipidemia.    Review of Systems see above she reports no nausea, vomiting ,diarrhea, or constipation.  She is anxious about this area in her upper abdomen.     Objective:   Physical Exam Blood pressure 112/80 Skin warm and dry.  No cervical adenopathy or thyromegaly.  Chest clear.  Cardiac exam: Regular rate and rhythm.  Abdomen obese soft nondistended without hepatosplenomegaly masses or  tenderness.  When she stands, there appears to be an approximate 4 to 5 cm in diameter circular area just right to the mid epigastric area that is nontender.  It may be a mild defect in the abdominal wall.       Assessment & Plan:  ?  Hernia of abdominal wall  Chest x-ray was obtained and is within normal limits.  Plan: Patient would like very much to see Dr. Harlow Asa for evaluation of this area.  It has are quite concerned.  Basically it is not painful.  There is no erythema.  She is able to eat and has normal bowel movements.

## 2021-11-11 ENCOUNTER — Other Ambulatory Visit (HOSPITAL_COMMUNITY): Payer: Self-pay

## 2021-11-17 ENCOUNTER — Ambulatory Visit: Payer: Self-pay | Admitting: Surgery

## 2021-11-17 DIAGNOSIS — K439 Ventral hernia without obstruction or gangrene: Secondary | ICD-10-CM | POA: Diagnosis not present

## 2021-11-18 ENCOUNTER — Other Ambulatory Visit (HOSPITAL_COMMUNITY): Payer: Self-pay

## 2021-11-22 ENCOUNTER — Other Ambulatory Visit (HOSPITAL_COMMUNITY): Payer: Self-pay

## 2021-11-24 ENCOUNTER — Other Ambulatory Visit (HOSPITAL_COMMUNITY): Payer: Self-pay

## 2021-11-25 ENCOUNTER — Other Ambulatory Visit (HOSPITAL_COMMUNITY): Payer: Self-pay

## 2021-11-26 ENCOUNTER — Other Ambulatory Visit (HOSPITAL_COMMUNITY): Payer: Self-pay

## 2022-01-04 ENCOUNTER — Other Ambulatory Visit (HOSPITAL_COMMUNITY): Payer: Self-pay

## 2022-01-04 ENCOUNTER — Ambulatory Visit: Payer: 59 | Admitting: Internal Medicine

## 2022-01-05 ENCOUNTER — Other Ambulatory Visit (HOSPITAL_COMMUNITY): Payer: Self-pay

## 2022-01-07 ENCOUNTER — Other Ambulatory Visit (HOSPITAL_COMMUNITY): Payer: Self-pay

## 2022-01-07 ENCOUNTER — Encounter: Payer: Self-pay | Admitting: Internal Medicine

## 2022-01-07 ENCOUNTER — Ambulatory Visit (INDEPENDENT_AMBULATORY_CARE_PROVIDER_SITE_OTHER): Payer: 59 | Admitting: Internal Medicine

## 2022-01-07 ENCOUNTER — Telehealth: Payer: Self-pay

## 2022-01-07 VITALS — BP 114/76 | HR 78 | Ht <= 58 in | Wt 179.0 lb

## 2022-01-07 DIAGNOSIS — E063 Autoimmune thyroiditis: Secondary | ICD-10-CM

## 2022-01-07 DIAGNOSIS — R7989 Other specified abnormal findings of blood chemistry: Secondary | ICD-10-CM

## 2022-01-07 DIAGNOSIS — E782 Mixed hyperlipidemia: Secondary | ICD-10-CM | POA: Diagnosis not present

## 2022-01-07 DIAGNOSIS — R7303 Prediabetes: Secondary | ICD-10-CM

## 2022-01-07 DIAGNOSIS — E038 Other specified hypothyroidism: Secondary | ICD-10-CM

## 2022-01-07 LAB — TSH: TSH: 3.88 u[IU]/mL (ref 0.35–5.50)

## 2022-01-07 LAB — POCT GLYCOSYLATED HEMOGLOBIN (HGB A1C): Hemoglobin A1C: 6 % — AB (ref 4.0–5.6)

## 2022-01-07 LAB — T4, FREE: Free T4: 0.91 ng/dL (ref 0.60–1.60)

## 2022-01-07 LAB — VITAMIN D 25 HYDROXY (VIT D DEFICIENCY, FRACTURES): VITD: >120 ng/mL

## 2022-01-07 MED ORDER — SYNTHROID 50 MCG PO TABS
50.0000 ug | ORAL_TABLET | Freq: Every day | ORAL | 3 refills | Status: DC
Start: 2022-01-07 — End: 2022-07-12
  Filled 2022-01-07: qty 90, 90d supply, fill #0

## 2022-01-07 NOTE — Addendum Note (Signed)
Addended by: Philemon Kingdom on: 01/07/2022 04:36 PM   Modules accepted: Orders

## 2022-01-07 NOTE — Telephone Encounter (Signed)
Patient has been informed of critical lab value of Vitamin D being too high. Per Dr. Cruzita Lederer she is to stop the vitamin D completely and recheck in about 3 months. Lab order will be placed. Patient expressed her understanding.

## 2022-01-07 NOTE — Addendum Note (Signed)
Addended by: Sarina Ill on: 01/07/2022 11:12 AM   Modules accepted: Orders

## 2022-01-07 NOTE — Patient Instructions (Signed)
Please continue Synthroid 50 mcg daily.  Take the thyroid hormone every day, with water, in am, at least 30 minutes before breakfast, separated by at least 4 hours from: - acid reflux medications - calcium - iron - multivitamins  Please stop at the lab.  Please come back for a follow-up appointment in 6 months.

## 2022-01-07 NOTE — Addendum Note (Signed)
Addended by: Sarina Ill on: 01/07/2022 03:32 PM   Modules accepted: Orders

## 2022-01-07 NOTE — Progress Notes (Addendum)
Patient ID: Rita Gregory, female   DOB: [birth year], 76 y.o.   MRN: [id number]  HPI: Rita Gregory is a 76 y.o. female, initially self-referred, returning for follow-up for prediabetes, since early 2000s, and suspected hypoglycemic spells, Hashimoto's hypothyroidism.  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 her husband, Dr. Tillman Sers, is also  my patient. She previously saw Dr. Elyse Hsu before he retired.  Last visit with me 6 months ago.  Interim history: She continues to have increased urination (has stress incontinence - stable), headaches, vertigo/dizziness.  No chest pain, shortness of breath. She also has back pain-bulging disc at C4-C5.  She had physical therapy and continues to exercise at home.  She has L knee pain.  She has a history of R knee replacement. She has hair loss, HA, vertigo.  Prediabetes:  Reviewed history: Patient describes that she has longstanding sensitivity to blood sugar variations even within the normal range.    She describes that after she had attached a freestyle libre CGM, she realized that her blood sugars did not actually drop as low as she thought when she was feeling hypoglycemic, however, she does describe significant hypoglycemic symptoms including slurring of words and disorientation when sugars go to 68-70.  When sugars get to around 80-90, she describes heartburn, vision changes, headaches, extreme fatigue, dizziness.  This happens mostly during the day, after meals.  She corrects them with 2 glucose tablets.  She also feels the needs to eat ice cream at night to raise her glucose around 115-120 when going to bed, to avoid lows overnight.  Before our visit in 12/2020 had to change her sensor targets from 90 to 85 and then to 80 as it was alarming her too much as she could not sleep.  Lowest blood sugars: 68 >> 65 >> 70s >> >70. Highest blood sugar: 130 >> 204 (Thanksgiving) >> 202-4 >> 209.  Reviewed patient's HbA1c levels: Lab  Results  Component Value Date   HGBA1C 6.0 (A) 08/05/2021   HGBA1C 5.8 (A) 01/07/2021   HGBA1C 6.2 (H) 11/03/2020   HGBA1C 5.8 (H) 11/22/2016   Patient describes that she was started on 500 mg of metformin ER by Dr. Elyse Hsu in 08/2020, but she recently stopped due to concern for hypoglycemia. Pt is currently not on any medications for her prediabetes.  Reviewed her CGM downloads:    Previously:   Previously:   Pt's meals are: - Brunch: Half a cup of great grain cereal with 10 pecans and 10 for almonds, + half-and-half - Dinner: 4 to 6 ounces of meat or fish -usually present - Snacks: 1-2 -including ice cream at night  - no CKD; last BUN/creatinine:   Lab Results  Component Value Date   BUN 15 05/21/2021   BUN 18 11/03/2020   CREATININE 0.83 05/21/2021   CREATININE 0.63 11/03/2020  11/03/2020: ACR 15 She is not on ARB or ACE inhibitor.  -+ HL; last set of lipids: Lab Results  Component Value Date   CHOL 172 11/03/2020   HDL 67 11/03/2020   LDLCALC 75 11/03/2020   TRIG 201 (H) 11/03/2020   CHOLHDL 2.6 11/03/2020  On pravastatin 40 mg daily and Vascepa 2 g twice a day -started before our visit from 12/2020.   - last eye exam was in 2023. No DR. + dry macular degeneration, dry eyes.    - no numbness and tingling in her feet.  Latest foot exam 11/05/2020.   Pt has FH of DM in father -  DM2.   She also has hypothyroidism:   Pt is on Synthroid 50 daily, taken: (Generic did not work for her in the past) - at night - fasting - at least 30 min from b'fast - no calcium - no iron - + multivitamins - usually at night but occasionally in am - + PPIs at night - not on Biotin  TFTs reviewed: Lab Results  Component Value Date   TSH 1.60 08/05/2021   TSH 0.63 05/21/2021   TSH 1.13 02/18/2021   TSH 4.00 11/03/2020   TSH 2.17 11/22/2016   She also has a history of a tiny thyroid nodule: 08/30/2007: Thyroid ultrasound: Findings: Thyroid gland is lower limits of  normal in size with the  right lobe measuring 3.3 cm long X 1.2 cm AP X 1.1 cm wide.  Left  lobe measures 3.9 cm long X 1.0 cm AP X 1.3 cm wide.  Isthmus  measures 2 mm AP.  Thyroid echotexture is homogeneous.  At the  medial mid left lobe the is cystic focus measuring 5 mm long X 3 mm  AP X 3 mm wide containing tiny anterior nonshadowing mural  echogenicity.     IMPRESSION:  1. Solitary 5 mm slightly complex cyst at the medial mid left lobe  thyroid favoring cystic adenoma.  2.  Otherwise, negative.    Pt denies: - feeling nodules in neck - hoarseness - dysphagia - choking  She has vitamin D deficiency: Latest vitamin D was at the upper limit of the target range: Lab Results  Component Value Date   VD25OH 98.09 08/05/2021   VD25OH 101 (H) 11/03/2020   VD25OH 69 11/22/2016  She was previously on ergocalciferol 50,000 units weekly.  At last visit I advised her to take vitamin D3 1000 units daily but she had ergocalciferol at home and switched to 50,000 units every 2 weeks.  A B12 level was normal: Lab Results  Component Value Date   VITAMINB12 911 11/03/2020   She also has a history of: ADHD-on Adderall Chronic pain syndrome Chronic fatigue Fibromyalgia-sees rheumatology -on methylphenidate patch for this and for ADD GERD HTN Back pain and shoulder pain-status post spinal surgery - Dr. Ellene Route Osteoarthritis Migraines Dry eyes-on Shirley Friar and Systain - possibly Sjogren  ROS: + see HPI  Past Medical History:  Diagnosis Date   Allergy    Arthritis of left acromioclavicular joint    Asthma    Blood transfusion without reported diagnosis    unsure when   Cervical disc disorder with left arm radiculopathy of cervical region 04/20/2017   frequent headaches   Chronic pain    Dr. Ellene Route follows   Colon polyp 11/09/2007   Hyperplastic    Depression    Diverticulosis of colon (without mention of hemorrhage) 11/09/2007   Dry eye    Esophageal reflux    Fibromyalgia     Hyperlipidemia    Hypertension    in the past   Hypothyroidism    Kyphoscoliosis    40 degree bend ro right side   Paroxysmal spells 02/13/2013   Patent foramen ovale    hx.- Dr. Lemmie Evens. Smith,cardiology- follows   Pre-diabetes    Controls with diet   Rotator cuff tear, non-traumatic, left    Shoulder impingement, left    Past Surgical History:  Procedure Laterality Date   ABDOMINAL HYSTERECTOMY     ANTERIOR LATERAL LUMBAR FUSION 4 LEVELS Left 06/16/2015   Procedure: ANTERIOR LATERAL LUMBAR FUSION THORACIC TWELVE-LUMBAR FOUR;  Surgeon: Mallie Mussel  Ellene Route, MD;  Location: [facility];  Service: Neurosurgery;  Laterality: Left;  Thoracolumbar spine   APPENDECTOMY     CESAREAN SECTION     x2   CHOLECYSTECTOMY     COLONOSCOPY  03/23/2020   dry eyes     uses Restasis daily   ESOPHAGOGASTRODUODENOSCOPY  02/23/2012   Procedure: ESOPHAGOGASTRODUODENOSCOPY (EGD);  Surgeon: Irene Shipper, MD;  Location: [facility];  Service: Endoscopy;  Laterality: N/A;   JOINT REPLACEMENT     RTKA '05   KNEE ARTHROSCOPY W/ ACL RECONSTRUCTION     Rt. Knee   POSTERIOR LUMBAR FUSION 4 LEVEL N/A 06/16/2015   Procedure: POSTERIOR LUMBAR FUSION LUMBAR FOUR-FIVE LUMBAR FIVE-SACRAL ONE;  Surgeon: Kristeen Miss, MD;  Location: [facility];  Service: Neurosurgery;  Laterality: N/A;   RADIOLOGY WITH ANESTHESIA N/A 06/15/2015   Procedure: RADIOLOGY WITH ANESTHESIA;  Surgeon: Medication Radiologist, MD;  Location: [facility] [facility];  Service: Radiology;  Laterality: N/A;   right rotator cuff  2011   sx   SHOULDER ARTHROSCOPY Left 04/28/2017   Procedure: ARTHROSCOPY SHOULDER WITH EXTENSIVE DEBRIDEMENT ROTATOR CUFF TEAR;  Surgeon: Elsie Saas, MD;  Location: [facility];  Service: Orthopedics;  Laterality: Left;   UPPER GASTROINTESTINAL ENDOSCOPY  03/23/2020   Social History   Socioeconomic History   Marital status: Married    Spouse name: Legrand Como   Number of children: Not on file   Years of education: Not on file    Highest education level: Not on file  Occupational History   Occupation: Facilities manager: [employer]  Tobacco Use   Smoking status: Never    Passive exposure: Yes   Smokeless tobacco: Never   Tobacco comments:    Flight attentent for 7 years. 1966-1973 Passive smoker  Vaping Use   Vaping Use: Never used  Substance and Sexual Activity   Alcohol use: Yes    Comment: Very rarely    Drug use: No   Sexual activity: Yes    Partners: Male  Other Topics Concern   Not on file  Social History Narrative   Husband is Dr. Tillman Sers       Right Handed    Lives in a three story home    Social Determinants of Health   Financial Resource Strain: Not on file  Food Insecurity: Not on file  Transportation Needs: Not on file  Physical Activity: Not on file  Stress: Not on file  Social Connections: Not on file  Intimate Partner Violence: Not on file   Current Outpatient Medications on File Prior to Visit  Medication Sig Dispense Refill   acetaminophen (TYLENOL) 650 MG CR tablet Take 650 mg by mouth every 8 (eight) hours as needed for pain.     albuterol (VENTOLIN HFA) 108 (90 Base) MCG/ACT inhaler Inhale 2 puffs into the lungs every 4 (four) hours as needed for wheezing 18 g 5   amoxicillin (AMOXIL) 500 MG capsule Take 4 capsules (2,000 mg total) by mouth as directed 1 hour prior to treatment 16 capsule 1   benzonatate (TESSALON) 100 MG capsule Take 1-2 capsules (100-200 mg total) by mouth 3 (three) times daily as needed for cough. 60 capsule 0   conjugated estrogens (PREMARIN) vaginal cream Place 0.625 mg vaginally daily as needed.      Continuous Blood Gluc Receiver (FREESTYLE LIBRE 2 READER) DEVI Use as instructed to check blood sugar 1 each 0   Continuous Blood Gluc Sensor (DEXCOM G7 SENSOR) MISC  Apply 1 sensor every 10 days 9 each 4   Continuous Blood Gluc Sensor (FREESTYLE LIBRE 2 SENSOR) MISC Inject into skin every 14 days as directed for glucose monitoring 4 each 12    Continuous Blood Gluc Sensor (FREESTYLE LIBRE 2 SENSOR) MISC Inject into the skin every 14 days. Use as directed for 14 days per sensor for glucose monitoring 12 each 4   Cyanocobalamin (B-12 PO) Take by mouth.     EPINEPHrine 0.3 mg/0.3 mL IJ SOAJ injection Use as directed for severe allergic reaction 2 each 6   estrogens, conjugated, (PREMARIN) 0.45 MG tablet TAKE 1 TABLET BY MOUTH ONCE DAILY. 90 tablet 3   estrogens, conjugated, (PREMARIN) 0.45 MG tablet Take 1 tablet (0.45 mg total) by mouth daily. 90 tablet 3   glucose blood test strip Use to test blood sugar 2 times daily 100 each 8   HYDROcodone bit-homatropine (HYCODAN) 5-1.5 MG/5ML syrup Take 5 mLs by mouth every 8 (eight) hours as needed for cough. (Patient taking differently: Take 5 mLs by mouth every 8 (eight) hours as needed for cough. PRN) 120 mL 0   hydrocortisone 2.5 % cream as needed.     icosapent Ethyl (VASCEPA) 1 g capsule Take 2 capsules (2 g total) by mouth 2 (two) times daily with a meal. 360 capsule 4   ketoconazole (NIZORAL) 2 % cream Apply 1 application topically 2 (two) times daily as needed for irritation.      Lancets (FREESTYLE) lancets Use to test blood sugar twice a day 200 each 4   LEXAPRO 20 MG tablet Take 1 tablet (20 mg total) by mouth daily. 90 tablet 1   LEXAPRO 20 MG tablet Take 1 tablet (20 mg total) by mouth daily. 90 tablet 0   Lifitegrast (XIIDRA) 5 % SOLN Place 1 drop into both eyes 2 (two) times daily. 900 each 4   loratadine (CLARITIN) 10 MG tablet Take 10 mg by mouth daily as needed for allergies.     meclizine (ANTIVERT) 25 MG tablet Take 25 mg by mouth 3 (three) times daily as needed for dizziness.     methylphenidate (DAYTRANA) 10 mg/9hr patch Place 1 patch (10 mg total) onto the skin daily in the afternoon as needed 30 patch 0   methylphenidate (DAYTRANA) 10 mg/9hr patch Place 1 patch (10 mg total) onto the skin daily in the afternoon as needed. 30 patch 0   methylphenidate (DAYTRANA) 10 mg/9hr  patch Place 1 patch (10 mg total) onto the skin daily in the afternoon as needed. 12/09/21 30 patch 0   methylphenidate (DAYTRANA) 10 mg/9hr patch Place 1 patch (10 mg total) onto the skin daily in the afternoon as needed. 01/06/22 30 patch 0   methylphenidate (DAYTRANA) 15 mg/9hr APPLY 1 PATCH TOPICALLY ON THE SKIN ONCE DAILY. DUE 08/30/20. 30 patch 0   methylphenidate (DAYTRANA) 15 mg/9hr Place 1 patch (15 mg total) onto the skin daily. DNF 06/03/21 30 patch 0   methylphenidate (DAYTRANA) 15 mg/9hr Place 1 patch (15 mg total) onto the skin daily. 30 patch 0   methylphenidate (DAYTRANA) 15 mg/9hr Place 1 patch (15 mg total) onto the skin daily. 30 patch 0   methylphenidate (DAYTRANA) 15 mg/9hr Place 1 patch (15 mg total) onto the skin daily. 30 patch 0   methylphenidate (DAYTRANA) 15 mg/9hr Place 1 patch (15 mg total) onto the skin daily. 11/11/21 30 patch 0   methylphenidate (DAYTRANA) 15 mg/9hr Place 1 patch (15 mg total) onto the skin daily.  12/09/21 30 patch 0   methylphenidate (DAYTRANA) 15 mg/9hr Place 1 patch (15 mg total) onto the skin daily. 01/06/22 30 patch 0   miconazole (MONISTAT 7) 2 % vaginal cream Use as needed     Multiple Vitamins-Minerals (CENTRUM SILVER PO) Take 1 tablet by mouth. CENTRUM SILVER GUMMY ONE A DAY     oxymetazoline (AFRIN) 0.05 % nasal spray Use 1-2 sprays each nostril every 12 hours as needed, for occasional use only     pantoprazole (PROTONIX) 40 MG tablet Take 1 tablet (40 mg total) by mouth daily. 90 tablet 3   Polyvinyl Alcohol-Povidone (REFRESH OP) Apply to eye.     pravastatin (PRAVACHOL) 40 MG tablet Take 1 tablet (40 mg total) by mouth daily. 90 tablet 3   pravastatin (PRAVACHOL) 40 MG tablet Take 1 tablet (40 mg total) by mouth daily for cholesterol 90 tablet 3   PREMARIN 0.45 MG tablet Take 0.45 mg by mouth daily.     Pyridoxine HCl (B-6 PO) Take by mouth.     silver sulfADIAZINE (SILVADENE) 1 % cream APPLY TO THE AFFECTED AREA(S) AS NEEDED FOR BURN AS  DIRECTED 25 g 2   SYNTHROID 50 MCG tablet Take 1 tablet (50 mcg total) by mouth daily before breakfast. 90 tablet 3   testosterone (ANDROGEL) 50 MG/5GM (1%) GEL Use as directed 3 times a week as needed     traZODone (DESYREL) 50 MG tablet TAKE 1-2 TABLETS BY MOUTH AT BEDTIME AS NEEDED FOR SLEEP 180 tablet 1   traZODone (DESYREL) 50 MG tablet Take 1-2 tablets (50-100 mg total) by mouth at bedtime as needed for sleep 180 tablet 1   traZODone (DESYREL) 50 MG tablet Take 1-2 tablets (50-100 mg total) by mouth at bedtime as needed for sleep 180 tablet 1   traZODone (DESYREL) 50 MG tablet Take 1-2 tablets (50-100 mg total) by mouth at bedtime as needed for sleep. 180 tablet 1   traZODone (DESYREL) 50 MG tablet Take 1-2 tablets (50-100 mg total) by mouth at bedtime as needed for sleep 180 tablet 1   traZODone (DESYREL) 50 MG tablet Take 1-2 tablets (50-100 mg total) by mouth at bedtime as needed for sleep 180 tablet 0   triamcinolone (KENALOG) 0.1 % Apply topically in the morning and at bedtime.     Vitamin D, Ergocalciferol, (DRISDOL) 50000 units CAPS capsule Take 50,000 Units by mouth every 14 (fourteen) days.     No current facility-administered medications on file prior to visit.   Allergies  Allergen Reactions   Betadine [Povidone Iodine] Other (See Comments)    Blister hands and feet     Fentanyl Shortness Of Breath    Pt has tolerated this medication 06/2015 pre and during operation. MD ok giving.  Low dose only   Morphine And Related Anaphylaxis    respiratory depression    Bupropion Hcl     Dizzy, fog   Codeine Nausea And Vomiting   Demerol [Meperidine] Nausea And Vomiting   Dextromethorphan Other (See Comments)    Unknown    Guaifenesin & Derivatives     Other, unknown   Lipitor [Atorvastatin]     Myalgia   Lovaza [Omega-3-Acid Ethyl Esters] Diarrhea   Lyrica [Pregabalin]     Fatigue and depression   Naltrexone     depression   Percodan [Oxycodone-Aspirin] Nausea And Vomiting    Polyethylene Glycol     Blurred vision, headache, and causes bright red skin irritation if on skin.   Prilosec [  Omeprazole]     Iliacs     Propylene Glycol    Topamax [Topiramate]     disorientation    Valium [Diazepam]     Small doses are OK but large doses are not. Long-term use causes depression.    Versed [Midazolam]     In moderate in large doses shortness of breath, and respiratory depression    Adhesive [Tape] Rash    Skin irritation    Concerta [Methylphenidate] Palpitations   Doxycycline Rash    Pruritic rash   Lamotrigine Rash   Nickel Rash    Skin irritation    Oxycodone-Acetaminophen Nausea And Vomiting and Rash    depression   Percocet [Oxycodone-Acetaminophen] Nausea And Vomiting and Rash   Family History  Problem Relation Age of Onset   Ovarian cancer Mother        passed at age 58   Hypertension Mother    Heart disease Father    Kidney disease Father        Kidney cancer   Diabetes type II Father        passed at 59 yrs old   Colon polyps Father    Myasthenia gravis Father        passed from [place] gravis   Bipolar disorder Sister        Osteopenia   Bipolar disorder Son    Esophageal cancer Neg Hx    Rectal cancer Neg Hx    Stomach cancer Neg Hx    Colon cancer Neg Hx    PE: There were no vitals filed for this visit.  Wt Readings from Last 3 Encounters:  08/05/21 179 lb 9.6 oz (81.5 kg)  06/23/21 182 lb (82.6 kg)  05/21/21 174 lb 6.4 oz (79.1 kg)    Constitutional: overweight, in NAD Eyes:  EOMI, no exophthalmos ENT: no neck masses, no cervical lymphadenopathy Cardiovascular: RRR, No MRG, + mild periankle edema bilaterally Respiratory: CTA B Musculoskeletal: no deformities Skin:no rashes Neurological: no tremor with outstretched hands  ASSESSMENT: 1.  Prediabetes   2.  Idiopathic postprandial syndrome   3.  Hyperlipidemia  4.  Hashimoto's hypothyroidism  5.  High Vitamin D    PLAN:  1. Patient with Inc. glucose tolerance  is 2002, with HbA1c levels in the prediabetic range.  At last visit, HbA1c was 6.0%, excellent.  Her highest HbA1c obtained so far was 6.2%. -She tried metformin before but she stopped due to presumed hypoglycemic episodes.  She started a CGM in 2021 and she mentioned that this was an eye opener and demonstrated that her episodes of hypoglycemic symptoms were not, in fact, related to low blood sugars.  When she saw Dr. Elyse Hsu for the last time in 08/2020, she was advised to resume the CGM.  She is paying out-of-pocket for this.  I gave her a Dexcom G7 sample at last visit and advised her to let me know if she wanted me to send a prescription for this. -At last visit, reviewing her CGM traces, her blood sugars were at goal, with minimal fluctuations.  Lowest blood sugar was 65.  We are managing her without medications. CGM interpretation: -At today's visit, we reviewed her CGM downloads: It appears that 99% of values are in target range (goal >70%), while 1% are higher than 180 (goal <25%), and 0% are lower than 70 (goal <4%).  The calculated average blood sugar is 121.  The projected HbA1c for the next 3 months (GMI) is 6.2%. -Reviewing the CGM  trends, excellent throughout the day.  She had only one instance in the last 2 weeks when her blood sugars after lunch increase to 209, and then improved to baseline. - no medication needed for now. - Our target for her would be: 80-100 mg/dL before meals and less than 140-150 mg/dL after meals along with an HbA1c of less than 6.5% ideally - at today's visit, HbA1c is 6.0% (excellent) - continue to check her blood sugars throughout the day with her CGM - UTD with eye exams - return to clinic in 2 months  2.  Idiopathic postprandial syndrome -Associated with spells with weakness, dizziness, burning sensation in her stomach, resolved after taking glucose -However, these episodes are not associated with low blood sugars.  She occasionally has dizzy spells  occurring when her blood sugars are at 90, and she eats to prevent this to happen.  She may have neurologic symptoms when her sugars drop under 70.  We discussed that since these are normal sugars, it appears that she may have just an increase sensitivity to the fluctuations in the blood sugars.  Of note, she saw neurology in the past and no etiology was found for these episodes. MRI was normal.   -No medication is available for this condition, however, we discussed about dietary changes that she can institute to prevent large fluctuations in blood sugars: Eating low glycemic index foods, mostly focusing on solid foods, rather than semisolid or liquid, no  liquids at the time of the meal but separate them by at least 30 minutes from the meal, and start the meal with protein and fat and end with carbs.  -She already has glucose tablets at hand and takes 1-2 when she feels that her sugars are dropping -We did discuss that her freestyle libre CGM may indicate blood sugars that are 10 to 20 mg/dL lower than actually measured by her glucometer.  Therefore, whenever she suspects a low, she needs to check with her glucometer to make sure that these are real levels. 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  Hyperlipidemia -Reviewed  latest lipid panel from 10/2020: Triglycerides high, LDL slightly above target and HDL at goal: Lab Results  Component Value Date   CHOL 172 11/03/2020   HDL 67 11/03/2020   LDLCALC 75 11/03/2020   TRIG 201 (H) 11/03/2020   CHOLHDL 2.6 11/03/2020  -She continues on pravastatin 40 mg daily and Vascepa 2 g twice a day, started after the above results returned.  She tolerates this well.  4.  Hashimoto's hypothyroidism - latest thyroid labs reviewed with pt. >> normal: Lab Results  Component Value Date   TSH 1.60 08/05/2021  - she continues on LT4 50 mcg daily - pt feels good on this dose. - we discussed about taking the thyroid hormone every day, with water, >30  minutes before breakfast, separated by >4 hours from acid reflux medications, calcium, iron, multivitamins. Pt. is taking it correctly.  At last visit, she was not taking it correctly, she was occasionally taking multivitamins in the morning.  I advised her to move these more than 4 hours later. - will check thyroid tests today: TSH and fT4 - If labs are abnormal, she will need to return for repeat TFTs in 1.5 months  5.  High vitamin D level -She has a history of vitamin D higher than 100 10/2020.   -Last visit, vitamin D level was still high at 98.  I advised her  to switch to 1000 units vitamin D3 daily and return for repeat in 3 months -She did not return for repeat labs but will recheck the level today  -She mentions that she is actually taking 50,000 units of ergocalciferol every 2 weeks now as she had a lot of ergocalciferol at home -Latest TSH reviewed: Lab Results  Component Value Date   VD25OH 98.09 08/05/2021   Needs 90 day supply of vit D and LT4 and CGM.  Component     Latest Ref Rng 01/07/2022  Hemoglobin A1C     4.0 - 5.6 % 6.0 !   TSH     0.35 - 5.50 uIU/mL 3.88   T4,Free(Direct)     0.60 - 1.60 ng/dL 0.91   VITD     ng/mL >120     Critical value reported for the vitamin D.  We got in touch with the patient.  Vitamin D completely and have her back in 3 mo for a repeat level. TFTs are normal.  Philemon Kingdom, MD PhD [facility]

## 2022-01-12 DIAGNOSIS — W19XXXA Unspecified fall, initial encounter: Secondary | ICD-10-CM | POA: Diagnosis not present

## 2022-01-12 DIAGNOSIS — R42 Dizziness and giddiness: Secondary | ICD-10-CM | POA: Diagnosis not present

## 2022-01-12 DIAGNOSIS — M542 Cervicalgia: Secondary | ICD-10-CM | POA: Diagnosis not present

## 2022-01-19 ENCOUNTER — Encounter (HOSPITAL_BASED_OUTPATIENT_CLINIC_OR_DEPARTMENT_OTHER): Payer: Self-pay | Admitting: Surgery

## 2022-01-21 ENCOUNTER — Telehealth: Payer: Self-pay | Admitting: Internal Medicine

## 2022-01-21 ENCOUNTER — Other Ambulatory Visit (HOSPITAL_COMMUNITY): Payer: Self-pay

## 2022-01-21 NOTE — Telephone Encounter (Signed)
Rita Gregory [phone]  [name] called to say she wanted a sick visit and a physical by the endo of the month. I let her know she could not have both. We have no more physicals until Feb 2024. We could do a sick visit, but she could not tell me what was wrong, then she proceeded to tell me she wanted all of her medication filled for 90 days, but could not tell me which ones, and she wants a bunch of labs done before her insurance runs out at the end of the month, she will ask her husband what she wants. I let her know if she needs refills she needs to contact pharmacy because it looks like the prescriptions you have wrote has plenty of refills until 08/2022. I scheduled her to come in on Monday at 3:30 because she is having surgery on Thursday 01/27/22.

## 2022-01-21 NOTE — Telephone Encounter (Signed)
I called and spoke with Manuela Schwartz at [facility], she does not need any refills of the prescriptions sent to pharmacy until May / July 2024, and they are already on 90 days

## 2022-01-23 ENCOUNTER — Encounter (HOSPITAL_BASED_OUTPATIENT_CLINIC_OR_DEPARTMENT_OTHER): Payer: Self-pay | Admitting: Surgery

## 2022-01-23 DIAGNOSIS — K439 Ventral hernia without obstruction or gangrene: Secondary | ICD-10-CM | POA: Diagnosis present

## 2022-01-23 NOTE — H&P (Signed)
REFERRING PHYSICIAN: Elby Showers, MD  PROVIDER: Sutter Ahlgren Charlotta Newton, MD   Chief Complaint: New Consultation (Ventral / epigastric hernia)  History of Present Illness:  Patient is for by Dr. Tommie Ard [facility] for surgical evaluation and management of a soft tissue mass in the epigastrium. This was first noted approximately 1-1/2 to 2 years ago. It has gradually enlarged. It has become mildly symptomatic. Patient has had no upper abdominal surgical procedures. She has had no prior hernia repairs. She does have gastroesophageal reflux. She has undergone upper endoscopy and colonoscopy by Dr. Henrene Pastor. Patient has had no imaging studies other than a recent chest x-ray. Patient presents today accompanied by her husband for evaluation. 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  Review of Systems: A complete review of systems was obtained from the patient. I have reviewed this information and discussed as appropriate with the patient. See HPI as well for other ROS.  Review of Systems  Constitutional: Negative.  HENT: Negative.  Eyes: Negative.  Respiratory: Negative.  Cardiovascular: Negative.  Gastrointestinal: Positive for abdominal pain and heartburn.  Genitourinary: Negative.  Musculoskeletal: Negative.  Skin: Negative.  Neurological: Negative.  Endo/Heme/Allergies: Negative.  Psychiatric/Behavioral: Negative.   Medical History: Past Medical History:  Diagnosis Date  Anxiety  Arrhythmia  Arthritis  GERD (gastroesophageal reflux disease)  Hyperlipidemia  Thyroid disease   Patient Active Problem List  Diagnosis  Epigastric hernia   Past Surgical History:  Procedure Laterality Date  csection surgery N/A  lumbar fusion surgery N/A  right acl repair surgery Right  rotator cuff surgery Bilateral    Not on File  Current Outpatient Medications on File Prior to Visit  Medication Sig Dispense Refill  icosapent ethyL (VASCEPA) 1 gram capsule Take by  mouth  pantoprazole (PROTONIX) 40 MG DR tablet Take 40 mg by mouth once daily  SYNTHROID 50 mcg tablet Take by mouth  traZODone (DESYREL) 50 MG tablet 1/2 tablet  XIIDRA 5 % ophthalmic solution Apply 1 drop to eye 2 (two) times daily  conjugated estrogens (PREMARIN) 0.625 mg/gram vaginal cream Place vaginally  escitalopram oxalate (LEXAPRO) 10 MG tablet 1 tablet  gabapentin (NEURONTIN) 100 MG capsule  lansoprazole (PREVACID) 30 MG DR capsule Take 1 capsule once a day as needed (instead of Nexium)  loratadine (CLARITIN) 10 mg tablet Take by mouth  multivit-min-iron-folic-vit K1 (CENTRUM CHEWABLES) 8 mg-400 mcg- 10 mcg Chew  olopatadine (PATANOL) 0.1 % ophthalmic solution 1 drop 2 times daily.  oxymetazoline (AFRIN) 0.05 % nasal spray Use 1-2 sprays each nostril every 12 hours as needed, for occasional use only  pyridoxine, vitamin B6, (B-6) 100 MG tablet Take 1 tablet by mouth once daily  testosterone (TESTIM) 50 mg/5 gram (1 %) gel Use as directed 3 times a week as needed   No current facility-administered medications on file prior to visit.   Family History  Problem Relation Age of Onset  Obesity Mother  High blood pressure (Hypertension) Mother  Heart valve disease Mother  Stroke Father  Diabetes Father  Heart valve disease Father  Obesity Brother  Obesity Son    Social History   Tobacco Use  Smoking Status Never  Smokeless Tobacco Never    Social History   Socioeconomic History  Marital status: Unknown  Tobacco Use  Smoking status: Never  Smokeless tobacco: Never  Vaping Use  Vaping Use: Never used  Substance and Sexual Activity  Alcohol use: Yes  Drug use: Never   Objective:  Vitals:  BP: 102/70  Pulse: 77  Temp: 36.3 C (97.3 F)  SpO2: 96%  Weight: 80.9 kg (178 lb 6.4 oz)  Height: 151.1 cm (4' 11.5")   Body mass index is 35.43 kg/m.  Physical Exam   GENERAL APPEARANCE Comfortable, no acute issues Development: normal Gross deformities:  none  SKIN Rash, lesions, ulcers: none Induration, erythema: none Nodules: none palpable  EYES Conjunctiva and lids: normal Pupils: equal and reactive  EARS, NOSE, MOUTH, THROAT External ears: no lesion or deformity External nose: no lesion or deformity Hearing: grossly normal  NECK Symmetric: yes Trachea: midline Thyroid: no palpable nodules in the thyroid bed  CHEST Respiratory effort: normal Retraction or accessory muscle use: no Breath sounds: normal bilaterally Rales, rhonchi, wheeze: none  CARDIOVASCULAR Auscultation: regular rhythm, normal rate Murmurs: none Pulses: radial pulse 2+ palpable Lower extremity edema: none  ABDOMEN In the upper abdomen there is a slight visible bulge. On palpation there is a soft tissue mass in the mid to upper epigastrium slightly to the right of the midline. With Valsalva there is clearly protrusion of the hernia with mild discomfort. It is largely reducible. Fascial defect is probably 1 to 2 cm in diameter.  GENITOURINARY/RECTAL Not assessed  MUSCULOSKELETAL Station and gait: normal Digits and nails: no clubbing or cyanosis Muscle strength: grossly normal all extremities Range of motion: grossly normal all extremities Deformity: none  LYMPHATIC Cervical: none palpable Supraclavicular: none palpable  PSYCHIATRIC Oriented to person, place, and time: yes Mood and affect: normal for situation Judgment and insight: appropriate for situation   Assessment and Plan:   Epigastric hernia  Patient is referred by her primary care physician for surgical evaluation and management of a newly diagnosed ventral hernia. Patient is provided with written literature on hernia surgery to review at home.  Patient has a ventral hernia in the epigastrium. Today we discussed operative repair. I would recommend doing this by open technique using a small mesh patch. We discussed the mesh that is employed and the materials involved. We discussed  using Exparel as local anesthetic for postoperative pain control. We discussed the numerous allergies that the patient has to pain medication and believe that she would be able to take tramadol as needed for postoperative discomfort. We discussed restrictions on her activities. We discussed the risk of recurrence. Patient understands and wishes to proceed with surgery in the near future.  Armandina Gemma, MD [facility] [place] practice Office: [phone]

## 2022-01-24 ENCOUNTER — Other Ambulatory Visit: Payer: Self-pay

## 2022-01-24 ENCOUNTER — Encounter: Payer: Self-pay | Admitting: Internal Medicine

## 2022-01-24 ENCOUNTER — Encounter (HOSPITAL_BASED_OUTPATIENT_CLINIC_OR_DEPARTMENT_OTHER): Payer: Self-pay | Admitting: Surgery

## 2022-01-24 ENCOUNTER — Ambulatory Visit (INDEPENDENT_AMBULATORY_CARE_PROVIDER_SITE_OTHER): Payer: 59 | Admitting: Internal Medicine

## 2022-01-24 VITALS — BP 94/60 | HR 76 | Temp 97.7°F | Ht 59.5 in | Wt 180.0 lb

## 2022-01-24 DIAGNOSIS — L304 Erythema intertrigo: Secondary | ICD-10-CM

## 2022-01-24 MED ORDER — KETOCONAZOLE 2 % EX CREA: 1.0000 | TOPICAL_CREAM | Freq: Two times a day (BID) | CUTANEOUS | 3 refills | Status: DC | PRN

## 2022-01-24 MED ORDER — ALPRAZOLAM 0.5 MG PO TABS: ORAL_TABLET | ORAL | 0 refills | Status: AC

## 2022-01-24 NOTE — Progress Notes (Signed)
Spoke w/ via phone for pre-op interview--- pt Lab needs dos----  no             Lab results------ no COVID test -----patient states asymptomatic no test needed Arrive at ------- 0630 on 01-27-2022 NPO after MN NO Solid Food.  Clear liquids from MN until--- 0530 Med rec completed Medications to take morning of surgery ----- synthroid, lexapro Diabetic medication ----- n/a Patient instructed no nail polish to be worn day of surgery Patient instructed to bring photo id and insurance card day of surgery Patient aware to have Driver (ride ) / caregiver for 24 hours after surgery --  husband, micheal Patient Special Instructions ----- n/a Pre-Op special Istructions ----- n/a Patient verbalized understanding of instructions that were given at this phone interview. Patient denies shortness of breath, chest pain, fever, cough at this phone interview.

## 2022-01-24 NOTE — Patient Instructions (Signed)
Try Xanax for vertigo 0.5 mg twice daily as needed.  Try Nizoral cream for intertrigo.  Let me know if symptoms not improving.

## 2022-01-24 NOTE — Progress Notes (Signed)
   Subjective:    Patient ID: Rita Gregory, female    DOB: Jan 03, 1946, 76 y.o.   MRN: [id number]  HPI Mrs. Rita Gregory is accompanied by her husband, Dr. Tobe Sos today.  He will begin working soon at [employer] in [place].  Dr. Harlow Asa plans surgery on October 19 at [facility] to repair epigastric hernia.  She has been having some issues with a rash under her breasts.  This is consistent with intertrigo.  He has been having some issues with vertigo.  Was seen at [facility] in [facility] recently.  Apparently had 1 month of daily vertigo with room spinning and some headaches.  She does have a history of migraine headaches.  Has been diagnosed with cervicalgia.  Meclizine helped some.  Dr. Ellene Route did MRI of the C-spine in October 2022.  She had C3-C4 moderate right neural foraminal narrowing which appeared to be some similar to a prior exam.  Had C4-C5 moderate left neural foraminal narrowing which appeared to be similar to prior exam.  She had MRI of the brain in 2020 showing mild cortical atrophy and chronic microvascular ischemic changes typical for age.    Review of Systems see Rita Gregory is having some issues with vertigo.     Objective:   Physical Exam Vital signs are reviewed.  She has no facial weakness.  Cranial nerves II through XII are grossly intact.  I do not appreciate nystagmus today.  She has intertrigo under her breasts and in her groin areas.       Assessment & Plan:   I think she has benign positional vertigo that is currently related to stress.  Her husband is changing jobs.  She is having surgery later this week.  I she would respond to Xanax 0.5 mg twice daily as needed for vertigo.  I think this would work better than meclizine.  If she is not improving with vertigo we can refer her back to neurology.  Intertrigo-treat with Nizoral cream daily.  She will let me know if not improving.

## 2022-01-25 ENCOUNTER — Other Ambulatory Visit (INDEPENDENT_AMBULATORY_CARE_PROVIDER_SITE_OTHER): Payer: 59

## 2022-01-25 ENCOUNTER — Ambulatory Visit: Payer: 59 | Admitting: Internal Medicine

## 2022-01-25 DIAGNOSIS — R7302 Impaired glucose tolerance (oral): Secondary | ICD-10-CM | POA: Diagnosis not present

## 2022-01-25 DIAGNOSIS — E782 Mixed hyperlipidemia: Secondary | ICD-10-CM

## 2022-01-25 DIAGNOSIS — Z8639 Personal history of other endocrine, nutritional and metabolic disease: Secondary | ICD-10-CM

## 2022-01-25 DIAGNOSIS — I10 Essential (primary) hypertension: Secondary | ICD-10-CM | POA: Diagnosis not present

## 2022-01-25 DIAGNOSIS — E039 Hypothyroidism, unspecified: Secondary | ICD-10-CM | POA: Diagnosis not present

## 2022-01-25 LAB — POCT URINALYSIS DIPSTICK
Bilirubin, UA: NEGATIVE
Blood, UA: NEGATIVE
Glucose, UA: NEGATIVE
Ketones, UA: NEGATIVE
Leukocytes, UA: NEGATIVE
Nitrite, UA: NEGATIVE
Protein, UA: NEGATIVE
Spec Grav, UA: 1.015 (ref 1.010–1.025)
Urobilinogen, UA: 0.2 E.U./dL
pH, UA: 5 (ref 5.0–8.0)

## 2022-01-25 NOTE — Addendum Note (Signed)
Addended by: Geradine Girt D on: 01/25/2022 10:21 AM   Modules accepted: Orders

## 2022-01-25 NOTE — Addendum Note (Signed)
Addended by: Geradine Girt D on: 01/25/2022 10:45 AM   Modules accepted: Orders

## 2022-01-26 LAB — LIPID PANEL
Cholesterol: 162 mg/dL (ref <200)
HDL: 56 mg/dL (ref >=50)
LDL Cholesterol (Calc): 73 mg/dL (calc)
Non-HDL Cholesterol (Calc): 106 mg/dL (calc) (ref <130)
Total CHOL/HDL Ratio: 2.9 (calc) (ref <5.0)
Triglycerides: 252 mg/dL — ABNORMAL HIGH (ref <150)

## 2022-01-26 LAB — COMPLETE METABOLIC PANEL WITH GFR
AG Ratio: 1.5 (calc) (ref 1.0–2.5)
ALT: 11 U/L (ref 6–29)
AST: 19 U/L (ref 10–35)
Albumin: 4 g/dL (ref 3.6–5.1)
Alkaline phosphatase (APISO): 80 U/L (ref 37–153)
BUN: 18 mg/dL (ref 7–25)
CO2: 28 mmol/L (ref 20–32)
Calcium: 9.3 mg/dL (ref 8.6–10.4)
Chloride: 103 mmol/L (ref 98–110)
Creat: 0.73 mg/dL (ref 0.60–1.00)
Globulin: 2.7 g/dL (calc) (ref 1.9–3.7)
Glucose, Bld: 110 mg/dL — ABNORMAL HIGH (ref 65–99)
Potassium: 4.4 mmol/L (ref 3.5–5.3)
Sodium: 140 mmol/L (ref 135–146)
Total Bilirubin: 0.3 mg/dL (ref 0.2–1.2)
Total Protein: 6.7 g/dL (ref 6.1–8.1)
eGFR: 85 mL/min/{1.73_m2} (ref >=60)

## 2022-01-26 LAB — CBC WITH DIFFERENTIAL/PLATELET
Absolute Monocytes: 435 cells/uL (ref 200–950)
Basophils Absolute: 48 cells/uL (ref 0–200)
Basophils Relative: 0.7 %
Eosinophils Absolute: 152 cells/uL (ref 15–500)
Eosinophils Relative: 2.2 %
HCT: 40.2 % (ref 35.0–45.0)
Hemoglobin: 13.3 g/dL (ref 11.7–15.5)
Lymphs Abs: 2615 cells/uL (ref 850–3900)
MCH: 29.6 pg (ref 27.0–33.0)
MCHC: 33.1 g/dL (ref 32.0–36.0)
MCV: 89.3 fL (ref 80.0–100.0)
MPV: 10 fL (ref 7.5–12.5)
Monocytes Relative: 6.3 %
Neutro Abs: 3650 cells/uL (ref 1500–7800)
Neutrophils Relative %: 52.9 %
Platelets: 268 10*3/uL (ref 140–400)
RBC: 4.5 10*6/uL (ref 3.80–5.10)
RDW: 12.9 % (ref 11.0–15.0)
Total Lymphocyte: 37.9 %
WBC: 6.9 10*3/uL (ref 3.8–10.8)

## 2022-01-26 LAB — PTH, INTACT AND CALCIUM
Calcium: 9.3 mg/dL (ref 8.6–10.4)
PTH: 16 pg/mL (ref 16–77)

## 2022-01-26 LAB — MICROALBUMIN / CREATININE URINE RATIO
Creatinine, Urine: 163 mg/dL (ref 20–275)
Microalb Creat Ratio: 17 mcg/mg creat (ref <30)
Microalb, Ur: 2.8 mg/dL

## 2022-01-27 ENCOUNTER — Encounter (HOSPITAL_BASED_OUTPATIENT_CLINIC_OR_DEPARTMENT_OTHER): Payer: Self-pay | Admitting: Surgery

## 2022-01-27 ENCOUNTER — Ambulatory Visit (HOSPITAL_BASED_OUTPATIENT_CLINIC_OR_DEPARTMENT_OTHER)
Admission: RE | Admit: 2022-01-27 | Discharge: 2022-01-27 | Disposition: A | Discharge status: Home / Self Care | Payer: 59 | Attending: Surgery | Admitting: Surgery

## 2022-01-27 ENCOUNTER — Encounter (HOSPITAL_BASED_OUTPATIENT_CLINIC_OR_DEPARTMENT_OTHER)
Admission: RE | Disposition: A | Discharge status: Home / Self Care | Payer: Self-pay | Source: Home / Self Care | Attending: Surgery

## 2022-01-27 ENCOUNTER — Other Ambulatory Visit: Payer: Self-pay

## 2022-01-27 ENCOUNTER — Ambulatory Visit (HOSPITAL_BASED_OUTPATIENT_CLINIC_OR_DEPARTMENT_OTHER): Payer: 59 | Admitting: Anesthesiology

## 2022-01-27 DIAGNOSIS — E039 Hypothyroidism, unspecified: Secondary | ICD-10-CM | POA: Diagnosis not present

## 2022-01-27 DIAGNOSIS — K219 Gastro-esophageal reflux disease without esophagitis: Secondary | ICD-10-CM | POA: Insufficient documentation

## 2022-01-27 DIAGNOSIS — Z6835 Body mass index (BMI) 35.0-35.9, adult: Secondary | ICD-10-CM | POA: Diagnosis not present

## 2022-01-27 DIAGNOSIS — M1990 Unspecified osteoarthritis, unspecified site: Secondary | ICD-10-CM | POA: Insufficient documentation

## 2022-01-27 DIAGNOSIS — K436 Other and unspecified ventral hernia with obstruction, without gangrene: Secondary | ICD-10-CM | POA: Insufficient documentation

## 2022-01-27 DIAGNOSIS — K439 Ventral hernia without obstruction or gangrene: Secondary | ICD-10-CM | POA: Diagnosis present

## 2022-01-27 DIAGNOSIS — M797 Fibromyalgia: Secondary | ICD-10-CM | POA: Insufficient documentation

## 2022-01-27 DIAGNOSIS — E669 Obesity, unspecified: Secondary | ICD-10-CM | POA: Insufficient documentation

## 2022-01-27 DIAGNOSIS — F418 Other specified anxiety disorders: Secondary | ICD-10-CM | POA: Diagnosis not present

## 2022-01-27 DIAGNOSIS — Z01818 Encounter for other preprocedural examination: Secondary | ICD-10-CM

## 2022-01-27 HISTORY — DX: Personal history of other diseases of the circulatory system: Z86.79

## 2022-01-27 HISTORY — DX: Rash and other nonspecific skin eruption: R21

## 2022-01-27 HISTORY — DX: Gastro-esophageal reflux disease without esophagitis: K21.9

## 2022-01-27 HISTORY — DX: Congenital malformation of cardiac septum, unspecified: Q21.9

## 2022-01-27 HISTORY — DX: Major depressive disorder, single episode, unspecified: F32.9

## 2022-01-27 HISTORY — DX: Other hypoglycemia: E16.1

## 2022-01-27 HISTORY — DX: Personal history of peptic ulcer disease: Z87.11

## 2022-01-27 HISTORY — DX: Other complications of anesthesia, initial encounter: T88.59XA

## 2022-01-27 HISTORY — DX: Dry eye syndrome of bilateral lacrimal glands: H04.123

## 2022-01-27 HISTORY — DX: Unspecified asthma, uncomplicated: J45.909

## 2022-01-27 HISTORY — DX: Ventral hernia without obstruction or gangrene: K43.9

## 2022-01-27 HISTORY — DX: Presence of spectacles and contact lenses: Z97.3

## 2022-01-27 HISTORY — DX: Generalized anxiety disorder: F41.1

## 2022-01-27 HISTORY — DX: Other allergic rhinitis: J30.89

## 2022-01-27 HISTORY — DX: Other migraine, not intractable, without status migrainosus: G43.809

## 2022-01-27 HISTORY — DX: Autoimmune thyroiditis: E06.3

## 2022-01-27 HISTORY — DX: Other specified behavioral and emotional disorders with onset usually occurring in childhood and adolescence: F98.8

## 2022-01-27 HISTORY — PX: EPIGASTRIC HERNIA REPAIR: SHX404

## 2022-01-27 LAB — GLUCOSE, CAPILLARY: Glucose-Capillary: 98 mg/dL (ref 70–99)

## 2022-01-27 SURGERY — REPAIR, HERNIA, EPIGASTRIC, ADULT
Anesthesia: General | Site: Abdomen

## 2022-01-27 MED ORDER — TRAMADOL HCL 50 MG PO TABS: 50.0000 mg | ORAL_TABLET | Freq: Four times a day (QID) | ORAL | 0 refills | Status: DC | PRN

## 2022-01-27 MED ORDER — LIDOCAINE 2% (20 MG/ML) 5 ML SYRINGE
INTRAMUSCULAR | Status: DC | PRN
  Administered 2022-01-27: 30 mg via INTRAVENOUS

## 2022-01-27 MED ORDER — CHLORHEXIDINE GLUCONATE CLOTH 2 % EX PADS: 6.0000 | MEDICATED_PAD | Freq: Once | CUTANEOUS | Status: DC

## 2022-01-27 MED ORDER — PROPOFOL 10 MG/ML IV BOLUS
INTRAVENOUS | Status: DC | PRN
  Administered 2022-01-27: 100 mg via INTRAVENOUS

## 2022-01-27 MED ORDER — FENTANYL CITRATE (PF) 100 MCG/2ML IJ SOLN
INTRAMUSCULAR | Status: AC
  Filled 2022-01-27: qty 2

## 2022-01-27 MED ORDER — 0.9 % SODIUM CHLORIDE (POUR BTL) OPTIME
TOPICAL | Status: DC | PRN
  Administered 2022-01-27: 500 mL

## 2022-01-27 MED ORDER — LIDOCAINE HCL (PF) 2 % IJ SOLN
INTRAMUSCULAR | Status: AC
  Filled 2022-01-27: qty 5

## 2022-01-27 MED ORDER — DEXAMETHASONE SODIUM PHOSPHATE 10 MG/ML IJ SOLN
INTRAMUSCULAR | Status: AC
  Filled 2022-01-27: qty 1

## 2022-01-27 MED ORDER — KETOROLAC TROMETHAMINE 15 MG/ML IJ SOLN: 15.0000 mg | Freq: Once | INTRAMUSCULAR | Status: DC | PRN

## 2022-01-27 MED ORDER — ONDANSETRON HCL 4 MG/2ML IJ SOLN
INTRAMUSCULAR | Status: AC
  Filled 2022-01-27: qty 2

## 2022-01-27 MED ORDER — ACETAMINOPHEN 500 MG PO TABS
500.0000 mg | ORAL_TABLET | Freq: Once | ORAL | Status: AC
  Administered 2022-01-27: 500 mg via ORAL

## 2022-01-27 MED ORDER — ACETAMINOPHEN 500 MG PO TABS
ORAL_TABLET | ORAL | Status: AC
  Filled 2022-01-27: qty 1

## 2022-01-27 MED ORDER — FENTANYL CITRATE (PF) 100 MCG/2ML IJ SOLN
INTRAMUSCULAR | Status: DC | PRN
  Administered 2022-01-27 (×2): 25 ug via INTRAVENOUS

## 2022-01-27 MED ORDER — ONDANSETRON HCL 4 MG/2ML IJ SOLN
INTRAMUSCULAR | Status: DC | PRN
  Administered 2022-01-27: 4 mg via INTRAVENOUS

## 2022-01-27 MED ORDER — AMISULPRIDE (ANTIEMETIC) 5 MG/2ML IV SOLN: 10.0000 mg | Freq: Once | INTRAVENOUS | Status: DC | PRN

## 2022-01-27 MED ORDER — LACTATED RINGERS IV SOLN: INTRAVENOUS | Status: DC

## 2022-01-27 MED ORDER — CEFAZOLIN SODIUM-DEXTROSE 2-4 GM/100ML-% IV SOLN
2.0000 g | INTRAVENOUS | Status: AC
  Administered 2022-01-27: 2 g via INTRAVENOUS

## 2022-01-27 MED ORDER — CEFAZOLIN SODIUM-DEXTROSE 2-4 GM/100ML-% IV SOLN
INTRAVENOUS | Status: AC
  Filled 2022-01-27: qty 100

## 2022-01-27 MED ORDER — PROPOFOL 10 MG/ML IV BOLUS
INTRAVENOUS | Status: AC
  Filled 2022-01-27: qty 20

## 2022-01-27 MED ORDER — BUPIVACAINE HCL (PF) 0.25 % IJ SOLN
INTRAMUSCULAR | Status: DC | PRN
  Administered 2022-01-27: 20 mL

## 2022-01-27 MED ORDER — FENTANYL CITRATE (PF) 100 MCG/2ML IJ SOLN
25.0000 ug | INTRAMUSCULAR | Status: DC | PRN
  Administered 2022-01-27 (×2): 25 ug via INTRAVENOUS

## 2022-01-27 MED ORDER — BUPIVACAINE LIPOSOME 1.3 % IJ SUSP: 20.0000 mL | Freq: Once | INTRAMUSCULAR | Status: DC

## 2022-01-27 MED ORDER — DEXAMETHASONE SODIUM PHOSPHATE 10 MG/ML IJ SOLN
INTRAMUSCULAR | Status: DC | PRN
  Administered 2022-01-27: 5 mg via INTRAVENOUS

## 2022-01-27 MED ORDER — ONDANSETRON HCL 4 MG/2ML IJ SOLN: 4.0000 mg | Freq: Once | INTRAMUSCULAR | Status: DC | PRN

## 2022-01-27 MED ORDER — ROCURONIUM BROMIDE 10 MG/ML (PF) SYRINGE
PREFILLED_SYRINGE | INTRAVENOUS | Status: AC
  Filled 2022-01-27: qty 10

## 2022-01-27 MED ORDER — ACETAMINOPHEN 500 MG PO TABS: 1000.0000 mg | ORAL_TABLET | Freq: Once | ORAL | Status: DC

## 2022-01-27 SURGICAL SUPPLY — 34 items
ADH SKN CLS APL DERMABOND .7 (GAUZE/BANDAGES/DRESSINGS) ×1
APL SKNCLS STERI-STRIP NONHPOA (GAUZE/BANDAGES/DRESSINGS)
BENZOIN TINCTURE PRP APPL 2/3 (GAUZE/BANDAGES/DRESSINGS) ×1 IMPLANT
BLADE SURG 15 STRL LF DISP TIS (BLADE) ×1 IMPLANT
BLADE SURG 15 STRL SS (BLADE) ×1
COVER BACK TABLE 60X90IN (DRAPES) ×1 IMPLANT
COVER MAYO STAND STRL (DRAPES) ×1 IMPLANT
DERMABOND ADVANCED .7 DNX12 (GAUZE/BANDAGES/DRESSINGS) IMPLANT
DRAPE LAPAROTOMY 100X72 PEDS (DRAPES) ×1 IMPLANT
DRAPE UTILITY XL STRL (DRAPES) ×1 IMPLANT
ELECT REM PT RETURN 9FT ADLT (ELECTROSURGICAL) ×1
ELECTRODE REM PT RTRN 9FT ADLT (ELECTROSURGICAL) ×1 IMPLANT
GLOVE BIO SURGEON STRL SZ7 (GLOVE) IMPLANT
GLOVE SURG ORTHO 8.0 STRL STRW (GLOVE) ×1 IMPLANT
GOWN STRL REUS W/ TWL XL LVL3 (GOWN DISPOSABLE) ×1 IMPLANT
GOWN STRL REUS W/TWL LRG LVL3 (GOWN DISPOSABLE) IMPLANT
GOWN STRL REUS W/TWL XL LVL3 (GOWN DISPOSABLE) ×1
KIT TURNOVER CYSTO (KITS) ×1 IMPLANT
MANIFOLD NEPTUNE II (INSTRUMENTS) IMPLANT
MESH VENTRALEX ST 1-7/10 CRC S (Mesh General) IMPLANT
NEEDLE HYPO 22GX1.5 SAFETY (NEEDLE) ×1 IMPLANT
NS IRRIG 500ML POUR BTL (IV SOLUTION) ×1 IMPLANT
PACK BASIN DAY SURGERY FS (CUSTOM PROCEDURE TRAY) ×1 IMPLANT
PENCIL SMOKE EVACUATOR (MISCELLANEOUS) ×1 IMPLANT
STAPLER VISISTAT 35W (STAPLE) IMPLANT
STRIP CLOSURE SKIN 1/2X4 (GAUZE/BANDAGES/DRESSINGS) ×1 IMPLANT
SUT MNCRL AB 4-0 PS2 18 (SUTURE) ×1 IMPLANT
SUT NOVA NAB DX-16 0-1 5-0 T12 (SUTURE) IMPLANT
SUT NOVA NAB GS-21 1 T12 (SUTURE) IMPLANT
SUT VIC AB 3-0 SH 18 (SUTURE) ×1 IMPLANT
SYR CONTROL 10ML LL (SYRINGE) ×1 IMPLANT
TOWEL OR 17X26 10 PK STRL BLUE (TOWEL DISPOSABLE) ×1 IMPLANT
TUBE CONNECTING 12X1/4 (SUCTIONS) ×1 IMPLANT
YANKAUER SUCT BULB TIP NO VENT (SUCTIONS) ×1 IMPLANT

## 2022-01-27 NOTE — Discharge Instructions (Addendum)
[facility]  HERNIA REPAIR POST OP INSTRUCTIONS  Always review your discharge instruction sheet given to you by the facility where your surgery was performed.  A  prescription for pain medication may be sent to your pharmacy on discharge.  Take your pain medication as prescribed.  If narcotic pain medicine is not needed, then you may take acetaminophen (Tylenol) or ibuprofen (Advil) as needed.  Take your usually prescribed medications unless otherwise directed.  If you need a refill on your pain medication, please contact your pharmacy.  They will contact our office to request authorization. Prescriptions will not be filled after 5:00 PM daily or on weekends.  You should follow a light diet the first 24 hours after arrival home, such as soup and crackers or toast.  Be sure to include plenty of fluids daily.  Resume your normal diet the day after surgery.  Most patients will experience some swelling and bruising around the surgical site.  Ice packs and reclining will help.  Swelling and bruising can take several days to resolve.   It is common to experience some constipation if taking pain medication after surgery.  Increasing fluid intake and taking a stool softener (such as Colace) will usually help or prevent this problem from occurring.  A mild laxative (Milk of Magnesia or Miralax) should be taken according to package directions if there is no bowel movement after 48 hours.  You will likely have Dermabond (topical glue) over your incisions.  This seals the incisions and allows you to bathe and shower at any time after your surgery.  Glue should remain in place for up to 10 days.  It may be removed after 10 days by pealing off the Dermabond material or using Vaseline or naval jelly to remove.  ACTIVITIES:  You may resume regular (light) daily activities beginning the next day - such as daily self-care, walking, climbing stairs - gradually increasing activities as tolerated.  You  may have sexual intercourse when it is comfortable.  Refrain from any heavy lifting or straining until approved by your doctor.  You may drive when you are no longer taking prescription pain medication, when you can comfortably wear a seatbelt, and when you can safely maneuver your car and apply the brakes.  You should see your doctor in the office for a follow-up appointment approximately 2-3 weeks after your surgery.  Make sure that you call for this appointment within a day or two after you arrive home to insure a convenient appointment time.  WHEN TO CALL YOUR DOCTOR: Fever greater than 101.0 Inability to urinate Persistent nausea and/or vomiting Extreme swelling or bruising Continued bleeding from incision Increased pain, redness, or drainage from the incision  The clinic staff is available to answer your questions during regular business hours.  Please don't hesitate to call and ask to speak to one of the nurses for clinical concerns.  If you have a medical emergency, go to the nearest emergency room or call 911.  A surgeon from [facility] is always on call for the hospital.   [facility] [address] [place], [place],   [address]  [phone] ? [phone] ? FAX [phone]     Post Anesthesia Home Care Instructions  Activity: Get plenty of rest for the remainder of the day. A responsible individual must stay with you for 24 hours following the procedure.  For the next 24 hours, DO NOT: -Drive a car -Paediatric nurse -Drink alcoholic beverages -  Take any medication unless instructed by your physician -Make any legal decisions or sign important papers.  Meals: Start with liquid foods such as gelatin or soup. Progress to regular foods as tolerated. Avoid greasy, spicy, heavy foods. If nausea and/or vomiting occur, drink only clear liquids until the nausea and/or vomiting subsides. Call your physician if vomiting  continues.  Special Instructions/Symptoms: Your throat may feel dry or sore from the anesthesia or the breathing tube placed in your throat during surgery. If this causes discomfort, gargle with warm salt water. The discomfort should disappear within 24 hours.  If you had a scopolamine patch placed behind your ear for the management of post- operative nausea and/or vomiting:  1. The medication in the patch is effective for 72 hours, after which it should be removed.  Wrap patch in a tissue and discard in the trash. Wash hands thoroughly with soap and water. 2. You may remove the patch earlier than 72 hours if you experience unpleasant side effects which may include dry mouth, dizziness or visual disturbances. 3. Avoid touching the patch. Wash your hands with soap and water after contact with the patch.     No acetaminophen/Tylenol until after 1:30 pm today if needed.

## 2022-01-27 NOTE — Anesthesia Procedure Notes (Signed)
Procedure Name: LMA Insertion Date/Time: 01/27/2022 8:44 AM  Performed by: Suan Halter, CRNAPre-anesthesia Checklist: Patient identified, Emergency Drugs available, Suction available and Patient being monitored Patient Re-evaluated:Patient Re-evaluated prior to induction Oxygen Delivery Method: Circle system utilized Preoxygenation: Pre-oxygenation with 100% oxygen Induction Type: IV induction Ventilation: Mask ventilation without difficulty LMA: LMA inserted LMA Size: 4.0 Number of attempts: 1 Airway Equipment and Method: Bite block Placement Confirmation: positive ETCO2 Tube secured with: Tape Dental Injury: Teeth and Oropharynx as per pre-operative assessment

## 2022-01-27 NOTE — Transfer of Care (Signed)
Immediate Anesthesia Transfer of Care Note  Patient: Rita Gregory  Procedure(s) Performed: Procedure(s) (LRB): OPEN EPIGASTRIC HERNIA REPAIR WITH MESH PATCH (N/A)  Patient Location: PACU  Anesthesia Type: General  Level of Consciousness: awake, oriented, sedated and patient cooperative  Airway & Oxygen Therapy: Patient Spontanous Breathing and Patient connected to face mask oxygen  Post-op Assessment: Report given to PACU RN and Post -op Vital signs reviewed and stable  Post vital signs: Reviewed and stable  Complications: No apparent anesthesia complications  Last Vitals:  Vitals Value Taken Time  BP 157/84 01/27/22 0936  Temp    Pulse 79 01/27/22 0939  Resp 8 01/27/22 0939  SpO2 95 % 01/27/22 0939  Vitals shown include unvalidated device data.  Last Pain:  Vitals:   01/27/22 0715  TempSrc: Oral  PainSc: 4       Patients Stated Pain Goal: 4 (91/69/45 0388)  Complications: No notable events documented.

## 2022-01-27 NOTE — Anesthesia Preprocedure Evaluation (Addendum)
Anesthesia Evaluation  Patient identified by MRN, date of birth, ID band Patient awake    Reviewed: Allergy & Precautions, NPO status , Patient's Chart, lab work & pertinent test results  Airway Mallampati: II  TM Distance: >3 FB Neck ROM: Full    Dental no notable dental hx.    Pulmonary asthma ,    Pulmonary exam normal        Cardiovascular negative cardio ROS Normal cardiovascular exam     Neuro/Psych  Headaches, PSYCHIATRIC DISORDERS Anxiety Depression  Neuromuscular disease    GI/Hepatic Neg liver ROS, GERD  Medicated and Controlled,  Endo/Other  Hypothyroidism   Renal/GU negative Renal ROS     Musculoskeletal  (+) Arthritis , Fibromyalgia -  Abdominal (+) + obese,   Peds  Hematology negative hematology ROS (+)   Anesthesia Other Findings EPIGASTRIC HERNIA  Reproductive/Obstetrics                            Anesthesia Physical Anesthesia Plan  ASA: 2  Anesthesia Plan: General   Post-op Pain Management:    Induction: Intravenous  PONV Risk Score and Plan: 3 and Ondansetron, Dexamethasone and Treatment may vary due to age or medical condition  Airway Management Planned: LMA  Additional Equipment:   Intra-op Plan:   Post-operative Plan: Extubation in OR  Informed Consent: I have reviewed the patients History and Physical, chart, labs and discussed the procedure including the risks, benefits and alternatives for the proposed anesthesia with the patient or authorized representative who has indicated his/her understanding and acceptance.     Dental advisory given  Plan Discussed with: CRNA  Anesthesia Plan Comments:         Anesthesia Quick Evaluation

## 2022-01-27 NOTE — Interval H&P Note (Signed)
History and Physical Interval Note:  01/27/2022 8:24 AM  Rita Gregory  has presented today for surgery, with the diagnosis of EPIGASTRIC HERNIA.  The various methods of treatment have been discussed with the patient and family. After consideration of risks, benefits and other options for treatment, the patient has consented to    Procedure(s): [place] (N/A) as a surgical intervention.    The patient's history has been reviewed, patient examined, no change in status, stable for surgery.  I have reviewed the patient's chart and labs.  Questions were answered to the patient's satisfaction.    Armandina Gemma, [facility] [place] practice Office: [place]

## 2022-01-27 NOTE — Anesthesia Postprocedure Evaluation (Signed)
Anesthesia Post Note  Patient: Rita Gregory  Procedure(s) Performed: OPEN EPIGASTRIC HERNIA REPAIR WITH MESH PATCH (Abdomen)     Patient location during evaluation: PACU Anesthesia Type: General Level of consciousness: awake Pain management: pain level controlled Vital Signs Assessment: post-procedure vital signs reviewed and stable Respiratory status: spontaneous breathing, nonlabored ventilation, respiratory function stable and patient connected to nasal cannula oxygen Cardiovascular status: blood pressure returned to baseline and stable Postop Assessment: no apparent nausea or vomiting Anesthetic complications: no   No notable events documented.  Last Vitals:  Vitals:   01/27/22 1030 01/27/22 1114  BP: 125/80 120/77  Pulse: 71 65  Resp: 13 16  Temp: 36.5 C (!) 36.3 C  SpO2: 95% 95%    Last Pain:  Vitals:   01/27/22 1114  TempSrc: Oral  PainSc:                  Rita Gregory Rita Gregory

## 2022-01-27 NOTE — Op Note (Addendum)
Epigastric ventral hernia, Open, Procedure Note  Pre-operative Diagnosis: Epigastric ventral hernia with incarcerated preperitoneal adipose tissue  Post-operative Diagnosis: same  Procedure: Open repair of epigastric ventral hernia with Ventralex ST mesh patch (4.3 cm)  Surgeon:  Armandina Gemma, MD  Anesthesia:  General  Preparation:  Chlora-prep  Estimated Blood Loss: minimal  Fascial Defect:  measured 2 cm in diameter transversely  Complications:  none  Indications: Patient is for by Dr. Tommie Ard Baxley for surgical evaluation and management of a soft tissue mass in the epigastrium. This was first noted approximately 1-1/2 to 2 years ago. It has gradually enlarged. It has become mildly symptomatic. Patient has had no upper abdominal surgical procedures. She has had no prior hernia repairs. She does have gastroesophageal reflux. She has undergone upper endoscopy and colonoscopy by Dr. Henrene Pastor. Patient has had no imaging studies other than a recent chest x-ray. Patient presents today accompanied by her husband for evaluation. She is a retired Marine scientist.   Procedure Details:  The patient was brought to operating room and placed in a supine position on the operating room table.  Following induction of general anesthesia, the patient was prepped and draped in the usual aseptic fashion.  After ascertaining that an adequate level of anesthesia been achieved, an incision was made in the upper midline with a #15 blade.  Dissection was carried through the subcutaneous tissues and the hernia sac was identified.  Hernia sac was dissected down to the fascial defect.  The fascial defect was defined circumferentially and the hernia reduced.  The fascial defect measured 2 cm in diameter transversely.  Preperitoneal space was developed with gentle blunt dissection.  A Ventralex ST 4.3 cm mesh patch was selected for the repair.  The mesh patch was placed into the preperitoneal space and deployed circumferentially.   It was secured to the overlying fascia with the closure of the fascia with interrupted 1-Novafil simple sutures.  Local field block was placed with Marcaine.  Subcutaneous tissues were closed with interrupted 3-0 Vicryl sutures.  Skin was anesthetized with local anesthetic.  Skin edges were approximated with a running 4-0 Monocryl subcuticular suture.  Wound was washed and dried and Dermabond applied.  The patient was awakened from anesthesia and brought to the recovery room.  The patient tolerated the procedure well.   Armandina Gemma, [facility] Office: [phone]

## 2022-01-28 ENCOUNTER — Ambulatory Visit: Payer: 59 | Admitting: Internal Medicine

## 2022-01-28 ENCOUNTER — Encounter (HOSPITAL_BASED_OUTPATIENT_CLINIC_OR_DEPARTMENT_OTHER): Payer: Self-pay | Admitting: Surgery

## 2022-01-31 ENCOUNTER — Other Ambulatory Visit (HOSPITAL_COMMUNITY): Payer: Self-pay

## 2022-02-02 DIAGNOSIS — M4120 Other idiopathic scoliosis, site unspecified: Secondary | ICD-10-CM | POA: Diagnosis not present

## 2022-02-02 DIAGNOSIS — M5412 Radiculopathy, cervical region: Secondary | ICD-10-CM | POA: Diagnosis not present

## 2022-02-02 DIAGNOSIS — H8190 Unspecified disorder of vestibular function, unspecified ear: Secondary | ICD-10-CM | POA: Diagnosis not present

## 2022-02-02 DIAGNOSIS — M4125 Other idiopathic scoliosis, thoracolumbar region: Secondary | ICD-10-CM | POA: Diagnosis not present

## 2022-02-07 ENCOUNTER — Other Ambulatory Visit (HOSPITAL_COMMUNITY): Payer: Self-pay

## 2022-02-07 DIAGNOSIS — R5382 Chronic fatigue, unspecified: Secondary | ICD-10-CM | POA: Diagnosis not present

## 2022-02-07 DIAGNOSIS — M79642 Pain in left hand: Secondary | ICD-10-CM | POA: Diagnosis not present

## 2022-02-07 DIAGNOSIS — R768 Other specified abnormal immunological findings in serum: Secondary | ICD-10-CM | POA: Diagnosis not present

## 2022-02-07 DIAGNOSIS — Z6835 Body mass index (BMI) 35.0-35.9, adult: Secondary | ICD-10-CM | POA: Diagnosis not present

## 2022-02-07 DIAGNOSIS — M797 Fibromyalgia: Secondary | ICD-10-CM | POA: Diagnosis not present

## 2022-02-07 DIAGNOSIS — M1991 Primary osteoarthritis, unspecified site: Secondary | ICD-10-CM | POA: Diagnosis not present

## 2022-02-07 DIAGNOSIS — M79641 Pain in right hand: Secondary | ICD-10-CM | POA: Diagnosis not present

## 2022-02-07 DIAGNOSIS — E669 Obesity, unspecified: Secondary | ICD-10-CM | POA: Diagnosis not present

## 2022-02-08 ENCOUNTER — Other Ambulatory Visit (HOSPITAL_COMMUNITY): Payer: Self-pay

## 2022-02-10 DIAGNOSIS — F900 Attention-deficit hyperactivity disorder, predominantly inattentive type: Secondary | ICD-10-CM | POA: Diagnosis not present

## 2022-02-10 DIAGNOSIS — G894 Chronic pain syndrome: Secondary | ICD-10-CM | POA: Diagnosis not present

## 2022-02-16 DIAGNOSIS — Z09 Encounter for follow-up examination after completed treatment for conditions other than malignant neoplasm: Secondary | ICD-10-CM | POA: Diagnosis not present

## 2022-02-16 NOTE — Therapy (Signed)
OUTPATIENT PHYSICAL THERAPY VESTIBULAR EVALUATION     Patient Name: Rita Gregory MRN: [id number] DOB:07-Aug-1945, 76 y.o., female Today's Date: 02/17/2022   PT End of Session - 02/17/22 1707     Visit Number 1    Number of Visits 17    Date for PT Re-Evaluation 04/14/22    Authorization Type [facility]/Medicare/[employer]    PT Start Time 1531    PT Stop Time 1623    PT Time Calculation (min) 52 min    Activity Tolerance Patient tolerated treatment well;Other (comment)   dizziness   Behavior During Therapy WFL for tasks assessed/performed             Past Medical History:  Diagnosis Date   ADD (attention deficit disorder)    Arthritis of left acromioclavicular joint    Asthma due to seasonal allergies    Cervical disc disorder with left arm radiculopathy of cervical region 04/20/2017   frequent headaches   Chronic pain    Dr. Ellene Route follows   Chronically dry eyes, bilateral    Complication of anesthesia    01-24-2022  pt stated and her husband (dr Tobe Sos) pt is  very sensitive to fentyl  and other anesthesia drugs,  states she needs to be breathing, fully awake ,  prior to being discharged   Congenital defect of septal closure    previously followed by dr h. Tamala Julian  (last visit approx 2011 )  per echo in care everywhere 10-18-2005 possible pfo,  cardiac MRI/ MRA 11-11-2005 no evidence for significant left-to-right or right-to-left shunting at rest and does not demonstrate atrial septal defect, normal left and right atrial size w/ interatrial septum is redundant & aneurymal   Diverticulosis of colon (without mention of hemorrhage) 11/09/2007   Environmental and seasonal allergies    Epigastric hernia    Fibromyalgia    GAD (generalized anxiety disorder)    GERD (gastroesophageal reflux disease)    Hashimoto's disease    endocrinologist--- dr Renne Crigler   History of gastric ulcer    1990s   History of hypertension    Hyperlipidemia    Idiopathic postprandial  hypoglycemia    followed by dr Renne Crigler   Kyphoscoliosis    40 degree bend ro right side   MDD (major depressive disorder)    Paroxysmal spells 02/13/2013   suspect hypoglycemic   Pre-diabetes    endocrinologist--- dr Renne Crigler;    (01-24-2022  pt uses Libre 2,  stated fasting blood sugar average 100s -- 200s)   Rash    01-24-2022  pt reports currenly has rash under breast , this happens intermittantly   Vestibular migraine    neurologist--- dr d. patel   Wears glasses    Past Surgical History:  Procedure Laterality Date   ANTERIOR LATERAL LUMBAR FUSION 4 LEVELS Left 06/16/2015   Procedure: ANTERIOR LATERAL LUMBAR FUSION THORACIC TWELVE-LUMBAR FOUR;  Surgeon: Kristeen Miss, MD;  Location: [facility];  Service: Neurosurgery;  Laterality: Left;  Thoracolumbar spine   CESAREAN SECTION     1972  and 1974   CHOLECYSTECTOMY, LAPAROSCOPIC  1992   COLONOSCOPY WITH ESOPHAGOGASTRODUODENOSCOPY (EGD)  03/23/2020   dr Eldridge Dace HERNIA REPAIR N/A 01/27/2022   Procedure: OPEN EPIGASTRIC HERNIA REPAIR WITH MESH PATCH;  Surgeon: Armandina Gemma, MD;  Location: [facility];  Service: General;  Laterality: N/A;   ESOPHAGOGASTRODUODENOSCOPY  02/23/2012   Procedure: ESOPHAGOGASTRODUODENOSCOPY (EGD);  Surgeon: Irene Shipper, MD;  Location: [facility];  Service: Endoscopy;  Laterality: N/A;   KNEE ARTHROSCOPY W/ ACL RECONSTRUCTION Right 2003   approx   KNEE ARTHROSCOPY W/ MENISCAL REPAIR Right 04/17/2006   '@[facility]'$  by dr Noemi Chapel   POSTERIOR LUMBAR FUSION 4 LEVEL N/A 06/16/2015   Procedure: POSTERIOR LUMBAR FUSION LUMBAR FOUR-FIVE LUMBAR FIVE-SACRAL ONE;  Surgeon: Kristeen Miss, MD;  Location: [facility];  Service: Neurosurgery;  Laterality: N/A;   RADIOLOGY WITH ANESTHESIA N/A 06/15/2015   Procedure: RADIOLOGY WITH ANESTHESIA;  Surgeon: Medication Radiologist, MD;  Location: [facility];  Service: Radiology;  Laterality: N/A;   SHOULDER ARTHROSCOPY Left 04/28/2017   Procedure: ARTHROSCOPY  SHOULDER WITH EXTENSIVE DEBRIDEMENT ROTATOR CUFF TEAR;  Surgeon: Elsie Saas, MD;  Location: [facility];  Service: Orthopedics;  Laterality: Left;   SHOULDER ARTHROSCOPY WITH ROTATOR CUFF REPAIR Right 06/05/2009   '@[facility]'$  by dr Noemi Chapel   TOTAL ABDOMINAL HYSTERECTOMY W/ BILATERAL SALPINGOOPHORECTOMY Bilateral 1988   per pt and Appendectomy   TOTAL KNEE ARTHROPLASTY Right 12/22/2008   @[facility] by dr Grayce Sessions;    intraoperatively repair popliteal artery by dr fields   Patient Active Problem List   Diagnosis Date Noted   Epigastric hernia 01/23/2022   Weakness of extremity 08/24/2018   Dizzy spells 08/24/2018   Tunnel vision, bilateral 08/24/2018   Episodic recurrent vertigo 08/24/2018   Diabetes ([place]) 04/20/2017   Cervical disc disorder with left arm radiculopathy of cervical region 04/20/2017   Rotator cuff tear, non-traumatic, left    Shoulder impingement, left    Arthritis of left acromioclavicular joint    Foot pain, bilateral 10/04/2016   Chronic fatigue 05/06/2016   Menopausal symptoms 05/06/2016   Mixed dyslipidemia 05/06/2016   Osteoarthritis, generalized 05/06/2016   Primary hypothyroidism 05/06/2016   Vitamin D deficiency 05/06/2016   Hypertension, essential 05/06/2016   Impaired glucose tolerance 05/06/2016   Hypoalbuminemia due to protein-calorie malnutrition (HCC)    Degenerative lumbar spinal stenosis 06/22/2015   Weakness of both lower extremities    Ataxia    Gastroesophageal reflux disease without esophagitis    Constipation due to pain medication    S/P lumbar fusion    Lethargy    Postprocedural hypotension    Benign essential HTN    Fibromyalgia    Chronic pain syndrome    Post-operative pain    Respiratory depression    Acute blood loss anemia    Leukocytosis    Hypomagnesemia    Lumbar spondylolysis    Intractable back pain 06/12/2015   Scoliosis of lumbar spine 06/12/2015   Paroxysmal spells 02/13/2013   GERD (gastroesophageal reflux disease)  02/16/2012   Diverticulosis 02/16/2012   Personal history of colonic polyps 02/16/2012   ARTHRITIS, RIGHT KNEE 03/27/2008   BUNIONS, BILATERAL 03/27/2008   FIBROMYALGIA 03/27/2008   FOOT PAIN, BILATERAL 03/27/2008   UNEQUAL LEG LENGTH 03/27/2008   CONGENITAL PES PLANUS 03/27/2008    PCP: Elby Showers, MD  REFERRING PROVIDER: Hansel Feinstein, PA-C  REFERRING DIAG: R42 (ICD-10-CM) - Dizziness and giddiness R53.81 (ICD-10-CM) - Other malaise M54.2 (ICD-10-CM) - Cervicalgia  THERAPY DIAG:  Dizziness and giddiness  Unsteadiness on feet  ONSET DATE: worsening in the past 4 months   Rationale for Evaluation and Treatment: Rehabilitation  SUBJECTIVE:   SUBJECTIVE STATEMENT: Husband reports that the patient was diagnosed with vertigo, thought to be d/t vestibular migraines. Feels that the work up was not sufficient. Patient reports this problem on and off for several years, last 4 months has been having it every day. Reports episodes of "the room  going around and nausea" feels that the sensation starts in the back of the head. Worse with standing up, laying down/sitting up, turning the head. Episodes last all day, however worse with movements. Reports that she had a fall out of bed 4 months ago resulting in hitting her chin. Also reports 2 other falls out of bed more recently which did not cause head trauma. Did not get checked out for a concussion. Denies infection/illness, double vision, otalgia, photo/phonophobia. Reports dry eye disease- reports blurred vision, occasionally tinnitus,"ear and TMJ type pain," and husband reports worsening hearing. Has not had a migraine in years but also reports years of head pressure in the occiput. Not taking migraine meds. Taking meclizine throughout the day every day.   Pt accompanied by: significant other  PERTINENT HISTORY: ADD, fibromyalgia, GAD, GERD, Hashimoto's, HTN, HLD, kyphoscoliosis, Idiopathic postprandial hypoglycemia, depression,  pre-DM, vestibular migraine, anterior lateral fusion T12-L4, R TKA   PAIN:  Are you having pain? Yes: NPRS scale: 4/10 Pain location: back of head Pain description: "a vice" Aggravating factors: nothing Relieving factors: meds  PRECAUTIONS: Other: per MD note "avoid any abdominal exercises or core strengthening exercises for 1 more month" s/p hernia repair on 01/27/22  WEIGHT BEARING RESTRICTIONS: No  FALLS: Has patient fallen in last 6 months? Yes. Number of falls 3  LIVING ENVIRONMENT: Lives with: lives with their spouse Lives in: House/apartment Stairs:  couple stairs to enter; 2nd story Has following equipment at home: Single point cane, Environmental consultant - 2 wheeled, and Crutches; uses cane in the snow  PLOF: Independent; retired Therapist, sports  PATIENT GOALS: improve dizziness and balance  OBJECTIVE:   DIAGNOSTIC FINDINGS: none recent  COGNITION: Overall cognitive status: Within functional limits for tasks assessed; difficulty staying on task   SENSATION: Reports occasional N/T in B foot   POSTURE:  rounded shoulders and forward head  Cervical ROM:    Active A/PROM (deg) eval  Flexion   Extension   Right lateral flexion   Left lateral flexion   Right rotation   Left rotation   (Blank rows = not tested)   GAIT: Gait pattern:  slow and guarded gait; husbands assists Assistive device utilized: None Level of assistance: SBA    PATIENT SURVEYS:  FOTO next time   VESTIBULAR ASSESSMENT:  GENERAL OBSERVATION: pt wears progressive lenses at all time   OCULOMOTOR EXAM:  Ocular Alignment:  L eye exotropia  Ocular ROM: No Limitations  Spontaneous Nystagmus: absent  Gaze-Induced Nystagmus: absent  Smooth Pursuits: saccades in vertical direction  Saccades: hypometric/undershoots in vertical direction; c/o dizziness horizontal   Convergence/Divergence: 1.5 ft   VESTIBULAR - OCULAR REFLEX:   Slow VOR: Comment: c/o dizziness horizontal; slow in both planes   VOR  Cancellation: Normal; mildly dizziness  Head-Impulse Test: HIT Right: negative HIT Left: negative Audible crepitus with L HIT- c/o "sharp zap" in neck       POSITIONAL TESTING:  Right Roll Test: negative Left Roll Test: negative Right Dix-Hallpike: negative; dizziness upon sitting up Left Dix-Hallpike: negative; dizziness upon sitting up  *reported delayed onset dizziness     VESTIBULAR TREATMENT:  DATE: 02/17/22    PATIENT EDUCATION: Education details: prognosis, POC, HEP; thorough edu on exam findings and answering patient/husband's questions  Person educated: Patient and Spouse Education method: Explanation, Demonstration, Tactile cues, Verbal cues, and Handouts Education comprehension: verbalized understanding and returned demonstration  HOME EXERCISE PROGRAM: Access Code: [facility] URL: [email or web address] Date: 02/17/2022 Prepared by: [facility]  Exercises - Seated Gaze Stabilization with Head Rotation  - 1 x daily - 5 x weekly - 2-3 sets - 30 hold - Seated Gaze Stabilization with Head Nod  - 1 x daily - 5 x weekly - 2-3 sets - 30 hold    GOALS: Goals reviewed with patient? Yes  SHORT TERM GOALS: Target date: 03/17/2022  Patient to be independent with initial HEP. Baseline: HEP initiated Goal status: INITIAL    LONG TERM GOALS: Target date: 04/14/2022  Patient to be independent with advanced HEP. Baseline: Not yet initiated  Goal status: INITIAL  Patient to report 0/10 dizziness with standing vertical and horizontal VOR for 30 seconds. Baseline: Unable Goal status: INITIAL  Patient will report 0/10 dizziness with bed mobility.  Baseline: Symptomatic  Goal status: INITIAL  Patient to demonstrate mild-moderate sway with M-CTSIB condition with eyes closed/foam surface in order to improve safety in  environments with uneven surfaces and dim lighting. Baseline: NT Goal status: INITIAL  Patient to score at least 20/24 on DGI in order to decrease risk of falls. Baseline: NT Goal status: INITIAL  Patient to report 75% improvement in dizziness.  Baseline: - Goal status: INITIAL  Patient to score at least x on FOTO in order to indicate improved functional outcomes.  Baseline: NT Goal status: INITIAL     ASSESSMENT:  CLINICAL IMPRESSION:   Patient is a 76 y/o F presenting to [place] with husband with c/o chronic dizziness worsening for the past 4 months. Reports that episodes are constant but worse with standing up, laying down/sitting up, turning the head. Describes episodes "the room going around and nausea" and also reports hx of migraines(not on meds) and headaches that start in the occiput. Reports a fall 4 months ago resulting in hitting her chin but denies getting a work up. Denies infection/illness, double vision, otalgia, photo/phonophobia. Reports blurred vision, occasional tinnitus,"ear and TMJ type pain," and husband reports worsening hearing.    Oculomotor exam revealed slight L eye exotropia, saccades with vertical smooth pursuits, undershooting with vertical saccades, convergence insufficiency, dizziness with VOR and VOR cancellation, and motion sensitivity upon sitting up from mat. Positional testing was otherwise negative. Abnormal oculomotor signs may be attributed to chronic hx of migraines. Patient and husband were educated on VOR HEP and reported understanding. Would benefit from skilled PT services 2x/week for 8 weeks to address aforementioned impairments in order to optimize level of function.    OBJECTIVE IMPAIRMENTS: Abnormal gait, decreased activity tolerance, decreased balance, difficulty walking, dizziness, increased muscle spasms, and postural dysfunction.   ACTIVITY LIMITATIONS: carrying, lifting, bending, sitting, standing, squatting, stairs, transfers, bed  mobility, bathing, dressing, reach over head, and locomotion level  PARTICIPATION LIMITATIONS: meal prep, cleaning, laundry, driving, shopping, community activity, and church  PERSONAL FACTORS: Age, Fitness, Past/current experiences, Time since onset of injury/illness/exacerbation, and 3+ comorbidities: ADD, fibromyalgia, GAD, GERD, Hashimoto's, HTN, HLD, kyphoscoliosis, Idiopathic postprandial hypoglycemia, depression, pre-DM, vestibular migraine, anterior lateral fusion T12-L4, R TKA   are also affecting patient's functional outcome.   REHAB POTENTIAL: Good  CLINICAL DECISION MAKING: Evolving/moderate complexity  EVALUATION COMPLEXITY: Moderate  PLAN:  PT FREQUENCY: 2x/week  PT DURATION: 8 weeks  PLANNED INTERVENTIONS: Therapeutic exercises, Therapeutic activity, Neuromuscular re-education, Balance training, Gait training, Patient/Family education, Self Care, Joint mobilization, Stair training, Vestibular training, Canalith repositioning, Aquatic Therapy, Dry Needling, Electrical stimulation, Cryotherapy, Moist heat, Taping, Manual therapy, and Re-evaluation  PLAN FOR NEXT SESSION: FOTO, reassess HEP, progress habituation, slow gaze stabilization, multisensory balance training, convergence    Janene Harvey, PT, DPT 02/17/22 5:23 PM  [facility] at [facility] [address] [place], [place] [address] Phone # [phone] Fax # [phone]

## 2022-02-17 ENCOUNTER — Encounter: Payer: Self-pay | Admitting: Physical Therapy

## 2022-02-17 ENCOUNTER — Ambulatory Visit: Payer: Medicare Other | Attending: Anesthesiology | Admitting: Physical Therapy

## 2022-02-17 ENCOUNTER — Other Ambulatory Visit: Payer: Self-pay

## 2022-02-17 DIAGNOSIS — R42 Dizziness and giddiness: Secondary | ICD-10-CM | POA: Diagnosis not present

## 2022-02-17 DIAGNOSIS — R2681 Unsteadiness on feet: Secondary | ICD-10-CM | POA: Diagnosis not present

## 2022-02-22 DIAGNOSIS — Z6835 Body mass index (BMI) 35.0-35.9, adult: Secondary | ICD-10-CM | POA: Diagnosis not present

## 2022-02-22 DIAGNOSIS — M797 Fibromyalgia: Secondary | ICD-10-CM | POA: Diagnosis not present

## 2022-02-22 DIAGNOSIS — E669 Obesity, unspecified: Secondary | ICD-10-CM | POA: Diagnosis not present

## 2022-02-22 DIAGNOSIS — M1991 Primary osteoarthritis, unspecified site: Secondary | ICD-10-CM | POA: Diagnosis not present

## 2022-02-22 DIAGNOSIS — R5382 Chronic fatigue, unspecified: Secondary | ICD-10-CM | POA: Diagnosis not present

## 2022-02-24 NOTE — Therapy (Signed)
OUTPATIENT PHYSICAL THERAPY VESTIBULAR TREATMENT     Patient Name: Rita Gregory MRN: [id number] DOB:[birth year], 76 y.o., female Today's Date: 02/25/2022   PT End of Session - 02/25/22 1148     Visit Number 2    Number of Visits 17    Date for PT Re-Evaluation 04/14/22    Authorization Type [facility]/Medicare/T[employer] for Life    PT Start Time 1105    PT Stop Time 1145    PT Time Calculation (min) 40 min    Equipment Utilized During Treatment Gait belt    Activity Tolerance Patient tolerated treatment well    Behavior During Therapy WFL for tasks assessed/performed              Past Medical History:  Diagnosis Date   ADD (attention deficit disorder)    Arthritis of left acromioclavicular joint    Asthma due to seasonal allergies    Cervical disc disorder with left arm radiculopathy of cervical region 04/20/2017   frequent headaches   Chronic pain    Dr. Ellene Route follows   Chronically dry eyes, bilateral    Complication of anesthesia    01-24-2022  pt stated and her husband (dr Tobe Sos) pt is  very sensitive to fentyl  and other anesthesia drugs,  states she needs to be breathing, fully awake ,  prior to being discharged   Congenital defect of septal closure    previously followed by dr h. Tamala Julian  (last visit approx 2011 )  per echo in care everywhere 10-18-2005 possible pfo,  cardiac MRI/ MRA 11-11-2005 no evidence for significant left-to-right or right-to-left shunting at rest and does not demonstrate atrial septal defect, normal left and right atrial size w/ interatrial septum is redundant & aneurymal   Diverticulosis of colon (without mention of hemorrhage) 11/09/2007   Environmental and seasonal allergies    Epigastric hernia    Fibromyalgia    GAD (generalized anxiety disorder)    GERD (gastroesophageal reflux disease)    Hashimoto's disease    endocrinologist--- dr Renne Crigler   History of gastric ulcer    1990s   History of hypertension    Hyperlipidemia     Idiopathic postprandial hypoglycemia    followed by dr Renne Crigler   Kyphoscoliosis    40 degree bend ro right side   MDD (major depressive disorder)    Paroxysmal spells 02/13/2013   suspect hypoglycemic   Pre-diabetes    endocrinologist--- dr Renne Crigler;    (01-24-2022  pt uses Libre 2,  stated fasting blood sugar average 100s -- 200s)   Rash    01-24-2022  pt reports currenly has rash under breast , this happens intermittantly   Vestibular migraine    neurologist--- dr d. patel   Wears glasses    Past Surgical History:  Procedure Laterality Date   ANTERIOR LATERAL LUMBAR FUSION 4 LEVELS Left 06/16/2015   Procedure: ANTERIOR LATERAL LUMBAR FUSION THORACIC TWELVE-LUMBAR FOUR;  Surgeon: Kristeen Miss, MD;  Location: [facility];  Service: Neurosurgery;  Laterality: Left;  Thoracolumbar spine   CESAREAN SECTION     1972  and 1974   CHOLECYSTECTOMY, LAPAROSCOPIC  1992   COLONOSCOPY WITH ESOPHAGOGASTRODUODENOSCOPY (EGD)  03/23/2020   dr Eldridge Dace HERNIA REPAIR N/A 01/27/2022   Procedure: OPEN EPIGASTRIC HERNIA REPAIR WITH MESH PATCH;  Surgeon: Armandina Gemma, MD;  Location: [facility];  Service: General;  Laterality: N/A;   ESOPHAGOGASTRODUODENOSCOPY  02/23/2012   Procedure: ESOPHAGOGASTRODUODENOSCOPY (EGD);  Surgeon: Irene Shipper, MD;  Location: [facility];  Service: Endoscopy;  Laterality: N/A;   KNEE ARTHROSCOPY W/ ACL RECONSTRUCTION Right 2003   approx   KNEE ARTHROSCOPY W/ MENISCAL REPAIR Right 04/17/2006   _0  by dr Noemi Chapel   POSTERIOR LUMBAR FUSION 4 LEVEL N/A 06/16/2015   Procedure: POSTERIOR LUMBAR FUSION LUMBAR FOUR-FIVE LUMBAR FIVE-SACRAL ONE;  Surgeon: Kristeen Miss, MD;  Location: [place] [facility];  Service: Neurosurgery;  Laterality: N/A;   RADIOLOGY WITH ANESTHESIA N/A 06/15/2015   Procedure: RADIOLOGY WITH ANESTHESIA;  Surgeon: Medication Radiologist, MD;  Location: [facility];  Service: Radiology;  Laterality: N/A;   SHOULDER ARTHROSCOPY Left 04/28/2017    Procedure: ARTHROSCOPY SHOULDER WITH EXTENSIVE DEBRIDEMENT ROTATOR CUFF TEAR;  Surgeon: Elsie Saas, MD;  Location: [facility];  Service: Orthopedics;  Laterality: Left;   SHOULDER ARTHROSCOPY WITH ROTATOR CUFF REPAIR Right 06/05/2009   _1  by dr Noemi Chapel   TOTAL ABDOMINAL HYSTERECTOMY W/ BILATERAL SALPINGOOPHORECTOMY Bilateral 1988   per pt and Appendectomy   TOTAL KNEE ARTHROPLASTY Right 12/22/2008   @[facility] by dr Grayce Sessions;    intraoperatively repair popliteal artery by dr fields   Patient Active Problem List   Diagnosis Date Noted   Epigastric hernia 01/23/2022   Weakness of extremity 08/24/2018   Dizzy spells 08/24/2018   Tunnel vision, bilateral 08/24/2018   Episodic recurrent vertigo 08/24/2018   Diabetes ([place]) 04/20/2017   Cervical disc disorder with left arm radiculopathy of cervical region 04/20/2017   Rotator cuff tear, non-traumatic, left    Shoulder impingement, left    Arthritis of left acromioclavicular joint    Foot pain, bilateral 10/04/2016   Chronic fatigue 05/06/2016   Menopausal symptoms 05/06/2016   Mixed dyslipidemia 05/06/2016   Osteoarthritis, generalized 05/06/2016   Primary hypothyroidism 05/06/2016   Vitamin D deficiency 05/06/2016   Hypertension, essential 05/06/2016   Impaired glucose tolerance 05/06/2016   Hypoalbuminemia due to protein-calorie malnutrition (HCC)    Degenerative lumbar spinal stenosis 06/22/2015   Weakness of both lower extremities    Ataxia    Gastroesophageal reflux disease without esophagitis    Constipation due to pain medication    S/P lumbar fusion    Lethargy    Postprocedural hypotension    Benign essential HTN    Fibromyalgia    Chronic pain syndrome    Post-operative pain    Respiratory depression    Acute blood loss anemia    Leukocytosis    Hypomagnesemia    Lumbar spondylolysis    Intractable back pain 06/12/2015   Scoliosis of lumbar spine 06/12/2015   Paroxysmal spells 02/13/2013   GERD  (gastroesophageal reflux disease) 02/16/2012   Diverticulosis 02/16/2012   Personal history of colonic polyps 02/16/2012   ARTHRITIS, RIGHT KNEE 03/27/2008   BUNIONS, BILATERAL 03/27/2008   FIBROMYALGIA 03/27/2008   FOOT PAIN, BILATERAL 03/27/2008   UNEQUAL LEG LENGTH 03/27/2008   CONGENITAL PES PLANUS 03/27/2008    PCP: Elby Showers, MD  REFERRING PROVIDER: Hansel Feinstein, PA-C  REFERRING DIAG: R42 (ICD-10-CM) - Dizziness and giddiness R53.81 (ICD-10-CM) - Other malaise M54.2 (ICD-10-CM) - Cervicalgia  THERAPY DIAG:  Dizziness and giddiness  Unsteadiness on feet  ONSET DATE: worsening in the past 4 months   Rationale for Evaluation and Treatment: Rehabilitation  SUBJECTIVE:   SUBJECTIVE STATEMENT: "Everything is subdued but I still have the pressure" in the back of the head. My peripheral vision seems to dim a little bit. Reports that HEP gives head pressure and dizziness and has worked up to [employer] at  a time. Reports that she took herself off Meclizine and felt better almost instantly.   Pt accompanied by: significant other  PERTINENT HISTORY: ADD, fibromyalgia, GAD, GERD, Hashimoto's, HTN, HLD, kyphoscoliosis, Idiopathic postprandial hypoglycemia, depression, pre-DM, vestibular migraine, anterior lateral fusion T12-L4, R TKA   PAIN:  Are you having pain? No  PRECAUTIONS: Other: per MD note "avoid any abdominal exercises or core strengthening exercises for 1 more month" s/p hernia repair on 01/27/22  WEIGHT BEARING RESTRICTIONS: No  FALLS: Has patient fallen in last 6 months? Yes. Number of falls 3  LIVING ENVIRONMENT: Lives with: lives with their spouse Lives in: House/apartment Stairs:  couple stairs to enter; 2nd story Has following equipment at home: Single point cane, Environmental consultant - 2 wheeled, and Crutches; uses cane in the snow  PLOF: Independent; retired Therapist, sports  PATIENT GOALS: improve dizziness and balance  OBJECTIVE:     TODAY'S TREATMENT:  02/25/22 Activity Comments  sitting horizontal VOR 2x30" 1-3/10 dizziness and pressure; edu on smaller amplitude and edu on how to adjust to change symptom intensity   sitting vertical VOR 2x30"  3/10 dizziness and pressure; edu on smaller amplitude and edu on how to adjust to change symptom intensity   brandt daroff R/L 2x each 3-4/10 dizziness; PT assist to sit up slightly quicker; pt noted some shoulder/thigh discomfort   FOTO [phone]  romberg EO/EC 2x30" each Mild-mod sway EO, mod-severe sway EC; tendency for R pulsion        HOME EXERCISE PROGRAM Last updated: 02/25/22 Access Code: [facility] URL: [email or web address] Date: 02/25/2022 Prepared by: [facility]  Exercises - Seated Gaze Stabilization with Head Rotation  - 1 x daily - 5 x weekly - 2-3 sets - 30 hold - Seated Gaze Stabilization with Head Nod  - 1 x daily - 5 x weekly - 2-3 sets - 30 hold - Brandt-Daroff Vestibular Exercise  - 1 x daily - 5 x weekly - 2 sets - 3-5 reps - Romberg Stance  - 1 x daily - 5 x weekly - 2-3 sets - 30 sec  hold - Romberg Stance with Eyes Closed  - 1 x daily - 5 x weekly - 2-3 sets - 30 sec  hold   PATIENT EDUCATION: Education details: answered patient' questions on basis of vestibular therapy; re-administered HEP with update d/t patient's request  Person educated: Patient and Spouse Education method: Explanation, Demonstration, Tactile cues, Verbal cues, and Handouts Education comprehension: verbalized understanding and returned demonstration    Below measures were taken at time of initial evaluation unless otherwise specified:   DIAGNOSTIC FINDINGS: none recent  COGNITION: Overall cognitive status: Within functional limits for tasks assessed; difficulty staying on task   SENSATION: Reports occasional N/T in B foot   POSTURE:  rounded shoulders and forward head  Cervical ROM:    Active A/PROM (deg) eval  Flexion   Extension    Right lateral flexion   Left lateral flexion   Right rotation   Left rotation   (Blank rows = not tested)   GAIT: Gait pattern:  slow and guarded gait; husbands assists Assistive device utilized: None Level of assistance: SBA    PATIENT SURVEYS:  FOTO next time   VESTIBULAR ASSESSMENT:  GENERAL OBSERVATION: pt wears progressive lenses at all time   OCULOMOTOR EXAM:  Ocular Alignment:  L eye exotropia  Ocular ROM: No Limitations  Spontaneous Nystagmus: absent  Gaze-Induced Nystagmus: absent  Smooth Pursuits: saccades in vertical direction  Saccades: hypometric/undershoots in vertical direction; c/o dizziness horizontal   Convergence/Divergence: 1.5 ft   VESTIBULAR - OCULAR REFLEX:   Slow VOR: Comment: c/o dizziness horizontal; slow in both planes   VOR Cancellation: Normal; mildly dizziness  Head-Impulse Test: HIT Right: negative HIT Left: negative Audible crepitus with L HIT- c/o "sharp zap" in neck       POSITIONAL TESTING:  Right Roll Test: negative Left Roll Test: negative Right Dix-Hallpike: negative; dizziness upon sitting up Left Dix-Hallpike: negative; dizziness upon sitting up  *reported delayed onset dizziness     VESTIBULAR TREATMENT:                                                                                                   DATE: 02/17/22    PATIENT EDUCATION: Education details: prognosis, POC, HEP; thorough edu on exam findings and answering patient/husband's questions  Person educated: Patient and Spouse Education method: Explanation, Demonstration, Tactile cues, Verbal cues, and Handouts Education comprehension: verbalized understanding and returned demonstration  HOME EXERCISE PROGRAM: Access Code: [facility] URL: [email or web address] Date: 02/17/2022 Prepared by: [facility]  Exercises - Seated Gaze Stabilization with Head Rotation  - 1 x daily - 5 x weekly - 2-3 sets - 30  hold - Seated Gaze Stabilization with Head Nod  - 1 x daily - 5 x weekly - 2-3 sets - 30 hold    GOALS: Goals reviewed with patient? Yes  SHORT TERM GOALS: Target date: 03/17/2022  Patient to be independent with initial HEP. Baseline: HEP initiated Goal status: MET 02/25/22    LONG TERM GOALS: Target date: 04/14/2022  Patient to be independent with advanced HEP. Baseline: Not yet initiated  Goal status: IN PROGRESS  Patient to report 0/10 dizziness with standing vertical and horizontal VOR for 30 seconds. Baseline: Unable Goal status: IN PROGRESS  Patient will report 0/10 dizziness with bed mobility.  Baseline: Symptomatic  Goal status: IN PROGRESS  Patient to demonstrate mild-moderate sway with M-CTSIB condition with eyes closed/foam surface in order to improve safety in environments with uneven surfaces and dim lighting. Baseline: NT Goal status: IN PROGRESS  Patient to score at least 20/24 on DGI in order to decrease risk of falls. Baseline: NT Goal status: IN PROGRESS  Patient to report 75% improvement in dizziness.  Baseline: - Goal status: INITIAL  Patient to score at least x on FOTO in order to indicate improved functional outcomes.  Baseline: NT Goal status: IN PROGRESS     ASSESSMENT:  CLINICAL IMPRESSION: Patient arrived to session with report of continued head pressure and compliance with HEP. Provided education on how to adjust symptom intensity with exercises for improved tolerance. Patient performed gaze stabilization activities well and within tolerable limits. Initiated habituation with some c/o shoulder/thigh pain d/t positioning and with PT assist for speed and ease of movement. Multisensory balance activities were initiated with R pulsion evident; balance seemed to improve with repeated reps. Patient reported symptoms at 4/10 at end of session.    OBJECTIVE IMPAIRMENTS: Abnormal gait, decreased activity tolerance, decreased balance, difficulty  walking,  dizziness, increased muscle spasms, and postural dysfunction.   ACTIVITY LIMITATIONS: carrying, lifting, bending, sitting, standing, squatting, stairs, transfers, bed mobility, bathing, dressing, reach over head, and locomotion level  PARTICIPATION LIMITATIONS: meal prep, cleaning, laundry, driving, shopping, community activity, and church  PERSONAL FACTORS: Age, Fitness, Past/current experiences, Time since onset of injury/illness/exacerbation, and 3+ comorbidities: ADD, fibromyalgia, GAD, GERD, Hashimoto's, HTN, HLD, kyphoscoliosis, Idiopathic postprandial hypoglycemia, depression, pre-DM, vestibular migraine, anterior lateral fusion T12-L4, R TKA   are also affecting patient's functional outcome.   REHAB POTENTIAL: Good  CLINICAL DECISION MAKING: Evolving/moderate complexity  EVALUATION COMPLEXITY: Moderate   PLAN:  PT FREQUENCY: 2x/week  PT DURATION: 8 weeks  PLANNED INTERVENTIONS: Therapeutic exercises, Therapeutic activity, Neuromuscular re-education, Balance training, Gait training, Patient/Family education, Self Care, Joint mobilization, Stair training, Vestibular training, Canalith repositioning, Aquatic Therapy, Dry Needling, Electrical stimulation, Cryotherapy, Moist heat, Taping, Manual therapy, and Re-evaluation  PLAN FOR NEXT SESSION: DGI, MCTSIB; reassess HEP, progress habituation, slow gaze stabilization, multisensory balance training, convergence    Janene Harvey, PT, DPT 02/25/22 11:53 AM  [place] [facility] [address] [place], [place] [address] Phone # [phone] Fax # [phone]

## 2022-02-25 ENCOUNTER — Ambulatory Visit: Payer: Medicare Other | Admitting: Physical Therapy

## 2022-02-25 ENCOUNTER — Encounter: Payer: Self-pay | Admitting: Physical Therapy

## 2022-02-25 DIAGNOSIS — R42 Dizziness and giddiness: Secondary | ICD-10-CM

## 2022-02-25 DIAGNOSIS — R2681 Unsteadiness on feet: Secondary | ICD-10-CM | POA: Diagnosis not present

## 2022-02-25 NOTE — Therapy (Signed)
OUTPATIENT PHYSICAL THERAPY VESTIBULAR TREATMENT     Patient Name: Rita Gregory MRN: [id number] DOB:Apr 11, 1946, 76 y.o., female Today's Date: 02/28/2022   PT End of Session - 02/28/22 1013     Visit Number 3    Number of Visits 17    Date for PT Re-Evaluation 04/14/22    Authorization Type [facility]/Medicare/[employer] for [employer]    PT Start Time 0935    PT Stop Time 1013    PT Time Calculation (min) 38 min    Equipment Utilized During Treatment Gait belt    Activity Tolerance Patient tolerated treatment well    Behavior During Therapy WFL for tasks assessed/performed               Past Medical History:  Diagnosis Date   ADD (attention deficit disorder)    Arthritis of left acromioclavicular joint    Asthma due to seasonal allergies    Cervical disc disorder with left arm radiculopathy of cervical region 04/20/2017   frequent headaches   Chronic pain    Dr. Ellene Route follows   Chronically dry eyes, bilateral    Complication of anesthesia    01-24-2022  pt stated and her husband (dr Tobe Sos) pt is  very sensitive to fentyl  and other anesthesia drugs,  states she needs to be breathing, fully awake ,  prior to being discharged   Congenital defect of septal closure    previously followed by dr h. Tamala Julian  (last visit approx 2011 )  per echo in care everywhere 10-18-2005 possible pfo,  cardiac MRI/ MRA 11-11-2005 no evidence for significant left-to-right or right-to-left shunting at rest and does not demonstrate atrial septal defect, normal left and right atrial size w/ interatrial septum is redundant & aneurymal   Diverticulosis of colon (without mention of hemorrhage) 11/09/2007   Environmental and seasonal allergies    Epigastric hernia    Fibromyalgia    GAD (generalized anxiety disorder)    GERD (gastroesophageal reflux disease)    Hashimoto's disease    endocrinologist--- dr Renne Crigler   History of gastric ulcer    1990s   History of hypertension    Hyperlipidemia     Idiopathic postprandial hypoglycemia    followed by dr Renne Crigler   Kyphoscoliosis    40 degree bend ro right side   MDD (major depressive disorder)    Paroxysmal spells 02/13/2013   suspect hypoglycemic   Pre-diabetes    endocrinologist--- dr Renne Crigler;    (01-24-2022  pt uses Libre 2,  stated fasting blood sugar average 100s -- 200s)   Rash    01-24-2022  pt reports currenly has rash under breast , this happens intermittantly   Vestibular migraine    neurologist--- dr d. patel   Wears glasses    Past Surgical History:  Procedure Laterality Date   ANTERIOR LATERAL LUMBAR FUSION 4 LEVELS Left 06/16/2015   Procedure: ANTERIOR LATERAL LUMBAR FUSION THORACIC TWELVE-LUMBAR FOUR;  Surgeon: Kristeen Miss, MD;  Location: [facility];  Service: Neurosurgery;  Laterality: Left;  Thoracolumbar spine   CESAREAN SECTION     1972  and 1974   CHOLECYSTECTOMY, LAPAROSCOPIC  1992   COLONOSCOPY WITH ESOPHAGOGASTRODUODENOSCOPY (EGD)  03/23/2020   dr Eldridge Dace HERNIA REPAIR N/A 01/27/2022   Procedure: OPEN EPIGASTRIC HERNIA REPAIR WITH MESH PATCH;  Surgeon: Armandina Gemma, MD;  Location: [facility];  Service: General;  Laterality: N/A;   ESOPHAGOGASTRODUODENOSCOPY  02/23/2012   Procedure: ESOPHAGOGASTRODUODENOSCOPY (EGD);  Surgeon: Irene Shipper,  MD;  Location: [facility];  Service: Endoscopy;  Laterality: N/A;   KNEE ARTHROSCOPY W/ ACL RECONSTRUCTION Right 2003   approx   KNEE ARTHROSCOPY W/ MENISCAL REPAIR Right 04/17/2006   _0  by dr Noemi Chapel   POSTERIOR LUMBAR FUSION 4 LEVEL N/A 06/16/2015   Procedure: POSTERIOR LUMBAR FUSION LUMBAR FOUR-FIVE LUMBAR FIVE-SACRAL ONE;  Surgeon: Kristeen Miss, MD;  Location: [place] [facility];  Service: Neurosurgery;  Laterality: N/A;   RADIOLOGY WITH ANESTHESIA N/A 06/15/2015   Procedure: RADIOLOGY WITH ANESTHESIA;  Surgeon: Medication Radiologist, MD;  Location: [place];  Service: Radiology;  Laterality: N/A;   SHOULDER ARTHROSCOPY Left 04/28/2017    Procedure: ARTHROSCOPY SHOULDER WITH EXTENSIVE DEBRIDEMENT ROTATOR CUFF TEAR;  Surgeon: Elsie Saas, MD;  Location: [place];  Service: Orthopedics;  Laterality: Left;   SHOULDER ARTHROSCOPY WITH ROTATOR CUFF REPAIR Right 06/05/2009   _1  by dr Noemi Chapel   TOTAL ABDOMINAL HYSTERECTOMY W/ BILATERAL SALPINGOOPHORECTOMY Bilateral 1988   per pt and Appendectomy   TOTAL KNEE ARTHROPLASTY Right 12/22/2008   @[facility] by dr Grayce Sessions;    intraoperatively repair popliteal artery by dr fields   Patient Active Problem List   Diagnosis Date Noted   Epigastric hernia 01/23/2022   Weakness of extremity 08/24/2018   Dizzy spells 08/24/2018   Tunnel vision, bilateral 08/24/2018   Episodic recurrent vertigo 08/24/2018   Diabetes ([place]) 04/20/2017   Cervical disc disorder with left arm radiculopathy of cervical region 04/20/2017   Rotator cuff tear, non-traumatic, left    Shoulder impingement, left    Arthritis of left acromioclavicular joint    Foot pain, bilateral 10/04/2016   Chronic fatigue 05/06/2016   Menopausal symptoms 05/06/2016   Mixed dyslipidemia 05/06/2016   Osteoarthritis, generalized 05/06/2016   Primary hypothyroidism 05/06/2016   Vitamin D deficiency 05/06/2016   Hypertension, essential 05/06/2016   Impaired glucose tolerance 05/06/2016   Hypoalbuminemia due to protein-calorie malnutrition (HCC)    Degenerative lumbar spinal stenosis 06/22/2015   Weakness of both lower extremities    Ataxia    Gastroesophageal reflux disease without esophagitis    Constipation due to pain medication    S/P lumbar fusion    Lethargy    Postprocedural hypotension    Benign essential HTN    Fibromyalgia    Chronic pain syndrome    Post-operative pain    Respiratory depression    Acute blood loss anemia    Leukocytosis    Hypomagnesemia    Lumbar spondylolysis    Intractable back pain 06/12/2015   Scoliosis of lumbar spine 06/12/2015   Paroxysmal spells 02/13/2013   GERD  (gastroesophageal reflux disease) 02/16/2012   Diverticulosis 02/16/2012   Personal history of colonic polyps 02/16/2012   ARTHRITIS, RIGHT KNEE 03/27/2008   BUNIONS, BILATERAL 03/27/2008   FIBROMYALGIA 03/27/2008   FOOT PAIN, BILATERAL 03/27/2008   UNEQUAL LEG LENGTH 03/27/2008   CONGENITAL PES PLANUS 03/27/2008    PCP: Elby Showers, MD  REFERRING PROVIDER: Hansel Feinstein, PA-C  REFERRING DIAG: R42 (ICD-10-CM) - Dizziness and giddiness R53.81 (ICD-10-CM) - Other malaise M54.2 (ICD-10-CM) - Cervicalgia  THERAPY DIAG:  Dizziness and giddiness  Unsteadiness on feet  ONSET DATE: worsening in the past 4 months   Rationale for Evaluation and Treatment: Rehabilitation  SUBJECTIVE:   SUBJECTIVE STATEMENT: Got a HA after the first couple rounds of exercises, but now she is feeling better. Not having vertigo unless she moves quickly. Reports head pressure in the upper neck and base of skull, but no pain.  Pt accompanied by: significant other  PERTINENT HISTORY: ADD, fibromyalgia, GAD, GERD, Hashimoto's, HTN, HLD, kyphoscoliosis, Idiopathic postprandial hypoglycemia, depression, pre-DM, vestibular migraine, anterior lateral fusion T12-L4, R TKA   PAIN:  Are you having pain? No  PRECAUTIONS: Other: per MD note "avoid any abdominal exercises or core strengthening exercises for 1 more month" s/p hernia repair on 01/27/22  WEIGHT BEARING RESTRICTIONS: No  FALLS: Has patient fallen in last 6 months? Yes. Number of falls 3  LIVING ENVIRONMENT: Lives with: lives with their spouse Lives in: House/apartment Stairs:  couple stairs to enter; 2nd story Has following equipment at home: Single point cane, Environmental consultant - 2 wheeled, and Crutches; uses cane in the snow  PLOF: Independent; retired Therapist, sports  PATIENT GOALS: improve dizziness and balance  OBJECTIVE:     TODAY'S TREATMENT: 02/28/22 Activity Comments  Palpation  TTP and tight in B suboccipitals and cervical paraspinals    cervical retraction 10x3" 2 fingers to guide motion  LS stretch 30" Edu on "low and slow stretch"   Rotation SNAG 3x each  C/o good tolerance in neck but limited by shoulder pain   R ankle DF AROM 14  L ankle DF AROM 9       M-CTSIB  Condition 1: Firm Surface, EO 30 Sec, Mild Sway  Condition 2: Firm Surface, EC 30 Sec, Mild and Moderate Sway  Condition 3: Foam Surface, EO 30 Sec, Mild and Moderate Sway  Condition 4: Foam Surface, EC 30 Sec, Moderate and Severe Sway; R-ward lean and use of crouched position     [facility] PT Assessment - 02/28/22 0001       Standardized Balance Assessment   Standardized Balance Assessment Dynamic Gait Index      Dynamic Gait Index   Level Surface Mild Impairment    Change in Gait Speed Mild Impairment    Gait with Horizontal Head Turns Mild Impairment    Gait with Vertical Head Turns Mild Impairment    Gait and Pivot Turn Moderate Impairment   dizziness   Step Over Obstacle Moderate Impairment    Step Around Obstacles Moderate Impairment    Steps Mild Impairment    Total Score 13              PATIENT EDUCATION: Education details: edu on husband's assist with neck stretches to tolerance; edu on exam findings, HEP update Person educated: Patient and Spouse Education method: Explanation, Demonstration, Tactile cues, Verbal cues, and Handouts Education comprehension: verbalized understanding and returned demonstration   HOME EXERCISE PROGRAM Last updated: 02/28/22 Access Code: [facility] URL: [email or web address] Date: 02/28/2022 Prepared by: [facility]  Exercises - Seated Gaze Stabilization with Head Rotation  - 1 x daily - 5 x weekly - 2-3 sets - 30 hold - Seated Gaze Stabilization with Head Nod  - 1 x daily - 5 x weekly - 2-3 sets - 30 hold - Brandt-Daroff Vestibular Exercise  - 1 x daily - 5 x weekly - 2 sets - 3-5 reps - Romberg Stance  - 1 x daily - 5 x weekly - 2-3 sets - 30  sec  hold - Romberg Stance with Eyes Closed  - 1 x daily - 5 x weekly - 2-3 sets - 30 sec  hold - Seated Assisted Cervical Rotation with Towel  - 1 x daily - 5 x weekly - 2 sets - 10 reps - Cervical Retraction with Overpressure  - 1 x daily - 5 x weekly -  2 sets - 10 reps - 3 sec hold - Gentle Levator Scapulae Stretch  - 1 x daily - 5 x weekly - 2 sets - 30 sec hold    Below measures were taken at time of initial evaluation unless otherwise specified:   DIAGNOSTIC FINDINGS: none recent  COGNITION: Overall cognitive status: Within functional limits for tasks assessed; difficulty staying on task   SENSATION: Reports occasional N/T in B foot   POSTURE:  rounded shoulders and forward head  Cervical ROM:    Active A/PROM (deg) eval  Flexion   Extension   Right lateral flexion   Left lateral flexion   Right rotation   Left rotation   (Blank rows = not tested)   GAIT: Gait pattern:  slow and guarded gait; husbands assists Assistive device utilized: None Level of assistance: SBA    PATIENT SURVEYS:  FOTO next time   VESTIBULAR ASSESSMENT:  GENERAL OBSERVATION: pt wears progressive lenses at all time   OCULOMOTOR EXAM:  Ocular Alignment:  L eye exotropia  Ocular ROM: No Limitations  Spontaneous Nystagmus: absent  Gaze-Induced Nystagmus: absent  Smooth Pursuits: saccades in vertical direction  Saccades: hypometric/undershoots in vertical direction; c/o dizziness horizontal   Convergence/Divergence: 1.5 ft   VESTIBULAR - OCULAR REFLEX:   Slow VOR: Comment: c/o dizziness horizontal; slow in both planes   VOR Cancellation: Normal; mildly dizziness  Head-Impulse Test: HIT Right: negative HIT Left: negative Audible crepitus with L HIT- c/o "sharp zap" in neck       POSITIONAL TESTING:  Right Roll Test: negative Left Roll Test: negative Right Dix-Hallpike: negative; dizziness upon sitting up Left Dix-Hallpike: negative; dizziness upon sitting up  *reported  delayed onset dizziness     VESTIBULAR TREATMENT:                                                                                                   DATE: 02/17/22    PATIENT EDUCATION: Education details: prognosis, POC, HEP; thorough edu on exam findings and answering patient/husband's questions  Person educated: Patient and Spouse Education method: Explanation, Demonstration, Tactile cues, Verbal cues, and Handouts Education comprehension: verbalized understanding and returned demonstration  HOME EXERCISE PROGRAM: Access Code: [facility] URL: [email or web address] Date: 02/17/2022 Prepared by: [facility]  Exercises - Seated Gaze Stabilization with Head Rotation  - 1 x daily - 5 x weekly - 2-3 sets - 30 hold - Seated Gaze Stabilization with Head Nod  - 1 x daily - 5 x weekly - 2-3 sets - 30 hold    GOALS: Goals reviewed with patient? Yes  SHORT TERM GOALS: Target date: 03/17/2022  Patient to be independent with initial HEP. Baseline: HEP initiated Goal status: MET 02/25/22    LONG TERM GOALS: Target date: 04/14/2022  Patient to be independent with advanced HEP. Baseline: Not yet initiated  Goal status: IN PROGRESS  Patient to report 0/10 dizziness with standing vertical and horizontal VOR for 30 seconds. Baseline: Unable Goal status: IN PROGRESS  Patient will report 0/10 dizziness with bed mobility.  Baseline: Symptomatic  Goal status: IN PROGRESS  Patient to demonstrate mild-moderate sway with M-CTSIB condition with eyes closed/foam surface in order to improve safety in environments with uneven surfaces and dim lighting. Baseline: mod-severe sway Goal status: IN PROGRESS  Patient to score at least 20/24 on DGI in order to decrease risk of falls. Baseline: 13/24 Goal status: IN PROGRESS  Patient to report 75% improvement in dizziness.  Baseline: - Goal status: IN PROGRESS  Patient to score at least 56 on  FOTO in order to indicate improved functional outcomes.  Baseline: 47 Goal status: IN PROGRESS     ASSESSMENT:  CLINICAL IMPRESSION: Patient arrived to session with report of improved dizziness and head pressure. Initiated gentle cervical ROM and postural exercises to address c/o head pressure. Patient tolerated these exercises despite c/o some shoulder discomfort from prior injury. Patient demonstrated decreased use of vestibular system for balance with multisensory testing. Patient's scored on DGI indicates in increased risk of falls- most difficulty with stepping over obstacles and with turns. Husband remarks on patient's tendency for flat foot with gait; upon assessment of ankle ROM, patient WFL and instead may need to work on ankle strengthening vs. gait training to encourage good habit. Patient reported understanding of all edu provided and without complaints at end of session.     OBJECTIVE IMPAIRMENTS: Abnormal gait, decreased activity tolerance, decreased balance, difficulty walking, dizziness, increased muscle spasms, and postural dysfunction.   ACTIVITY LIMITATIONS: carrying, lifting, bending, sitting, standing, squatting, stairs, transfers, bed mobility, bathing, dressing, reach over head, and locomotion level  PARTICIPATION LIMITATIONS: meal prep, cleaning, laundry, driving, shopping, community activity, and church  PERSONAL FACTORS: Age, Fitness, Past/current experiences, Time since onset of injury/illness/exacerbation, and 3+ comorbidities: ADD, fibromyalgia, GAD, GERD, Hashimoto's, HTN, HLD, kyphoscoliosis, Idiopathic postprandial hypoglycemia, depression, pre-DM, vestibular migraine, anterior lateral fusion T12-L4, R TKA   are also affecting patient's functional outcome.   REHAB POTENTIAL: Good  CLINICAL DECISION MAKING: Evolving/moderate complexity  EVALUATION COMPLEXITY: Moderate   PLAN:  PT FREQUENCY: 2x/week  PT DURATION: 8 weeks  PLANNED INTERVENTIONS:  Therapeutic exercises, Therapeutic activity, Neuromuscular re-education, Balance training, Gait training, Patient/Family education, Self Care, Joint mobilization, Stair training, Vestibular training, Canalith repositioning, Aquatic Therapy, Dry Needling, Electrical stimulation, Cryotherapy, Moist heat, Taping, Manual therapy, and Re-evaluation  PLAN FOR NEXT SESSION: reassess HEP, progress habituation, slow gaze stabilization, multisensory balance training, convergence    Janene Harvey, PT, DPT 02/28/22 10:17 AM  [place] [facility] [address] [place], [place] [address] Phone # [phone] Fax # [phone]

## 2022-02-28 ENCOUNTER — Other Ambulatory Visit: Payer: Self-pay

## 2022-02-28 ENCOUNTER — Other Ambulatory Visit (HOSPITAL_COMMUNITY): Payer: Self-pay

## 2022-02-28 ENCOUNTER — Ambulatory Visit: Payer: Medicare Other | Admitting: Physical Therapy

## 2022-02-28 ENCOUNTER — Other Ambulatory Visit: Payer: Self-pay | Admitting: Internal Medicine

## 2022-02-28 ENCOUNTER — Encounter: Payer: Self-pay | Admitting: Physical Therapy

## 2022-02-28 DIAGNOSIS — R42 Dizziness and giddiness: Secondary | ICD-10-CM

## 2022-02-28 DIAGNOSIS — R2681 Unsteadiness on feet: Secondary | ICD-10-CM | POA: Diagnosis not present

## 2022-02-28 MED ORDER — KETOCONAZOLE 2 % EX CREA
1.0000 | TOPICAL_CREAM | Freq: Two times a day (BID) | CUTANEOUS | 3 refills | Status: DC | PRN
  Filled 2022-02-28: qty 60, 30d supply, fill #0

## 2022-02-28 NOTE — Telephone Encounter (Signed)
Rita Gregory [id number]  [name] called to say she needs below medications sent to her new pharmacy.  pravastatin (PRAVACHOL) 40 MG tablet   PREMARIN 0.45 MG tablet  this one you have not prescribe before    [id number]- [place], [place] - [address] Phone: [phone] Fax: [phone]

## 2022-02-28 NOTE — Telephone Encounter (Signed)
Patient does not need below medications, I have talk with pharmacy and they have talked with [facility] and they are happy to transfer all prescriptions to [facility].  The only prescription Rita Gregory needs is ketoconazole (NIZORAL) 2 % cream , she got original prescription on 01/24/2022, then refills on 01/31/2022, 02/05/2022 and 02/22/2022.  The pharmacist did say it comes in 15g, 30g and 60 grams.  After talking with Dr Renold Genta she said to refill in the 60 grams since she is using so much.

## 2022-03-01 ENCOUNTER — Ambulatory Visit: Payer: Medicare Other | Admitting: Physical Therapy

## 2022-03-07 NOTE — Therapy (Addendum)
OUTPATIENT PHYSICAL THERAPY VESTIBULAR TREATMENT     Patient Name: Rita Gregory MRN: [id number] DOB:1946/01/20, 76 y.o., female Today's Date: 03/08/2022   Progress Note Reporting Period 02/17/22 to 03/08/22  See note below for Objective Data and Assessment of Progress/Goals.      PT End of Session - 03/08/22 1618     Visit Number 4    Number of Visits 17    Date for PT Re-Evaluation 04/14/22    Authorization Type [facility]/Medicare/T[employer] for [employer]    PT Start Time 1535    PT Stop Time 1618    PT Time Calculation (min) 43 min    Equipment Utilized During Treatment Gait belt    Activity Tolerance Patient tolerated treatment well    Behavior During Therapy WFL for tasks assessed/performed                Past Medical History:  Diagnosis Date   ADD (attention deficit disorder)    Arthritis of left acromioclavicular joint    Asthma due to seasonal allergies    Cervical disc disorder with left arm radiculopathy of cervical region 04/20/2017   frequent headaches   Chronic pain    Dr. Ellene Route follows   Chronically dry eyes, bilateral    Complication of anesthesia    01-24-2022  pt stated and her husband (dr Tobe Sos) pt is  very sensitive to fentyl  and other anesthesia drugs,  states she needs to be breathing, fully awake ,  prior to being discharged   Congenital defect of septal closure    previously followed by dr h. Tamala Julian  (last visit approx 2011 )  per echo in care everywhere 10-18-2005 possible pfo,  cardiac MRI/ MRA 11-11-2005 no evidence for significant left-to-right or right-to-left shunting at rest and does not demonstrate atrial septal defect, normal left and right atrial size w/ interatrial septum is redundant & aneurymal   Diverticulosis of colon (without mention of hemorrhage) 11/09/2007   Environmental and seasonal allergies    Epigastric hernia    Fibromyalgia    GAD (generalized anxiety disorder)    GERD (gastroesophageal reflux disease)     Hashimoto's disease    endocrinologist--- dr Renne Crigler   History of gastric ulcer    1990s   History of hypertension    Hyperlipidemia    Idiopathic postprandial hypoglycemia    followed by dr Renne Crigler   Kyphoscoliosis    40 degree bend ro right side   MDD (major depressive disorder)    Paroxysmal spells 02/13/2013   suspect hypoglycemic   Pre-diabetes    endocrinologist--- dr Renne Crigler;    (01-24-2022  pt uses Libre 2,  stated fasting blood sugar average 100s -- 200s)   Rash    01-24-2022  pt reports currenly has rash under breast , this happens intermittantly   Vestibular migraine    neurologist--- dr d. patel   Wears glasses    Past Surgical History:  Procedure Laterality Date   ANTERIOR LATERAL LUMBAR FUSION 4 LEVELS Left 06/16/2015   Procedure: ANTERIOR LATERAL LUMBAR FUSION THORACIC TWELVE-LUMBAR FOUR;  Surgeon: Kristeen Miss, MD;  Location: [place] [facility];  Service: Neurosurgery;  Laterality: Left;  Thoracolumbar spine   CESAREAN SECTION     1972  and 1974   CHOLECYSTECTOMY, LAPAROSCOPIC  1992   COLONOSCOPY WITH ESOPHAGOGASTRODUODENOSCOPY (EGD)  03/23/2020   dr Eldridge Dace HERNIA REPAIR N/A 01/27/2022   Procedure: OPEN EPIGASTRIC HERNIA REPAIR WITH MESH PATCH;  Surgeon: Armandina Gemma, MD;  Location: [facility];  Service: General;  Laterality: N/A;   ESOPHAGOGASTRODUODENOSCOPY  02/23/2012   Procedure: ESOPHAGOGASTRODUODENOSCOPY (EGD);  Surgeon: Irene Shipper, MD;  Location: [facility];  Service: Endoscopy;  Laterality: N/A;   KNEE ARTHROSCOPY W/ ACL RECONSTRUCTION Right 2003   approx   KNEE ARTHROSCOPY W/ MENISCAL REPAIR Right 04/17/2006   _0  by dr Noemi Chapel   POSTERIOR LUMBAR FUSION 4 LEVEL N/A 06/16/2015   Procedure: POSTERIOR LUMBAR FUSION LUMBAR FOUR-FIVE LUMBAR FIVE-SACRAL ONE;  Surgeon: Kristeen Miss, MD;  Location: [place] [facility];  Service: Neurosurgery;  Laterality: N/A;   RADIOLOGY WITH ANESTHESIA N/A 06/15/2015   Procedure: RADIOLOGY WITH ANESTHESIA;   Surgeon: Medication Radiologist, MD;  Location: [facility];  Service: Radiology;  Laterality: N/A;   SHOULDER ARTHROSCOPY Left 04/28/2017   Procedure: ARTHROSCOPY SHOULDER WITH EXTENSIVE DEBRIDEMENT ROTATOR CUFF TEAR;  Surgeon: Elsie Saas, MD;  Location: [facility];  Service: Orthopedics;  Laterality: Left;   SHOULDER ARTHROSCOPY WITH ROTATOR CUFF REPAIR Right 06/05/2009   _1  by dr Noemi Chapel   TOTAL ABDOMINAL HYSTERECTOMY W/ BILATERAL SALPINGOOPHORECTOMY Bilateral 1988   per pt and Appendectomy   TOTAL KNEE ARTHROPLASTY Right 12/22/2008   @<[facility] by dr Grayce Sessions;    intraoperatively repair popliteal artery by dr fields   Patient Active Problem List   Diagnosis Date Noted   Epigastric hernia 01/23/2022   Weakness of extremity 08/24/2018   Dizzy spells 08/24/2018   Tunnel vision, bilateral 08/24/2018   Episodic recurrent vertigo 08/24/2018   Diabetes ([place]) 04/20/2017   Cervical disc disorder with left arm radiculopathy of cervical region 04/20/2017   Rotator cuff tear, non-traumatic, left    Shoulder impingement, left    Arthritis of left acromioclavicular joint    Foot pain, bilateral 10/04/2016   Chronic fatigue 05/06/2016   Menopausal symptoms 05/06/2016   Mixed dyslipidemia 05/06/2016   Osteoarthritis, generalized 05/06/2016   Primary hypothyroidism 05/06/2016   Vitamin D deficiency 05/06/2016   Hypertension, essential 05/06/2016   Impaired glucose tolerance 05/06/2016   Hypoalbuminemia due to protein-calorie malnutrition (HCC)    Degenerative lumbar spinal stenosis 06/22/2015   Weakness of both lower extremities    Ataxia    Gastroesophageal reflux disease without esophagitis    Constipation due to pain medication    S/P lumbar fusion    Lethargy    Postprocedural hypotension    Benign essential HTN    Fibromyalgia    Chronic pain syndrome    Post-operative pain    Respiratory depression    Acute blood loss anemia    Leukocytosis    Hypomagnesemia    Lumbar  spondylolysis    Intractable back pain 06/12/2015   Scoliosis of lumbar spine 06/12/2015   Paroxysmal spells 02/13/2013   GERD (gastroesophageal reflux disease) 02/16/2012   Diverticulosis 02/16/2012   Personal history of colonic polyps 02/16/2012   ARTHRITIS, RIGHT KNEE 03/27/2008   BUNIONS, BILATERAL 03/27/2008   FIBROMYALGIA 03/27/2008   FOOT PAIN, BILATERAL 03/27/2008   UNEQUAL LEG LENGTH 03/27/2008   CONGENITAL PES PLANUS 03/27/2008    PCP: Elby Showers, MD  REFERRING PROVIDER: Hansel Feinstein, PA-C  REFERRING DIAG: R42 (ICD-10-CM) - Dizziness and giddiness R53.81 (ICD-10-CM) - Other malaise M54.2 (ICD-10-CM) - Cervicalgia  THERAPY DIAG:  Dizziness and giddiness  Unsteadiness on feet  ONSET DATE: worsening in the past 4 months   Rationale for Evaluation and Treatment: Rehabilitation  SUBJECTIVE:   SUBJECTIVE STATEMENT: It's working. "Still having a lot of rebounding in my traps." Traveling over the  holiday bothered me. Need to take a short break from therapy in order to care for husband while he undergoes surgery.   Pt accompanied by: significant other  PERTINENT HISTORY: ADD, fibromyalgia, GAD, GERD, Hashimoto's, HTN, HLD, kyphoscoliosis, Idiopathic postprandial hypoglycemia, depression, pre-DM, vestibular migraine, anterior lateral fusion T12-L4, R TKA   PAIN:  Are you having pain? Yes: NPRS scale: 3-4/10 Pain location: all my joints  Pain description: achy Aggravating factors: cold weather Relieving factors: heat  PRECAUTIONS: Other: per MD note "avoid any abdominal exercises or core strengthening exercises for 1 more month" s/p hernia repair on 01/27/22  WEIGHT BEARING RESTRICTIONS: No  FALLS: Has patient fallen in last 6 months? Yes. Number of falls 3  LIVING ENVIRONMENT: Lives with: lives with their spouse Lives in: House/apartment Stairs:  couple stairs to enter; 2nd story Has following equipment at home: Single point cane, Environmental consultant - 2  wheeled, and Crutches; uses cane in the snow  PLOF: Independent; retired Therapist, sports  PATIENT GOALS: improve dizziness and balance  OBJECTIVE:     TODAY'S TREATMENT: 03/08/22 Activity Comments  Laruth Bouchard daroff 3x each  2-3/10 dizziness upon sitting up with PT's assist for speed; c/o head pressure; cueing to ground by placing feet on floor and holding onto mat   1/2 turns to targets with CGA Cueing to increase step length and height and speed of turns; imbalance and c/o 3/10 dizziness; latent onset of head pressure    standing wide EC foam 2x30" Mild-mod sway  Standing wide EO foam + head turns/nods 2x30" More instability with head turns   Sitting pencil pushups 5x ~6 inches until diplopia  STS EC 5x CGA, c/o jabbing in head   STS EO on foam  5x CGA; c/o wobbly  STM to B UT, cervical paraspinals, suboccpitals Tight and tender L>R           PATIENT EDUCATION: Education details: HEP update- to be performed in doorway only if she feels safe performing it at home Person educated: Patient and Spouse Education method: Explanation, Demonstration, Tactile cues, Verbal cues, and Handouts Education comprehension: verbalized understanding and returned demonstration   HOME EXERCISE PROGRAM Last updated: 03/08/22 Access Code: [facility] URL: [email or web address] Date: 03/08/2022 Prepared by: [facility]  Exercises - Seated Gaze Stabilization with Head Rotation  - 1 x daily - 5 x weekly - 2-3 sets - 30 hold - Seated Gaze Stabilization with Head Nod  - 1 x daily - 5 x weekly - 2-3 sets - 30 hold - Brandt-Daroff Vestibular Exercise  - 1 x daily - 5 x weekly - 2 sets - 3-5 reps - Romberg Stance  - 1 x daily - 5 x weekly - 2-3 sets - 30 sec  hold - Romberg Stance with Eyes Closed  - 1 x daily - 5 x weekly - 2-3 sets - 30 sec  hold - Seated Assisted Cervical Rotation with Towel  - 1 x daily - 5 x weekly - 2 sets - 10 reps - Cervical Retraction with  Overpressure  - 1 x daily - 5 x weekly - 2 sets - 10 reps - 3 sec hold - Gentle Levator Scapulae Stretch  - 1 x daily - 5 x weekly - 2 sets - 30 sec hold - 180 Degree Pivot Turn with Single Point Cane  - 1 x daily - 5 x weekly - 2 sets - 10 reps    Below measures were taken at time of initial  evaluation unless otherwise specified:   DIAGNOSTIC FINDINGS: none recent  COGNITION: Overall cognitive status: Within functional limits for tasks assessed; difficulty staying on task   SENSATION: Reports occasional N/T in B foot   POSTURE:  rounded shoulders and forward head  Cervical ROM:    Active A/PROM (deg) eval  Flexion   Extension   Right lateral flexion   Left lateral flexion   Right rotation   Left rotation   (Blank rows = not tested)   GAIT: Gait pattern:  slow and guarded gait; husbands assists Assistive device utilized: None Level of assistance: SBA    PATIENT SURVEYS:  FOTO next time   VESTIBULAR ASSESSMENT:  GENERAL OBSERVATION: pt wears progressive lenses at all time   OCULOMOTOR EXAM:  Ocular Alignment:  L eye exotropia  Ocular ROM: No Limitations  Spontaneous Nystagmus: absent  Gaze-Induced Nystagmus: absent  Smooth Pursuits: saccades in vertical direction  Saccades: hypometric/undershoots in vertical direction; c/o dizziness horizontal   Convergence/Divergence: 1.5 ft   VESTIBULAR - OCULAR REFLEX:   Slow VOR: Comment: c/o dizziness horizontal; slow in both planes   VOR Cancellation: Normal; mildly dizziness  Head-Impulse Test: HIT Right: negative HIT Left: negative Audible crepitus with L HIT- c/o "sharp zap" in neck       POSITIONAL TESTING:  Right Roll Test: negative Left Roll Test: negative Right Dix-Hallpike: negative; dizziness upon sitting up Left Dix-Hallpike: negative; dizziness upon sitting up  *reported delayed onset dizziness     VESTIBULAR TREATMENT:                                                                                                    DATE: 02/17/22    PATIENT EDUCATION: Education details: prognosis, POC, HEP; thorough edu on exam findings and answering patient/husband's questions  Person educated: Patient and Spouse Education method: Explanation, Demonstration, Tactile cues, Verbal cues, and Handouts Education comprehension: verbalized understanding and returned demonstration  HOME EXERCISE PROGRAM: Access Code: [facility] URL: [email or web address] Date: 02/17/2022 Prepared by: [facility]  Exercises - Seated Gaze Stabilization with Head Rotation  - 1 x daily - 5 x weekly - 2-3 sets - 30 hold - Seated Gaze Stabilization with Head Nod  - 1 x daily - 5 x weekly - 2-3 sets - 30 hold    GOALS: Goals reviewed with patient? Yes  SHORT TERM GOALS: Target date: 03/17/2022  Patient to be independent with initial HEP. Baseline: HEP initiated Goal status: MET 02/25/22    LONG TERM GOALS: Target date: 04/14/2022  Patient to be independent with advanced HEP. Baseline: Not yet initiated  Goal status: IN PROGRESS  Patient to report 0/10 dizziness with standing vertical and horizontal VOR for 30 seconds. Baseline: Unable Goal status: IN PROGRESS  Patient will report 0/10 dizziness with bed mobility.  Baseline: Symptomatic  Goal status: IN PROGRESS  Patient to demonstrate mild-moderate sway with M-CTSIB condition with eyes closed/foam surface in order to improve safety in environments with uneven surfaces and dim lighting. Baseline: mod-severe sway Goal status: IN PROGRESS  Patient to score  at least 20/24 on DGI in order to decrease risk of falls. Baseline: 13/24 Goal status: IN PROGRESS  Patient to report 75% improvement in dizziness.  Baseline: - Goal status: IN PROGRESS  Patient to score at least 56 on FOTO in order to indicate improved functional outcomes.  Baseline: 47 Goal status: IN PROGRESS     ASSESSMENT:  CLINICAL  IMPRESSION: Patient arrived to session with report of needing to take a short break from therapy to help care for her husband as he recovers from surgery. However reports continued improvement in dizziness. Able to tolerate habituation with mild dizziness. Able to initiate standing turns with hesitation and short steps; improved with cueing. Patient with frequent complaints of head pressure throughout session- tolerated STM to cervical musculature which was TTP and tight. Updated HEP with instruction on performing this safely- patient reported understanding and without complaints at end of session.      OBJECTIVE IMPAIRMENTS: Abnormal gait, decreased activity tolerance, decreased balance, difficulty walking, dizziness, increased muscle spasms, and postural dysfunction.   ACTIVITY LIMITATIONS: carrying, lifting, bending, sitting, standing, squatting, stairs, transfers, bed mobility, bathing, dressing, reach over head, and locomotion level  PARTICIPATION LIMITATIONS: meal prep, cleaning, laundry, driving, shopping, community activity, and church  PERSONAL FACTORS: Age, Fitness, Past/current experiences, Time since onset of injury/illness/exacerbation, and 3+ comorbidities: ADD, fibromyalgia, GAD, GERD, Hashimoto's, HTN, HLD, kyphoscoliosis, Idiopathic postprandial hypoglycemia, depression, pre-DM, vestibular migraine, anterior lateral fusion T12-L4, R TKA   are also affecting patient's functional outcome.   REHAB POTENTIAL: Good  CLINICAL DECISION MAKING: Evolving/moderate complexity  EVALUATION COMPLEXITY: Moderate   PLAN:  PT FREQUENCY: 2x/week  PT DURATION: 8 weeks  PLANNED INTERVENTIONS: Therapeutic exercises, Therapeutic activity, Neuromuscular re-education, Balance training, Gait training, Patient/Family education, Self Care, Joint mobilization, Stair training, Vestibular training, Canalith repositioning, Aquatic Therapy, Dry Needling, Electrical stimulation, Cryotherapy, Moist heat,  Taping, Manual therapy, and Re-evaluation  PLAN FOR NEXT SESSION: reassess HEP, progress habituation, slow gaze stabilization, multisensory balance training, convergence    Janene Harvey, PT, DPT 03/08/22 4:19 PM  [place] [facility] [address], [place], [place] [address] Phone # [phone] Fax # [phone]   PHYSICAL THERAPY DISCHARGE SUMMARY  Visits from Start of Care: 4  Current functional level related to goals / functional outcomes Unable to assess; pt did not return   Remaining deficits: Unable to assess   Education / Equipment: HEP  Plan: Patient agrees to discharge.  Patient goals were not met. Patient is being discharged due to not returning to PT.     Janene Harvey, PT, DPT 04/27/22 10:22 AM  [facility] at [facility] [address] [place], [place], [place] [address] Phone # [phone] Fax # [phone]

## 2022-03-08 ENCOUNTER — Ambulatory Visit: Payer: Medicare Other | Admitting: Physical Therapy

## 2022-03-08 ENCOUNTER — Encounter: Payer: Self-pay | Admitting: Physical Therapy

## 2022-03-08 DIAGNOSIS — R42 Dizziness and giddiness: Secondary | ICD-10-CM

## 2022-03-08 DIAGNOSIS — R2681 Unsteadiness on feet: Secondary | ICD-10-CM | POA: Diagnosis not present

## 2022-03-10 ENCOUNTER — Ambulatory Visit: Payer: Medicare Other | Admitting: Physical Therapy

## 2022-03-23 DIAGNOSIS — Z23 Encounter for immunization: Secondary | ICD-10-CM | POA: Diagnosis not present

## 2022-04-21 ENCOUNTER — Other Ambulatory Visit: Payer: Self-pay | Admitting: Internal Medicine

## 2022-04-22 DIAGNOSIS — G894 Chronic pain syndrome: Secondary | ICD-10-CM | POA: Diagnosis not present

## 2022-04-22 DIAGNOSIS — F900 Attention-deficit hyperactivity disorder, predominantly inattentive type: Secondary | ICD-10-CM | POA: Diagnosis not present

## 2022-05-17 ENCOUNTER — Telehealth: Payer: Self-pay | Admitting: Internal Medicine

## 2022-05-17 NOTE — Telephone Encounter (Signed)
Rita Gregory called and ask that all pharmacy's be removed from her profile except for [facility] on [address].

## 2022-05-17 NOTE — Telephone Encounter (Signed)
Rita Gregory [phone]  Rita Gregory would like to get a referral to [facility] for her [facility], it has been really bad for the last few weeks. Looks like the last time you seen her for this was 01/24/2022. Okay to put in referral?

## 2022-05-18 NOTE — Telephone Encounter (Signed)
Done

## 2022-05-18 NOTE — Telephone Encounter (Signed)
Called to let patient know the referral is in.

## 2022-05-31 ENCOUNTER — Other Ambulatory Visit: Payer: Medicare Other

## 2022-05-31 DIAGNOSIS — R7302 Impaired glucose tolerance (oral): Secondary | ICD-10-CM

## 2022-05-31 DIAGNOSIS — E782 Mixed hyperlipidemia: Secondary | ICD-10-CM

## 2022-05-31 DIAGNOSIS — I10 Essential (primary) hypertension: Secondary | ICD-10-CM

## 2022-05-31 DIAGNOSIS — E039 Hypothyroidism, unspecified: Secondary | ICD-10-CM

## 2022-06-03 ENCOUNTER — Ambulatory Visit: Payer: 59 | Admitting: Internal Medicine

## 2022-06-10 DIAGNOSIS — R519 Headache, unspecified: Secondary | ICD-10-CM | POA: Diagnosis not present

## 2022-06-10 DIAGNOSIS — H5500 Unspecified nystagmus: Secondary | ICD-10-CM | POA: Diagnosis not present

## 2022-06-10 DIAGNOSIS — R42 Dizziness and giddiness: Secondary | ICD-10-CM | POA: Diagnosis not present

## 2022-06-10 DIAGNOSIS — R296 Repeated falls: Secondary | ICD-10-CM | POA: Diagnosis not present

## 2022-06-10 DIAGNOSIS — G478 Other sleep disorders: Secondary | ICD-10-CM | POA: Diagnosis not present

## 2022-06-10 DIAGNOSIS — G8929 Other chronic pain: Secondary | ICD-10-CM | POA: Diagnosis not present

## 2022-06-10 DIAGNOSIS — Z79899 Other long term (current) drug therapy: Secondary | ICD-10-CM | POA: Diagnosis not present

## 2022-06-10 DIAGNOSIS — M549 Dorsalgia, unspecified: Secondary | ICD-10-CM | POA: Diagnosis not present

## 2022-06-10 DIAGNOSIS — F988 Other specified behavioral and emotional disorders with onset usually occurring in childhood and adolescence: Secondary | ICD-10-CM | POA: Diagnosis not present

## 2022-06-10 DIAGNOSIS — G4700 Insomnia, unspecified: Secondary | ICD-10-CM | POA: Diagnosis not present

## 2022-06-10 DIAGNOSIS — H538 Other visual disturbances: Secondary | ICD-10-CM | POA: Diagnosis not present

## 2022-06-14 DIAGNOSIS — L821 Other seborrheic keratosis: Secondary | ICD-10-CM | POA: Diagnosis not present

## 2022-06-14 DIAGNOSIS — L570 Actinic keratosis: Secondary | ICD-10-CM | POA: Diagnosis not present

## 2022-06-14 DIAGNOSIS — L309 Dermatitis, unspecified: Secondary | ICD-10-CM | POA: Diagnosis not present

## 2022-06-14 DIAGNOSIS — L304 Erythema intertrigo: Secondary | ICD-10-CM | POA: Diagnosis not present

## 2022-06-17 ENCOUNTER — Telehealth: Payer: Self-pay

## 2022-06-17 NOTE — Telephone Encounter (Signed)
Patient called asking if Dr. Renold Genta has received a request from Dr. Yong Channel at [facility] to place an order for patient MRI and CT w/ contrast and CT w/o contrast for vertigo.

## 2022-06-17 NOTE — Telephone Encounter (Signed)
Patient informed of message  

## 2022-07-12 ENCOUNTER — Ambulatory Visit (INDEPENDENT_AMBULATORY_CARE_PROVIDER_SITE_OTHER): Payer: Medicare Other | Admitting: Internal Medicine

## 2022-07-12 ENCOUNTER — Encounter: Payer: Self-pay | Admitting: Internal Medicine

## 2022-07-12 VITALS — BP 122/80 | HR 88 | Ht 59.5 in | Wt 182.2 lb

## 2022-07-12 DIAGNOSIS — E782 Mixed hyperlipidemia: Secondary | ICD-10-CM | POA: Diagnosis not present

## 2022-07-12 DIAGNOSIS — R7989 Other specified abnormal findings of blood chemistry: Secondary | ICD-10-CM | POA: Diagnosis not present

## 2022-07-12 DIAGNOSIS — R7303 Prediabetes: Secondary | ICD-10-CM | POA: Diagnosis not present

## 2022-07-12 DIAGNOSIS — E063 Autoimmune thyroiditis: Secondary | ICD-10-CM | POA: Diagnosis not present

## 2022-07-12 DIAGNOSIS — E038 Other specified hypothyroidism: Secondary | ICD-10-CM | POA: Diagnosis not present

## 2022-07-12 DIAGNOSIS — E673 Hypervitaminosis D: Secondary | ICD-10-CM

## 2022-07-12 LAB — TSH: TSH: 1.25 u[IU]/mL (ref 0.35–5.50)

## 2022-07-12 LAB — T4, FREE: Free T4: 0.98 ng/dL (ref 0.60–1.60)

## 2022-07-12 LAB — POCT GLYCOSYLATED HEMOGLOBIN (HGB A1C): Hemoglobin A1C: 5.9 % — AB (ref 4.0–5.6)

## 2022-07-12 LAB — VITAMIN D 25 HYDROXY (VIT D DEFICIENCY, FRACTURES): VITD: 77.11 ng/mL (ref 30.00–100.00)

## 2022-07-12 MED ORDER — LEVOTHYROXINE SODIUM 50 MCG PO TABS: ORAL_TABLET | ORAL | 3 refills | Status: DC

## 2022-07-12 MED ORDER — FREESTYLE LIBRE 3 SENSOR MISC: 1.0000 | 3 refills | Status: DC

## 2022-07-12 NOTE — Patient Instructions (Addendum)
Please continue Levothyroxine 50 mcg 6/7 days and 100 mcg 1/7 days.  Take the thyroid hormone every day, with water, in am, at least 30 minutes before breakfast, separated by at least 4 hours from: - acid reflux medications - calcium - iron - multivitamins  Please stop at the lab.  Please come back for a follow-up appointment in 6 months.

## 2022-07-12 NOTE — Progress Notes (Signed)
Patient ID: RUDINE GRANTHAM, female   DOB: 02-09-1946, 77 y.o.   MRN: [id number]  HPI: Dawnella Hapke is a 77 y.o. female, initially self-referred, returning for follow-up for prediabetes, since early 2000s, and suspected hypoglycemic spells, Hashimoto's hypothyroidism.  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 her husband, Dr. Tillman Sers, is also  my patient. She previously saw Dr. Elyse Hsu before he retired.  Last visit with me 6 months ago.  Interim history: She has headaches, significant vertigo.  No chest pain, shortness of breath. She was referred to [facility] since last visit (Dr. Yong Channel) for vertigo. She was recommended Propranolol - did not try this yet. She also had epigastric hernia surgery in 01/2022. She has hair loss. She increased her Synthroid to 2 tablets 1x a week. She had a period of lower blood sugars last month, in the 70s, but they improved.  She prefers to keep her blood sugars higher than 100 at all times.  Prediabetes:  Reviewed history: Patient describes that she has longstanding sensitivity to blood sugar variations even within the normal range.    She describes that after she had attached a freestyle libre CGM, she realized that her blood sugars did not actually drop as low as she thought when she was feeling hypoglycemic, however, she does describe significant hypoglycemic symptoms including slurring of words and disorientation when sugars go to 68-70.  When sugars get to around 80-90, she describes heartburn, vision changes, headaches, extreme fatigue, dizziness.  This happens mostly during the day, after meals.  She corrects them with 2 glucose tablets.  She also feels the needs to eat ice cream at night to raise her glucose around 115-120 when going to bed, to avoid lows overnight.  Before our visit in 12/2020 had to change her sensor targets from 90 to 85 and then to 80 as it was alarming her too much as she could not sleep.  Lowest blood  sugars: 68 >> 65 >> 70s >> >70 >> 75 Highest blood sugar: 130 >> 204 (Thanksgiving) >> 202-4 >> 209 >> 202.  Reviewed patient's HbA1c levels: Lab Results  Component Value Date   HGBA1C 6.0 (A) 01/07/2022   HGBA1C 6.0 (A) 08/05/2021   HGBA1C 5.8 (A) 01/07/2021   HGBA1C 6.2 (H) 11/03/2020   HGBA1C 5.8 (H) 11/22/2016   Patient was started on 500 mg of metformin ER by Dr. Elyse Hsu in 08/2020, but she stopped due to concern for hypoglycemia. Pt is currently not on any medications for her prediabetes.  Reviewed her sugars -she just changed her libre 2 sensor and we did not have a means to download her data since she uses a receiver and not her phone: - am: 100-123  Prev.:   Previously:   Pt's meals are: - Brunch: Half a cup of great grain cereal with 10 pecans and 10 for almonds, + half-and-half - Dinner: 4 to 6 ounces of meat or fish -usually present - Snacks: 1-2 -including ice cream at night  - no CKD; last BUN/creatinine:  Lab Results  Component Value Date   BUN 18 01/25/2022   BUN 15 05/21/2021   CREATININE 0.73 01/25/2022   CREATININE 0.83 05/21/2021  11/03/2020: ACR 15 She is not on ARB or ACE inhibitor.  -+ HL; last set of lipids: Lab Results  Component Value Date   CHOL 162 01/25/2022   HDL 56 01/25/2022   LDLCALC 73 01/25/2022   TRIG 252 (H) 01/25/2022   CHOLHDL 2.9 01/25/2022  On pravastatin  40 mg daily and Vascepa 2 g twice a day -started before our visit from 12/2020.   - last eye exam was in 2023. No DR. + dry macular degeneration, dry eyes.    - no numbness and tingling in her feet.  Latest foot exam 11/05/2020.   Pt has FH of DM in father - DM2.   Hypothyroidism:   Pt is on levothyroxine 50 mcg 6/7 days and 100 mcg 1/7 days, increase due to hair loss: (Generic did not work for her in the past) - at night - fasting - at least 30 min from b'fast - no calcium - no iron - + multivitamins - usually at night but occasionally in am - + PPIs at  night - not on Biotin  TFTs reviewed: Lab Results  Component Value Date   TSH 3.88 01/07/2022   TSH 1.60 08/05/2021   TSH 0.63 05/21/2021   TSH 1.13 02/18/2021   TSH 4.00 11/03/2020   TSH 2.17 11/22/2016   She also has a history of a tiny thyroid nodule: 08/30/2007: Thyroid ultrasound: Findings: Thyroid gland is lower limits of normal in size with the  right lobe measuring 3.3 cm long X 1.2 cm AP X 1.1 cm wide.  Left  lobe measures 3.9 cm long X 1.0 cm AP X 1.3 cm wide.  Isthmus  measures 2 mm AP.  Thyroid echotexture is homogeneous.  At the  medial mid left lobe the is cystic focus measuring 5 mm long X 3 mm  AP X 3 mm wide containing tiny anterior nonshadowing mural  echogenicity.     IMPRESSION:  1. Solitary 5 mm slightly complex cyst at the medial mid left lobe  thyroid favoring cystic adenoma.  2.  Otherwise, negative.    Pt denies: - feeling nodules in neck - hoarseness - dysphagia - choking  She has a h/o vitamin D deficiency but recent levels were high: Lab Results  Component Value Date   VD25OH >120 01/07/2022   VD25OH 98.09 08/05/2021   VD25OH 101 (H) 11/03/2020   VD25OH 69 11/22/2016  She was previously on ergocalciferol 50,000 units weekly.  I previously advised her to take vitamin D3 1000 units daily but she had ergocalciferol at home and switched to 50,000 units every 2 weeks.  At last visit, we stopped the vitamin D completely.  A B12 level was normal: Lab Results  Component Value Date   VITAMINB12 911 11/03/2020   She also has a history of: ADHD-on Adderall Chronic pain syndrome Chronic fatigue Fibromyalgia-sees rheumatology -on methylphenidate patch for this and for ADD GERD HTN Back pain and shoulder pain-status post spinal surgery - Dr. Ellene Route Osteoarthritis Migraines Dry eyes-on Shirley Friar and Systain - possibly Sjogren increased urination (has stress incontinence - stable)  ROS: + see HPI  Past Medical History:  Diagnosis Date   ADD  (attention deficit disorder)    Arthritis of left acromioclavicular joint    Asthma due to seasonal allergies    Cervical disc disorder with left arm radiculopathy of cervical region 04/20/2017   frequent headaches   Chronic pain    Dr. Ellene Route follows   Chronically dry eyes, bilateral    Complication of anesthesia    01-24-2022  pt stated and her husband (dr Tobe Sos) pt is  very sensitive to fentyl  and other anesthesia drugs,  states she needs to be breathing, fully awake ,  prior to being discharged   Congenital defect of septal closure    previously followed  by dr h. Tamala Julian  (last visit approx 2011 )  per echo in care everywhere 10-18-2005 possible pfo,  cardiac MRI/ MRA 11-11-2005 no evidence for significant left-to-right or right-to-left shunting at rest and does not demonstrate atrial septal defect, normal left and right atrial size w/ interatrial septum is redundant & aneurymal   Diverticulosis of colon (without mention of hemorrhage) 11/09/2007   Environmental and seasonal allergies    Epigastric hernia    Fibromyalgia    GAD (generalized anxiety disorder)    GERD (gastroesophageal reflux disease)    Hashimoto's disease    endocrinologist--- dr Renne Crigler   History of gastric ulcer    1990s   History of hypertension    Hyperlipidemia    Idiopathic postprandial hypoglycemia    followed by dr Renne Crigler   Kyphoscoliosis    40 degree bend ro right side   MDD (major depressive disorder)    Paroxysmal spells 02/13/2013   suspect hypoglycemic   Pre-diabetes    endocrinologist--- dr Renne Crigler;    (01-24-2022  pt uses Libre 2,  stated fasting blood sugar average 100s -- 200s)   Rash    01-24-2022  pt reports currenly has rash under breast , this happens intermittantly   Vestibular migraine    neurologist--- dr d. patel   Wears glasses    Past Surgical History:  Procedure Laterality Date   ANTERIOR LATERAL LUMBAR FUSION 4 LEVELS Left 06/16/2015   Procedure: ANTERIOR LATERAL LUMBAR FUSION  THORACIC TWELVE-LUMBAR FOUR;  Surgeon: Kristeen Miss, MD;  Location: [facility];  Service: Neurosurgery;  Laterality: Left;  Thoracolumbar spine   CESAREAN SECTION     1972  and 1974   CHOLECYSTECTOMY, LAPAROSCOPIC  1992   COLONOSCOPY WITH ESOPHAGOGASTRODUODENOSCOPY (EGD)  03/23/2020   dr Eldridge Dace HERNIA REPAIR N/A 01/27/2022   Procedure: OPEN EPIGASTRIC HERNIA REPAIR WITH MESH PATCH;  Surgeon: Armandina Gemma, MD;  Location: [facility];  Service: General;  Laterality: N/A;   ESOPHAGOGASTRODUODENOSCOPY  02/23/2012   Procedure: ESOPHAGOGASTRODUODENOSCOPY (EGD);  Surgeon: Irene Shipper, MD;  Location: [facility];  Service: Endoscopy;  Laterality: N/A;   KNEE ARTHROSCOPY W/ ACL RECONSTRUCTION Right 2003   approx   KNEE ARTHROSCOPY W/ MENISCAL REPAIR Right 04/17/2006   @[facility]  by dr Noemi Chapel   POSTERIOR LUMBAR FUSION 4 LEVEL N/A 06/16/2015   Procedure: POSTERIOR LUMBAR FUSION LUMBAR FOUR-FIVE LUMBAR FIVE-SACRAL ONE;  Surgeon: Kristeen Miss, MD;  Location: [facility];  Service: Neurosurgery;  Laterality: N/A;   RADIOLOGY WITH ANESTHESIA N/A 06/15/2015   Procedure: RADIOLOGY WITH ANESTHESIA;  Surgeon: Medication Radiologist, MD;  Location: [facility];  Service: Radiology;  Laterality: N/A;   SHOULDER ARTHROSCOPY Left 04/28/2017   Procedure: ARTHROSCOPY SHOULDER WITH EXTENSIVE DEBRIDEMENT ROTATOR CUFF TEAR;  Surgeon: Elsie Saas, MD;  Location: [facility];  Service: Orthopedics;  Laterality: Left;   SHOULDER ARTHROSCOPY WITH ROTATOR CUFF REPAIR Right 06/05/2009   @[facility]  by dr Noemi Chapel   TOTAL ABDOMINAL HYSTERECTOMY W/ BILATERAL SALPINGOOPHORECTOMY Bilateral 1988   per pt and Appendectomy   TOTAL KNEE ARTHROPLASTY Right 12/22/2008   @[facility] by dr Grayce Sessions;    intraoperatively repair popliteal artery by dr fields   Social History   Socioeconomic History   Marital status: Married    Spouse name: Legrand Como   Number of children: Not on file   Years of education: Not on file    Highest education level: Not on file  Occupational History   Occupation: Therapist, sports  Employer: [employer]  Tobacco Use   Smoking status: Never    Passive exposure: Yes   Smokeless tobacco: Never   Tobacco comments:    Flight attentent for 7 years. [id number] Passive smoker  Vaping Use   Vaping Use: Never used  Substance and Sexual Activity   Alcohol use: Not Currently    Comment: Very rarely    Drug use: Never   Sexual activity: Yes    Partners: Male    Birth control/protection: Surgical  Other Topics Concern   Not on file  Social History Narrative   Husband is Dr. Tillman Sers       Right Handed    Lives in a three story home    Social Determinants of Health   Financial Resource Strain: Not on file  Food Insecurity: Not on file  Transportation Needs: Not on file  Physical Activity: Not on file  Stress: Not on file  Social Connections: Not on file  Intimate Partner Violence: Not on file   Current Outpatient Medications on File Prior to Visit  Medication Sig Dispense Refill   acetaminophen (TYLENOL) 650 MG CR tablet Take 650 mg by mouth every 8 (eight) hours as needed for pain.     albuterol (VENTOLIN HFA) 108 (90 Base) MCG/ACT inhaler Inhale 2 puffs into the lungs every 4 (four) hours as needed for wheezing 18 g 5   ALPRAZolam (XANAX) 0.5 MG tablet One half to one tablet twice daily as needed for vertigo 60 tablet 0   amoxicillin (AMOXIL) 500 MG capsule Take 4 capsules (2,000 mg total) by mouth as directed 1 hour prior to treatment 16 capsule 1   benzonatate (TESSALON) 100 MG capsule Take 1-2 capsules (100-200 mg total) by mouth 3 (three) times daily as needed for cough. 60 capsule 0   conjugated estrogens (PREMARIN) vaginal cream Place 0.625 mg vaginally daily as needed.      Continuous Blood Gluc Receiver (FREESTYLE LIBRE 2 READER) DEVI Use as instructed to check blood sugar 1 each 0   Continuous Blood Gluc Sensor (DEXCOM G7 SENSOR) MISC Apply 1 sensor every 10 days 9  each 4   Continuous Blood Gluc Sensor (FREESTYLE LIBRE 2 SENSOR) MISC Inject into skin every 14 days as directed for glucose monitoring 4 each 12   Continuous Blood Gluc Sensor (FREESTYLE LIBRE 2 SENSOR) MISC Inject into the skin every 14 days. Use as directed for 14 days per sensor for glucose monitoring 12 each 4   Cyanocobalamin (B-12 PO) Take by mouth at bedtime.     EPINEPHrine 0.3 mg/0.3 mL IJ SOAJ injection Use as directed for severe allergic reaction (Patient taking differently: Inject 0.3 mg into the muscle as needed.) 2 each 6   estrogens, conjugated, (PREMARIN) 0.45 MG tablet Take 1 tablet (0.45 mg total) by mouth daily. (Patient taking differently: Take 0.45 mg by mouth at bedtime.) 90 tablet 3   glucose blood test strip Use to test blood sugar 2 times daily 100 each 8   HYDROcodone bit-homatropine (HYCODAN) 5-1.5 MG/5ML syrup Take 5 mLs by mouth every 8 (eight) hours as needed for cough. 120 mL 0   hydrocortisone 2.5 % cream as needed.     icosapent Ethyl (VASCEPA) 1 g capsule Take 2 capsules (2 g total) by mouth 2 (two) times daily with a meal. (Patient taking differently: Take 2 g by mouth at bedtime.) 360 capsule 4   ketoconazole (NIZORAL) 2 % cream APPLY 1 APPLICATION TOPICALLY 2 TIMES DAILY AS NEEDED  FOR IRRITATION. 15 g 3   Lancets (FREESTYLE) lancets Use to test blood sugar twice a day 200 each 4   LEXAPRO 20 MG tablet Take 1 tablet (20 mg total) by mouth daily. (Patient taking differently: Take 10 mg by mouth 2 (two) times daily.) 90 tablet 0   Lifitegrast (XIIDRA) 5 % SOLN Place 1 drop into both eyes 2 (two) times daily. (Patient taking differently: Place 1 drop into both eyes 2 (two) times daily.) 900 each 4   loratadine (CLARITIN) 10 MG tablet Take 10 mg by mouth daily as needed for allergies.     meclizine (ANTIVERT) 25 MG tablet Take 50-75 mg by mouth 3 (three) times daily as needed for dizziness.     methylphenidate (DAYTRANA) 10 mg/9hr patch Place 1 patch (10 mg total)  onto the skin daily in the afternoon as needed. 01/06/22 30 patch 0   methylphenidate (DAYTRANA) 15 mg/9hr Place 1 patch (15 mg total) onto the skin daily. 01/06/22 30 patch 0   miconazole (MONISTAT 7) 2 % vaginal cream Use as needed     Multiple Vitamins-Minerals (CENTRUM SILVER PO) Take 1 tablet by mouth at bedtime. CENTRUM SILVER GUMMY ONE A DAY     oxymetazoline (AFRIN) 0.05 % nasal spray Place into both nostrils 2 (two) times daily as needed.     pantoprazole (PROTONIX) 40 MG tablet Take 1 tablet (40 mg total) by mouth daily. (Patient taking differently: Take 40 mg by mouth at bedtime.) 90 tablet 3   Polyvinyl Alcohol-Povidone (REFRESH OP) Apply to eye as needed.     pravastatin (PRAVACHOL) 40 MG tablet Take 1 tablet (40 mg total) by mouth daily. (Patient taking differently: Take 40 mg by mouth at bedtime.) 90 tablet 3   Pyridoxine HCl (B-6 PO) Take 1 capsule by mouth at bedtime.     silver sulfADIAZINE (SILVADENE) 1 % cream APPLY TO THE AFFECTED AREA(S) AS NEEDED FOR BURN AS DIRECTED 25 g 2   SYNTHROID 50 MCG tablet Take 1 tablet (50 mcg total) by mouth daily before breakfast. (Patient taking differently: Take 50 mcg by mouth daily before breakfast.) 90 tablet 3   testosterone (ANDROGEL) 50 MG/5GM (1%) GEL      traMADol (ULTRAM) 50 MG tablet Take 1-2 tablets (50-100 mg total) by mouth every 6 (six) hours as needed for moderate pain. 15 tablet 0   traZODone (DESYREL) 50 MG tablet Take 1-2 tablets (50-100 mg total) by mouth at bedtime as needed for sleep (Patient taking differently: Take 50-100 mg by mouth at bedtime.) 180 tablet 0   triamcinolone (KENALOG) 0.1 % Apply topically in the morning and at bedtime.     No current facility-administered medications on file prior to visit.   Allergies  Allergen Reactions   Betadine [Povidone Iodine] Other (See Comments)    Blister hands and feet     Fentanyl Shortness Of Breath    Pt has tolerated this medication 06/2015 pre and during operation. MD  ok giving.  Low dose only   Morphine And Related Anaphylaxis    respiratory depression    Bupropion Hcl     Dizzy, fog   Codeine Nausea And Vomiting   Demerol [Meperidine] Nausea And Vomiting   Dextromethorphan Diarrhea   Guaifenesin & Derivatives Diarrhea   Lipitor [Atorvastatin]     Myalgia   Lovaza [Omega-3-Acid Ethyl Esters] Diarrhea   Lyrica [Pregabalin]     Fatigue and depression   Naltrexone     depression   Percodan [Oxycodone-Aspirin]  Nausea And Vomiting   Polyethylene Glycol     Blurred vision, headache, and causes bright red skin irritation if on skin.   Prilosec [Omeprazole]     Hx ileus   Topamax [Topiramate]     disorientation    Valium [Diazepam]     Small doses are OK but large doses are not. Long-term use causes depression.    Versed [Midazolam]     In moderate in large doses shortness of breath, and respiratory depression    Adhesive [Tape] Rash    Skin irritation    Concerta [Methylphenidate] Palpitations   Doxycycline Rash    Pruritic rash   Lamotrigine Rash   Nickel Rash    Skin irritation    Oxycodone Nausea And Vomiting and Rash    And depression   Family History  Problem Relation Age of Onset   Ovarian cancer Mother        passed at age 40   Hypertension Mother    Heart disease Father    Kidney disease Father        Kidney cancer   Diabetes type II Father        passed at 105 yrs old   Colon polyps Father    Myasthenia gravis Father        passed from [place] gravis   Bipolar disorder Sister        Osteopenia   Bipolar disorder Son    Esophageal cancer Neg Hx    Rectal cancer Neg Hx    Stomach cancer Neg Hx    Colon cancer Neg Hx    PE: There were no vitals filed for this visit.  Wt Readings from Last 3 Encounters:  01/27/22 180 lb 3.2 oz (81.7 kg)  01/24/22 180 lb (81.6 kg)  01/07/22 179 lb (81.2 kg)    Constitutional: overweight, in NAD Eyes:  EOMI, no exophthalmos ENT: no neck masses, no cervical  lymphadenopathy Cardiovascular: RRR, No MRG, + mild periankle edema bilaterally Respiratory: CTA B Musculoskeletal: no deformities Skin:no rashes Neurological: no tremor with outstretched hands  ASSESSMENT: 1.  Prediabetes   2.  Idiopathic postprandial syndrome   3.  Hyperlipidemia  4.  Hashimoto's hypothyroidism  5.  High Vitamin D    PLAN:  1. Patient with increased glucose tolerance since 2002, with HbA1c levels in the prediabetic range.  Latest HbA1c was 6.0%, excellent.  Her highest HbA1c obtained so far was 6.2%. -She tried metformin before but she stopped due to presumed hypoglycemic episodes.  She started a CGM in 2021 and she mentioned that this was an eye opener and demonstrated that her episodes of hypoglycemic symptoms were not, in fact, related to low blood sugars.  When she saw Dr. Elyse Hsu for the last time in 08/2020, she was advised to resume the CGM.  She is paying out-of-pocket for this.  -At last visit, reviewing her CGM trends, sugars are excellent throughout the day.  She had only 1 instance in the previous 2 weeks on her blood sugars after lunch increased to 209, but then improved to baseline.  We did not start any medication at that time. -At today's visit we could not download the CGM trends.  However, she mentions that sugars remain well-controlled, except for a period of time last month when they were staying in the 70s and she had to correct them frequently.  She prefers to keep them in the 100-120 range - we checked her HbA1c: 5.9% (at goal) - advised to  check sugars at different times of the day - 4x a day, rotating check times - given a freestyle libre 3 sensor and sent the prescription to her pharmacy.  She pays for them out-of-pocket. - return to clinic in 6 months  2.  Idiopathic postprandial syndrome -Associated with spells with weakness, dizziness, burning sensation in her stomach, resolved after taking glucose -However, these episodes are not  associated with low blood sugars.  She occasionally has dizzy spells occurring when her blood sugars are at 90, and she eats to prevent this to happen.  She may have neurologic symptoms when her sugars drop under 70.  We discussed that since these are normal sugars, it appears that she may have just an increase sensitivity to the fluctuations in the blood sugars.  Of note, she saw neurology in the past and no etiology was found for these episodes. MRI was normal.   -No medication is available for this condition, however, we discussed about dietary changes that she can institute to prevent large fluctuations in blood sugars: Eating low glycemic index foods, mostly focusing on solid foods, rather than semisolid or liquid, no  liquids at the time of the meal but separate them by at least 30 minutes from the meal, and start the meal with protein and fat and end with carbs.  -She already has glucose tablets at hand and takes 1-2 when she feels that her sugars are dropping -We previously discussed that her freestyle libre CGM may indicate blood sugars that are 10 to 20 mg/dL lower than actually measured by her glucometer.  Therefore, whenever she suspects a low, she needs to check with her glucometer to make sure that these are real levels. -She has an Visual merchandiser    3.  Hyperlipidemia -Regular lipid panel from 01/2022: Fractions at goal with the exception of a high triglyceride level: Lab Results  Component Value Date   CHOL 162 01/25/2022   HDL 56 01/25/2022   LDLCALC 73 01/25/2022   TRIG 252 (H) 01/25/2022   CHOLHDL 2.9 01/25/2022  -She continues on pravastatin 40 mg daily and Vascepa 2 g twice a day, started after the above results returned.  She tolerates these well.  4.  Hashimoto's hypothyroidism - latest thyroid labs reviewed with pt. >> normal: Lab Results  Component Value Date   TSH 3.88 01/07/2022  - she continues on LT4 (now generic) 50 mcg 6/7 days but increase the dose to 100 mcg 1/7  days due to hair loss.  Her hair loss improved slightly afterwards. - pt feels good on this dose. - we discussed about taking the thyroid hormone every day, with water, >30 minutes before breakfast, separated by >4 hours from acid reflux medications, calcium, iron, multivitamins. Pt. is taking it correctly. - will check thyroid tests today: TSH and fT4 - If labs are abnormal, she will need to return for repeat TFTs in 1.5 months - RTC in 6 mo  5.  High vitamin D level -She had 2 instances of high vitamin D level, at 100 in 10/2020 and >120 in 12/2021 -At last visit, she was taking 50,000 units of ergocalciferol every 2 weeks, did not switch to 1000 units vitamin D daily yet -Her vitamin D level returned elevated as follows: Lab Results  Component Value Date   VD25OH >120 01/07/2022  -I advised her to stop vitamin D completely and to come back for labs but she did not have this yet -Today's visit, she continues on vitamin D  only from multivitamins. -Will recheck today  Needs refills of LT4.  Component     Latest Ref Rng 07/12/2022  Hemoglobin A1C     4.0 - 5.6 % 5.9 !   TSH     0.35 - 5.50 uIU/mL 1.25   T4,Free(Direct)     0.60 - 1.60 ng/dL 0.98   VITD     30.00 - 100.00 ng/mL 77.11   Vitamin D level improved. will continue vitamin D only from multivitamins. Thyroid tests are excellent.  My suggestion would be to continue the current levothyroxine dose.  Philemon Kingdom, MD PhD [facility] Endocr[facility]

## 2022-07-31 DIAGNOSIS — R42 Dizziness and giddiness: Secondary | ICD-10-CM | POA: Diagnosis not present

## 2022-08-12 DIAGNOSIS — R42 Dizziness and giddiness: Secondary | ICD-10-CM | POA: Diagnosis not present

## 2022-08-30 ENCOUNTER — Telehealth: Payer: Self-pay

## 2022-08-30 ENCOUNTER — Other Ambulatory Visit (HOSPITAL_COMMUNITY): Payer: Self-pay

## 2022-08-30 DIAGNOSIS — E119 Type 2 diabetes mellitus without complications: Secondary | ICD-10-CM | POA: Diagnosis not present

## 2022-08-30 DIAGNOSIS — H5203 Hypermetropia, bilateral: Secondary | ICD-10-CM | POA: Diagnosis not present

## 2022-08-30 LAB — HM DIABETES EYE EXAM

## 2022-08-30 NOTE — Telephone Encounter (Signed)
Patient Advocate Encounter   Received notification from [facility] that prior authorization is required for [facility] 3 sensor  Submitted: 08/30/2022 [facility]  Status is pending

## 2022-08-31 ENCOUNTER — Encounter: Payer: Self-pay | Admitting: Internal Medicine

## 2022-09-07 NOTE — Telephone Encounter (Signed)
PA has been DENIED.   Denial letter has been attached in patients media. 

## 2022-09-08 NOTE — Telephone Encounter (Signed)
Yes.   Per PA form:    Documentation was submitted. However, when they ask this, they are usually looking for an A1C above 6.5, which it doesn't look like she has had in the last 5 years. I am assuming that is why they said there was no documentation to confirm diagnosis

## 2022-09-27 ENCOUNTER — Other Ambulatory Visit: Payer: Medicare Other

## 2022-09-27 DIAGNOSIS — E559 Vitamin D deficiency, unspecified: Secondary | ICD-10-CM

## 2022-09-27 DIAGNOSIS — Z1329 Encounter for screening for other suspected endocrine disorder: Secondary | ICD-10-CM

## 2022-09-27 DIAGNOSIS — E119 Type 2 diabetes mellitus without complications: Secondary | ICD-10-CM

## 2022-09-27 DIAGNOSIS — E782 Mixed hyperlipidemia: Secondary | ICD-10-CM

## 2022-09-27 DIAGNOSIS — I10 Essential (primary) hypertension: Secondary | ICD-10-CM

## 2022-09-27 NOTE — Progress Notes (Signed)
Annual Wellness Visit    Patient Care Team: Pinchus Weckwerth, Luanna Cole, MD as PCP - General (Internal Medicine) Glendale Chard, DO as Consulting Physician (Neurology)  Visit Date: 10/04/22   Chief Complaint  Patient presents with   Medicare Wellness    Subjective:   Patient: Rita Gregory, Female    DOB: [birth year], 77 y.o.   MRN: [id number]  Rita Gregory is a 76 y.o. Female who presents today for her Annual Wellness Visit.She also presents for health maintenance exam and evaluation of multiple medical issues.  History of vertigo treated with alprazolam 0.5-1 mg twice daily as needed. Continues to have episodes of vertigo and headaches, difficulty with balance. 2020 MRI brain showed no acute findings.  History of depression treated with Lexapro 10 mg twice daily.  History of insomnia treated with trazodone 50-100 mg at bedtime.  History of hyperlipidemia treated with pravastatin 40 mg at bedtime. TRIG elevated at 391.  History of glucose intolerance with HGBA1c at 6.3% on 09/29/22, up from 5.9% on 07/12/22.  Reports experiencing increased trouble with eczematous rash and would like to see a Dermatologist.  History of musculoskeletal pain treated with tramadol 50-100 mg every 6 hours as needed. 01/22/21 MRI C spine showed, 1) C3-C4 moderate right neural foraminal narrowing, which appears similar to the prior exam, 2) C4-C5 moderate left neural foraminal narrowing, which appears similar to the prior exam, 3) No spinal canal stenosis.  History of hypothyroidism treated with levothyroxine 50 mcg 6 days weekly and 100 mcg one day weekly. TSH is at 2.34.  She is married to Dr. Molli Knock who is an Endocrinologist now working at the [employer] in [place] and formerly was  with [employer]. 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 She is now retired.   Rita Gregory has a history of osteoarthritis, fatigue, fibromyalgia syndrome, migraine  headaches.  Patient has history of stress urinary incontinence.  She had a hysterectomy in 1988.  Menarche was at age 8.  She has had a total right knee arthroplasty and rotator cuff surgery.  History of torn right ACL in 1985.  History of scoliosis and lumbar fusion.  Failed bilateral rotator cuff surgery.  2 pregnancies and no miscarriages.  Dr. Danielle Dess is her Neurosurgeon.Hx of scoliosis and thoracic spondylosis with myelopathy.   Sees Dr. Jerolyn Shin in [place] for ADD.   Dr. Sinda Du is Ophthalmologist.  Dr. Gerald Leitz is GYN.  Patient used the Daytrana (methylphenidate) patch for attention deficit.  Takes pantoprazole 40 mg daily for GE reflux symptoms.   Uses topical Voltaren on her knees for osteoarthritis.    Her vitamin D level is high at 101.  She takes vitamin D weekly.  Free T4 is normal at 1.1.  B12 normal.  Ferritin is low normal at 18.  CBC is normal.  She is not anemic.  She requested a C-reactive protein and it was slightly elevated at 16.6.  Normal is less then 8.0 her hemoglobin A1c is excellent at 6.2%.  Fasting glucose is 103.  BUN and creatinine are normal and her estimated GFR is 92 cc/min.  Electrolytes are normal as are serum proteins.  Liver functions are normal.  Total cholesterol is 172, HDL 67, triglycerides 201 and LDL cholesterol 75.  Her TSH is 4.    Glucose elevated at 117. Kidney, liver functions normal. Electrolytes normal. Blood proteins normal. CBC normal.   Mammogram last completed  06/15/21. No mammographic evidence of malignancy. Recommended repeat in 11/01/22.  Dr. Yancey Flemings is her Gastroenterologist. Colonoscopy and upper endoscopy done Dec 2021. Sessile serrated polyp and hyperplastic polyp removed.  Social history: Married.  She has 2 sons ages 9 and 49.  Younger son has history of bipolar disorder which is well controlled.  Oldest son is in excellent health.  Family history: Sister age 79 with history of bipolar disorder.  1 brother  in good health.  Father died in 11/01/11 with history of cardiovascular disease and myasthenia gravis.  Mother died in 67 with ovarian cancer with history of hypertension.  Past Medical History:  Diagnosis Date   ADD (attention deficit disorder)    Arthritis of left acromioclavicular joint    Asthma due to seasonal allergies    Cervical disc disorder with left arm radiculopathy of cervical region 04/20/2017   frequent headaches   Chronic pain    Dr. Danielle Dess follows   Chronically dry eyes, bilateral    Complication of anesthesia    01-24-2022  pt stated and her husband (dr Fransico Michael) pt is  very sensitive to fentyl  and other anesthesia drugs,  states she needs to be breathing, fully awake ,  prior to being discharged   Congenital defect of septal closure    previously followed by dr h. Katrinka Blazing  (last visit approx 10/31/09 )  per echo in care everywhere 10/31/2005 possible pfo,  cardiac MRI/ MRA 11-11-2005 no evidence for significant left-to-right or right-to-left shunting at rest and does not demonstrate atrial septal defect, normal left and right atrial size w/ interatrial septum is redundant & aneurymal   Diverticulosis of colon (without mention of hemorrhage) 11/09/2007   Environmental and seasonal allergies    Epigastric hernia    Fibromyalgia    GAD (generalized anxiety disorder)    GERD (gastroesophageal reflux disease)    Hashimoto's disease    endocrinologist--- dr Lafe Garin   History of gastric ulcer    1990s   History of hypertension    Hyperlipidemia    Idiopathic postprandial hypoglycemia    followed by dr Lafe Garin   Kyphoscoliosis    40 degree bend ro right side   MDD (major depressive disorder)    Paroxysmal spells 02/13/2013   suspect hypoglycemic   Pre-diabetes    endocrinologist--- dr Lafe Garin;    (01-24-2022  pt uses Libre 2,  stated fasting blood sugar average 100s -- 200s)   Rash    01-24-2022  pt reports currenly has rash under breast , this happens intermittantly    Vestibular migraine    neurologist--- dr d. patel   Wears glasses      Family History  Problem Relation Age of Onset   Ovarian cancer Mother        passed at age 46   Hypertension Mother    Heart disease Father    Kidney disease Father        Kidney cancer   Diabetes type II Father        passed at 78 yrs old   Colon polyps Father    Myasthenia gravis Father        passed from Montserrat gravis   Bipolar disorder Sister        Osteopenia   Bipolar disorder Son    Esophageal cancer Neg Hx    Rectal cancer Neg Hx    Stomach cancer Neg Hx    Colon cancer Neg Hx      Social History  Social History Narrative   Husband is Dr. Molli Knock       Right Handed    Lives in a three story home      Review of Systems  Constitutional:  Negative for chills, fever, malaise/fatigue and weight loss.  HENT:  Negative for congestion, hearing loss, sinus pain and sore throat.   Eyes:  Negative for blurred vision.  Respiratory:  Negative for cough, hemoptysis and shortness of breath.   Cardiovascular:  Negative for chest pain, palpitations, leg swelling and PND.  Gastrointestinal:  Negative for abdominal pain, constipation, diarrhea, heartburn, nausea and vomiting.  Genitourinary:  Negative for dysuria, frequency and urgency.  Musculoskeletal:  Negative for back pain, myalgias and neck pain.  Skin:  Positive for rash. Negative for itching.  Neurological:  Positive for dizziness and headaches. Negative for tingling, seizures and loss of consciousness.  Endo/Heme/Allergies:  Negative for polydipsia.  Psychiatric/Behavioral:  Negative for depression. The patient is not nervous/anxious.       Objective:   Vitals: BP 108/70   Pulse 76   Ht 4' 10.75" (1.492 m)   Wt 181 lb 12 oz (82.4 kg)   SpO2 96%   BMI 37.02 kg/m   Physical Exam Vitals and nursing note reviewed.  Constitutional:      General: She is not in acute distress.    Appearance: Normal appearance. She is not  ill-appearing or toxic-appearing.  HENT:     Head: Normocephalic and atraumatic.     Right Ear: Hearing, tympanic membrane, ear canal and external ear normal.     Left Ear: Hearing, tympanic membrane, ear canal and external ear normal.     Mouth/Throat:     Pharynx: Oropharynx is clear.  Eyes:     Extraocular Movements: Extraocular movements intact.     Pupils: Pupils are equal, round, and reactive to light.  Neck:     Thyroid: No thyroid mass, thyromegaly or thyroid tenderness.     Vascular: No carotid bruit.  Cardiovascular:     Rate and Rhythm: Normal rate and regular rhythm. No extrasystoles are present.    Pulses:          Dorsalis pedis pulses are 1+ on the right side and 1+ on the left side.     Heart sounds: Normal heart sounds. No murmur heard.    No friction rub. No gallop.  Pulmonary:     Effort: Pulmonary effort is normal.     Breath sounds: Normal breath sounds. No decreased breath sounds, wheezing, rhonchi or rales.  Chest:     Chest wall: No mass.  Abdominal:     Palpations: Abdomen is soft. There is no hepatomegaly, splenomegaly or mass.     Tenderness: There is no abdominal tenderness.     Hernia: No hernia is present.  Musculoskeletal:     Cervical back: Normal range of motion.     Right lower leg: No edema.     Left lower leg: No edema.  Lymphadenopathy:     Cervical: No cervical adenopathy.     Upper Body:     Right upper body: No supraclavicular adenopathy.     Left upper body: No supraclavicular adenopathy.  Skin:    General: Skin is warm and dry.  Neurological:     General: No focal deficit present.     Mental Status: She is alert and oriented to person, place, and time. Mental status is at baseline.     Sensory: Sensation is intact.  Motor: Motor function is intact. No weakness.     Deep Tendon Reflexes: Reflexes are normal and symmetric.  Psychiatric:        Attention and Perception: Attention normal.        Mood and Affect: Mood normal.         Speech: Speech normal.        Behavior: Behavior normal.        Thought Content: Thought content normal.        Cognition and Memory: Cognition normal.        Judgment: Judgment normal.      Most recent functional status assessment:    01/27/2022    7:17 AM  In your present state of health, do you have any difficulty performing the following activities:  Hearing? 0  Vision? 1  Comment Sometimes per patient  Difficulty concentrating or making decisions? 0  Walking or climbing stairs? 1  Dressing or bathing? 0   Most recent fall risk assessment:    10/04/2022    3:24 PM  Fall Risk   Falls in the past year? 1  Number falls in past yr: 1  Injury with Fall? 0  Risk for fall due to : History of fall(s);Impaired balance/gait  Follow up Falls evaluation completed;Education provided;Falls prevention discussed    Most recent depression screenings:    01/24/2022    3:33 PM 01/07/2022    9:11 AM  PHQ 2/9 Scores  PHQ - 2 Score 0 0   Most recent cognitive screening:    10/04/2022    3:27 PM  6CIT Screen  What Year? 0 points  What month? 0 points  What time? 0 points  Count back from 20 0 points  Months in reverse 0 points  Repeat phrase 0 points  Total Score 0 points     Results:   Studies obtained and personally reviewed by me:  Mammogram last completed 06/15/21. No mammographic evidence of malignancy. Recommended repeat in 2024.  Dr. Yancey Flemings is her Gastroenterologist. Colonoscopy and upper endoscopy done Dec 2021. Sessile serrated polyp and hyperplastic polyp removed.  Labs:       Component Value Date/Time   NA 139 09/29/2022 1121   K 4.7 09/29/2022 1121   CL 101 09/29/2022 1121   CO2 28 09/29/2022 1121   GLUCOSE 117 (H) 09/29/2022 1121   BUN 20 09/29/2022 1121   CREATININE 0.71 09/29/2022 1121   CALCIUM 10.0 09/29/2022 1121   PROT 7.1 09/29/2022 1121   ALBUMIN 4.2 11/22/2016 1249   AST 19 09/29/2022 1121   ALT 10 09/29/2022 1121   ALKPHOS 57  11/22/2016 1249   BILITOT 0.3 09/29/2022 1121   GFRNONAA 87 11/22/2016 1249   GFRAA >89 11/22/2016 1249     Lab Results  Component Value Date   WBC 6.9 09/29/2022   HGB 13.2 09/29/2022   HCT 40.3 09/29/2022   MCV 87.4 09/29/2022   PLT 290 09/29/2022    Lab Results  Component Value Date   CHOL 193 09/29/2022   HDL 59 09/29/2022   LDLCALC 82 09/29/2022   TRIG 391 (H) 09/29/2022   CHOLHDL 3.3 09/29/2022    Lab Results  Component Value Date   HGBA1C 6.3 (H) 09/29/2022     Lab Results  Component Value Date   TSH 2.34 09/29/2022    Assessment & Plan:   Hyperlipidemia: taking prevastatin 40 mg at bedtime. TRIG elevated at 391. Refilled Vascepa 2g twice daily with a meal.  Attention deficit  disorder: treated with Daytrana (methylphenidate) patch.  Vertigo: treated with alprazolam 0.5-1 mg twice daily as needed. Referral requested by Patient for PT. This is acceptable. Written order given to patient.  Depression: treated with Lexapro 10 mg twice daily.  Insomnia: treated with trazodone 50-100 mg at bedtime.  Impaired glucose tolerance: HGBA1c at 6.3% on 09/29/22, up from 5.9% on 07/12/22. She will work on controlling this with healthy diet and exercise.  Eczema: referral for a dermatologist.  Musculoskeletal pain: treated with tramadol 50-100 mg every 6 hours as needed.   Hypothyroidism: treated with levothyroxine 50 mcg 6 days weekly and 100 mcg one day weekly. TSH at 2.34.  Mammogram: last completed 06/15/21. No mammographic evidence of malignancy. Recommended repeat in 2024.  Ordered bone density scan.  Dr. Yancey Flemings is her Gastroenterologist. Colonoscopy and upper endoscopy done Dec 2021. Sessile serrated polyp and hyperplastic polyp removed.  Vaccine counseling: UTD on flu, shingles vaccines. Will contact pharmacy for vaccine records.   Return in 1 year for health maintenance exam or as needed.     Annual wellness visit done today including the all of the  following: Reviewed patient's Family Medical History Reviewed and updated list of patient's medical providers Assessment of cognitive impairment was done Assessed patient's functional ability Established a written schedule for health screening services Health Risk Assessent Completed and Reviewed  Discussed health benefits of physical activity, and encouraged her to engage in regular exercise appropriate for her age and condition.        I,Alexander Ruley,acting as a Neurosurgeon for Margaree Mackintosh, MD.,have documented all relevant documentation on the behalf of Margaree Mackintosh, MD,as directed by  Margaree Mackintosh, MD while in the presence of Margaree Mackintosh, MD.   I, Margaree Mackintosh, MD, have reviewed all documentation for this visit. The documentation on 10/05/22 for the exam, diagnosis, procedures, and orders are all accurate and complete.

## 2022-09-29 ENCOUNTER — Other Ambulatory Visit: Payer: Self-pay | Admitting: Internal Medicine

## 2022-09-29 ENCOUNTER — Other Ambulatory Visit: Payer: Medicare Other

## 2022-09-29 DIAGNOSIS — R5383 Other fatigue: Secondary | ICD-10-CM

## 2022-09-29 DIAGNOSIS — I10 Essential (primary) hypertension: Secondary | ICD-10-CM

## 2022-09-29 DIAGNOSIS — E119 Type 2 diabetes mellitus without complications: Secondary | ICD-10-CM

## 2022-09-29 DIAGNOSIS — Z1231 Encounter for screening mammogram for malignant neoplasm of breast: Secondary | ICD-10-CM

## 2022-09-29 DIAGNOSIS — Z1329 Encounter for screening for other suspected endocrine disorder: Secondary | ICD-10-CM | POA: Diagnosis not present

## 2022-09-30 LAB — CBC WITH DIFFERENTIAL/PLATELET
Absolute Monocytes: 455 cells/uL (ref 200–950)
Basophils Absolute: 48 cells/uL (ref 0–200)
Basophils Relative: 0.7 %
Eosinophils Absolute: 110 cells/uL (ref 15–500)
Eosinophils Relative: 1.6 %
HCT: 40.3 % (ref 35.0–45.0)
Hemoglobin: 13.2 g/dL (ref 11.7–15.5)
Lymphs Abs: 2705 cells/uL (ref 850–3900)
MCH: 28.6 pg (ref 27.0–33.0)
MCHC: 32.8 g/dL (ref 32.0–36.0)
MCV: 87.4 fL (ref 80.0–100.0)
MPV: 9.8 fL (ref 7.5–12.5)
Monocytes Relative: 6.6 %
Neutro Abs: 3581 cells/uL (ref 1500–7800)
Neutrophils Relative %: 51.9 %
Platelets: 290 10*3/uL (ref 140–400)
RBC: 4.61 10*6/uL (ref 3.80–5.10)
RDW: 13.4 % (ref 11.0–15.0)
Total Lymphocyte: 39.2 %
WBC: 6.9 10*3/uL (ref 3.8–10.8)

## 2022-09-30 LAB — COMPLETE METABOLIC PANEL WITH GFR
AG Ratio: 1.4 (calc) (ref 1.0–2.5)
ALT: 10 U/L (ref 6–29)
AST: 19 U/L (ref 10–35)
Albumin: 4.2 g/dL (ref 3.6–5.1)
Alkaline phosphatase (APISO): 89 U/L (ref 37–153)
BUN: 20 mg/dL (ref 7–25)
CO2: 28 mmol/L (ref 20–32)
Calcium: 10 mg/dL (ref 8.6–10.4)
Chloride: 101 mmol/L (ref 98–110)
Creat: 0.71 mg/dL (ref 0.60–1.00)
Globulin: 2.9 g/dL (calc) (ref 1.9–3.7)
Glucose, Bld: 117 mg/dL — ABNORMAL HIGH (ref 65–99)
Potassium: 4.7 mmol/L (ref 3.5–5.3)
Sodium: 139 mmol/L (ref 135–146)
Total Bilirubin: 0.3 mg/dL (ref 0.2–1.2)
Total Protein: 7.1 g/dL (ref 6.1–8.1)
eGFR: 88 mL/min/{1.73_m2} (ref >=60)

## 2022-09-30 LAB — MICROALBUMIN / CREATININE URINE RATIO
Creatinine, Urine: 107 mg/dL (ref 20–275)
Microalb Creat Ratio: 28 mg/g creat (ref <30)
Microalb, Ur: 3 mg/dL

## 2022-09-30 LAB — HEMOGLOBIN A1C
Hgb A1c MFr Bld: 6.3 % of total Hgb — ABNORMAL HIGH (ref <5.7)
Mean Plasma Glucose: 134 mg/dL
eAG (mmol/L): 7.4 mmol/L

## 2022-09-30 LAB — TSH: TSH: 2.34 mIU/L (ref 0.40–4.50)

## 2022-09-30 LAB — LIPID PANEL
Cholesterol: 193 mg/dL (ref <200)
HDL: 59 mg/dL (ref >=50)
LDL Cholesterol (Calc): 82 mg/dL (calc)
Non-HDL Cholesterol (Calc): 134 mg/dL (calc) — ABNORMAL HIGH (ref <130)
Total CHOL/HDL Ratio: 3.3 (calc) (ref <5.0)
Triglycerides: 391 mg/dL — ABNORMAL HIGH (ref <150)

## 2022-10-04 ENCOUNTER — Telehealth: Payer: Self-pay

## 2022-10-04 ENCOUNTER — Ambulatory Visit (INDEPENDENT_AMBULATORY_CARE_PROVIDER_SITE_OTHER): Payer: Medicare Other | Admitting: Internal Medicine

## 2022-10-04 ENCOUNTER — Encounter: Payer: Self-pay | Admitting: Internal Medicine

## 2022-10-04 VITALS — BP 108/70 | HR 76 | Ht 58.75 in | Wt 181.8 lb

## 2022-10-04 DIAGNOSIS — G8929 Other chronic pain: Secondary | ICD-10-CM

## 2022-10-04 DIAGNOSIS — R7302 Impaired glucose tolerance (oral): Secondary | ICD-10-CM

## 2022-10-04 DIAGNOSIS — F32A Depression, unspecified: Secondary | ICD-10-CM

## 2022-10-04 DIAGNOSIS — E119 Type 2 diabetes mellitus without complications: Secondary | ICD-10-CM

## 2022-10-04 DIAGNOSIS — Z0000 Encounter for general adult medical examination without abnormal findings: Secondary | ICD-10-CM | POA: Diagnosis not present

## 2022-10-04 DIAGNOSIS — E781 Pure hyperglyceridemia: Secondary | ICD-10-CM

## 2022-10-04 DIAGNOSIS — Z96651 Presence of right artificial knee joint: Secondary | ICD-10-CM

## 2022-10-04 DIAGNOSIS — Z780 Asymptomatic menopausal state: Secondary | ICD-10-CM

## 2022-10-04 DIAGNOSIS — F900 Attention-deficit hyperactivity disorder, predominantly inattentive type: Secondary | ICD-10-CM

## 2022-10-04 DIAGNOSIS — L309 Dermatitis, unspecified: Secondary | ICD-10-CM | POA: Diagnosis not present

## 2022-10-04 DIAGNOSIS — E785 Hyperlipidemia, unspecified: Secondary | ICD-10-CM | POA: Diagnosis not present

## 2022-10-04 DIAGNOSIS — Z8669 Personal history of other diseases of the nervous system and sense organs: Secondary | ICD-10-CM

## 2022-10-04 DIAGNOSIS — R42 Dizziness and giddiness: Secondary | ICD-10-CM | POA: Diagnosis not present

## 2022-10-04 DIAGNOSIS — E039 Hypothyroidism, unspecified: Secondary | ICD-10-CM | POA: Diagnosis not present

## 2022-10-04 DIAGNOSIS — F988 Other specified behavioral and emotional disorders with onset usually occurring in childhood and adolescence: Secondary | ICD-10-CM

## 2022-10-04 DIAGNOSIS — M797 Fibromyalgia: Secondary | ICD-10-CM

## 2022-10-04 MED ORDER — ICOSAPENT ETHYL 1 G PO CAPS: 2.0000 g | ORAL_CAPSULE | Freq: Two times a day (BID) | ORAL | 4 refills | Status: DC

## 2022-10-04 NOTE — Patient Instructions (Signed)
  Rita Gregory , Thank you for taking time to come for your Medicare Wellness Visit. I appreciate your ongoing commitment to your health goals. Please review the following plan we discussed and let me know if I can assist you in the future.   These are the goals we discussed:  Goals   None     This is a list of the screening recommended for you and due dates:  Health Maintenance  Topic Date Due   Medicare Annual Wellness Visit  Never done   Hepatitis C Screening  Never done   DTaP/Tdap/Td vaccine (1 - Tdap) Never done   Pneumonia Vaccine (1 of 1 - PCV) Never done   Complete foot exam   11/05/2021   Mammogram  06/15/2022   COVID-19 Vaccine (6 - 2023-24 season) 10/20/2022*   Flu Shot  11/10/2022   Hemoglobin A1C  03/31/2023   Eye exam for diabetics  08/30/2023   Yearly kidney function blood test for diabetes  09/29/2023   Yearly kidney health urinalysis for diabetes  09/29/2023   Colon Cancer Screening  03/23/2025   DEXA scan (bone density measurement)  Completed   Zoster (Shingles) Vaccine  Completed   HPV Vaccine  Aged Out  *Topic was postponed. The date shown is not the original due date.

## 2022-10-04 NOTE — Telephone Encounter (Signed)
Patient needs PA on Icosapent Ethyl 1 Gm Key: [id number] Last name Rita Gregory 12/07/1945

## 2022-10-05 ENCOUNTER — Encounter: Payer: Self-pay | Admitting: Internal Medicine

## 2022-10-06 ENCOUNTER — Ambulatory Visit: Payer: Medicare Other

## 2022-10-07 ENCOUNTER — Other Ambulatory Visit (HOSPITAL_COMMUNITY): Payer: Self-pay

## 2022-10-07 ENCOUNTER — Telehealth: Payer: Self-pay

## 2022-10-07 NOTE — Telephone Encounter (Signed)
Pharmacy Patient Advocate Encounter   Received notification that prior authorization for Icosapent Ethyl 1gm caps is required/requested.   PA submitted to [employer] via CoverMyMeds Key # [id number] Status is pending

## 2022-10-10 ENCOUNTER — Other Ambulatory Visit (HOSPITAL_COMMUNITY): Payer: Self-pay

## 2022-10-10 NOTE — Telephone Encounter (Signed)
Pharmacy Patient Advocate Encounter  Received notification from [facility] Electronic PA)  that the request for prior authorization for VASCEPA( ICOSPENT ETHYL 1MG  CAPSULE) has been denied due to Coverage is provided in situations where the patient has established cardiovascular (CV) disease and Vascepa is being used for secondary prevention. Coverage cannot be authorized at this time. Other coverage conditions may apply..    Please be advised we currently do not have a Pharmacist to review denials, therefore you will need to process appeals accordingly as needed. Thanks for your support at this time.   You may call [phone] or fax [phone], to appeal.

## 2022-10-24 ENCOUNTER — Ambulatory Visit
Admission: RE | Admit: 2022-10-24 | Discharge: 2022-10-24 | Disposition: A | Discharge status: Home / Self Care | Payer: Medicare Other | Source: Ambulatory Visit | Attending: Internal Medicine | Admitting: Internal Medicine

## 2022-10-24 DIAGNOSIS — Z1231 Encounter for screening mammogram for malignant neoplasm of breast: Secondary | ICD-10-CM | POA: Diagnosis not present

## 2022-12-02 ENCOUNTER — Other Ambulatory Visit: Payer: Self-pay | Admitting: Internal Medicine

## 2022-12-03 ENCOUNTER — Other Ambulatory Visit: Payer: Self-pay | Admitting: Internal Medicine

## 2022-12-08 DIAGNOSIS — R42 Dizziness and giddiness: Secondary | ICD-10-CM | POA: Diagnosis not present

## 2022-12-09 DIAGNOSIS — M26609 Unspecified temporomandibular joint disorder, unspecified side: Secondary | ICD-10-CM | POA: Diagnosis not present

## 2022-12-09 DIAGNOSIS — G43809 Other migraine, not intractable, without status migrainosus: Secondary | ICD-10-CM | POA: Diagnosis not present

## 2022-12-09 DIAGNOSIS — M62838 Other muscle spasm: Secondary | ICD-10-CM | POA: Diagnosis not present

## 2023-01-12 ENCOUNTER — Ambulatory Visit: Payer: Medicare Other | Admitting: Internal Medicine

## 2023-01-18 DIAGNOSIS — H2511 Age-related nuclear cataract, right eye: Secondary | ICD-10-CM | POA: Diagnosis not present

## 2023-01-18 DIAGNOSIS — S0011XA Contusion of right eyelid and periocular area, initial encounter: Secondary | ICD-10-CM | POA: Diagnosis not present

## 2023-01-18 DIAGNOSIS — H1131 Conjunctival hemorrhage, right eye: Secondary | ICD-10-CM | POA: Diagnosis not present

## 2023-01-19 ENCOUNTER — Telehealth: Payer: Self-pay | Admitting: Internal Medicine

## 2023-01-19 NOTE — Telephone Encounter (Signed)
Rita Gregory [phone]  Rita called to say she had a fall 6 days ago and hit her forehead and eye, she went to eye doctor - Dr Burgess Estelle, he thought she might need to be seen by PCP for injuries. She started naming off her injuries, wrist might be sprang or broken, knee hurt. She said she would need CT or MRI for these injuries. She also said she was bruised and banged up pretty bad, she did not go to emergency room since her husband was there. I suggested since she had several injuries she might want to go to ortho, because they have equipment in their office to take care of her where we would have to refer out. She said she see Dr Thurston Hole so she would call there.

## 2023-01-20 DIAGNOSIS — M25511 Pain in right shoulder: Secondary | ICD-10-CM | POA: Diagnosis not present

## 2023-01-20 DIAGNOSIS — M25531 Pain in right wrist: Secondary | ICD-10-CM | POA: Diagnosis not present

## 2023-01-20 DIAGNOSIS — M545 Low back pain, unspecified: Secondary | ICD-10-CM | POA: Diagnosis not present

## 2023-01-26 DIAGNOSIS — G43019 Migraine without aura, intractable, without status migrainosus: Secondary | ICD-10-CM | POA: Diagnosis not present

## 2023-01-31 ENCOUNTER — Encounter: Payer: Self-pay | Admitting: Internal Medicine

## 2023-01-31 ENCOUNTER — Ambulatory Visit (INDEPENDENT_AMBULATORY_CARE_PROVIDER_SITE_OTHER): Payer: Medicare Other | Admitting: Internal Medicine

## 2023-01-31 VITALS — BP 120/60 | HR 88 | Ht 58.75 in | Wt 178.4 lb

## 2023-01-31 DIAGNOSIS — E782 Mixed hyperlipidemia: Secondary | ICD-10-CM | POA: Diagnosis not present

## 2023-01-31 DIAGNOSIS — R7303 Prediabetes: Secondary | ICD-10-CM | POA: Diagnosis not present

## 2023-01-31 DIAGNOSIS — R7989 Other specified abnormal findings of blood chemistry: Secondary | ICD-10-CM | POA: Diagnosis not present

## 2023-01-31 DIAGNOSIS — E678 Other specified hyperalimentation: Secondary | ICD-10-CM

## 2023-01-31 DIAGNOSIS — E063 Autoimmune thyroiditis: Secondary | ICD-10-CM

## 2023-01-31 LAB — POCT GLYCOSYLATED HEMOGLOBIN (HGB A1C): Hemoglobin A1C: 6.1 % — AB (ref 4.0–5.6)

## 2023-01-31 MED ORDER — DEXCOM G7 SENSOR MISC: 3.0000 | 3 refills | Status: DC

## 2023-01-31 MED ORDER — FREESTYLE LIBRE 2 SENSOR MISC: 1.0000 | 3 refills | Status: DC

## 2023-01-31 MED ORDER — FREESTYLE LIBRE 2 READER DEVI: 1.0000 | Freq: Every day | 0 refills | Status: DC

## 2023-01-31 MED ORDER — FREESTYLE LIBRE 3 PLUS SENSOR MISC: 1.0000 | 3 refills | Status: DC

## 2023-01-31 MED ORDER — SEMAGLUTIDE(0.25 OR 0.5MG/DOS) 2 MG/3ML ~~LOC~~ SOPN: 0.5000 mg | PEN_INJECTOR | SUBCUTANEOUS | 11 refills | Status: DC

## 2023-01-31 MED ORDER — FREESTYLE LITE TEST VI STRP: ORAL_STRIP | 12 refills | Status: DC

## 2023-01-31 MED ORDER — DEXCOM G7 RECEIVER DEVI: 1.0000 | Freq: Once | 0 refills | Status: AC

## 2023-01-31 NOTE — Patient Instructions (Addendum)
Please continue Levothyroxine 50 mcg 6/7 days and 100 mcg 1/7 days.  Take the thyroid hormone every day, with water, in am, at least 30 minutes before breakfast, separated by at least 4 hours from: - acid reflux medications - calcium - iron - multivitamins  Please start Ozempic 0.25 mg weekly in a.m. (for example on Sunday morning) x 4 weeks, then increase to 0.5 mg weekly in a.m. if no nausea or hypoglycemia.  Please come back for a follow-up appointment in 6 months.

## 2023-01-31 NOTE — Progress Notes (Unsigned)
Patient ID: MARTEEN CATA, female   DOB: 03/10/1946, 77 y.o.   MRN: [id number]  HPI: Rita Gregory is a 77 y.o. female, initially self-referred, returning for follow-up for prediabetes, since early 2000s, and suspected hypoglycemic spells, Hashimoto's hypothyroidism.  She is a former diabetes Programmer, systems. her husband, Dr. Molli Knock, is also my patient. She previously saw Dr. Leslie Dales before he retired.  Last visit with me 6 months ago.  Interim history: She has headaches, vertigo.  No chest pain, shortness of breath. She had a fall 2 weeks ago >> pulled the hamstring, sprained her wist, hit head.  She saw orthopedics.  No fractures. She eats 2 meals a day, 1-2 snacks.  She did not have the sensor now for 2 months.  She is not sure how her sugars are doing.  She would be interested in trying a GLP1 receptor agonist. In 12/2022, she believes she had an episode of thyroiditis: Neck pain, thyroid fullness on palpation.  This resolved.  Prediabetes:  Reviewed history: Patient describes that she has longstanding sensitivity to blood sugar variations even within the normal range.    She describes that after she had attached a freestyle libre CGM, she realized that her blood sugars did not actually drop as low as she thought when she was feeling hypoglycemic, however, she does describe significant hypoglycemic symptoms including slurring of words and disorientation when sugars go to 68-70.  When sugars get to around 80-90, she describes heartburn, vision changes, headaches, extreme fatigue, dizziness.  This happens mostly during the day, after meals.  She corrects them with 2 glucose tablets.  She also feels the needs to eat ice cream at night to raise her glucose around 115-120 when going to bed, to avoid lows overnight.  Before our visit in 12/2020 had to change her sensor targets from 90 to 85 and then to 80 as it was alarming her too much as she could not sleep.  In 07/2022, she  had a period of lower blood sugars, in the 70s, but they improved.  She prefers to keep her blood sugars higher than 100 at all times.  Reviewed patient's HbA1c levels: Lab Results  Component Value Date   HGBA1C 6.3 (H) 09/29/2022   HGBA1C 5.9 (A) 07/12/2022   HGBA1C 6.0 (A) 01/07/2022   HGBA1C 6.0 (A) 08/05/2021   HGBA1C 5.8 (A) 01/07/2021   HGBA1C 6.2 (H) 11/03/2020   HGBA1C 5.8 (H) 11/22/2016   Patient was started on 500 mg of metformin ER by Dr. Leslie Dales in 08/2020, but she stopped due to concern for hypoglycemia. Pt is currently not on any medications for her prediabetes.  At today's visit, she is not checking blood sugars as she does not have the CGM (freestyle libre 3 not available) or test strips.  She occasionally feels her sugars are dropping, but is not sure how low.  Prev.:   Previously:   Lowest blood sugars: 68 >> 65 >> 70s >> >70 >> 75. Hypoglycemia awareness at 70. Highest blood sugar: 130 >> 204 (Thanksgiving) >> 202-4 >> 209 >> 202.  Pt's meals are: - Brunch: Half a cup of great grain cereal with 10 pecans and 10 for almonds, + half-and-half - Dinner: 4 to 6 ounces of meat or fish -usually present - Snacks: 1-2 -including ice cream at night  - no CKD; last BUN/creatinine:  Lab Results  Component Value Date   BUN 20 09/29/2022   BUN 18 01/25/2022   CREATININE 0.71 09/29/2022  CREATININE 0.73 01/25/2022   Lab Results  Component Value Date   MICRALBCREAT 28 09/29/2022   MICRALBCREAT 17 01/25/2022   MICRALBCREAT 15 11/03/2020  11/03/2020: ACR 15 She is not on ARB or ACE inhibitor.  -+ HL; last set of lipids: Lab Results  Component Value Date   CHOL 193 09/29/2022   HDL 59 09/29/2022   LDLCALC 82 09/29/2022   TRIG 391 (H) 09/29/2022   CHOLHDL 3.3 09/29/2022  On pravastatin 40 mg daily and Vascepa 2 g twice a day -started before our visit from 12/2020, but now not covered by insurance due to absence of cardiovascular disease >> now pays out of  pocket. Now 1 mg 2x a day 2/2 diarrhea. She may have been off the med in 09/2022.   - last eye exam was 08/30/2022. No DR. + dry macular degeneration, dry eyes.    - no numbness and tingling in her feet.  Latest foot exam 10/04/2022.   Pt has FH of DM in father - DM2.   Hypothyroidism:   Pt is on levothyroxine 50 mcg 6/7 days and 100 mcg 1/7 days, increase due to hair loss: - at night - fasting - at least 30 min from b'fast - no calcium - no iron - + multivitamins - usually at night but occasionally in am - + PPIs at night - not on Biotin  TFTs reviewed: Lab Results  Component Value Date   TSH 2.34 09/29/2022   TSH 1.25 07/12/2022   TSH 3.88 01/07/2022   TSH 1.60 08/05/2021   TSH 0.63 05/21/2021   TSH 1.13 02/18/2021   TSH 4.00 11/03/2020   TSH 2.17 11/22/2016   She also has a history of a tiny thyroid nodule: 08/30/2007: Thyroid ultrasound: Findings: Thyroid gland is lower limits of normal in size with the  right lobe measuring 3.3 cm long X 1.2 cm AP X 1.1 cm wide.  Left  lobe measures 3.9 cm long X 1.0 cm AP X 1.3 cm wide.  Isthmus  measures 2 mm AP.  Thyroid echotexture is homogeneous.  At the  medial mid left lobe the is cystic focus measuring 5 mm long X 3 mm  AP X 3 mm wide containing tiny anterior nonshadowing mural  echogenicity.     IMPRESSION:  1. Solitary 5 mm slightly complex cyst at the medial mid left lobe  thyroid favoring cystic adenoma.  2.  Otherwise, negative.    Pt denies: - feeling nodules in neck - hoarseness - dysphagia - choking  Reviewed latest vitamin D levels: Lab Results  Component Value Date   VD25OH 77.11 07/12/2022   VD25OH >120 01/07/2022   VD25OH 98.09 08/05/2021   VD25OH 101 (H) 11/03/2020   VD25OH 69 11/22/2016  She was previously on ergocalciferol 50,000 units weekly.  I previously advised her to take vitamin D3 1000 units daily but she had ergocalciferol at home and switched to 50,000 units every 2 weeks.  Afterwards, we  stopped the vitamin D supplements .- only takes MVI.  A B12 level was normal: Lab Results  Component Value Date   VITAMINB12 911 11/03/2020   She also has a history of: ADHD-on Adderall Chronic pain syndrome Chronic fatigue Fibromyalgia-sees rheumatology -on methylphenidate patch for this and for ADD GERD HTN Back pain and shoulder pain-status post spinal surgery - Dr. Danielle Dess Osteoarthritis Migraines Dry eyes-on Benay Spice and Systain - possibly Sjogren increased urination (has stress incontinence - stable) She was referred to [facility] since last visit (Dr.  Hunter) for vertigo. She also had epigastric hernia surgery in 01/2022.  ROS: + see HPI  Past Medical History:  Diagnosis Date   ADD (attention deficit disorder)    Arthritis of left acromioclavicular joint    Asthma due to seasonal allergies    Cervical disc disorder with left arm radiculopathy of cervical region 04/20/2017   frequent headaches   Chronic pain    Dr. Danielle Dess follows   Chronically dry eyes, bilateral    Complication of anesthesia    01-24-2022  pt stated and her husband (dr Fransico Michael) pt is  very sensitive to fentyl  and other anesthesia drugs,  states she needs to be breathing, fully awake ,  prior to being discharged   Congenital defect of septal closure    previously followed by dr h. Katrinka Blazing  (last visit approx 2011 )  per echo in care everywhere 10-18-2005 possible pfo,  cardiac MRI/ MRA 11-11-2005 no evidence for significant left-to-right or right-to-left shunting at rest and does not demonstrate atrial septal defect, normal left and right atrial size w/ interatrial septum is redundant & aneurymal   Diverticulosis of colon (without mention of hemorrhage) 11/09/2007   Environmental and seasonal allergies    Epigastric hernia    Fibromyalgia    GAD (generalized anxiety disorder)    GERD (gastroesophageal reflux disease)    Hashimoto's disease    endocrinologist--- dr Lafe Garin   History of gastric  ulcer    1990s   History of hypertension    Hyperlipidemia    Idiopathic postprandial hypoglycemia    followed by dr Lafe Garin   Kyphoscoliosis    40 degree bend ro right side   MDD (major depressive disorder)    Paroxysmal spells 02/13/2013   suspect hypoglycemic   Pre-diabetes    endocrinologist--- dr Lafe Garin;    (01-24-2022  pt uses Libre 2,  stated fasting blood sugar average 100s -- 200s)   Rash    01-24-2022  pt reports currenly has rash under breast , this happens intermittantly   Vestibular migraine    neurologist--- dr d. patel   Wears glasses    Past Surgical History:  Procedure Laterality Date   ANTERIOR LATERAL LUMBAR FUSION 4 LEVELS Left 06/16/2015   Procedure: ANTERIOR LATERAL LUMBAR FUSION THORACIC TWELVE-LUMBAR FOUR;  Surgeon: Barnett Abu, MD;  Location: [facility];  Service: Neurosurgery;  Laterality: Left;  Thoracolumbar spine   CESAREAN SECTION     1972  and 1974   CHOLECYSTECTOMY, LAPAROSCOPIC  1992   COLONOSCOPY WITH ESOPHAGOGASTRODUODENOSCOPY (EGD)  03/23/2020   dr Barbette Or HERNIA REPAIR N/A 01/27/2022   Procedure: OPEN EPIGASTRIC HERNIA REPAIR WITH MESH PATCH;  Surgeon: Darnell Level, MD;  Location: [facility];  Service: General;  Laterality: N/A;   ESOPHAGOGASTRODUODENOSCOPY  02/23/2012   Procedure: ESOPHAGOGASTRODUODENOSCOPY (EGD);  Surgeon: Hilarie Fredrickson, MD;  Location: [facility];  Service: Endoscopy;  Laterality: N/A;   KNEE ARTHROSCOPY W/ ACL RECONSTRUCTION Right 2003   approx   KNEE ARTHROSCOPY W/ MENISCAL REPAIR Right 04/17/2006   @[facility]  by dr Thurston Hole   POSTERIOR LUMBAR FUSION 4 LEVEL N/A 06/16/2015   Procedure: POSTERIOR LUMBAR FUSION LUMBAR FOUR-FIVE LUMBAR FIVE-SACRAL ONE;  Surgeon: Barnett Abu, MD;  Location: [facility];  Service: Neurosurgery;  Laterality: N/A;   RADIOLOGY WITH ANESTHESIA N/A 06/15/2015   Procedure: RADIOLOGY WITH ANESTHESIA;  Surgeon: Medication Radiologist, MD;  Location: [facility];  Service:  Radiology;  Laterality: N/A;   SHOULDER ARTHROSCOPY Left 04/28/2017  Procedure: ARTHROSCOPY SHOULDER WITH EXTENSIVE DEBRIDEMENT ROTATOR CUFF TEAR;  Surgeon: Salvatore Marvel, MD;  Location: [facility];  Service: Orthopedics;  Laterality: Left;   SHOULDER ARTHROSCOPY WITH ROTATOR CUFF REPAIR Right 06/05/2009   @[facility]  by dr Thurston Hole   TOTAL ABDOMINAL HYSTERECTOMY W/ BILATERAL SALPINGOOPHORECTOMY Bilateral 1988   per pt and Appendectomy   TOTAL KNEE ARTHROPLASTY Right 12/22/2008   @[facility] by dr Lenise Arena;    intraoperatively repair popliteal artery by dr fields   Social History   Socioeconomic History   Marital status: Married    Spouse name: Casimiro Needle   Number of children: Not on file   Years of education: Not on file   Highest education level: Not on file  Occupational History   Occupation: Engineering geologist: [employer]  Tobacco Use   Smoking status: Never    Passive exposure: Yes   Smokeless tobacco: Never   Tobacco comments:    Flight attentent for 7 years. [id number] Passive smoker  Vaping Use   Vaping status: Never Used  Substance and Sexual Activity   Alcohol use: Not Currently    Comment: Very rarely    Drug use: Never   Sexual activity: Yes    Partners: Male    Birth control/protection: Surgical  Other Topics Concern   Not on file  Social History Narrative   Husband is Dr. Molli Knock       Right Handed    Lives in a three story home    Social Determinants of Health   Financial Resource Strain: Not on file  Food Insecurity: Low Risk  (12/09/2022)   Received from [facility]   Hunger Vital Sign    Worried About Running Out of Food in the Last Year: Never true    Ran Out of Food in the Last Year: Never true  Transportation Needs: No Transportation Needs (12/09/2022)   Received from [facility]    In the past 12 months, has lack of reliable transportation kept you from medical appointments, meetings, work or from getting things needed for  daily living? : No  Physical Activity: Not on file  Stress: Not on file  Social Connections: Not on file  Intimate Partner Violence: Not on file   Current Outpatient Medications on File Prior to Visit  Medication Sig Dispense Refill   acetaminophen (TYLENOL) 650 MG CR tablet Take 650 mg by mouth every 8 (eight) hours as needed for pain.     albuterol (VENTOLIN HFA) 108 (90 Base) MCG/ACT inhaler Inhale 2 puffs into the lungs every 4 (four) hours as needed for wheezing 18 g 5   ALPRAZolam (XANAX) 0.5 MG tablet One half to one tablet twice daily as needed for vertigo (Patient not taking: Reported on 10/04/2022) 60 tablet 0   conjugated estrogens (PREMARIN) vaginal cream Place 0.625 mg vaginally daily as needed.      Continuous Blood Gluc Receiver (FREESTYLE LIBRE 2 READER) DEVI Use as instructed to check blood sugar (Patient not taking: Reported on 10/04/2022) 1 each 0   Continuous Blood Gluc Sensor (FREESTYLE LIBRE 3 SENSOR) MISC 1 each by Does not apply route every 14 (fourteen) days. 6 each 3   Cyanocobalamin (B-12 PO) Take by mouth at bedtime.     EPINEPHrine 0.3 mg/0.3 mL IJ SOAJ injection Use as directed for severe allergic reaction (Patient taking differently: Inject 0.3 mg into the muscle as needed.) 2 each 6   escitalopram (LEXAPRO) 10 MG tablet Take  10-15 mg by mouth 2 (two) times daily.     estrogens, conjugated, (PREMARIN) 0.45 MG tablet Take 1 tablet (0.45 mg total) by mouth daily. (Patient taking differently: Take 0.45 mg by mouth at bedtime.) 90 tablet 3   glucose blood test strip Use to test blood sugar 2 times daily 100 each 8   hydrocortisone 2.5 % cream as needed.     icosapent Ethyl (VASCEPA) 1 g capsule Take 2 capsules (2 g total) by mouth 2 (two) times daily with a meal. 360 capsule 4   ketoconazole (NIZORAL) 2 % cream APPLY 1 APPLICATION TOPICALLY 2 TIMES DAILY AS NEEDED FOR IRRITATION. 15 g 3   Lancets (FREESTYLE) lancets Use to test blood sugar twice a day 200 each 4    levothyroxine (SYNTHROID) 50 MCG tablet Take by mouth 1 tablet 6 out of 7 days and 2 tablets 1 out of 7 days before breakfast 105 tablet 3   Lifitegrast (XIIDRA) 5 % SOLN Place 1 drop into both eyes 2 (two) times daily. (Patient taking differently: Place 1 drop into both eyes 2 (two) times daily.) 900 each 4   loratadine (CLARITIN) 10 MG tablet Take 10 mg by mouth daily as needed for allergies.     meclizine (ANTIVERT) 25 MG tablet Take 50-75 mg by mouth 3 (three) times daily as needed for dizziness.     methylphenidate (DAYTRANA) 10 mg/9hr patch Place 1 patch (10 mg total) onto the skin daily in the afternoon as needed. 01/06/22 30 patch 0   methylphenidate (DAYTRANA) 15 mg/9hr Place 1 patch (15 mg total) onto the skin daily. 01/06/22 30 patch 0   miconazole (MONISTAT 7) 2 % vaginal cream Use as needed     Multiple Vitamins-Minerals (CENTRUM SILVER PO) Take 1 tablet by mouth at bedtime. CENTRUM SILVER GUMMY ONE A DAY     oxymetazoline (AFRIN) 0.05 % nasal spray Place into both nostrils 2 (two) times daily as needed.     pantoprazole (PROTONIX) 40 MG tablet TAKE 1 TABLET BY MOUTH AT BEDTIME 90 tablet 3   Polyvinyl Alcohol-Povidone (REFRESH OP) Apply to eye as needed.     pravastatin (PRAVACHOL) 40 MG tablet TAKE 1 TABLET BY MOUTH AT BEDTIME 90 tablet 2   Pyridoxine HCl (B-6 PO) Take 1 capsule by mouth at bedtime.     silver sulfADIAZINE (SILVADENE) 1 % cream APPLY TO THE AFFECTED AREA(S) AS NEEDED FOR BURN AS DIRECTED 25 g 2   testosterone (ANDROGEL) 50 MG/5GM (1%) GEL  (Patient not taking: Reported on 10/04/2022)     traZODone (DESYREL) 50 MG tablet Take 1-2 tablets (50-100 mg total) by mouth at bedtime as needed for sleep (Patient taking differently: Take 50-100 mg by mouth at bedtime.) 180 tablet 0   triamcinolone (KENALOG) 0.1 % Apply topically in the morning and at bedtime.     No current facility-administered medications on file prior to visit.   Allergies  Allergen Reactions   Betadine  [Povidone Iodine] Other (See Comments)    Blister hands and feet     Fentanyl Shortness Of Breath    Pt has tolerated this medication 06/2015 pre and during operation. MD ok giving.  Low dose only   Morphine And Codeine Anaphylaxis    respiratory depression    Bupropion Hcl     Dizzy, fog   Codeine Nausea And Vomiting   Demerol [Meperidine] Nausea And Vomiting   Dextromethorphan Diarrhea   Guaifenesin & Derivatives Diarrhea   Lipitor [Atorvastatin]  Myalgia   Lovaza [Omega-3-Acid Ethyl Esters] Diarrhea   Lyrica [Pregabalin]     Fatigue and depression   Naltrexone     depression   Percodan [Oxycodone-Aspirin] Nausea And Vomiting   Polyethylene Glycol     Blurred vision, headache, and causes bright red skin irritation if on skin.   Prilosec [Omeprazole]     Hx ileus   Topamax [Topiramate]     disorientation    Valium [Diazepam]     Small doses are OK but large doses are not. Long-term use causes depression.    Versed [Midazolam]     In moderate in large doses shortness of breath, and respiratory depression    Adhesive [Tape] Rash    Skin irritation    Concerta [Methylphenidate] Palpitations   Doxycycline Rash    Pruritic rash   Lamotrigine Rash   Nickel Rash    Skin irritation    Oxycodone Nausea And Vomiting and Rash    And depression   Family History  Problem Relation Age of Onset   Ovarian cancer Mother        passed at age 41   Hypertension Mother    Heart disease Father    Kidney disease Father        Kidney cancer   Diabetes type II Father        passed at 2 yrs old   Colon polyps Father    Myasthenia gravis Father        passed from Montserrat gravis   Bipolar disorder Sister        Osteopenia   Bipolar disorder Son    Esophageal cancer Neg Hx    Rectal cancer Neg Hx    Stomach cancer Neg Hx    Colon cancer Neg Hx    PE: BP 120/60   Pulse 88   Ht 4' 10.75" (1.492 m)   Wt 178 lb 6.4 oz (80.9 kg)   SpO2 95%   BMI 36.34 kg/m   Wt Readings  from Last 3 Encounters:  01/31/23 178 lb 6.4 oz (80.9 kg)  10/04/22 181 lb 12 oz (82.4 kg)  07/12/22 182 lb 3.2 oz (82.6 kg)    Constitutional: overweight, in NAD Eyes:  EOMI, no exophthalmos ENT: no neck masses, no cervical lymphadenopathy Cardiovascular: RRR, No MRG, + mild periankle edema bilaterally Respiratory: CTA B Musculoskeletal: no deformities Skin:no rashes Neurological: no tremor with outstretched hands  ASSESSMENT: 1.  Prediabetes   2.  Idiopathic postprandial syndrome   3.  Hyperlipidemia  4.  Hashimoto's hypothyroidism  5.  High Vitamin D    PLAN:  1. Patient with increased glucose tolerance since 2002, with HbA1c levels in the prediabetic range.  Her highest HbA1c was in 09/2022, at 6.3%.  At our last visit, HbA1c was 5.9%. -She tried metformin before but she stopped due to presumed hypoglycemic episodes.  She started a CGM in 2021 and she mentioned that this was an eye opener and demonstrated that her episodes of hypoglycemic symptoms were not, in fact, related to low blood sugars.  When she saw Dr. Leslie Dales for the last time in 08/2020, she was advised to resume the CGM.  She is paying out-of-pocket for this.  -At last visit, we could not download the CGM trends.  However, she mentioned that sugars remain well-controlled, except for a period of time last month when they were staying in the 70s and she had to correct them frequently.  She prefers to keep them in the  100-120 range. -At today's visit, she does not have the CGM as she was not able to obtain the freestyle libre 3 sensor due to being on backorder.  I printed her prescriptions for the freestyle libre 3+,2 and the Dexcom G7. -She feels that her blood sugars are still occasionally dropping, but she is not sure how low. -at today's visit, HbA1c is slightly lower than before, at 6.1%, still in the prediabetic range -She is interested in trying a GLP-1 receptor agonist.  We discussed that this may not be  covered for her since she does not have a diagnosis of diabetes.  However, she does have a high BMI, above 30.  I sent a prescription for low-dose Ozempic to her pharmacy and advised her to start at a low dose and increase as tolerated and as needed.  He does not have a history of pancreatitis. -I will see her back in 6 months  2.  Idiopathic postprandial syndrome -Associated with spells with weakness, dizziness, burning sensation in her stomach, resolved after taking glucose -However, these episodes are not associated with low blood sugars.  She occasionally has dizzy spells occurring when her blood sugars are at 90, and she eats to prevent this to happen.  She may have neurologic symptoms when her sugars drop under 70.  We discussed that since these are normal sugars, it appears that she may have just an increase sensitivity to the fluctuations in the blood sugars.  Of note, she saw neurology in the past and no etiology was found for these episodes. MRI was normal.   -No medication is available for this condition, however, we discussed about dietary changes that she can institute to prevent large fluctuations in blood sugars: Eating low glycemic index foods, mostly focusing on solid foods, rather than semisolid or liquid, no  liquids at the time of the meal but separate them by at least 30 minutes from the meal, and start the meal with protein and fat and end with carbs.  -She already has glucose tablets at hand and takes 1-2 when she feels that her sugars are dropping -We previously discussed that her freestyle libre CGM may indicate blood sugars that are 10 to 20 mg/dL lower than actually measured by her glucometer.  Therefore, whenever she suspects a low, she needs to check with her glucometer to make sure that these are real levels. -Since last visit, her insurance declined coverage of the freestyle libre CGM.  She will continue to pay for the sensor out-of-pocket. -At today's visit, she would like  to try another sensor, if available.  Given prescriptions.  3.  Hyperlipidemia -Her lipid panel from 09/2022 was reviewed: Fractions at goal with exception of a high triglyceride level: Lab Results  Component Value Date   CHOL 193 09/29/2022   HDL 59 09/29/2022   LDLCALC 82 09/29/2022   TRIG 391 (H) 09/29/2022   CHOLHDL 3.3 09/29/2022  -She continues on pravastatin 40 mg daily.  Vascepa 1 g twice a day (declined by the insurance due to the fact that she does not have cardiovascular disease, but she is paying out-of-pocket).  She uses a lower dose than recommended due to diarrhea with a higher dose.  4.  Hashimoto's hypothyroidism - latest thyroid labs reviewed with pt. >> normal: Lab Results  Component Value Date   TSH 2.34 09/29/2022  - she continues on LT4-generic now-50 mcg 6/7 days but 100 mcg 1/7 days  - pt feels good on this dose. - we  discussed about taking the thyroid hormone every day, with water, >30 minutes before breakfast, separated by >4 hours from acid reflux medications, calcium, iron, multivitamins. Pt. is taking it correctly. - will check thyroid tests today  5.  High vitamin D level -She had 2 instances of high vitamin D level, at 100 in 10/2020 and >120 in 12/2021 -She was prev. on ergocalciferol 50,000 units every 2 weeks but we changed to the vitamin D only in multivitamin.  On this dose, her vitamin D level normalized: Lab Results  Component Value Date   VD25OH 77.11 07/12/2022  -We will continue the above supplement dose. -Will recheck the level today  Carlus Pavlov, MD PhD [facility] Endocrinology

## 2023-02-01 DIAGNOSIS — Z23 Encounter for immunization: Secondary | ICD-10-CM | POA: Diagnosis not present

## 2023-02-01 LAB — TSH: TSH: 0.95 u[IU]/mL (ref 0.35–5.50)

## 2023-02-01 LAB — T4, FREE: Free T4: 0.85 ng/dL (ref 0.60–1.60)

## 2023-02-01 LAB — VITAMIN D 25 HYDROXY (VIT D DEFICIENCY, FRACTURES): VITD: 64.19 ng/mL (ref 30.00–100.00)

## 2023-02-10 NOTE — Addendum Note (Signed)
Addended by: Pollie Meyer on: 02/10/2023 08:39 AM   Modules accepted: Orders

## 2023-02-17 ENCOUNTER — Other Ambulatory Visit: Payer: Self-pay | Admitting: Internal Medicine

## 2023-02-28 ENCOUNTER — Telehealth: Payer: Self-pay

## 2023-02-28 ENCOUNTER — Other Ambulatory Visit (HOSPITAL_COMMUNITY): Payer: Self-pay

## 2023-02-28 NOTE — Telephone Encounter (Signed)
Pharmacy Patient Advocate Encounter   Received notification from CoverMyMeds that prior authorization for Ozempic (0.25 or 0.5 MG/DOSE) 2MG /3ML pen-injectors is required/requested.   Insurance verification completed.   The patient is insured through [employer] .   Per test claim:  Plan exclusion. This drug is not covered if the patient does not have labs supporting an A1c of 6.5 or higher

## 2023-03-01 NOTE — Telephone Encounter (Signed)
We can try to submit Wegovy instead (at the same dose) mentioning obesity class II as an indication.

## 2023-03-01 NOTE — Telephone Encounter (Signed)
Please advise on how you would like to proceed?

## 2023-03-02 DIAGNOSIS — Z23 Encounter for immunization: Secondary | ICD-10-CM | POA: Diagnosis not present

## 2023-03-02 NOTE — Telephone Encounter (Signed)
Please Submit PA for [facility]

## 2023-03-03 ENCOUNTER — Other Ambulatory Visit (HOSPITAL_COMMUNITY): Payer: Self-pay

## 2023-03-03 ENCOUNTER — Telehealth: Payer: Self-pay

## 2023-03-03 NOTE — Telephone Encounter (Signed)
Pharmacy Patient Advocate Encounter   Received notification from Pt Calls Messages that prior authorization for [facility] is required/requested.   Insurance verification completed.   The patient is insured through [employer] .   Per test claim: PA required; PA submitted to above mentioned insurance via CoverMyMeds Key/confirmation #/EOC [id number] Status is pending   PA has been APPROVED through 03/02/2024

## 2023-03-24 ENCOUNTER — Telehealth: Payer: Self-pay | Admitting: Internal Medicine

## 2023-03-24 MED ORDER — WEGOVY 0.25 MG/0.5ML ~~LOC~~ SOAJ: 0.2500 mg | SUBCUTANEOUS | 3 refills | Status: DC

## 2023-03-24 NOTE — Telephone Encounter (Signed)
Requested Prescriptions   Signed Prescriptions Disp Refills   Semaglutide-Weight Management (WEGOVY) 0.25 MG/0.5ML SOAJ 6 mL 3    Sig: Inject 0.25 mg into the skin once a week.    Authorizing Provider: Carlus Pavlov    Ordering User: Pollie Meyer   Rx sent

## 2023-03-24 NOTE — Telephone Encounter (Signed)
Patient advising that her insurance sent her a letter advising that she is approved for [facility]. Patient asking to have an rx sent to [facility] [place]

## 2023-03-31 DIAGNOSIS — M19071 Primary osteoarthritis, right ankle and foot: Secondary | ICD-10-CM | POA: Diagnosis not present

## 2023-03-31 DIAGNOSIS — M19072 Primary osteoarthritis, left ankle and foot: Secondary | ICD-10-CM | POA: Diagnosis not present

## 2023-04-14 ENCOUNTER — Inpatient Hospital Stay: Admission: RE | Admit: 2023-04-14 | Payer: Medicare Other | Source: Ambulatory Visit

## 2023-05-09 DIAGNOSIS — L821 Other seborrheic keratosis: Secondary | ICD-10-CM | POA: Diagnosis not present

## 2023-05-09 DIAGNOSIS — L565 Disseminated superficial actinic porokeratosis (DSAP): Secondary | ICD-10-CM | POA: Diagnosis not present

## 2023-05-12 ENCOUNTER — Other Ambulatory Visit: Payer: Self-pay | Admitting: Orthopaedic Surgery

## 2023-05-12 DIAGNOSIS — M2012 Hallux valgus (acquired), left foot: Secondary | ICD-10-CM | POA: Diagnosis not present

## 2023-05-12 DIAGNOSIS — M2022 Hallux rigidus, left foot: Secondary | ICD-10-CM

## 2023-05-12 DIAGNOSIS — M19079 Primary osteoarthritis, unspecified ankle and foot: Secondary | ICD-10-CM | POA: Diagnosis not present

## 2023-05-12 DIAGNOSIS — M79672 Pain in left foot: Secondary | ICD-10-CM | POA: Diagnosis not present

## 2023-05-12 DIAGNOSIS — M79671 Pain in right foot: Secondary | ICD-10-CM | POA: Diagnosis not present

## 2023-05-15 ENCOUNTER — Telehealth: Payer: Self-pay | Admitting: Internal Medicine

## 2023-05-15 NOTE — Telephone Encounter (Signed)
Received form from [facility] Dr Shann Medal for patient to have  Left 1st Metatarsophalangeal joint fusion  Surgery date TBD  Fax back recommendations to [facility] [phone]

## 2023-05-16 NOTE — Telephone Encounter (Signed)
Called patient to schedule an appointment for surgery clearance and she is going to talk to her husband to see when he is going to be in town to bring her and call back to schedule. This appointment needs to be at 12:00 any day.

## 2023-05-16 NOTE — Telephone Encounter (Signed)
 Scheduled appointment

## 2023-05-19 ENCOUNTER — Encounter: Payer: Self-pay | Admitting: Orthopaedic Surgery

## 2023-05-24 ENCOUNTER — Other Ambulatory Visit (HOSPITAL_COMMUNITY): Payer: Self-pay

## 2023-05-29 ENCOUNTER — Telehealth: Payer: Self-pay | Admitting: Internal Medicine

## 2023-05-29 NOTE — Telephone Encounter (Signed)
LVM to CB to reschedule appointment, since they are calling for bad weather.

## 2023-05-30 NOTE — Telephone Encounter (Signed)
 Called patient and rescheduled.

## 2023-06-01 ENCOUNTER — Ambulatory Visit: Payer: Medicare Other | Admitting: Internal Medicine

## 2023-06-05 DIAGNOSIS — N951 Menopausal and female climacteric states: Secondary | ICD-10-CM | POA: Diagnosis not present

## 2023-06-05 DIAGNOSIS — Z01419 Encounter for gynecological examination (general) (routine) without abnormal findings: Secondary | ICD-10-CM | POA: Diagnosis not present

## 2023-06-05 DIAGNOSIS — N907 Vulvar cyst: Secondary | ICD-10-CM | POA: Diagnosis not present

## 2023-06-08 ENCOUNTER — Encounter: Payer: Self-pay | Admitting: Internal Medicine

## 2023-06-08 ENCOUNTER — Ambulatory Visit (INDEPENDENT_AMBULATORY_CARE_PROVIDER_SITE_OTHER): Payer: Medicare Other | Admitting: Internal Medicine

## 2023-06-08 VITALS — BP 110/80 | HR 78 | Ht 58.75 in | Wt 181.0 lb

## 2023-06-08 DIAGNOSIS — E119 Type 2 diabetes mellitus without complications: Secondary | ICD-10-CM

## 2023-06-08 DIAGNOSIS — Z96651 Presence of right artificial knee joint: Secondary | ICD-10-CM | POA: Diagnosis not present

## 2023-06-08 DIAGNOSIS — Z794 Long term (current) use of insulin: Secondary | ICD-10-CM

## 2023-06-08 DIAGNOSIS — M7742 Metatarsalgia, left foot: Secondary | ICD-10-CM | POA: Diagnosis not present

## 2023-06-08 DIAGNOSIS — E039 Hypothyroidism, unspecified: Secondary | ICD-10-CM

## 2023-06-08 DIAGNOSIS — M797 Fibromyalgia: Secondary | ICD-10-CM | POA: Diagnosis not present

## 2023-06-08 DIAGNOSIS — Z8669 Personal history of other diseases of the nervous system and sense organs: Secondary | ICD-10-CM | POA: Diagnosis not present

## 2023-06-08 DIAGNOSIS — Z01818 Encounter for other preprocedural examination: Secondary | ICD-10-CM | POA: Diagnosis not present

## 2023-06-08 NOTE — Progress Notes (Signed)
 Patient Care Team: Margaree Mackintosh, MD as PCP - General (Internal Medicine) Glendale Chard, DO as Consulting Physician (Neurology)  Visit Date: 06/08/23  Subjective:   Chief Complaint  Patient presents with   SURGERY CLEARANCE    Left 1st metatarsophalangeal joint fusion, surgery date TBD.   Patient ZO:XWRUEAVW Rita Gregory, Rita Gregory DOB:July 19, 1945,78 y.o. UJW:[id number]   78 y.o. Female, ambulating with a cane today, presents for Surgery Clearance. Patient has a past medical history of Osteoarthritis. Followed by Raechel Chute, whom she last saw 05/12/2023 and it was recommended that she undergo left 1st MTP fusion, dorsal midfoot exostectomy to improve her symptoms. Rates her pain in the right foot as severe, in the left is moderate but not nearly as bad as the right. Notes that due to her drug allergies/aversions she can only take Aleve and Tylenol for her pain. Expressed uncertainty when she would like to have this procedure done as she has a grandson graduating in June, but she is thinking either at the end of March or in April. Notes that she has been informed to expect an estimated recovery of a couple of months, but per other's anecdotes she is it to be several months. Mentioned some anxiety around the procedure itself, as she is afraid of "not making it off the operating table", and isn't sure what type of anesthesia will be used but would prefer general as she doesn't want to be awake during. States that due to her difficulty ambulating, she has fallen several times, the worst occurred when she tripped over a curb - sprained her right arm, injured her right shoulder, and abraded the skin above her right eyebrow.    Notes that she does still have Migraines w/ Vertigo, but frequency of these has reduced after purchasing a Derila pillow.   Past Medical History:  Diagnosis Date   ADD (attention deficit disorder)    Arthritis of left acromioclavicular joint    Asthma due to seasonal allergies     Cervical disc disorder with left arm radiculopathy of cervical region 04/20/2017   frequent headaches   Chronic pain    Dr. Danielle Dess follows   Chronically dry eyes, bilateral    Complication of anesthesia    01-24-2022  pt stated and her husband (dr Fransico Michael) pt is  very sensitive to fentyl  and other anesthesia drugs,  states she needs to be breathing, fully awake ,  prior to being discharged   Congenital defect of septal closure    previously followed by dr h. Katrinka Blazing  (last visit approx 2011 )  per echo in care everywhere 10-18-2005 possible pfo,  cardiac MRI/ MRA 11-11-2005 no evidence for significant left-to-right or right-to-left shunting at rest and does not demonstrate atrial septal defect, normal left and right atrial size w/ interatrial septum is redundant & aneurymal   Diverticulosis of colon (without mention of hemorrhage) 11/09/2007   Environmental and seasonal allergies    Epigastric hernia    Fibromyalgia    GAD (generalized anxiety disorder)    GERD (gastroesophageal reflux disease)    Hashimoto's disease    endocrinologist--- dr Lafe Garin   History of gastric ulcer    1990s   History of hypertension    Hyperlipidemia    Idiopathic postprandial hypoglycemia    followed by dr Lafe Garin   Kyphoscoliosis    40 degree bend ro right side   MDD (major depressive disorder)    Paroxysmal spells 02/13/2013   suspect hypoglycemic   Pre-diabetes  endocrinologist--- dr Lafe Garin;    (01-24-2022  pt uses Libre 2,  stated fasting blood sugar average 100s -- 200s)   Rash    01-24-2022  pt reports currenly has rash under breast , this happens intermittantly   Vestibular migraine    neurologist--- dr d. patel   Wears glasses     Allergies  Allergen Reactions   Betadine [Povidone Iodine] Other (See Comments)    Blister hands and feet     Fentanyl Shortness Of Breath    Pt has tolerated this medication 06/2015 pre and during operation. MD ok giving.  Low dose only   Morphine And Codeine  Anaphylaxis    respiratory depression    Bupropion Hcl     Dizzy, fog   Codeine Nausea And Vomiting   Demerol [Meperidine] Nausea And Vomiting   Dextromethorphan Diarrhea   Guaifenesin & Derivatives Diarrhea   Lipitor [Atorvastatin]     Myalgia   Lovaza [Omega-3-Acid Ethyl Esters (Fish)] Diarrhea   Lyrica [Pregabalin]     Fatigue and depression   Naltrexone     depression   Percodan [Oxycodone-Aspirin] Nausea And Vomiting   Polyethylene Glycol     Blurred vision, headache, and causes bright red skin irritation if on skin.   Prilosec [Omeprazole]     Hx ileus   Topamax [Topiramate]     disorientation    Valium [Diazepam]     Small doses are OK but large doses are not. Long-term use causes depression.    Versed [Midazolam]     In moderate in large doses shortness of breath, and respiratory depression    Adhesive [Tape] Rash    Skin irritation    Concerta [Methylphenidate] Palpitations   Doxycycline Rash    Pruritic rash   Lamotrigine Rash   Nickel Rash    Skin irritation    Oxycodone Nausea And Vomiting and Rash    And depression    Family History  Problem Relation Age of Onset   Ovarian cancer Mother        passed at age 87   Hypertension Mother    Heart disease Father    Kidney disease Father        Kidney cancer   Diabetes type II Father        passed at 52 yrs old   Colon polyps Father    Myasthenia gravis Father        passed from Montserrat gravis   Bipolar disorder Sister        Osteopenia   Bipolar disorder Son    Esophageal cancer Neg Hx    Rectal cancer Neg Hx    Stomach cancer Neg Hx    Colon cancer Neg Hx    Social History   Social History Narrative   Husband is Dr. Molli Knock       Right Handed    Lives in a three story home   Grandson graduating in June. Review of Systems  Musculoskeletal:  Positive for joint pain (Feet L severe > R moderate, causes difficulty ambulating).  Neurological:  Positive for dizziness (Vertigo, frequency  reduced) and headaches (Migraine, frequency reduced).  All other systems reviewed and are negative.    Objective:  Vitals: BP 110/80   Pulse 78   Ht 4' 10.75" (1.492 m)   Wt 181 lb (82.1 kg)   SpO2 98%   BMI 36.87 kg/m   Physical Exam Vitals and nursing note reviewed.  Constitutional:  General: She is not in acute distress.    Appearance: Normal appearance. She is not toxic-appearing.  HENT:     Head: Normocephalic and atraumatic.  Cardiovascular:     Rate and Rhythm: Normal rate and regular rhythm. No extrasystoles are present.    Pulses: Normal pulses.     Heart sounds: Normal heart sounds. No murmur heard.    No friction rub. No gallop.  Pulmonary:     Effort: Pulmonary effort is normal. No respiratory distress.     Breath sounds: Normal breath sounds. No wheezing or rales.  Abdominal:     Tenderness: There is abdominal tenderness in the periumbilical area.  Musculoskeletal:     Right lower leg: 1+ Pitting Edema present.     Left lower leg: 1+ Pitting Edema present.     Left foot: Deformity present.  Skin:    General: Skin is warm and dry.  Neurological:     Mental Status: She is alert and oriented to person, place, and time. Mental status is at baseline.  Psychiatric:        Mood and Affect: Mood normal.        Behavior: Behavior normal.        Thought Content: Thought content normal.        Judgment: Judgment normal.     Results:  Studies Obtained And Personally Reviewed By Me: Labs:     Component Value Date/Time   NA 139 09/29/2022 1121   K 4.7 09/29/2022 1121   CL 101 09/29/2022 1121   CO2 28 09/29/2022 1121   GLUCOSE 117 (H) 09/29/2022 1121   BUN 20 09/29/2022 1121   CREATININE 0.71 09/29/2022 1121   CALCIUM 10.0 09/29/2022 1121   PROT 7.1 09/29/2022 1121   ALBUMIN 4.2 11/22/2016 1249   AST 19 09/29/2022 1121   ALT 10 09/29/2022 1121   ALKPHOS 57 11/22/2016 1249   BILITOT 0.3 09/29/2022 1121   GFRNONAA 87 11/22/2016 1249   GFRAA >89  11/22/2016 1249    Lab Results  Component Value Date   WBC 6.9 09/29/2022   HGB 13.2 09/29/2022   HCT 40.3 09/29/2022   MCV 87.4 09/29/2022   PLT 290 09/29/2022   Lab Results  Component Value Date   CHOL 193 09/29/2022   HDL 59 09/29/2022   LDLCALC 82 09/29/2022   TRIG 391 (H) 09/29/2022   CHOLHDL 3.3 09/29/2022   Lab Results  Component Value Date   HGBA1C 6.1 (A) 01/31/2023    Lab Results  Component Value Date   TSH 0.95 01/31/2023    Assessment & Plan:   Left 1st MTP Fusion Pre-op Clearance: Incomplete - could not complete examination today as EKG machine was malfunctioning and she will contact us for when she would like to have her EKG and labs done after discussing with [facility]. Surgery TBD.   Migraines w/ Vertigo frequency of these has reduced after purchasing a Derila pillow.    I,Emily Lagle,acting as a Neurosurgeon for Margaree Mackintosh, MD.,have documented all relevant documentation on the behalf of Margaree Mackintosh, MD,as directed by  Margaree Mackintosh, MD while in the presence of Margaree Mackintosh, MD.   I, Margaree Mackintosh, MD, have reviewed all documentation for this visit. The documentation on 06/09/23 for the exam, diagnosis, procedures, and orders are all accurate and complete.

## 2023-06-09 ENCOUNTER — Ambulatory Visit
Admission: RE | Admit: 2023-06-09 | Discharge: 2023-06-09 | Disposition: A | Discharge status: Home / Self Care | Payer: Medicare Other | Source: Ambulatory Visit | Attending: Orthopaedic Surgery | Admitting: Orthopaedic Surgery

## 2023-06-09 DIAGNOSIS — M2012 Hallux valgus (acquired), left foot: Secondary | ICD-10-CM | POA: Diagnosis not present

## 2023-06-09 DIAGNOSIS — M2022 Hallux rigidus, left foot: Secondary | ICD-10-CM

## 2023-06-09 DIAGNOSIS — M19072 Primary osteoarthritis, left ankle and foot: Secondary | ICD-10-CM | POA: Diagnosis not present

## 2023-06-09 DIAGNOSIS — M79672 Pain in left foot: Secondary | ICD-10-CM | POA: Diagnosis not present

## 2023-06-28 DIAGNOSIS — G43109 Migraine with aura, not intractable, without status migrainosus: Secondary | ICD-10-CM | POA: Diagnosis not present

## 2023-07-13 ENCOUNTER — Ambulatory Visit
Admission: RE | Admit: 2023-07-13 | Discharge: 2023-07-13 | Disposition: A | Discharge status: Home / Self Care | Payer: Medicare Other | Source: Ambulatory Visit | Attending: Internal Medicine | Admitting: Internal Medicine

## 2023-07-13 DIAGNOSIS — N958 Other specified menopausal and perimenopausal disorders: Secondary | ICD-10-CM | POA: Diagnosis not present

## 2023-07-13 DIAGNOSIS — E2839 Other primary ovarian failure: Secondary | ICD-10-CM | POA: Diagnosis not present

## 2023-07-13 DIAGNOSIS — Z90722 Acquired absence of ovaries, bilateral: Secondary | ICD-10-CM | POA: Diagnosis not present

## 2023-07-14 DIAGNOSIS — M2022 Hallux rigidus, left foot: Secondary | ICD-10-CM | POA: Diagnosis not present

## 2023-07-14 DIAGNOSIS — M19072 Primary osteoarthritis, left ankle and foot: Secondary | ICD-10-CM | POA: Diagnosis not present

## 2023-07-14 DIAGNOSIS — M2012 Hallux valgus (acquired), left foot: Secondary | ICD-10-CM | POA: Diagnosis not present

## 2023-07-25 ENCOUNTER — Other Ambulatory Visit: Payer: Self-pay | Admitting: Internal Medicine

## 2023-08-01 ENCOUNTER — Ambulatory Visit: Payer: Medicare Other | Admitting: Internal Medicine

## 2023-08-15 ENCOUNTER — Other Ambulatory Visit: Payer: Self-pay | Admitting: Internal Medicine

## 2023-08-24 ENCOUNTER — Other Ambulatory Visit: Payer: Self-pay | Admitting: Family

## 2023-08-28 ENCOUNTER — Telehealth: Payer: Self-pay

## 2023-08-28 NOTE — Telephone Encounter (Signed)
 Pharmacy Patient Advocate Encounter   Received notification from CoverMyMeds that prior authorization for Freestyle libre 3 plus is required/requested.   Insurance verification completed.   The patient is insured through [employer] .   Pt does not have a diabetes diagnosis and is not on insulin . PA will not be approved.

## 2023-09-26 ENCOUNTER — Other Ambulatory Visit

## 2023-09-26 ENCOUNTER — Ambulatory Visit: Payer: Self-pay | Admitting: Internal Medicine

## 2023-09-26 ENCOUNTER — Ambulatory Visit: Admitting: Internal Medicine

## 2023-09-26 DIAGNOSIS — Z794 Long term (current) use of insulin: Secondary | ICD-10-CM | POA: Diagnosis not present

## 2023-09-26 DIAGNOSIS — E039 Hypothyroidism, unspecified: Secondary | ICD-10-CM | POA: Diagnosis not present

## 2023-09-26 DIAGNOSIS — E119 Type 2 diabetes mellitus without complications: Secondary | ICD-10-CM | POA: Diagnosis not present

## 2023-09-26 DIAGNOSIS — Z01812 Encounter for preprocedural laboratory examination: Secondary | ICD-10-CM | POA: Diagnosis not present

## 2023-09-26 LAB — CBC WITH DIFFERENTIAL/PLATELET
Absolute Lymphocytes: 2064 {cells}/uL (ref 850–3900)
Absolute Monocytes: 422 {cells}/uL (ref 200–950)
Basophils Absolute: 40 {cells}/uL (ref 0–200)
Basophils Relative: 0.6 %
Eosinophils Absolute: 107 {cells}/uL (ref 15–500)
Eosinophils Relative: 1.6 %
HCT: 42.2 % (ref 35.0–45.0)
Hemoglobin: 13.8 g/dL (ref 11.7–15.5)
MCH: 30.1 pg (ref 27.0–33.0)
MCHC: 32.7 g/dL (ref 32.0–36.0)
MCV: 92.1 fL (ref 80.0–100.0)
MPV: 9.9 fL (ref 7.5–12.5)
Monocytes Relative: 6.3 %
Neutro Abs: 4067 {cells}/uL (ref 1500–7800)
Neutrophils Relative %: 60.7 %
Platelets: 269 10*3/uL (ref 140–400)
RBC: 4.58 10*6/uL (ref 3.80–5.10)
RDW: 12.7 % (ref 11.0–15.0)
Total Lymphocyte: 30.8 %
WBC: 6.7 10*3/uL (ref 3.8–10.8)

## 2023-09-26 LAB — COMPLETE METABOLIC PANEL WITHOUT GFR
AG Ratio: 1.5 (calc) (ref 1.0–2.5)
ALT: 12 U/L (ref 6–29)
AST: 15 U/L (ref 10–35)
Albumin: 4.1 g/dL (ref 3.6–5.1)
Alkaline phosphatase (APISO): 68 U/L (ref 37–153)
BUN: 22 mg/dL (ref 7–25)
CO2: 25 mmol/L (ref 20–32)
Calcium: 9.4 mg/dL (ref 8.6–10.4)
Chloride: 103 mmol/L (ref 98–110)
Creat: 0.76 mg/dL (ref 0.60–1.00)
Globulin: 2.7 g/dL (ref 1.9–3.7)
Glucose, Bld: 108 mg/dL — ABNORMAL HIGH (ref 65–99)
Potassium: 4.3 mmol/L (ref 3.5–5.3)
Sodium: 139 mmol/L (ref 135–146)
Total Bilirubin: 0.3 mg/dL (ref 0.2–1.2)
Total Protein: 6.8 g/dL (ref 6.1–8.1)

## 2023-09-26 NOTE — Addendum Note (Signed)
 Addended by: Darlene Bartelt P on: 09/26/2023 12:04 PM   Modules accepted: Orders

## 2023-09-27 ENCOUNTER — Other Ambulatory Visit: Payer: Self-pay

## 2023-09-27 ENCOUNTER — Encounter (HOSPITAL_BASED_OUTPATIENT_CLINIC_OR_DEPARTMENT_OTHER): Payer: Self-pay | Admitting: Orthopaedic Surgery

## 2023-09-27 LAB — HEMOGLOBIN A1C
Hgb A1c MFr Bld: 6 % — ABNORMAL HIGH (ref <5.7)
Mean Plasma Glucose: 126 mg/dL
eAG (mmol/L): 7 mmol/L

## 2023-09-27 LAB — MICROALBUMIN / CREATININE URINE RATIO
Creatinine, Urine: 272 mg/dL (ref 20–275)
Microalb Creat Ratio: 29 mg/g{creat} (ref <30)
Microalb, Ur: 7.8 mg/dL

## 2023-09-27 LAB — T3, FREE: T3, Free: 2.7 pg/mL (ref 2.3–4.2)

## 2023-09-27 LAB — T4, FREE: Free T4: 1.2 ng/dL (ref 0.8–1.8)

## 2023-09-27 LAB — TSH: TSH: 1.55 m[IU]/L (ref 0.40–4.50)

## 2023-10-02 NOTE — Discharge Instructions (Signed)
 Netta Cedars, MD [facility]  Please read the following information regarding your care after surgery.  Medications  You only need a prescription for the narcotic pain medicine (ex. oxycodone, Percocet, Norco).  All of the other medicines listed below are available over the counter. ? Aleve 2 pills twice a day for the first 3 days after surgery. ? acetominophen (Tylenol) 650 mg every 4-6 hours as you need for minor to moderate pain ? oxycodone as prescribed for severe pain  ? To help prevent blood clots, take aspirin (81 mg) twice daily for 28 days after surgery.  You should also get up every hour while you are awake to move around.  Weight Bearing ? Once your nerve block is completely worn off, OK to HEEL weightbear in postop shoe on the operated leg or foot. This means do NOT touch the front of your surgical leg to the ground!  Cast / Splint / Dressing ? If you have a dressing, do NOT remove this. Keep your splint, cast or dressing clean and dry.  Don't put anything (coat hanger, pencil, etc) down inside of it.  If it gets wet, call the office immediately to schedule an appointment for a cast change.  Swelling IMPORTANT: It is normal for you to have swelling where you had surgery. To reduce swelling and pain, keep at least 3 pillows under your leg so that your toes are above your nose and your heel is above the level of your hip.  It may be necessary to keep your foot or leg elevated for several weeks.  This is critical to helping your incisions heal and your pain to feel better.  Follow Up Call my office at [phone] when you are discharged from the hospital or surgery center to schedule an appointment to be seen 7-10 days after surgery.  Call my office at [phone] if you develop a fever >101.5 F, nausea, vomiting, bleeding from the surgical site or severe pain.

## 2023-10-02 NOTE — H&P (Signed)
 ORTHOPAEDIC SURGERY H&P  Subjective:  The patient presents for left 1st MTP fusion, dorsal midfoot exostectomy.   Past Medical History:  Diagnosis Date   ADD (attention deficit disorder)    Arthritis of left acromioclavicular joint    Asthma due to seasonal allergies    Cervical disc disorder with left arm radiculopathy of cervical region 04/20/2017   frequent headaches   Chronic pain    Dr. Colon follows   Chronically dry eyes, bilateral    Complication of anesthesia    01-24-2022  pt stated and her husband (dr hershal) pt is  very sensitive to fentyl  and other anesthesia drugs,  states she needs to be breathing, fully awake ,  prior to being discharged   Congenital defect of septal closure    previously followed by dr h. claudene  (last visit approx 2011 )  per echo in care everywhere 10-18-2005 possible pfo,  cardiac MRI/ MRA 11-11-2005 no evidence for significant left-to-right or right-to-left shunting at rest and does not demonstrate atrial septal defect, normal left and right atrial size w/ interatrial septum is redundant & aneurymal   Diverticulosis of colon (without mention of hemorrhage) 11/09/2007   Environmental and seasonal allergies    Epigastric hernia    Fibromyalgia    GAD (generalized anxiety disorder)    GERD (gastroesophageal reflux disease)    Hashimoto's disease    endocrinologist--- dr vianne   History of gastric ulcer    1990s   History of hypertension    Hyperlipidemia    Idiopathic postprandial hypoglycemia    followed by dr vianne   Kyphoscoliosis    40 degree bend ro right side   MDD (major depressive disorder)    Paroxysmal spells 02/13/2013   suspect hypoglycemic   Pre-diabetes    endocrinologist--- dr vianne;    (01-24-2022  pt uses Libre 2,  stated fasting blood sugar average 100s -- 200s)   Rash    01-24-2022  pt reports currenly has rash under breast , this happens intermittantly   Vestibular migraine    neurologist--- dr d. patel   Wears  glasses     Past Surgical History:  Procedure Laterality Date   ANTERIOR LATERAL LUMBAR FUSION 4 LEVELS Left 06/16/2015   Procedure: ANTERIOR LATERAL LUMBAR FUSION THORACIC TWELVE-LUMBAR FOUR;  Surgeon: Victory Colon, MD;  Location: [facility];  Service: Neurosurgery;  Laterality: Left;  Thoracolumbar spine   CESAREAN SECTION     1972  and 1974   CHOLECYSTECTOMY, LAPAROSCOPIC  1992   COLONOSCOPY WITH ESOPHAGOGASTRODUODENOSCOPY (EGD)  03/23/2020   dr abran COPES HERNIA REPAIR N/A 01/27/2022   Procedure: OPEN EPIGASTRIC HERNIA REPAIR WITH MESH PATCH;  Surgeon: Eletha Boas, MD;  Location: [facility];  Service: General;  Laterality: N/A;   ESOPHAGOGASTRODUODENOSCOPY  02/23/2012   Procedure: ESOPHAGOGASTRODUODENOSCOPY (EGD);  Surgeon: Norleen LOISE abran, MD;  Location: [facility];  Service: Endoscopy;  Laterality: N/A;   KNEE ARTHROSCOPY W/ ACL RECONSTRUCTION Right 2003   approx   KNEE ARTHROSCOPY W/ MENISCAL REPAIR Right 04/17/2006   @[facility]  by dr jane   POSTERIOR LUMBAR FUSION 4 LEVEL N/A 06/16/2015   Procedure: POSTERIOR LUMBAR FUSION LUMBAR FOUR-FIVE LUMBAR FIVE-SACRAL ONE;  Surgeon: Victory Colon, MD;  Location: [facility];  Service: Neurosurgery;  Laterality: N/A;   RADIOLOGY WITH ANESTHESIA N/A 06/15/2015   Procedure: RADIOLOGY WITH ANESTHESIA;  Surgeon: Medication Radiologist, MD;  Location: [facility];  Service: Radiology;  Laterality: N/A;   SHOULDER ARTHROSCOPY Left  04/28/2017   Procedure: ARTHROSCOPY SHOULDER WITH EXTENSIVE DEBRIDEMENT ROTATOR CUFF TEAR;  Surgeon: Jane Charleston, MD;  Location: [facility];  Service: Orthopedics;  Laterality: Left;   SHOULDER ARTHROSCOPY WITH ROTATOR CUFF REPAIR Right 06/05/2009   @[facility]  by dr jane   TOTAL ABDOMINAL HYSTERECTOMY W/ BILATERAL SALPINGOOPHORECTOMY Bilateral 1988   per pt and Appendectomy   TOTAL KNEE ARTHROPLASTY Right 12/22/2008   @[facility] by dr wallis;    intraoperatively repair popliteal artery by dr  fields     (Not in an outpatient encounter)    Allergies  Allergen Reactions   Betadine  [Povidone Iodine ] Other (See Comments)    Blister hands and feet     Fentanyl  Shortness Of Breath    Pt has tolerated this medication 06/2015 pre and during operation. MD ok giving.  Low dose only   Morphine  And Codeine Anaphylaxis    respiratory depression    Bupropion Hcl     Dizzy, fog   Codeine Nausea And Vomiting   Demerol [Meperidine] Nausea And Vomiting   Dextromethorphan Diarrhea   Guaifenesin  & Derivatives Diarrhea   Lipitor [Atorvastatin]     Myalgia   Lovaza [Omega-3-Acid Ethyl Esters (Fish)] Diarrhea   Lyrica [Pregabalin]     Fatigue and depression   Naltrexone     depression   Percodan [Oxycodone -Aspirin] Nausea And Vomiting   Polyethylene Glycol     Blurred vision, headache, and causes bright red skin irritation if on skin.   Prilosec [Omeprazole]     Hx ileus   Topamax [Topiramate]     disorientation    Valium  [Diazepam ]     Small doses are OK but large doses are not. Long-term use causes depression.    Versed  [Midazolam ]     In moderate in large doses shortness of breath, and respiratory depression    Adhesive [Tape] Rash    Skin irritation    Concerta  [Methylphenidate ] Palpitations   Doxycycline Rash    Pruritic rash   Lamotrigine Rash   Nickel Rash    Skin irritation    Oxycodone  Nausea And Vomiting and Rash    And depression    Social History   Socioeconomic History   Marital status: Married    Spouse name: Ozell   Number of children: Not on file   Years of education: Not on file   Highest education level: Not on file  Occupational History   Occupation: Engineering geologist: [employer]  Tobacco Use   Smoking status: Never    Passive exposure: Yes   Smokeless tobacco: Never   Tobacco comments:    Flight attentent for 7 years. [id number] Passive smoker  Vaping Use   Vaping status: Never Used  Substance and Sexual Activity   Alcohol use: Not  Currently    Comment: Very rarely    Drug use: Never   Sexual activity: Yes    Partners: Male    Birth control/protection: Surgical  Other Topics Concern   Not on file  Social History Narrative   Husband is Dr. Ozell Crouch       Right Handed    Lives in a three story home    Social Drivers of Health   Financial Resource Strain: Not on file  Food Insecurity: Low Risk  (12/09/2022)   Received from [facility]   Hunger Vital Sign    Within the past 12 months, you worried that your food would run out before you got money to buy more:  Never true    Within the past 12 months, the food you bought just didn't last and you didn't have money to get more. : Never true  Transportation Needs: No Transportation Needs (12/09/2022)   Received from [facility]    In the past 12 months, has lack of reliable transportation kept you from medical appointments, meetings, work or from getting things needed for daily living? : No  Physical Activity: Not on file  Stress: Not on file  Social Connections: Not on file  Intimate Partner Violence: Not on file     History reviewed. No pertinent family history.   Review of Systems Pertinent items are noted in HPI.  Objective: Vital signs in last 24 hours:    09/27/2023    3:51 PM 06/08/2023   12:02 PM 01/31/2023    2:30 PM  Vitals with BMI  Height 4' 10 4' 10.75 4' 10.75  Weight 171 lbs 181 lbs 178 lbs 6 oz  BMI 35.75 36.88 36.35  Systolic  110 120  Diastolic  80 60  Pulse  78 88      EXAM: General: Well nourished, well developed. Awake, alert and oriented to time, place, person. Normal mood and affect. No apparent distress. Breathing room air.  Operative Lower Extremity: Alignment - Neutral Deformity - None Skin intact Tenderness to palpation - left 1st MTP and midfoot 5/5 TA, PT, GS, Per, EHL, FHL Sensation intact to light touch throughout Palpable DP and PT pulses Special testing: None  The  contralateral foot/ankle was examined for comparison and noted to be neurovascularly intact with no localized deformity, swelling, or tenderness.  Imaging Review All images taken were independently reviewed by me.  Assessment/Plan: The clinical and radiographic findings were reviewed and discussed at length with the patient.  The patient presents for left 1st MTP fusion, dorsal midfoot exostectomy.  We spoke at length about the natural course of these findings. We discussed nonoperative and operative treatment options in detail.  The risks and benefits were presented and reviewed. The risks due to hardware failure/irritation, new/persistent/recurrent infection, stiffness, nerve/vessel/tendon injury, nonunion/malunion of any fracture, wound healing issues, allograft usage, development of arthritis, failure of this surgery, possibility of external fixation in certain situations, possibility of delayed definitive surgery, need for further surgery, prolonged wound care including further soft tissue coverage procedures, thromboembolic events, anesthesia/medical complications/events perioperatively and beyond, amputation, death among others were discussed. The patient acknowledged the explanation and agreed to proceed with the plan.  Lillia Mountain  Orthopaedic Surgery [facility]

## 2023-10-03 ENCOUNTER — Encounter: Payer: Self-pay | Admitting: Internal Medicine

## 2023-10-03 ENCOUNTER — Ambulatory Visit (INDEPENDENT_AMBULATORY_CARE_PROVIDER_SITE_OTHER): Admitting: Internal Medicine

## 2023-10-03 VITALS — BP 122/76 | HR 77 | Ht <= 58 in | Wt 170.0 lb

## 2023-10-03 DIAGNOSIS — R7303 Prediabetes: Secondary | ICD-10-CM | POA: Diagnosis not present

## 2023-10-03 DIAGNOSIS — E782 Mixed hyperlipidemia: Secondary | ICD-10-CM

## 2023-10-03 DIAGNOSIS — E063 Autoimmune thyroiditis: Secondary | ICD-10-CM

## 2023-10-03 DIAGNOSIS — R7989 Other specified abnormal findings of blood chemistry: Secondary | ICD-10-CM | POA: Diagnosis not present

## 2023-10-03 MED ORDER — FREESTYLE LITE TEST VI STRP: ORAL_STRIP | 12 refills | Status: AC

## 2023-10-03 MED ORDER — FREESTYLE LIBRE 3 PLUS SENSOR MISC: 1.0000 | 3 refills | Status: DC

## 2023-10-03 NOTE — Progress Notes (Signed)
 Patient ID: Rita Gregory, female   DOB: [birth year], 78 y.o.   MRN: [id number]  HPI: Rita Gregory is a 78 y.o. female, initially self-referred, returning for follow-up for prediabetes, since early 2000s, and suspected hypoglycemic spells, Hashimoto's hypothyroidism.  She is a former diabetes Programmer, systems. her husband, Dr. Ozell Gregory, is also my patient. She previously saw Dr. Mirna before he retired.  Last visit with me 8 months ago.  Interim history: No chest pain, shortness of breath. She has increased urination. Has a history of migraines, which have resolved after changing her pillow. She has foot surgery tomorrow - Ortho. She is on Wegovy  0.25 mg weekly - tolerating well. She lost 14 lbs since last OV. Off for last 2 weeks b/c surgery.   Prediabetes:  Reviewed history: Patient describes that she has longstanding sensitivity to blood sugar variations even within the normal range.    She describes that after she had attached a freestyle libre CGM, she realized that her blood sugars did not actually drop as low as she thought when she was feeling hypoglycemic, however, she does describe significant hypoglycemic symptoms including slurring of words and disorientation when sugars go to 68-70.  When sugars get to around 80-90, she describes heartburn, vision changes, headaches, extreme fatigue, dizziness.  This happens mostly during the day, after meals.  She corrects them with 2 glucose tablets.  She also feels the needs to eat ice cream at night to raise her glucose around 115-120 when going to bed, to avoid lows overnight.  Before our visit in 12/2020 had to change her sensor targets from 90 to 85 and then to 80 as it was alarming her too much as she could not sleep.  In 07/2022, she had a period of lower blood sugars, in the 70s, but they improved.  She prefers to keep her blood sugars higher than 100 at all times.  Reviewed patient's HbA1c levels: Lab Results   Component Value Date   HGBA1C 6.0 (H) 09/26/2023   HGBA1C 6.1 (A) 01/31/2023   HGBA1C 6.3 (H) 09/29/2022   HGBA1C 5.9 (A) 07/12/2022   HGBA1C 6.0 (A) 01/07/2022   HGBA1C 6.0 (A) 08/05/2021   HGBA1C 5.8 (A) 01/07/2021   HGBA1C 6.2 (H) 11/03/2020   HGBA1C 5.8 (H) 11/22/2016   Patient was started on 500 mg of metformin  ER by Dr. Mirna in 08/2020, but she stopped due to concern for hypoglycemia. Pt is currently not on any medications for her prediabetes.  She is checking her blood sugars with a CGM:   Prev.:   Previously:   Lowest blood sugars: 65 >> 70s >> >70 >> 75. Hypoglycemia awareness at 70. Highest blood sugar: 209 >> 202.  Pt's meals are: - Brunch: Half a cup of great grain cereal with 10 pecans and 10 for almonds, + half-and-half - Dinner: 4 to 6 ounces of meat or fish -usually present - Snacks: 1-2 -including ice cream at night  - no CKD; last BUN/creatinine:  Lab Results  Component Value Date   BUN 22 09/26/2023   BUN 20 09/29/2022   CREATININE 0.76 09/26/2023   CREATININE 0.71 09/29/2022   Lab Results  Component Value Date   MICRALBCREAT 29 09/26/2023   MICRALBCREAT 28 09/29/2022   MICRALBCREAT 17 01/25/2022   MICRALBCREAT 15 11/03/2020  11/03/2020: ACR 15 She is not on ARB or ACE inhibitor.  -+ HL; last set of lipids: Lab Results  Component Value Date   CHOL 193 09/29/2022   HDL  59 09/29/2022   LDLCALC 82 09/29/2022   TRIG 391 (H) 09/29/2022   CHOLHDL 3.3 09/29/2022  On pravastatin  40 mg daily and Vascepa  2 g twice a day -started before our visit from 12/2020, but now not covered by insurance due to absence of cardiovascular disease >> now pays out of pocket. Now 1 mg 2x a day 2/2 diarrhea. She may have been off the med in 09/2022.   - last eye exam was 08/30/2022. No DR. + dry macular degeneration, dry eyes.    - no numbness and tingling in her feet.  Latest foot exam 10/04/2022.   Pt has FH of DM in father - DM2.   Hypothyroidism: In  12/2022, she believed she had an episode of thyroiditis: Neck pain, thyroid  fullness on palpation.  This resolved.  Pt is on levothyroxine  50 mcg 6/7 days and 100 mcg 1/7 days, increase due to hair loss: - at night - fasting - at least 30 min from b'fast - no calcium - no iron  - + multivitamins - usually at night but occasionally in am - + PPIs at night - not on Biotin  TFTs reviewed: Lab Results  Component Value Date   TSH 1.55 09/26/2023   TSH 0.95 01/31/2023   TSH 2.34 09/29/2022   TSH 1.25 07/12/2022   TSH 3.88 01/07/2022   TSH 1.60 08/05/2021   TSH 0.63 05/21/2021   TSH 1.13 02/18/2021   TSH 4.00 11/03/2020   TSH 2.17 11/22/2016   She also has a history of a tiny thyroid  nodule: 08/30/2007: Thyroid  ultrasound: Findings: Thyroid  gland is lower limits of normal in size with the  right lobe measuring 3.3 cm long X 1.2 cm AP X 1.1 cm wide.  Left  lobe measures 3.9 cm long X 1.0 cm AP X 1.3 cm wide.  Isthmus  measures 2 mm AP.  Thyroid  echotexture is homogeneous.  At the  medial mid left lobe the is cystic focus measuring 5 mm long X 3 mm  AP X 3 mm wide containing tiny anterior nonshadowing mural  echogenicity.     IMPRESSION:  1. Solitary 5 mm slightly complex cyst at the medial mid left lobe  thyroid  favoring cystic adenoma.  2.  Otherwise, negative.    Pt denies: - feeling nodules in neck - hoarseness - dysphagia - choking  Reviewed latest vitamin D  levels: Lab Results  Component Value Date   VD25OH 64.19 01/31/2023   VD25OH 77.11 07/12/2022   VD25OH >120 01/07/2022   VD25OH 98.09 08/05/2021   VD25OH 101 (H) 11/03/2020   VD25OH 69 11/22/2016  She was previously on ergocalciferol  50,000 units weekly.  I previously advised her to take vitamin D3 1000 units daily but she had ergocalciferol  at home and switched to 50,000 units every 2 weeks.  Afterwards, we stopped the vitamin D  supplements .- only takes MVI.  A B12 level was normal: Lab Results  Component  Value Date   VITAMINB12 911 11/03/2020   She also has a history of: ADHD-on Adderall Chronic pain syndrome Chronic fatigue Fibromyalgia-sees rheumatology -on methylphenidate  patch for this and for ADD GERD HTN Back pain and shoulder pain-status post spinal surgery - Dr. Colon Osteoarthritis Migraines Dry eyes-on Xiidra  and Systain - possibly Sjogren increased urination (has stress incontinence - stable) She was referred to [facility] since last visit (Dr. Katrinka) for vertigo. She also had epigastric hernia surgery in 01/2022.  ROS: + see HPI  Past Medical History:  Diagnosis Date   ADD (  attention deficit disorder)    Arthritis of left acromioclavicular joint    Asthma due to seasonal allergies    Cervical disc disorder with left arm radiculopathy of cervical region 04/20/2017   frequent headaches   Chronic pain    Dr. Colon follows   Chronically dry eyes, bilateral    Complication of anesthesia    01-24-2022  pt stated and her husband (dr hershal) pt is  very sensitive to fentyl  and other anesthesia drugs,  states she needs to be breathing, fully awake ,  prior to being discharged   Congenital defect of septal closure    previously followed by dr h. claudene  (last visit approx 2011 )  per echo in care everywhere 10-18-2005 possible pfo,  cardiac MRI/ MRA 11-11-2005 no evidence for significant left-to-right or right-to-left shunting at rest and does not demonstrate atrial septal defect, normal left and right atrial size w/ interatrial septum is redundant & aneurymal   Diverticulosis of colon (without mention of hemorrhage) 11/09/2007   Environmental and seasonal allergies    Epigastric hernia    Fibromyalgia    GAD (generalized anxiety disorder)    GERD (gastroesophageal reflux disease)    Hashimoto's disease    endocrinologist--- dr vianne   History of gastric ulcer    1990s   History of hypertension    Hyperlipidemia    Idiopathic postprandial hypoglycemia     followed by dr vianne   Kyphoscoliosis    40 degree bend ro right side   MDD (major depressive disorder)    Paroxysmal spells 02/13/2013   suspect hypoglycemic   Pre-diabetes    endocrinologist--- dr vianne;    (01-24-2022  pt uses Libre 2,  stated fasting blood sugar average 100s -- 200s)   Rash    01-24-2022  pt reports currenly has rash under breast , this happens intermittantly   Vestibular migraine    neurologist--- dr d. patel   Wears glasses    Past Surgical History:  Procedure Laterality Date   ANTERIOR LATERAL LUMBAR FUSION 4 LEVELS Left 06/16/2015   Procedure: ANTERIOR LATERAL LUMBAR FUSION THORACIC TWELVE-LUMBAR FOUR;  Surgeon: Victory Colon, MD;  Location: [facility];  Service: Neurosurgery;  Laterality: Left;  Thoracolumbar spine   CESAREAN SECTION     1972  and 1974   CHOLECYSTECTOMY, LAPAROSCOPIC  1992   COLONOSCOPY WITH ESOPHAGOGASTRODUODENOSCOPY (EGD)  03/23/2020   dr abran COPES HERNIA REPAIR N/A 01/27/2022   Procedure: OPEN EPIGASTRIC HERNIA REPAIR WITH MESH PATCH;  Surgeon: Eletha Boas, MD;  Location: [facility];  Service: General;  Laterality: N/A;   ESOPHAGOGASTRODUODENOSCOPY  02/23/2012   Procedure: ESOPHAGOGASTRODUODENOSCOPY (EGD);  Surgeon: Norleen LOISE abran, MD;  Location: [facility];  Service: Endoscopy;  Laterality: N/A;   KNEE ARTHROSCOPY W/ ACL RECONSTRUCTION Right 2003   approx   KNEE ARTHROSCOPY W/ MENISCAL REPAIR Right 04/17/2006   @[facility]  by dr jane   POSTERIOR LUMBAR FUSION 4 LEVEL N/A 06/16/2015   Procedure: POSTERIOR LUMBAR FUSION LUMBAR FOUR-FIVE LUMBAR FIVE-SACRAL ONE;  Surgeon: Victory Colon, MD;  Location: [facility];  Service: Neurosurgery;  Laterality: N/A;   RADIOLOGY WITH ANESTHESIA N/A 06/15/2015   Procedure: RADIOLOGY WITH ANESTHESIA;  Surgeon: Medication Radiologist, MD;  Location: [facility];  Service: Radiology;  Laterality: N/A;   SHOULDER ARTHROSCOPY Left 04/28/2017   Procedure: ARTHROSCOPY SHOULDER WITH  EXTENSIVE DEBRIDEMENT ROTATOR CUFF TEAR;  Surgeon: jane Charleston, MD;  Location: [facility];  Service: Orthopedics;  Laterality:  Left;   SHOULDER ARTHROSCOPY WITH ROTATOR CUFF REPAIR Right 06/05/2009   @[facility]  by dr jane   TOTAL ABDOMINAL HYSTERECTOMY W/ BILATERAL SALPINGOOPHORECTOMY Bilateral 1988   per pt and Appendectomy   TOTAL KNEE ARTHROPLASTY Right 12/22/2008   @[facility] by dr wallis;    intraoperatively repair popliteal artery by dr fields   Social History   Socioeconomic History   Marital status: Married    Spouse name: Rita   Number of children: Not on file   Years of education: Not on file   Highest education level: Not on file  Occupational History   Occupation: Engineering geologist: [employer]  Tobacco Use   Smoking status: Never    Passive exposure: Yes   Smokeless tobacco: Never   Tobacco comments:    Flight attentent for 7 years. [id number] Passive smoker  Vaping Use   Vaping status: Never Used  Substance and Sexual Activity   Alcohol use: Not Currently    Comment: Very rarely    Drug use: Never   Sexual activity: Yes    Partners: Male    Birth control/protection: Surgical  Other Topics Concern   Not on file  Social History Narrative   Husband is Dr. Ozell Gregory       Right Handed    Lives in a three story home    Social Drivers of Health   Financial Resource Strain: Not on file  Food Insecurity: Low Risk  (12/09/2022)   Received from [facility]   Hunger Vital Sign    Within the past 12 months, you worried that your food would run out before you got money to buy more: Never true    Within the past 12 months, the food you bought just didn't last and you didn't have money to get more. : Never true  Transportation Needs: No Transportation Needs (12/09/2022)   Received from [facility]    In the past 12 months, has lack of reliable transportation kept you from medical appointments, meetings, work or from getting things  needed for daily living? : No  Physical Activity: Not on file  Stress: Not on file  Social Connections: Not on file  Intimate Partner Violence: Not on file   Current Outpatient Medications on File Prior to Visit  Medication Sig Dispense Refill   acetaminophen  (TYLENOL ) 650 MG CR tablet Take 650 mg by mouth every 8 (eight) hours as needed for pain.     albuterol  (VENTOLIN  HFA) 108 (90 Base) MCG/ACT inhaler Inhale 2 puffs into the lungs every 4 (four) hours as needed for wheezing 18 g 5   ALPRAZolam  (XANAX ) 0.5 MG tablet One half to one tablet twice daily as needed for vertigo 60 tablet 0   conjugated estrogens  (PREMARIN ) vaginal cream Place 0.625 mg vaginally daily as needed.      Continuous Glucose Sensor (DEXCOM G7 SENSOR) MISC 3 each by Does not apply route every 30 (thirty) days. Apply 1 sensor every 10 days 9 each 3   Continuous Glucose Sensor (FREESTYLE LIBRE 3 PLUS SENSOR) MISC 1 each by Does not apply route every 14 (fourteen) days. 6 each 3   Continuous Glucose Sensor (FREESTYLE LIBRE 3 SENSOR) MISC USE EVERY 14 DAYS AS DIRECTED 2 each 2   Cyanocobalamin (B-12 PO) Take by mouth at bedtime.     EPINEPHRINE  0.3 mg/0.3 mL IJ SOAJ injection USE AS DIRECTED FOR ALLERGIC REACTION 2 each 6   escitalopram  (LEXAPRO ) 10 MG tablet  Take 10-15 mg by mouth 2 (two) times daily.     estrogens , conjugated, (PREMARIN ) 0.45 MG tablet Take 1 tablet (0.45 mg total) by mouth daily. (Patient taking differently: Take 0.45 mg by mouth at bedtime.) 90 tablet 3   glucose blood (FREESTYLE LITE) test strip Use as instructed 2x a day 100 each 12   hydrocortisone 2.5 % cream as needed.     icosapent  Ethyl (VASCEPA ) 1 g capsule Take 2 capsules (2 g total) by mouth 2 (two) times daily with a meal. 360 capsule 4   ketoconazole  (NIZORAL ) 2 % cream APPLY 1 APPLICATION TOPICALLY 2 TIMES DAILY AS NEEDED FOR IRRITATION. 15 g 3   levothyroxine  (SYNTHROID ) 50 MCG tablet TAKE BY MOUTH 1 TABLET 6 OUT OF 7 DAYS AND 2 TABLETS 1  OUT OF 7 DAYS BEFORE BREAKFAST 105 tablet 3   Lifitegrast  (XIIDRA ) 5 % SOLN Place 1 drop into both eyes 2 (two) times daily. (Patient taking differently: Place 1 drop into both eyes 2 (two) times daily.) 900 each 4   loratadine (CLARITIN) 10 MG tablet Take 10 mg by mouth daily as needed for allergies.     meclizine (ANTIVERT) 25 MG tablet Take 50-75 mg by mouth 3 (three) times daily as needed for dizziness.     methylphenidate  (DAYTRANA ) 10 mg/9hr patch Place 1 patch (10 mg total) onto the skin daily in the afternoon as needed. 01/06/22 30 patch 0   methylphenidate  (DAYTRANA ) 15 mg/9hr Place 1 patch (15 mg total) onto the skin daily. 01/06/22 30 patch 0   miconazole (MONISTAT 7) 2 % vaginal cream Use as needed     Multiple Vitamins-Minerals (CENTRUM SILVER  PO) Take 1 tablet by mouth at bedtime. CENTRUM SILVER  GUMMY ONE A DAY     oxymetazoline (AFRIN) 0.05 % nasal spray Place into both nostrils 2 (two) times daily as needed.     pantoprazole  (PROTONIX ) 40 MG tablet TAKE 1 TABLET BY MOUTH AT BEDTIME 90 tablet 3   Polyvinyl Alcohol-Povidone (REFRESH OP) Apply to eye as needed.     pravastatin  (PRAVACHOL ) 40 MG tablet TAKE 1 TABLET BY MOUTH AT BEDTIME 90 tablet 2   Pyridoxine  HCl (B-6 PO) Take 1 capsule by mouth at bedtime.     Semaglutide -Weight Management (WEGOVY ) 0.25 MG/0.5ML SOAJ Inject 0.25 mg into the skin once a week. 6 mL 3   silver  sulfADIAZINE  (SILVADENE ) 1 % cream APPLY TO THE AFFECTED AREA(S) AS NEEDED FOR BURN AS DIRECTED 25 g 2   traZODone  (DESYREL ) 50 MG tablet Take 1-2 tablets (50-100 mg total) by mouth at bedtime as needed for sleep (Patient taking differently: Take 50-100 mg by mouth at bedtime.) 180 tablet 0   triamcinolone  (KENALOG ) 0.1 % Apply topically in the morning and at bedtime.     No current facility-administered medications on file prior to visit.   Allergies  Allergen Reactions   Betadine  [Povidone Iodine ] Other (See Comments)    Blister hands and feet     Fentanyl   Shortness Of Breath    Pt has tolerated this medication 06/2015 pre and during operation. MD ok giving.  Low dose only   Morphine  And Codeine Anaphylaxis    respiratory depression    Bupropion Hcl     Dizzy, fog   Codeine Nausea And Vomiting   Demerol [Meperidine] Nausea And Vomiting   Dextromethorphan Diarrhea   Guaifenesin  & Derivatives Diarrhea   Lipitor [Atorvastatin]     Myalgia   Lovaza [Omega-3-Acid Ethyl Esters (Fish)] Diarrhea  Lyrica [Pregabalin]     Fatigue and depression   Naltrexone     depression   Percodan [Oxycodone -Aspirin] Nausea And Vomiting   Polyethylene Glycol     Blurred vision, headache, and causes bright red skin irritation if on skin.   Prilosec [Omeprazole]     Hx ileus   Topamax [Topiramate]     disorientation    Valium  [Diazepam ]     Small doses are OK but large doses are not. Long-term use causes depression.    Versed  [Midazolam ]     In moderate in large doses shortness of breath, and respiratory depression    Adhesive [Tape] Rash    Skin irritation    Concerta  [Methylphenidate ] Palpitations   Doxycycline Rash    Pruritic rash   Lamotrigine Rash   Nickel Rash    Skin irritation    Oxycodone  Nausea And Vomiting and Rash    And depression   Family History  Problem Relation Age of Onset   Ovarian cancer Mother        passed at age 67   Hypertension Mother    Heart disease Father    Kidney disease Father        Kidney cancer   Diabetes type II Father        passed at 31 yrs old   Colon polyps Father    Myasthenia gravis Father        passed from montserrat gravis   Bipolar disorder Sister        Osteopenia   Bipolar disorder Son    Esophageal cancer Neg Hx    Rectal cancer Neg Hx    Stomach cancer Neg Hx    Colon cancer Neg Hx    PE: BP 122/76   Pulse 77   Ht 4' 10 (1.473 m)   Wt 170 lb (77.1 kg) Comment: At home scale.  SpO2 94%   BMI 35.53 kg/m   Wt Readings from Last 3 Encounters:  10/03/23 170 lb (77.1 kg)  06/08/23  181 lb (82.1 kg)  01/31/23 178 lb 6.4 oz (80.9 kg)    Constitutional: overweight, in NAD Eyes:  EOMI, no exophthalmos ENT: no neck masses, no cervical lymphadenopathy Cardiovascular: RRR, No MRG, + mild periankle edema bilaterally Respiratory: CTA B Musculoskeletal: + deformities (B feet) - see below Skin:no rashes Neurological: no tremor with outstretched hands Diabetic Foot Exam - Simple   Simple Foot Form Diabetic Foot exam was performed with the following findings: Yes 10/03/2023  9:35 AM  Visual Inspection See comments: Yes Sensation Testing See comments: Yes Pulse Check Posterior Tibialis and Dorsalis pulse intact bilaterally: Yes Comments + B hallux valgus deformity + Bone spur on the dorsum of left foot + Decreased sensation to monofilament in bilateral halluces     ASSESSMENT: 1.  Prediabetes   2.  Idiopathic postprandial syndrome   3.  Hyperlipidemia  4.  Hashimoto's hypothyroidism  5.  High Vitamin D     PLAN:  1. Patient with decreased glucose tolerance since 2002 and HbA1c levels in prediabetic range.  Her highest HbA1c was in 09/2022, at 6.3%.   -She tried metformin  before but she stopped due to presumed hypoglycemic episodes.  She started a CGM in 2021 and she mentioned that this was an eye opener and demonstrated that her episodes of hypoglycemic symptoms were not, in fact, related to low blood sugars.  When she saw Dr. Mirna for the last time in 08/2020, she was advised to resume the CGM.   -  At last visit she did not have the CGM as this was not covered by her insurance.  I printed her prescriptions for the freestyle libre 3+, 2, and the Dexcom G7.  She was determined to pay for it out-of-pocket if not covered, though. - At last visit, she was interested in trying a GLP-1 receptor agonist.  Her BMI was above 30.  She did not have a family history of medullary thyroid  cancer or personal history of pancreatitis.  I sent a prescription for low-dose Ozempic   to the pharmacy but this was not covered.  Wegovy , however, was covered for her.   -Her latest HbA1c obtained few days ago was even lower than before, at 6.0%, still in the prediabetic range.  We will not repeat this today. -CGM interpretation: -At today's visit, we reviewed her CGM downloads: It appears that 100% of values are in target range (goal >70%), while 0% are higher than 180 (goal <25%), and 0% are lower than 70 (goal <4%).  The calculated average blood sugar is 113.  The projected HbA1c for the next 3 months (GMI) is 6.0%. -Reviewing the CGM trends, sugars appear to be excellent, 100% fluctuating within the target range without hyperglycemic spikes or hypoglycemia.  She continues on Wegovy  low-dose, now tolerated well.  For now, we will continue the same dose especially as she is taking the 2-week drug holiday preparation for her surgery.  I did advise her to let me know when she is ready to increase the dose before her next visit.  Per our scale, she lost 8 pounds since last visit. - I will see her back in 6 months  2.  Idiopathic postprandial syndrome -Associated with spells with weakness, dizziness, burning sensation in her stomach, resolved after taking glucose -However, these episodes are not associated with low blood sugars.  She occasionally has dizzy spells occurring when her blood sugars are at 90, and she eats to prevent this to happen.  She may have neurologic symptoms when her sugars drop under 70.  We discussed that since these are normal sugars, it appears that she may have just an increase sensitivity to the fluctuations in the blood sugars.  Of note, she saw neurology in the past and no etiology was found for these episodes. MRI was normal.   -No medication is available for this condition, however, we discussed about dietary changes that she can institute to prevent large fluctuations in blood sugars: Eating low glycemic index foods, mostly focusing on solid foods, rather than  semisolid or liquid, no  liquids at the time of the meal but separate them by at least 30 minutes from the meal, and start the meal with protein and fat and end with carbs.  -She already has glucose tablets at hand and takes 1-2 when she feels that her sugars are dropping -We previously discussed that her freestyle libre CGM may indicate blood sugars that are 10 to 20 mg/dL lower than actually measured by her glucometer.  Therefore, whenever she suspects a low, she needs to check with her glucometer to make sure that these are real levels. -Since last visit, no significant lows - Her CGM is not covered so she paid for it out-of-pocket  3.  Hyperlipidemia - Her lipid panel was reviewed from 09/2022: Triglycerides elevated, LDL at goal Lab Results  Component Value Date   CHOL 193 09/29/2022   HDL 59 09/29/2022   LDLCALC 82 09/29/2022   TRIG 391 (H) 09/29/2022   CHOLHDL 3.3  09/29/2022  - Continues on pravastatin  40 mg daily.  Vascepa  1 g twice a day (declined by the insurance due to the fact that she does not have cardiovascular disease, but she is paying out-of-pocket).  She is using the lower dose due to diarrhea.  4.  Hashimoto's hypothyroidism - latest thyroid  labs reviewed with pt. >> normal: Lab Results  Component Value Date   TSH 1.55 09/26/2023  - she continues on LT4 50 mcg 5/7 days, but 100 mcg 1/7 days - pt feels good on this dose. - we discussed about taking the thyroid  hormone every day, with water, >30 minutes before breakfast, separated by >4 hours from acid reflux medications, calcium, iron , multivitamins. Pt. is taking it correctly. - I refilled levothyroxine  for her  5.  High vitamin D  level -She had 2 instances of high vitamin D  level, at 100 in 10/2020 and >120 in 12/2021 -She was prev. on ergocalciferol  50,000 units every 2 weeks but will change to vitamin D  only multivitamin.  On this dose, her vitamin D  level normalized: Lab Results  Component Value Date   VD25OH  64.19 01/31/2023  - Will continue with the above dose for now  Lela Fendt, MD PhD [facility]

## 2023-10-03 NOTE — Patient Instructions (Addendum)
 Please continue Levothyroxine  50 mcg 6/7 days and 100 mcg 1/7 days.  Take the thyroid  hormone every day, with water, in am, at least 30 minutes before breakfast, separated by at least 4 hours from: - acid reflux medications - calcium - iron  - multivitamins  Please continue Wegovy  0.25 mg weekly.  Please come back for a follow-up appointment in 6 months.

## 2023-10-04 ENCOUNTER — Encounter (HOSPITAL_BASED_OUTPATIENT_CLINIC_OR_DEPARTMENT_OTHER)
Admission: RE | Disposition: A | Discharge status: Home / Self Care | Payer: Self-pay | Source: Home / Self Care | Attending: Orthopaedic Surgery

## 2023-10-04 ENCOUNTER — Ambulatory Visit (HOSPITAL_BASED_OUTPATIENT_CLINIC_OR_DEPARTMENT_OTHER): Admitting: Anesthesiology

## 2023-10-04 ENCOUNTER — Ambulatory Visit (HOSPITAL_COMMUNITY)

## 2023-10-04 ENCOUNTER — Other Ambulatory Visit: Payer: Self-pay

## 2023-10-04 ENCOUNTER — Ambulatory Visit (HOSPITAL_BASED_OUTPATIENT_CLINIC_OR_DEPARTMENT_OTHER)
Admission: RE | Admit: 2023-10-04 | Discharge: 2023-10-04 | Disposition: A | Discharge status: Home / Self Care | Attending: Orthopaedic Surgery | Admitting: Orthopaedic Surgery

## 2023-10-04 ENCOUNTER — Encounter (HOSPITAL_BASED_OUTPATIENT_CLINIC_OR_DEPARTMENT_OTHER): Payer: Self-pay | Admitting: Orthopaedic Surgery

## 2023-10-04 DIAGNOSIS — J449 Chronic obstructive pulmonary disease, unspecified: Secondary | ICD-10-CM | POA: Insufficient documentation

## 2023-10-04 DIAGNOSIS — E039 Hypothyroidism, unspecified: Secondary | ICD-10-CM | POA: Diagnosis not present

## 2023-10-04 DIAGNOSIS — I10 Essential (primary) hypertension: Secondary | ICD-10-CM | POA: Diagnosis not present

## 2023-10-04 DIAGNOSIS — F419 Anxiety disorder, unspecified: Secondary | ICD-10-CM | POA: Diagnosis not present

## 2023-10-04 DIAGNOSIS — M2022 Hallux rigidus, left foot: Secondary | ICD-10-CM | POA: Diagnosis not present

## 2023-10-04 DIAGNOSIS — M797 Fibromyalgia: Secondary | ICD-10-CM | POA: Diagnosis not present

## 2023-10-04 DIAGNOSIS — M19072 Primary osteoarthritis, left ankle and foot: Secondary | ICD-10-CM | POA: Diagnosis not present

## 2023-10-04 DIAGNOSIS — F32A Depression, unspecified: Secondary | ICD-10-CM | POA: Insufficient documentation

## 2023-10-04 DIAGNOSIS — K219 Gastro-esophageal reflux disease without esophagitis: Secondary | ICD-10-CM | POA: Insufficient documentation

## 2023-10-04 DIAGNOSIS — R519 Headache, unspecified: Secondary | ICD-10-CM | POA: Diagnosis not present

## 2023-10-04 DIAGNOSIS — Z01818 Encounter for other preprocedural examination: Secondary | ICD-10-CM

## 2023-10-04 DIAGNOSIS — F418 Other specified anxiety disorders: Secondary | ICD-10-CM | POA: Diagnosis not present

## 2023-10-04 DIAGNOSIS — E785 Hyperlipidemia, unspecified: Secondary | ICD-10-CM | POA: Diagnosis not present

## 2023-10-04 DIAGNOSIS — E063 Autoimmune thyroiditis: Secondary | ICD-10-CM | POA: Diagnosis not present

## 2023-10-04 DIAGNOSIS — G8918 Other acute postprocedural pain: Secondary | ICD-10-CM | POA: Diagnosis not present

## 2023-10-04 DIAGNOSIS — M19071 Primary osteoarthritis, right ankle and foot: Secondary | ICD-10-CM | POA: Diagnosis not present

## 2023-10-04 DIAGNOSIS — Q7020 Fused toes, unspecified foot: Secondary | ICD-10-CM | POA: Diagnosis not present

## 2023-10-04 HISTORY — PX: ARTHRODESIS METATARSALPHALANGEAL JOINT (MTPJ): SHX6566

## 2023-10-04 HISTORY — PX: METATARSAL HEAD EXCISION: SHX5027

## 2023-10-04 LAB — GLUCOSE, CAPILLARY: Glucose-Capillary: 126 mg/dL — ABNORMAL HIGH (ref 70–99)

## 2023-10-04 SURGERY — FUSION, JOINT, GREAT TOE
Anesthesia: General | Site: Toe | Laterality: Left

## 2023-10-04 MED ORDER — 0.9 % SODIUM CHLORIDE (POUR BTL) OPTIME
TOPICAL | Status: DC | PRN
  Administered 2023-10-04: 200 mL

## 2023-10-04 MED ORDER — HYDROMORPHONE HCL 1 MG/ML IJ SOLN
0.2500 mg | INTRAMUSCULAR | Status: DC | PRN
  Administered 2023-10-04 (×2): 0.5 mg via INTRAVENOUS

## 2023-10-04 MED ORDER — HYDROMORPHONE HCL 1 MG/ML IJ SOLN
INTRAMUSCULAR | Status: AC
  Filled 2023-10-04: qty 0.5

## 2023-10-04 MED ORDER — BUPIVACAINE-EPINEPHRINE (PF) 0.5% -1:200000 IJ SOLN
INTRAMUSCULAR | Status: DC | PRN
  Administered 2023-10-04: 20 mL via PERINEURAL
  Administered 2023-10-04: 15 mL via PERINEURAL

## 2023-10-04 MED ORDER — PROPOFOL 10 MG/ML IV BOLUS
INTRAVENOUS | Status: AC
  Filled 2023-10-04: qty 20

## 2023-10-04 MED ORDER — CEFAZOLIN SODIUM-DEXTROSE 2-4 GM/100ML-% IV SOLN
2.0000 g | INTRAVENOUS | Status: AC
  Administered 2023-10-04: 2 g via INTRAVENOUS

## 2023-10-04 MED ORDER — EPHEDRINE SULFATE-NACL 50-0.9 MG/10ML-% IV SOSY
PREFILLED_SYRINGE | INTRAVENOUS | Status: DC | PRN
  Administered 2023-10-04 (×3): 5 mg via INTRAVENOUS
  Administered 2023-10-04: 10 mg via INTRAVENOUS

## 2023-10-04 MED ORDER — CEFAZOLIN SODIUM-DEXTROSE 2-4 GM/100ML-% IV SOLN
INTRAVENOUS | Status: AC
  Filled 2023-10-04: qty 100

## 2023-10-04 MED ORDER — FENTANYL CITRATE (PF) 100 MCG/2ML IJ SOLN
INTRAMUSCULAR | Status: DC | PRN
  Administered 2023-10-04 (×2): 50 ug via INTRAVENOUS

## 2023-10-04 MED ORDER — ONDANSETRON HCL 4 MG/2ML IJ SOLN
INTRAMUSCULAR | Status: AC
  Filled 2023-10-04: qty 2

## 2023-10-04 MED ORDER — DEXAMETHASONE SODIUM PHOSPHATE 10 MG/ML IJ SOLN
INTRAMUSCULAR | Status: AC
  Filled 2023-10-04: qty 1

## 2023-10-04 MED ORDER — MIDAZOLAM HCL 2 MG/2ML IJ SOLN: 0.5000 mg | Freq: Once | INTRAMUSCULAR | Status: DC | PRN

## 2023-10-04 MED ORDER — ACETAMINOPHEN 500 MG PO TABS
1000.0000 mg | ORAL_TABLET | Freq: Once | ORAL | Status: AC
  Administered 2023-10-04: 1000 mg via ORAL

## 2023-10-04 MED ORDER — LIDOCAINE 2% (20 MG/ML) 5 ML SYRINGE
INTRAMUSCULAR | Status: DC | PRN
  Administered 2023-10-04: 40 mg via INTRAVENOUS

## 2023-10-04 MED ORDER — LACTATED RINGERS IV SOLN: INTRAVENOUS | Status: DC

## 2023-10-04 MED ORDER — FENTANYL CITRATE (PF) 100 MCG/2ML IJ SOLN
INTRAMUSCULAR | Status: AC
  Filled 2023-10-04: qty 2

## 2023-10-04 MED ORDER — MIDAZOLAM HCL 2 MG/2ML IJ SOLN
INTRAMUSCULAR | Status: AC
Start: 2023-10-04 — End: 2023-10-04
  Filled 2023-10-04: qty 2

## 2023-10-04 MED ORDER — LIDOCAINE 2% (20 MG/ML) 5 ML SYRINGE
INTRAMUSCULAR | Status: AC
  Filled 2023-10-04: qty 5

## 2023-10-04 MED ORDER — ACETAMINOPHEN 500 MG PO TABS
ORAL_TABLET | ORAL | Status: AC
  Filled 2023-10-04: qty 2

## 2023-10-04 MED ORDER — ONDANSETRON HCL 4 MG/2ML IJ SOLN
INTRAMUSCULAR | Status: DC | PRN
  Administered 2023-10-04: 4 mg via INTRAVENOUS

## 2023-10-04 MED ORDER — CHLORHEXIDINE GLUCONATE 4 % EX SOLN: 60.0000 mL | Freq: Once | CUTANEOUS | Status: DC

## 2023-10-04 MED ORDER — DEXAMETHASONE SODIUM PHOSPHATE 10 MG/ML IJ SOLN
INTRAMUSCULAR | Status: DC | PRN
  Administered 2023-10-04: 5 mg via INTRAVENOUS

## 2023-10-04 MED ORDER — FENTANYL CITRATE (PF) 100 MCG/2ML IJ SOLN
100.0000 ug | Freq: Once | INTRAMUSCULAR | Status: AC
  Administered 2023-10-04: 50 ug via INTRAVENOUS

## 2023-10-04 MED ORDER — VANCOMYCIN HCL 500 MG IV SOLR
INTRAVENOUS | Status: DC | PRN
  Administered 2023-10-04: 500 mg via TOPICAL

## 2023-10-04 MED ORDER — PROPOFOL 10 MG/ML IV BOLUS
INTRAVENOUS | Status: DC | PRN
  Administered 2023-10-04: 40 mg via INTRAVENOUS
  Administered 2023-10-04: 200 mg via INTRAVENOUS

## 2023-10-04 SURGICAL SUPPLY — 59 items
BANDAGE ESMARK 6X9 LF (GAUZE/BANDAGES/DRESSINGS) IMPLANT
BIT DRILL 2.6X VARIAX 2 (BIT) IMPLANT
BIT DRILL 60X2.5XDISP (BIT) IMPLANT
BLADE AVERAGE 25X9 (BLADE) IMPLANT
BLADE PRESCISION 7.0X.51X18.5 (BLADE) IMPLANT
BLADE SURG 15 STRL LF DISP TIS (BLADE) ×2 IMPLANT
BNDG COHESIVE 4X5 TAN STRL LF (GAUZE/BANDAGES/DRESSINGS) ×1 IMPLANT
BNDG ELASTIC 4INX 5YD STR LF (GAUZE/BANDAGES/DRESSINGS) ×1 IMPLANT
BNDG GAUZE DERMACEA FLUFF 4 (GAUZE/BANDAGES/DRESSINGS) ×1 IMPLANT
CANISTER SUCT 1200ML W/VALVE (MISCELLANEOUS) ×1 IMPLANT
CHLORAPREP W/TINT 26 (MISCELLANEOUS) ×1 IMPLANT
COVER BACK TABLE 60X90IN (DRAPES) ×1 IMPLANT
CUFF TRNQT CYL 34X4.125X (TOURNIQUET CUFF) ×1 IMPLANT
DRAPE C-ARM 42X72 X-RAY (DRAPES) ×1 IMPLANT
DRAPE C-ARMOR (DRAPES) ×1 IMPLANT
DRAPE EXTREMITY T 121X128X90 (DISPOSABLE) ×1 IMPLANT
DRAPE IMP U-DRAPE 54X76 (DRAPES) ×1 IMPLANT
DRAPE U-SHAPE 47X51 STRL (DRAPES) ×1 IMPLANT
DRSG MEPITEL 4X7.2 (GAUZE/BANDAGES/DRESSINGS) ×1 IMPLANT
ELECTRODE REM PT RTRN 9FT ADLT (ELECTROSURGICAL) ×1 IMPLANT
GAUZE PAD ABD 8X10 STRL (GAUZE/BANDAGES/DRESSINGS) ×3 IMPLANT
GAUZE SPONGE 4X4 12PLY STRL (GAUZE/BANDAGES/DRESSINGS) ×1 IMPLANT
GLOVE BIOGEL PI IND STRL 8 (GLOVE) ×1 IMPLANT
GLOVE SURG SS PI 7.5 STRL IVOR (GLOVE) ×1 IMPLANT
GOWN STRL REUS W/ TWL LRG LVL3 (GOWN DISPOSABLE) ×2 IMPLANT
NDL HYPO 22X1.5 SAFETY MO (MISCELLANEOUS) IMPLANT
NEEDLE HYPO 22X1.5 SAFETY MO (MISCELLANEOUS) IMPLANT
NS IRRIG 1000ML POUR BTL (IV SOLUTION) ×1 IMPLANT
Ortholoc 3DI plate screw ×1 IMPLANT
PACK BASIN DAY SURGERY FS (CUSTOM PROCEDURE TRAY) ×1 IMPLANT
PAD CAST 4YDX4 CTTN HI CHSV (CAST SUPPLIES) ×1 IMPLANT
PADDING CAST ABS COTTON 4X4 ST (CAST SUPPLIES) IMPLANT
PADDING CAST SYNTHETIC 4X4 STR (CAST SUPPLIES) IMPLANT
PADDING CAST SYNTHETIC 6X4 NS (CAST SUPPLIES) IMPLANT
PENCIL SMOKE EVACUATOR (MISCELLANEOUS) ×1 IMPLANT
PIN TEMP (PIN) IMPLANT
PLATE MTP LT 0D SM (Plate) IMPLANT
SCREW 3.5X 16 NON LOCK (Screw) IMPLANT
SCREW CANN DARTFIRE 3.5X44 (Screw) IMPLANT
SCREW ORTHOLOC 3.5X12 (Screw) IMPLANT
SCREW ORTHOLOC 3DI 3.5X10 (Screw) IMPLANT
SHEET MEDIUM DRAPE 40X70 STRL (DRAPES) ×1 IMPLANT
SLEEVE SCD COMPRESS KNEE MED (STOCKING) ×1 IMPLANT
SPIKE FLUID TRANSFER (MISCELLANEOUS) IMPLANT
SPLINT FIBERGLASS 4X30 (CAST SUPPLIES) ×2 IMPLANT
SPONGE T-LAP 18X18 ~~LOC~~+RFID (SPONGE) ×1 IMPLANT
STOCKINETTE 6 STRL (DRAPES) IMPLANT
SUCTION TUBE FRAZIER 10FR DISP (SUCTIONS) IMPLANT
SUT ETHILON 2 0 FS 18 (SUTURE) ×1 IMPLANT
SUT MNCRL AB 3-0 PS2 18 (SUTURE) IMPLANT
SUT VIC AB 0 CT1 27XBRD ANBCTR (SUTURE) ×1 IMPLANT
SUT VIC AB 2-0 CT1 TAPERPNT 27 (SUTURE) ×1 IMPLANT
SUTURE FIBERWR #2 38 T-5 BLUE (SUTURE) IMPLANT
SYR BULB EAR ULCER 3OZ GRN STR (SYRINGE) ×1 IMPLANT
SYR CONTROL 10ML LL (SYRINGE) IMPLANT
SYSTEM INST PACK DARTFIRE EDGE (MISCELLANEOUS) IMPLANT
TOWEL GREEN STERILE FF (TOWEL DISPOSABLE) ×2 IMPLANT
UNDERPAD 30X36 HEAVY ABSORB (UNDERPADS AND DIAPERS) ×1 IMPLANT
dartfire edge cannulated screw ×1 IMPLANT

## 2023-10-04 NOTE — H&P (Signed)
 H&P Update:  -History and Physical Reviewed  -Patient has been re-examined  -No change in the plan of care. She understands that the midfoot exostectomy may not necessarily relieve all symptoms as she has advanced osteoarthritis in the midfoot - however, she feels pain mainly at the dorsal spur so we will remove this in addition to the planned 1st MTP fusion today.   -The risks and benefits were presented and reviewed. The risks due to hardware/suture failure and/or irritation, new/persistent infection, stiffness, nerve/vessel/tendon injury or rerupture of repaired tendon, nonunion/malunion, allograft usage, wound healing issues, development of arthritis, failure of this surgery, possibility of external fixation with delayed definitive surgery, need for further surgery, thromboembolic events, anesthesia/medical complications, amputation, death among others were discussed. The patient acknowledged the explanation, agreed to proceed with the plan and a consent was signed.  Rita Gregory

## 2023-10-04 NOTE — Anesthesia Procedure Notes (Signed)
 Procedure Name: LMA Insertion Date/Time: 10/04/2023 12:31 PM  Performed by: Maudine Grayce ORN, CRNAPre-anesthesia Checklist: Patient identified, Emergency Drugs available, Suction available and Patient being monitored Patient Re-evaluated:Patient Re-evaluated prior to induction Oxygen Delivery Method: Circle System Utilized Preoxygenation: Pre-oxygenation with 100% oxygen Induction Type: IV induction Ventilation: Mask ventilation without difficulty LMA: LMA inserted LMA Size: 4.0 Number of attempts: 1 Airway Equipment and Method: Bite block Placement Confirmation: positive ETCO2 Tube secured with: Tape Dental Injury: Teeth and Oropharynx as per pre-operative assessment

## 2023-10-04 NOTE — Anesthesia Postprocedure Evaluation (Signed)
 Anesthesia Post Note  Patient: Rita Gregory  Procedure(s) Performed: FUSION, JOINT, GREAT TOE (Left: Toe) EXCISION, METATARSAL BONE, HEAD (Left: Toe)     Patient location during evaluation: Phase II Anesthesia Type: General Level of consciousness: awake and alert, oriented and patient cooperative Pain management: pain level controlled Vital Signs Assessment: post-procedure vital signs reviewed and stable Respiratory status: spontaneous breathing, nonlabored ventilation and respiratory function stable Cardiovascular status: blood pressure returned to baseline and stable Postop Assessment: no apparent nausea or vomiting, able to ambulate and adequate PO intake Anesthetic complications: no  No notable events documented.  Last Vitals:  Vitals:   10/04/23 1454 10/04/23 1521  BP:  (!) 121/97  Pulse: 92 (!) 105  Resp: 19 18  Temp:  (!) 36.1 C  SpO2: 93% 93%    Last Pain:  Vitals:   10/04/23 1521  TempSrc: Temporal  PainSc:       LLE Sensation: Full sensation (10/04/23 1517)          Jaiyden Laur,E. Beckey Polkowski

## 2023-10-04 NOTE — Transfer of Care (Signed)
 Immediate Anesthesia Transfer of Care Note  Patient: LEO WEYANDT  Procedure(s) Performed: FUSION, JOINT, GREAT TOE (Left: Toe) EXCISION, METATARSAL BONE, HEAD (Left: Toe)  Patient Location: PACU  Anesthesia Type:General and Regional  Level of Consciousness: drowsy  Airway & Oxygen Therapy: Patient Spontanous Breathing and Patient connected to face mask oxygen  Post-op Assessment: Report given to RN and Post -op Vital signs reviewed and stable  Post vital signs: Reviewed and stable  Last Vitals:  Vitals Value Taken Time  BP 115/70 10/04/23 14:07  Temp    Pulse 98 10/04/23 14:09  Resp 8 10/04/23 14:09  SpO2 97 % 10/04/23 14:09  Vitals shown include unfiled device data.  Last Pain:  Vitals:   10/04/23 0906  TempSrc: Tympanic  PainSc: 3       Patients Stated Pain Goal: 7 (10/04/23 0906)  Complications: No notable events documented.

## 2023-10-04 NOTE — Progress Notes (Signed)
 Assisted Dr. Kelly Mace with left, adductor canal, popliteal, ultrasound guided block. Side rails up, monitors on throughout procedure. See vital signs in flow sheet. Tolerated Procedure well.

## 2023-10-04 NOTE — Anesthesia Procedure Notes (Signed)
 Anesthesia Regional Block: Popliteal block   Pre-Anesthetic Checklist: , timeout performed,  Correct Patient, Correct Site, Correct Laterality,  Correct Procedure, Correct Position, site marked,  Risks and benefits discussed,  Surgical consent,  Pre-op evaluation,  At surgeon's request and post-op pain management  Laterality: Left and Lower  Prep: chloraprep       Needles:  Injection technique: Single-shot  Needle Type: Echogenic Needle     Needle Length: 9cm  Needle Gauge: 21     Additional Needles:   Procedures:,,,, ultrasound used (permanent image in chart),,    Narrative:  Start time: 10/04/2023 11:16 AM End time: 10/04/2023 11:20 AM Injection made incrementally with aspirations every 5 mL.  Performed by: Personally  Anesthesiologist: Leonce Athens, MD  Additional Notes: Pt identified in Holding room.  Monitors applied. Working IV access confirmed. Timeout, Sterile prep L distal lateral thigh.  #21ga ECHOgenic Arrow block needle to sciatic nerve at split in popliteal fossa with US  guidance.  20cc 0.5% Bupivacaine  1:200k epi injected incrementally after negative test dose.  Patient asymptomatic, VSS, no heme aspirated, tolerated well.   JAYSON Leonce, MD

## 2023-10-04 NOTE — Anesthesia Preprocedure Evaluation (Addendum)
 Anesthesia Evaluation  Patient identified by MRN, date of birth, ID band Patient awake    Reviewed: Allergy & Precautions, NPO status , Patient's Chart, lab work & pertinent test results  History of Anesthesia Complications Negative for: history of anesthetic complications  Airway Mallampati: II  TM Distance: >3 FB Neck ROM: Full    Dental  (+) Dental Advisory Given   Pulmonary asthma , COPD,  COPD inhaler   breath sounds clear to auscultation       Cardiovascular (-) angina  Rhythm:Regular Rate:Normal  '07 ECHO: PFO   Neuro/Psych  Headaches  Anxiety Depression       GI/Hepatic Neg liver ROS,GERD  Medicated and Controlled,,  Endo/Other  Hypothyroidism  Semaglutide : last 09/11/2023 BMI 35  Renal/GU negative Renal ROS     Musculoskeletal  (+) Arthritis ,  Fibromyalgia -  Abdominal   Peds  Hematology Hb 13.8, plt 269k   Anesthesia Other Findings   Reproductive/Obstetrics                             Anesthesia Physical Anesthesia Plan  ASA: 3  Anesthesia Plan: General   Post-op Pain Management: Regional block* and Tylenol  PO (pre-op)*   Induction: Intravenous  PONV Risk Score and Plan: 3 and Ondansetron , Dexamethasone  and Treatment may vary due to age or medical condition  Airway Management Planned: LMA  Additional Equipment: None  Intra-op Plan:   Post-operative Plan:   Informed Consent: I have reviewed the patients History and Physical, chart, labs and discussed the procedure including the risks, benefits and alternatives for the proposed anesthesia with the patient or authorized representative who has indicated his/her understanding and acceptance.     Dental advisory given  Plan Discussed with: CRNA and Surgeon  Anesthesia Plan Comments: (Plan routine monitors, GA with popliteal and adductor canal blocks for post op analgesia)       Anesthesia Quick Evaluation

## 2023-10-04 NOTE — Anesthesia Procedure Notes (Signed)
 Anesthesia Regional Block: Adductor canal block   Pre-Anesthetic Checklist: , timeout performed,  Correct Patient, Correct Site, Correct Laterality,  Correct Procedure, Correct Position, site marked,  Risks and benefits discussed,  Surgical consent,  Pre-op evaluation,  At surgeon's request and post-op pain management  Laterality: Left and Lower  Prep: chloraprep       Needles:  Injection technique: Single-shot  Needle Type: Echogenic Needle     Needle Length: 9cm  Needle Gauge: 21     Additional Needles:   Procedures:,,,, ultrasound used (permanent image in chart),,    Narrative:  Start time: 10/04/2023 11:09 AM End time: 10/04/2023 11:15 AM Injection made incrementally with aspirations every 5 mL.  Performed by: Personally  Anesthesiologist: Leonce Athens, MD  Additional Notes: Pt identified in Holding room.  Monitors applied. Working IV access confirmed. Timeout, Sterile prep L thigh.  #21ga ECHOgenic Arrow block needle into adductor canal with US  guidance.  15cc 0.5% Bupivacaine  1:200k epi injected incrementally after negative test dose.  Patient asymptomatic, VSS, no heme aspirated, tolerated well.   JAYSON Leonce, MD

## 2023-10-05 NOTE — Op Note (Signed)
 10/04/2023  12:16 AM   PATIENT: Rita Gregory  78 y.o. female  MRN: [id number]   PRE-OPERATIVE DIAGNOSIS:   Acquired left hallux rigidus and midfoot osteoarthritis   POST-OPERATIVE DIAGNOSIS:   Same   PROCEDURE: 1] Left 1st MTP joint arthrodesis 2] Left midfoot dorsal exostectomy   SURGEON:  Lillia Mountain, MD   ASSISTANT: None   ANESTHESIA: General, regional   EBL: Minimal   TOURNIQUET:    Total Tourniquet Time Documented: Thigh (Left) - 78 minutes Total: Thigh (Left) - 78 minutes    COMPLICATIONS: None apparent   DISPOSITION: Extubated, awake and stable to recovery.   INDICATION FOR PROCEDURE: The patient presented with above diagnosis.  We discussed the diagnosis, alternative treatment options, risks and benefits of the above surgical intervention, as well as alternative non-operative treatments. All questions/concerns were addressed and the patient/family demonstrated appropriate understanding of the diagnosis, the procedure, the postoperative course, and overall prognosis. The patient wished to proceed with surgical intervention and signed an informed surgical consent as such, in each others presence prior to surgery.   PROCEDURE IN DETAIL: After preoperative consent was obtained and the correct operative site was identified, the patient was brought to the operating room supine on stretcher and transferred onto operating table. General anesthesia was induced. Preoperative antibiotics were administered. Surgical timeout was taken. The patient was then positioned supine with an ipsilateral hip bump. The operative lower extremity was prepped and draped in standard sterile fashion with a tourniquet around the thigh. The extremity was exsanguinated and the tourniquet was inflated to 275 mmHg.  A dorsal approach was made directly over the first metatarsophalangeal joint. This was carried down to the level of the extensor hallucis longus tendon. The  capsule was incised medial to the tendon, and the tendon was protected throughout the procedure. The capsule and surrounding ligaments were released in order to allow full exposure of the first MTP joint. The cartilage remaining on either articular surface of the joint was debrided using curette and rongeur. A guide wire was placed directly into the first metatarsal and appropriate sized reamer was used to prepare the surface. This guidewire was then used in the proximal phalanx, and that articular surface was reamed similarly. Both joint surfaces were perforated with a K wire in order to stimulate bone healing across the arthrodesis site. The medial eminence was resected with a saw blade.    A crossing guide wire was placed to secure the arthrodesis in appropriate position. This was verified clinically using foot plate as well as with fluoroscopy. A Stryker DartFire compression screw was implanted over this guidewire using cannulated drill technique achieving excellent compression across the arthrodesis site. We then selected an appropriate sized Stryker OrthoLoc plate and implanted this with a series of non-locking and locking screws. Footplate and fluoroscopy were used throughout to verify appropriate position of the fusion.   A separate incision was made over the dorsal midfoot spurring. Dissection carefully carried down to the level of bone taking care to protect neurovascular structures. The area was saucerized with resection of dorsal spurs over 2nd and 3rd TMT joints.   The surgical sites were thoroughly irrigated. The tourniquet was deflated and hemostasis achieved. Betadine  and vancomycin powder were applied. The deep layers were closed using 2-0 vicryl. The skin was closed without tension.    The leg was cleaned with saline and sterile dressings with gauze were applied. A well padded bulky postop shoe was applied. The patient was awakened from anesthesia and  transported to the recovery room in  stable condition.    FOLLOW UP PLAN: -transfer to PACU, then home -strict heel WB operative extremity in postop shoe (only after nerve block has completely worn off), maximum elevation -maintain dressings until follow up -DVT ppx: Aspirin 81 mg twice daily -follow up as outpatient within 7-10 days for wound check -sutures out in 2-3 weeks in outpatient office   RADIOGRAPHS: AP, lateral, oblique and stress radiographs of the left foot were obtained intraoperatively. These showed interval 1st MTP fusion and midfoot exostectomy. Manual stress radiographs were taken and the joints were noted to be stable following fusion. All hardware is appropriately positioned and of the appropriate lengths. No other acute injuries are noted.   Lillia Mountain [facility]

## 2023-10-06 ENCOUNTER — Encounter (HOSPITAL_BASED_OUTPATIENT_CLINIC_OR_DEPARTMENT_OTHER): Payer: Self-pay | Admitting: Orthopaedic Surgery

## 2023-10-09 ENCOUNTER — Encounter (HOSPITAL_BASED_OUTPATIENT_CLINIC_OR_DEPARTMENT_OTHER): Payer: Self-pay | Admitting: Orthopaedic Surgery

## 2023-11-15 ENCOUNTER — Other Ambulatory Visit: Payer: Self-pay | Admitting: Family

## 2023-11-15 ENCOUNTER — Other Ambulatory Visit: Payer: Self-pay | Admitting: Internal Medicine

## 2023-11-21 DIAGNOSIS — M25672 Stiffness of left ankle, not elsewhere classified: Secondary | ICD-10-CM | POA: Diagnosis not present

## 2023-11-21 DIAGNOSIS — R29898 Other symptoms and signs involving the musculoskeletal system: Secondary | ICD-10-CM | POA: Diagnosis not present

## 2023-11-22 ENCOUNTER — Other Ambulatory Visit: Payer: Self-pay | Admitting: Internal Medicine

## 2023-11-22 NOTE — Telephone Encounter (Signed)
 Copied from CRM [id number]. Topic: Clinical - Medication Refill >> Nov 22, 2023 10:53 AM Jayma L wrote: Medication: pravastatin (PRAVACHOL) 40 MG tablet pantoprazole (PROTONIX) 40 MG tablet  Has the patient contacted their pharmacy? Yes (Agent: If no, request that the patient contact the pharmacy for the refill. If patient does not wish to contact the pharmacy document the reason why and proceed with request.) (Agent: If yes, when and what did the pharmacy advise?)  This is the patient's preferred pharmacy:  [id number] - [place], [place] - [address] [place] [address] Phone: [phone] Fax: [phone]  Is this the correct pharmacy for this prescription? Yes If no, delete pharmacy and type the correct one.   Has the prescription been filled recently? No  Is the patient out of the medication? No  Has the patient been seen for an appointment in the last year OR does the patient have an upcoming appointment? Yes  Can we respond through MyChart? Yes   Agent: Please be advised that Rx refills may take up to 3 business days. We ask that you follow-up with your pharmacy.

## 2023-11-23 MED ORDER — PRAVASTATIN SODIUM 40 MG PO TABS: 40.0000 mg | ORAL_TABLET | Freq: Every day | ORAL | 2 refills | Status: AC

## 2023-11-23 MED ORDER — PANTOPRAZOLE SODIUM 40 MG PO TBEC: 40.0000 mg | DELAYED_RELEASE_TABLET | Freq: Every day | ORAL | 3 refills | Status: AC

## 2023-11-24 DIAGNOSIS — M2012 Hallux valgus (acquired), left foot: Secondary | ICD-10-CM | POA: Diagnosis not present

## 2023-11-24 DIAGNOSIS — M2022 Hallux rigidus, left foot: Secondary | ICD-10-CM | POA: Diagnosis not present

## 2023-11-27 DIAGNOSIS — G4486 Cervicogenic headache: Secondary | ICD-10-CM | POA: Diagnosis not present

## 2023-12-06 DIAGNOSIS — M25672 Stiffness of left ankle, not elsewhere classified: Secondary | ICD-10-CM | POA: Diagnosis not present

## 2023-12-06 DIAGNOSIS — R29898 Other symptoms and signs involving the musculoskeletal system: Secondary | ICD-10-CM | POA: Diagnosis not present

## 2023-12-13 DIAGNOSIS — M25672 Stiffness of left ankle, not elsewhere classified: Secondary | ICD-10-CM | POA: Diagnosis not present

## 2023-12-13 DIAGNOSIS — R29898 Other symptoms and signs involving the musculoskeletal system: Secondary | ICD-10-CM | POA: Diagnosis not present

## 2023-12-13 DIAGNOSIS — Z23 Encounter for immunization: Secondary | ICD-10-CM | POA: Diagnosis not present

## 2024-01-03 DIAGNOSIS — M25672 Stiffness of left ankle, not elsewhere classified: Secondary | ICD-10-CM | POA: Diagnosis not present

## 2024-01-03 DIAGNOSIS — R29898 Other symptoms and signs involving the musculoskeletal system: Secondary | ICD-10-CM | POA: Diagnosis not present

## 2024-01-05 DIAGNOSIS — H524 Presbyopia: Secondary | ICD-10-CM | POA: Diagnosis not present

## 2024-01-05 DIAGNOSIS — H2513 Age-related nuclear cataract, bilateral: Secondary | ICD-10-CM | POA: Diagnosis not present

## 2024-01-05 DIAGNOSIS — H04123 Dry eye syndrome of bilateral lacrimal glands: Secondary | ICD-10-CM | POA: Diagnosis not present

## 2024-01-05 DIAGNOSIS — E119 Type 2 diabetes mellitus without complications: Secondary | ICD-10-CM | POA: Diagnosis not present

## 2024-01-05 LAB — HM DIABETES EYE EXAM

## 2024-01-10 DIAGNOSIS — M25672 Stiffness of left ankle, not elsewhere classified: Secondary | ICD-10-CM | POA: Diagnosis not present

## 2024-01-10 DIAGNOSIS — R29898 Other symptoms and signs involving the musculoskeletal system: Secondary | ICD-10-CM | POA: Diagnosis not present

## 2024-01-12 DIAGNOSIS — M19072 Primary osteoarthritis, left ankle and foot: Secondary | ICD-10-CM | POA: Diagnosis not present

## 2024-01-12 DIAGNOSIS — M2022 Hallux rigidus, left foot: Secondary | ICD-10-CM | POA: Diagnosis not present

## 2024-01-12 DIAGNOSIS — M4714 Other spondylosis with myelopathy, thoracic region: Secondary | ICD-10-CM | POA: Diagnosis not present

## 2024-01-24 DIAGNOSIS — R29898 Other symptoms and signs involving the musculoskeletal system: Secondary | ICD-10-CM | POA: Diagnosis not present

## 2024-01-24 DIAGNOSIS — M25672 Stiffness of left ankle, not elsewhere classified: Secondary | ICD-10-CM | POA: Diagnosis not present

## 2024-01-26 DIAGNOSIS — H2513 Age-related nuclear cataract, bilateral: Secondary | ICD-10-CM | POA: Diagnosis not present

## 2024-01-26 DIAGNOSIS — H04123 Dry eye syndrome of bilateral lacrimal glands: Secondary | ICD-10-CM | POA: Diagnosis not present

## 2024-01-26 DIAGNOSIS — H524 Presbyopia: Secondary | ICD-10-CM | POA: Diagnosis not present

## 2024-01-31 DIAGNOSIS — R29898 Other symptoms and signs involving the musculoskeletal system: Secondary | ICD-10-CM | POA: Diagnosis not present

## 2024-01-31 DIAGNOSIS — M25672 Stiffness of left ankle, not elsewhere classified: Secondary | ICD-10-CM | POA: Diagnosis not present

## 2024-02-14 DIAGNOSIS — M25672 Stiffness of left ankle, not elsewhere classified: Secondary | ICD-10-CM | POA: Diagnosis not present

## 2024-02-14 DIAGNOSIS — R29898 Other symptoms and signs involving the musculoskeletal system: Secondary | ICD-10-CM | POA: Diagnosis not present

## 2024-02-26 ENCOUNTER — Other Ambulatory Visit: Payer: Self-pay | Admitting: Internal Medicine

## 2024-02-29 DIAGNOSIS — M5414 Radiculopathy, thoracic region: Secondary | ICD-10-CM | POA: Diagnosis not present

## 2024-02-29 DIAGNOSIS — M5115 Intervertebral disc disorders with radiculopathy, thoracolumbar region: Secondary | ICD-10-CM | POA: Diagnosis not present

## 2024-03-01 DIAGNOSIS — B379 Candidiasis, unspecified: Secondary | ICD-10-CM | POA: Diagnosis not present

## 2024-03-01 DIAGNOSIS — N951 Menopausal and female climacteric states: Secondary | ICD-10-CM | POA: Diagnosis not present

## 2024-03-01 DIAGNOSIS — N907 Vulvar cyst: Secondary | ICD-10-CM | POA: Diagnosis not present

## 2024-03-04 DIAGNOSIS — R29898 Other symptoms and signs involving the musculoskeletal system: Secondary | ICD-10-CM | POA: Diagnosis not present

## 2024-03-04 DIAGNOSIS — M25672 Stiffness of left ankle, not elsewhere classified: Secondary | ICD-10-CM | POA: Diagnosis not present

## 2024-03-19 ENCOUNTER — Other Ambulatory Visit: Payer: Self-pay | Admitting: Obstetrics and Gynecology

## 2024-03-19 DIAGNOSIS — Z1231 Encounter for screening mammogram for malignant neoplasm of breast: Secondary | ICD-10-CM

## 2024-03-20 ENCOUNTER — Other Ambulatory Visit: Payer: Self-pay

## 2024-03-20 DIAGNOSIS — M25672 Stiffness of left ankle, not elsewhere classified: Secondary | ICD-10-CM | POA: Diagnosis not present

## 2024-03-20 DIAGNOSIS — R29898 Other symptoms and signs involving the musculoskeletal system: Secondary | ICD-10-CM | POA: Diagnosis not present

## 2024-03-20 MED ORDER — DEXCOM G7 SENSOR MISC: 3 refills | Status: AC

## 2024-03-20 NOTE — Telephone Encounter (Signed)
 Pt called and left a vm stating that she wants to be changed to the Dexcomf.   Rx has been sent.   Requested Prescriptions   Signed Prescriptions Disp Refills   Continuous Glucose Sensor (DEXCOM G7 SENSOR) MISC 9 each 3    Sig: Use to check glucose continuously, change sensor every 10 days    Authorizing Provider: TRIXIE FILE    Ordering User: CLEOTILDE ROLIN RAMAN

## 2024-03-21 ENCOUNTER — Ambulatory Visit

## 2024-03-26 ENCOUNTER — Ambulatory Visit (INDEPENDENT_AMBULATORY_CARE_PROVIDER_SITE_OTHER): Admitting: Internal Medicine

## 2024-03-26 ENCOUNTER — Other Ambulatory Visit: Payer: Self-pay | Admitting: Internal Medicine

## 2024-03-26 ENCOUNTER — Encounter: Payer: Self-pay | Admitting: Internal Medicine

## 2024-03-26 VITALS — BP 120/80 | HR 78 | Ht 59.0 in | Wt 172.0 lb

## 2024-03-26 DIAGNOSIS — E063 Autoimmune thyroiditis: Secondary | ICD-10-CM

## 2024-03-26 DIAGNOSIS — E782 Mixed hyperlipidemia: Secondary | ICD-10-CM

## 2024-03-26 DIAGNOSIS — E559 Vitamin D deficiency, unspecified: Secondary | ICD-10-CM

## 2024-03-26 DIAGNOSIS — Z8639 Personal history of other endocrine, nutritional and metabolic disease: Secondary | ICD-10-CM

## 2024-03-26 DIAGNOSIS — R7303 Prediabetes: Secondary | ICD-10-CM

## 2024-03-26 DIAGNOSIS — R7989 Other specified abnormal findings of blood chemistry: Secondary | ICD-10-CM | POA: Diagnosis not present

## 2024-03-26 LAB — POCT GLYCOSYLATED HEMOGLOBIN (HGB A1C): Hemoglobin A1C: 6 % — AB (ref 4.0–5.6)

## 2024-03-26 MED ORDER — WEGOVY 0.25 MG/0.5ML ~~LOC~~ SOAJ: 0.2500 mg | SUBCUTANEOUS | 3 refills | Status: DC

## 2024-03-26 MED ORDER — FREESTYLE LIBRE 3 PLUS SENSOR MISC: 1.0000 | 3 refills | Status: AC

## 2024-03-26 NOTE — Patient Instructions (Signed)
 Please continue Levothyroxine  50 mcg 6/7 days and 100 mcg 1/7 days.  Take the thyroid  hormone every day, with water, in am, at least 30 minutes before breakfast, separated by at least 4 hours from: - acid reflux medications - calcium - iron  - multivitamins  Please continue Wegovy  0.25 mg weekly.  Please come back for a follow-up appointment in 6 months.

## 2024-03-26 NOTE — Progress Notes (Signed)
 Patient ID: Rita Gregory, female   DOB: 05-26-1945, 78 y.o.   MRN: [id number]  HPI: Rita Gregory is a 78 y.o. female, initially self-referred, returning for follow-up for prediabetes, since early 2000s, and suspected hypoglycemic spells, Hashimoto's hypothyroidism.  She is a former diabetes programmer, systems. Her husband, Dr. Ozell Gregory, is also my patient. She previously saw Dr. Mirna before he retired.  Last visit with me 6 months ago.  Interim history: No chest pain, shortness of breath. She has increased urination. Has a history of migraines, which have resolved after changing her pillow. She is on Wegovy  0.25 mg weekly - tolerating well. She lost 14 lbs before last visit and gained 2 pounds since then.  She feels that this is regulating her blood sugars well. He had foot surgery since last visit and this healed well.  She is finishing physical therapy.  Prediabetes:  Reviewed history: Patient describes that she has longstanding sensitivity to blood sugar variations even within the normal range.    She describes that after she had attached a freestyle libre CGM, she realized that her blood sugars did not actually drop as low as she thought when she was feeling hypoglycemic, however, she does describe significant hypoglycemic symptoms including slurring of words and disorientation when sugars go to 68-70.  When sugars get to around 80-90, she describes heartburn, vision changes, headaches, extreme fatigue, dizziness.  This happens mostly during the day, after meals.  She corrects them with 2 glucose tablets.  She also feels the needs to eat ice cream at night to raise her glucose around 115-120 when going to bed, to avoid lows overnight.  Before our visit in 12/2020 had to change her sensor targets from 90 to 85 and then to 80 as it was alarming her too much as she could not sleep.  In 07/2022, she had a period of lower blood sugars, in the 70s, but they improved.  She  prefers to keep her blood sugars higher than 100 at all times.  Reviewed patient's HbA1c levels: Lab Results  Component Value Date   HGBA1C 6.0 (H) 09/26/2023   HGBA1C 6.1 (A) 01/31/2023   HGBA1C 6.3 (H) 09/29/2022   HGBA1C 5.9 (A) 07/12/2022   HGBA1C 6.0 (A) 01/07/2022   HGBA1C 6.0 (A) 08/05/2021   HGBA1C 5.8 (A) 01/07/2021   HGBA1C 6.2 (H) 11/03/2020   HGBA1C 5.8 (H) 11/22/2016   Patient was started on 500 mg of metformin  ER by Dr. Mirna in 08/2020, but she stopped due to concern for hypoglycemia.  She is currently on Wegovy  0.25 mg weekly.  She is checking her blood sugars with a CGM:  Previously:   Prev.:  Lowest blood sugars: 65 >> ... 75. Hypoglycemia awareness at 70. Highest blood sugar: 209 >> 202.  Pt's meals are: - Brunch: Half a cup of great grain cereal with 10 pecans and 10 for almonds, + half-and-half - Dinner: 4 to 6 ounces of meat or fish -usually present - Snacks: 1-2 -including ice cream at night  - no CKD; last BUN/creatinine:  Lab Results  Component Value Date   BUN 22 09/26/2023   BUN 20 09/29/2022   CREATININE 0.76 09/26/2023   CREATININE 0.71 09/29/2022   Lab Results  Component Value Date   MICRALBCREAT 29 09/26/2023   MICRALBCREAT 28 09/29/2022   MICRALBCREAT 17 01/25/2022   MICRALBCREAT 15 11/03/2020  11/03/2020: ACR 15 She is not on ARB or ACE inhibitor.  -+ HL; last set of lipids: Lab  Results  Component Value Date   CHOL 193 09/29/2022   HDL 59 09/29/2022   LDLCALC 82 09/29/2022   TRIG 391 (H) 09/29/2022   CHOLHDL 3.3 09/29/2022  On pravastatin  40 mg daily and Vascepa  2 g twice a day -started before our visit from 12/2020, but now not covered by insurance due to absence of cardiovascular disease >> now pays out of pocket. Now 1 mg 2x a day 2/2 diarrhea. She may have been off the med in 09/2022.   - last eye exam was 01/05/2024. No DR. + dry macular degeneration, dry eyes.    - no numbness and tingling in her feet.  Latest  foot exam 10/03/2023.   Pt has FH of DM in father - DM2.   Hypothyroidism: In 12/2022, she believed she had an episode of thyroiditis: Neck pain, thyroid  fullness on palpation.  This resolved.  Pt is on levothyroxine  50 mcg 6/7 days and 100 mcg 1/7 days, increase due to hair loss: - at night - fasting - at least 30 min from b'fast - no calcium - no iron  - + multivitamins - usually at night but occasionally in am - + PPIs at night - not on Biotin  TFTs reviewed: Lab Results  Component Value Date   TSH 1.55 09/26/2023   TSH 0.95 01/31/2023   TSH 2.34 09/29/2022   TSH 1.25 07/12/2022   TSH 3.88 01/07/2022   TSH 1.60 08/05/2021   TSH 0.63 05/21/2021   TSH 1.13 02/18/2021   TSH 4.00 11/03/2020   TSH 2.17 11/22/2016   She also has a history of a tiny thyroid  nodule: 08/30/2007: Thyroid  ultrasound: Findings: Thyroid  gland is lower limits of normal in size with the  right lobe measuring 3.3 cm long X 1.2 cm AP X 1.1 cm wide.  Left  lobe measures 3.9 cm long X 1.0 cm AP X 1.3 cm wide.  Isthmus  measures 2 mm AP.  Thyroid  echotexture is homogeneous.  At the  medial mid left lobe the is cystic focus measuring 5 mm long X 3 mm  AP X 3 mm wide containing tiny anterior nonshadowing mural  echogenicity.     IMPRESSION:  1. Solitary 5 mm slightly complex cyst at the medial mid left lobe  thyroid  favoring cystic adenoma.  2.  Otherwise, negative.    Pt denies: - feeling nodules in neck - hoarseness - dysphagia - choking She has irritation in her throat-wonders if this is related to an irritant in her home.  + H/o vit D def. Reviewed latest vitamin D  levels: Lab Results  Component Value Date   VD25OH 64.19 01/31/2023   VD25OH 77.11 07/12/2022   VD25OH >120 01/07/2022   VD25OH 98.09 08/05/2021   VD25OH 101 (H) 11/03/2020   VD25OH 69 11/22/2016  She was previously on ergocalciferol  50,000 units weekly.  I previously advised her to take vitamin D3 1000 units daily but she had  ergocalciferol  at home and switched to 50,000 units every 2 weeks.  Afterwards, we stopped the vitamin D  supplements - only takes MVI.  A B12 level was normal: Lab Results  Component Value Date   VITAMINB12 911 11/03/2020   She also has a history of: ADHD-on Adderall Chronic pain syndrome Chronic fatigue Fibromyalgia-sees rheumatology -on methylphenidate  patch for this and for ADD GERD HTN Back pain and shoulder pain-status post spinal surgery - Dr. Colon Osteoarthritis Migraines Dry eyes-on Xiidra  and Systain - possibly Sjogren increased urination (has stress incontinence - stable) She was  referred to [facility] since last visit (Dr. Katrinka) for vertigo. She also had epigastric hernia surgery in 01/2022.  ROS: + see HPI  Past Medical History:  Diagnosis Date   ADD (attention deficit disorder)    Arthritis of left acromioclavicular joint    Asthma due to seasonal allergies    Cervical disc disorder with left arm radiculopathy of cervical region 04/20/2017   frequent headaches   Chronic pain    Dr. Colon follows   Chronically dry eyes, bilateral    Complication of anesthesia    01-24-2022  pt stated and her husband (dr hershal) pt is  very sensitive to fentyl  and other anesthesia drugs,  states she needs to be breathing, fully awake ,  prior to being discharged   Congenital defect of septal closure    previously followed by dr h. claudene  (last visit approx 2011 )  per echo in care everywhere 10-18-2005 possible pfo,  cardiac MRI/ MRA 11-11-2005 no evidence for significant left-to-right or right-to-left shunting at rest and does not demonstrate atrial septal defect, normal left and right atrial size w/ interatrial septum is redundant & aneurymal   Diverticulosis of colon (without mention of hemorrhage) 11/09/2007   Environmental and seasonal allergies    Epigastric hernia    Fibromyalgia    GAD (generalized anxiety disorder)    GERD (gastroesophageal reflux disease)     Hashimoto's disease    endocrinologist--- dr vianne   History of gastric ulcer    1990s   History of hypertension    Hyperlipidemia    Idiopathic postprandial hypoglycemia    followed by dr vianne   Kyphoscoliosis    40 degree bend ro right side   MDD (major depressive disorder)    Paroxysmal spells 02/13/2013   suspect hypoglycemic   Pre-diabetes    endocrinologist--- dr vianne;    (01-24-2022  pt uses Libre 2,  stated fasting blood sugar average 100s -- 200s)   Rash    01-24-2022  pt reports currenly has rash under breast , this happens intermittantly   Vestibular migraine    neurologist--- dr d. patel   Wears glasses    Past Surgical History:  Procedure Laterality Date   ANTERIOR LATERAL LUMBAR FUSION 4 LEVELS Left 06/16/2015   Procedure: ANTERIOR LATERAL LUMBAR FUSION THORACIC TWELVE-LUMBAR FOUR;  Surgeon: Victory Colon, MD;  Location: [facility];  Service: Neurosurgery;  Laterality: Left;  Thoracolumbar spine   ARTHRODESIS METATARSALPHALANGEAL JOINT (MTPJ) Left 10/04/2023   Procedure: FUSION, JOINT, GREAT TOE;  Surgeon: Barton Drape, MD;  Location: [facility];  Service: Orthopedics;  Laterality: Left;   CESAREAN SECTION     1972  and 1974   CHOLECYSTECTOMY, LAPAROSCOPIC  1992   COLONOSCOPY WITH ESOPHAGOGASTRODUODENOSCOPY (EGD)  03/23/2020   dr abran COPES HERNIA REPAIR N/A 01/27/2022   Procedure: OPEN EPIGASTRIC HERNIA REPAIR WITH MESH PATCH;  Surgeon: Eletha Boas, MD;  Location: [facility];  Service: General;  Laterality: N/A;   ESOPHAGOGASTRODUODENOSCOPY  02/23/2012   Procedure: ESOPHAGOGASTRODUODENOSCOPY (EGD);  Surgeon: Norleen LOISE Abran, MD;  Location: [facility];  Service: Endoscopy;  Laterality: N/A;   KNEE ARTHROSCOPY W/ ACL RECONSTRUCTION Right 2003   approx   KNEE ARTHROSCOPY W/ MENISCAL REPAIR Right 04/17/2006   @[facility]  by dr jane LABRADOR HEAD EXCISION Left 10/04/2023   Procedure: EXCISION, METATARSAL BONE,  HEAD;  Surgeon: Barton Drape, MD;  Location: [facility];  Service: Orthopedics;  Laterality: Left;  dorsal midfoot exostectomy   POSTERIOR LUMBAR FUSION 4 LEVEL N/A 06/16/2015   Procedure: POSTERIOR LUMBAR FUSION LUMBAR FOUR-FIVE LUMBAR FIVE-SACRAL ONE;  Surgeon: Victory Gens, MD;  Location: [facility];  Service: Neurosurgery;  Laterality: N/A;   RADIOLOGY WITH ANESTHESIA N/A 06/15/2015   Procedure: RADIOLOGY WITH ANESTHESIA;  Surgeon: Medication Radiologist, MD;  Location: [facility];  Service: Radiology;  Laterality: N/A;   SHOULDER ARTHROSCOPY Left 04/28/2017   Procedure: ARTHROSCOPY SHOULDER WITH EXTENSIVE DEBRIDEMENT ROTATOR CUFF TEAR;  Surgeon: Jane Charleston, MD;  Location: [facility];  Service: Orthopedics;  Laterality: Left;   SHOULDER ARTHROSCOPY WITH ROTATOR CUFF REPAIR Right 06/05/2009   @[facility]  by dr jane   TOTAL ABDOMINAL HYSTERECTOMY W/ BILATERAL SALPINGOOPHORECTOMY Bilateral 1988   per pt and Appendectomy   TOTAL KNEE ARTHROPLASTY Right 12/22/2008   @[facility] by dr wallis;    intraoperatively repair popliteal artery by dr fields   Social History   Socioeconomic History   Marital status: Married    Spouse name: Rita   Number of children: Not on file   Years of education: Not on file   Highest education level: Not on file  Occupational History   Occupation: Engineering Geologist:   Tobacco Use   Smoking status: Never    Passive exposure: Yes   Smokeless tobacco: Never   Tobacco comments:    Flight attentent for 7 years. [id number] Passive smoker  Vaping Use   Vaping status: Never Used  Substance and Sexual Activity   Alcohol use: Not Currently    Comment: Very rarely    Drug use: Never   Sexual activity: Yes    Partners: Male    Birth control/protection: Surgical  Other Topics Concern   Not on file  Social History Narrative   Husband is Dr. Ozell Gregory       Right Handed    Lives in a three story home    Social Drivers  of Health   Tobacco Use: Medium Risk (10/04/2023)   Patient History    Smoking Tobacco Use: Never    Smokeless Tobacco Use: Never    Passive Exposure: Yes  Financial Resource Strain: Not on file  Food Insecurity: Low Risk (12/09/2022)   Received from [facility]   Epic    Within the past 12 months, you worried that your food would run out before you got money to buy more: Never true    Within the past 12 months, the food you bought just didn't last and you didn't have money to get more. : Never true  Transportation Needs: No Transportation Needs (12/09/2022)   Received from [facility]    In the past 12 months, has lack of reliable transportation kept you from medical appointments, meetings, work or from getting things needed for daily living? : No  Physical Activity: Not on file  Stress: Not on file  Social Connections: Not on file  Intimate Partner Violence: Not on file  Depression (PHQ2-9): Low Risk (01/24/2022)   Depression (PHQ2-9)    PHQ-2 Score: 0  Alcohol Screen: Not on file  Housing: Low Risk (12/09/2022)   Received from [facility]   Epic    What is your living situation today?: I have a steady place to live    Think about the place you live. Do you have problems with any of the following? Choose all that apply:: None/None on this list  Utilities: Low Risk (12/09/2022)   Received  from [facility]   Utilities    In the past 12 months has the electric, gas, oil, or water company threatened to shut off services in your home? : No  Health Literacy: Not on file   Current Outpatient Medications on File Prior to Visit  Medication Sig Dispense Refill   acetaminophen  (TYLENOL ) 650 MG CR tablet Take 650 mg by mouth every 8 (eight) hours as needed for pain.     albuterol  (VENTOLIN  HFA) 108 (90 Base) MCG/ACT inhaler Inhale 2 puffs into the lungs every 4 (four) hours as needed for wheezing 18 g 5   ALPRAZolam  (XANAX ) 0.5 MG tablet One half to one tablet twice  daily as needed for vertigo 60 tablet 0   conjugated estrogens  (PREMARIN ) vaginal cream Place 0.625 mg vaginally daily as needed.      Continuous Glucose Sensor (DEXCOM G7 SENSOR) MISC Use to check glucose continuously, change sensor every 10 days 9 each 3   Cyanocobalamin (B-12 PO) Take by mouth at bedtime.     EPINEPHRINE  0.3 mg/0.3 mL IJ SOAJ injection USE AS DIRECTED FOR ALLERGIC REACTION 2 each 6   escitalopram  (LEXAPRO ) 10 MG tablet Take 10-15 mg by mouth 2 (two) times daily.     estrogens , conjugated, (PREMARIN ) 0.45 MG tablet Take 1 tablet (0.45 mg total) by mouth daily. (Patient taking differently: Take 0.45 mg by mouth at bedtime.) 90 tablet 3   glucose blood (FREESTYLE LITE) test strip Use as instructed 2x a day 100 each 12   hydrocortisone 2.5 % cream as needed.     icosapent  Ethyl (VASCEPA ) 1 g capsule TAKE 2 CAPSULES (2 G TOTAL) BY MOUTH 2 (TWO) TIMES DAILY WITH A MEAL.-PA DENIED 120 capsule 14   ketoconazole  (NIZORAL ) 2 % cream APPLY 1 APPLICATION TOPICALLY 2 TIMES DAILY AS NEEDED FOR IRRITATION. 15 g 3   levothyroxine  (SYNTHROID ) 50 MCG tablet TAKE BY MOUTH 1 TABLET 6 OUT OF 7 DAYS AND 2 TABLETS 1 OUT OF 7 DAYS BEFORE BREAKFAST 105 tablet 3   Lifitegrast  (XIIDRA ) 5 % SOLN Place 1 drop into both eyes 2 (two) times daily. (Patient taking differently: Place 1 drop into both eyes 2 (two) times daily.) 900 each 4   loratadine (CLARITIN) 10 MG tablet Take 10 mg by mouth daily as needed for allergies.     meclizine (ANTIVERT) 25 MG tablet Take 50-75 mg by mouth 3 (three) times daily as needed for dizziness.     methylphenidate  (DAYTRANA ) 10 mg/9hr patch Place 1 patch (10 mg total) onto the skin daily in the afternoon as needed. 01/06/22 30 patch 0   methylphenidate  (DAYTRANA ) 15 mg/9hr Place 1 patch (15 mg total) onto the skin daily. 01/06/22 30 patch 0   miconazole (MONISTAT 7) 2 % vaginal cream Use as needed     Multiple Vitamins-Minerals (CENTRUM SILVER  PO) Take 1 tablet by mouth at  bedtime. CENTRUM SILVER  GUMMY ONE A DAY     oxymetazoline (AFRIN) 0.05 % nasal spray Place into both nostrils 2 (two) times daily as needed.     pantoprazole  (PROTONIX ) 40 MG tablet Take 1 tablet (40 mg total) by mouth at bedtime. 90 tablet 3   Polyvinyl Alcohol-Povidone (REFRESH OP) Apply to eye as needed.     pravastatin  (PRAVACHOL ) 40 MG tablet Take 1 tablet (40 mg total) by mouth at bedtime. 90 tablet 2   Pyridoxine  HCl (B-6 PO) Take 1 capsule by mouth at bedtime.     Semaglutide -Weight Management (WEGOVY ) 0.25 MG/0.5ML SOAJ  Inject 0.25 mg into the skin once a week. 6 mL 3   silver  sulfADIAZINE  (SILVADENE ) 1 % cream APPLY TO THE AFFECTED AREA(S) AS NEEDED FOR BURN AS DIRECTED 25 g 2   traZODone  (DESYREL ) 50 MG tablet Take 1-2 tablets (50-100 mg total) by mouth at bedtime as needed for sleep (Patient taking differently: Take 50-100 mg by mouth at bedtime.) 180 tablet 0   triamcinolone  (KENALOG ) 0.1 % Apply topically in the morning and at bedtime.     No current facility-administered medications on file prior to visit.   Allergies  Allergen Reactions   Betadine  [Povidone Iodine ] Other (See Comments)    Blister hands and feet     Fentanyl  Shortness Of Breath    Pt has tolerated this medication 06/2015 pre and during operation. MD ok giving.  Low dose only   Morphine  And Codeine Anaphylaxis    respiratory depression    Bupropion Hcl     Dizzy, fog   Codeine Nausea And Vomiting   Demerol [Meperidine] Nausea And Vomiting   Dextromethorphan Diarrhea   Guaifenesin  & Derivatives Diarrhea   Lipitor [Atorvastatin]     Myalgia   Lovaza [Omega-3-Acid Ethyl Esters (Fish)] Diarrhea   Lyrica [Pregabalin]     Fatigue and depression   Naltrexone     depression   Percodan [Oxycodone -Aspirin] Nausea And Vomiting   Polyethylene Glycol (Macrogol)     Blurred vision, headache, and causes bright red skin irritation if on skin.   Prilosec [Omeprazole]     Hx ileus   Topamax [Topiramate]      disorientation    Valium  [Diazepam ]     Small doses are OK but large doses are not. Long-term use causes depression.    Versed  [Midazolam ]     In moderate in large doses shortness of breath, and respiratory depression    Adhesive [Tape] Rash    Skin irritation    Concerta  [Methylphenidate ] Palpitations   Doxycycline Rash    Pruritic rash   Lamotrigine Rash   Nickel Rash    Skin irritation    Oxycodone  Nausea And Vomiting and Rash    And depression   Family History  Problem Relation Age of Onset   Ovarian cancer Mother        passed at age 24   Hypertension Mother    Heart disease Father    Kidney disease Father        Kidney cancer   Diabetes type II Father        passed at 63 yrs old   Colon polyps Father    Myasthenia gravis Father        passed from myasthemua gravis   Bipolar disorder Sister        Osteopenia   Bipolar disorder Son    Esophageal cancer Neg Hx    Rectal cancer Neg Hx    Stomach cancer Neg Hx    Colon cancer Neg Hx    PE: BP 120/80   Pulse 78   Ht 4' 11 (1.499 m)   Wt 172 lb (78 kg) Comment: at home  SpO2 96%   BMI 34.74 kg/m  Pt. Weighed at home, refused weighing here. Wt Readings from Last 3 Encounters:  03/26/24 172 lb (78 kg)  10/04/23 173 lb 4.5 oz (78.6 kg)  10/03/23 170 lb (77.1 kg)    Constitutional: overweight, in NAD Eyes:  EOMI, no exophthalmos ENT: no neck masses, no cervical lymphadenopathy Cardiovascular: RRR, No MRG, + mild periankle  edema bilaterally Respiratory: CTA B Musculoskeletal: + deformities (B feet) - see below Skin:no rashes Neurological: no tremor with outstretched hands  ASSESSMENT: 1.  Prediabetes   2.  Idiopathic postprandial syndrome   3.  Hyperlipidemia  4.  Hashimoto's hypothyroidism  5.  High Vitamin D     PLAN:  1. Patient with decreased glucose tolerance since 2002 and HbA1c levels in prediabetic range.  Her highest HbA1c was in 09/2022, at 6.3%.  She tried metformin  before but stopped due  to presumed hypoglycemic episodes.  She started a CGM in 2021 and she mentions that this was eye-opener and demonstrated that her episodes of hypoglycemic symptoms were not, in fact, related to low blood sugars.  When she saw Dr. Altheimer for the first time in 08/2020 she was advised to resume the CGM.  She continues on this.  I previously printed her prescriptions for the freestyle libre and Dexcom CGM but none were covered, so she pays for it OOP. - She was previously interested in a GLP-1 receptor agonist. Her BMI was above 30.  She did not have a family history of medullary thyroid  cancer or personal history of pancreatitis.  I sent a prescription for low-dose Ozempic  to the pharmacy but this was not covered.  Wegovy , however, was covered -At last visit, sugars were excellent, 100% fluctuating within the target range, without hyperglycemic spikes or hypoglycemia.  We continued Wegovy .  She lost 14 pounds before last visit on this.  HbA1c at last visit was 6.0%, improved.  She gained 2 pounds since last visit. -CGM interpretation: -At today's visit, we reviewed her CGM downloads: It appears that 100% of values are in target range (goal >70%), while 0% are higher than 180 (goal <25%), and 0% are lower than 70 (goal <4%).  The calculated average blood sugar is 115.  The projected HbA1c for the next 3 months (GMI) is 6.1%. -Reviewing the CGM trends, sugars are excellent, fluctuating within a very narrow range, all within the target range.  She feels that having a glucometer and taking Wegovy  has greatly helped with her blood sugar control.  She is wondering whether she needs an OGTT or C peptide to help persuade the insurance to cover these.  We discussed that an OGTT would likely not help since she does have a diagnosis of prediabetes and a C-peptide would also not likely help because we know she does not have insulin  deficiency based on the excellent blood sugars on her CGM.  However, I would definitely  write a letter for her to see if this helps that she will send me the template. - HbA1c is 6.0% (stable, at goal) - I will see her back in 6 months  2.  Idiopathic postprandial syndrome -Associated with spells with weakness, dizziness, burning sensation in her stomach, resolved after taking glucose -However, these episodes are not associated with low blood sugars.  She occasionally has dizzy spells occurring when her blood sugars are at 90, and she eats to prevent this to happen.  She may have neurologic symptoms when her sugars drop under 70.  We discussed that since these are normal sugars, it appears that she may have just an increase sensitivity to the fluctuations in the blood sugars.  Of note, she saw neurology in the past and no etiology was found for these episodes. MRI was normal.   -No medication is available for this condition, however, we discussed about dietary changes that she can institute to prevent large fluctuations in  blood sugars: Eating low glycemic index foods, mostly focusing on solid foods, rather than semisolid or liquid, no  liquids at the time of the meal but separate them by at least 30 minutes from the meal, and start the meal with protein and fat and end with carbs.  - she takes 1-2 glucose tablets when she feels her sugars are dropping -We previously discussed that her freestyle libre CGM may indicate blood sugars that are 10 to 20 mg/dL lower than actually measured by her glucometer.  Therefore, whenever she suspects a low, she needs to check with her glucometer to make sure that these are real levels. - No significant lows since last visit  3.  Hyperlipidemia - Lipid panel was reviewed from 09/2022: Triglycerides elevated, LDL at goal: Lab Results  Component Value Date   CHOL 193 09/29/2022   HDL 59 09/29/2022   LDLCALC 82 09/29/2022   TRIG 391 (H) 09/29/2022   CHOLHDL 3.3 09/29/2022  - Continues on pravastatin  40 mg daily without side effects.  Vascepa  1 g twice  a day was declined by her insurance but she is paying for it OOP.  She is using the lower dose due to diarrhea. - She is due for another lipid panel -she will return for this fasting  4.  Hashimoto's hypothyroidism - latest thyroid  labs reviewed with pt. >> normal: Lab Results  Component Value Date   TSH 1.55 09/26/2023  - she continues on LT4 50 mcg 5/7 days but 100 g 1/7 days mcg daily - pt feels good on this dose. - we discussed about taking the thyroid  hormone every day, with water, >30 minutes before breakfast, separated by >4 hours from acid reflux medications, calcium, iron , multivitamins. Pt. is taking it correctly. - Will recheck the levels at next visit  5.  High vitamin D  level -She had 2 instances of high vitamin D  level, at 100 in 10/2020 and >120 in 12/2021 -She was prev. on ergocalciferol  50,000 units every 2 weeks but will change to vitamin D  only multivitamin.  On this dose, her vitamin D  level normalized: Lab Results  Component Value Date   VD25OH 64.19 01/31/2023  - Will continue with the above dose for now and check another vitamin D  level when she returns for the lipid panel  Lela Fendt, MD PhD [facility] Endocrinology

## 2024-03-27 ENCOUNTER — Other Ambulatory Visit (HOSPITAL_COMMUNITY): Payer: Self-pay

## 2024-03-28 ENCOUNTER — Telehealth: Payer: Self-pay

## 2024-03-28 ENCOUNTER — Other Ambulatory Visit (HOSPITAL_COMMUNITY): Payer: Self-pay

## 2024-03-28 NOTE — Telephone Encounter (Signed)
 Pharmacy Patient Advocate Encounter   Received notification from RX Request Messages that prior authorization for Wegovy  0.25mg /0.29ml is required/requested.   Insurance verification completed.   The patient is insured through [employer].   Per test claim: PA required and submitted KEY/EOC/Request #: [id number] due to This medication is excluded from the patient's benefit

## 2024-03-29 NOTE — Telephone Encounter (Signed)
 J, Can you please let her know?  It does not look like her insurance is covering it.  She can obtain it without insurance, as I believe she is doing now.

## 2024-04-09 ENCOUNTER — Other Ambulatory Visit

## 2024-04-16 NOTE — Telephone Encounter (Signed)
 Patient called back and would like a prescription for Wegovy  pill to be sent to [facility]. She stated that this was discussed previously that the pill will be cheaper for her. Please advise?

## 2024-04-17 ENCOUNTER — Ambulatory Visit

## 2024-04-17 ENCOUNTER — Encounter: Payer: Self-pay | Admitting: Internal Medicine

## 2024-04-18 ENCOUNTER — Other Ambulatory Visit: Payer: Self-pay | Admitting: Internal Medicine

## 2024-04-18 MED ORDER — SEMAGLUTIDE 7 MG PO TABS: ORAL_TABLET | ORAL | 3 refills | Status: AC

## 2024-04-19 ENCOUNTER — Telehealth: Payer: Self-pay

## 2024-04-19 ENCOUNTER — Other Ambulatory Visit (HOSPITAL_COMMUNITY): Payer: Self-pay

## 2024-04-19 NOTE — Telephone Encounter (Signed)
 Pharmacy Patient Advocate Encounter   Received notification from RX Request Messages that prior authorization for Wegovy  0.25mg /0.34ml is required/requested.   Insurance verification completed.   The patient is insured through [employer].   Per test claim: Per test claim, medication is not covered due to plan/benefit exclusion, PA not submitted at this time   Tired to submit a prior authorization and it states drug is not covered by plan.

## 2024-04-19 NOTE — Telephone Encounter (Signed)
 error

## 2024-04-24 ENCOUNTER — Other Ambulatory Visit

## 2024-05-01 ENCOUNTER — Ambulatory Visit
Admission: RE | Admit: 2024-05-01 | Discharge: 2024-05-01 | Disposition: A | Source: Ambulatory Visit | Attending: Obstetrics and Gynecology | Admitting: Obstetrics and Gynecology

## 2024-05-01 ENCOUNTER — Other Ambulatory Visit

## 2024-05-01 DIAGNOSIS — Z1231 Encounter for screening mammogram for malignant neoplasm of breast: Secondary | ICD-10-CM

## 2024-05-02 ENCOUNTER — Ambulatory Visit: Payer: Self-pay | Admitting: Internal Medicine

## 2024-05-02 ENCOUNTER — Other Ambulatory Visit: Payer: Self-pay

## 2024-05-02 LAB — LIPID PANEL W/REFLEX DIRECT LDL
Cholesterol: 156 mg/dL
HDL: 70 mg/dL
LDL Cholesterol (Calc): 57 mg/dL
Non-HDL Cholesterol (Calc): 86 mg/dL
Total CHOL/HDL Ratio: 2.2 (calc)
Triglycerides: 219 mg/dL — ABNORMAL HIGH

## 2024-05-02 LAB — VITAMIN D 25 HYDROXY (VIT D DEFICIENCY, FRACTURES): Vit D, 25-Hydroxy: 54 ng/mL (ref 30–100)

## 2024-05-02 MED ORDER — ICOSAPENT ETHYL 1 G PO CAPS: 1.0000 g | ORAL_CAPSULE | Freq: Two times a day (BID) | ORAL | 14 refills | Status: AC

## 2024-05-02 NOTE — Telephone Encounter (Signed)
 Copied from CRM #[id number]. Topic: Clinical - Medication Refill >> May 02, 2024  2:29 PM Alexandria E wrote: Medication: icosapent  Ethyl (VASCEPA ) 1 g capsule  Has the patient contacted their pharmacy? Yes (Agent: If no, request that the patient contact the pharmacy for the refill. If patient does not wish to contact the pharmacy document the reason why and proceed with request.) (Agent: If yes, when and what did the pharmacy advise?)  This is the patient's preferred pharmacy:  [id number] - [place], [place] - [address] [place] [address] Phone: [phone] Fax: [phone]  Is this the correct pharmacy for this prescription? Yes If no, delete pharmacy and type the correct one.   Has the prescription been filled recently? No  Is the patient out of the medication? Yes  Has the patient been seen for an appointment in the last year OR does the patient have an upcoming appointment? Yes  Can we respond through MyChart? Yes  Agent: Please be advised that Rx refills may take up to 3 business days. We ask that you follow-up with your pharmacy.

## 2024-05-09 ENCOUNTER — Telehealth: Payer: Self-pay | Admitting: Internal Medicine

## 2024-05-09 MED ORDER — ALBUTEROL SULFATE HFA 108 (90 BASE) MCG/ACT IN AERS: 2.0000 | INHALATION_SPRAY | RESPIRATORY_TRACT | 11 refills | Status: AC | PRN

## 2024-05-14 ENCOUNTER — Telehealth: Admitting: Internal Medicine

## 2024-05-14 ENCOUNTER — Ambulatory Visit: Payer: Self-pay

## 2024-05-14 ENCOUNTER — Encounter: Payer: Self-pay | Admitting: Internal Medicine

## 2024-05-14 DIAGNOSIS — J22 Unspecified acute lower respiratory infection: Secondary | ICD-10-CM | POA: Diagnosis not present

## 2024-05-14 MED ORDER — AZITHROMYCIN 250 MG PO TABS: ORAL_TABLET | ORAL | 0 refills | Status: AC

## 2024-05-14 MED ORDER — BENZONATATE 100 MG PO CAPS: 100.0000 mg | ORAL_CAPSULE | Freq: Three times a day (TID) | ORAL | 1 refills | Status: AC | PRN

## 2024-05-14 NOTE — Telephone Encounter (Signed)
 FYI Only or Action Required?: Action required by provider: clinical question for provider.  Patient was last seen in primary care on 06/08/2023 by Rita Ronal PARAS, MD.  Called Nurse Triage reporting Shortness of Breath, Cough, and Wheezing.  Symptoms began several weeks ago.  Interventions attempted: Nothing.  Symptoms are: gradually improving.  Triage Disposition: See HCP Within 4 Hours (Or PCP Triage)  Patient/caregiver understands and will follow disposition?: No, wishes to speak with PCP                Message from Avram MATSU sent at 05/14/2024  1:08 PM EST  Reason for Triage: flu for 10 days and is requesting a zpack. Frequent severe coughing, yellowish mucus. Sore muscles and slightly out of breath.   Reason for Disposition  [1] MILD difficulty breathing (e.g., minimal/no SOB at rest, SOB with walking, pulse < 100) AND [2] still present when not coughing  Answer Assessment - Initial Assessment Questions Husband also sick with same symptoms, he is treating himself with Z pack and self diagnosed them with the flu. Did not test for the flu and no known exposure. RN advised for respiratory symptoms to be evaluated by provider with in office visit or virtual visit. She declines and states, She (Dr Rita) knows us  very well and trusts us . If I have to, then I will. I don't want to take up her time and is requesting PCP to send in a Z pack for her.  1. ONSET: When did the cough begin?      05/03/24  2. SEVERITY: How bad is the cough today?      productive cough   3. SPUTUM: Describe the color of your sputum (e.g., none, dry cough; clear, white, yellow, green)     (clear to yellow mucous)  4. HEMOPTYSIS: Are you coughing up any blood? If Yes, ask: How much? (e.g., flecks, streaks, tablespoons, etc.)     No.  5. DIFFICULTY BREATHING: Are you having difficulty breathing? If Yes, ask: How bad is it? (e.g., mild, moderate, severe)      SOB with exertion,  unable to sleep lying flat. Wheezing, improves after albuterol  inhaler use.  6. FEVER: Do you have a fever? If Yes, ask: What is your temperature, how was it measured, and when did it start?     No.  7. CARDIAC HISTORY: Do you have any history of heart disease? (e.g., heart attack, congestive heart failure)      PFO.  8. LUNG HISTORY: Do you have any history of lung disease?  (e.g., pulmonary embolus, asthma, emphysema)     No.  9. PE RISK FACTORS: Do you have a history of blood clots? (or: recent major surgery, recent prolonged travel, bedridden)     No.  10. OTHER SYMPTOMS: Do you have any other symptoms? (e.g., runny nose, wheezing, chest pain)       Chest sore from coughing, dizzy, headache, hoarse voice.  Protocols used: Cough - Acute Productive-A-AH

## 2024-05-14 NOTE — Patient Instructions (Addendum)
 We are sorry you are not feeling well.Please take Zithromax  2 tabs day 1 followed by one tab days 2-5. May take Tessalon  perles  3 times daily as needed for cough.

## 2024-05-14 NOTE — Telephone Encounter (Signed)
 Done

## 2024-10-08 ENCOUNTER — Ambulatory Visit: Admitting: Internal Medicine
